# Patient Record
Sex: Female | Born: 1970 | Race: Black or African American | Hispanic: No | Marital: Married | Smoking: Never smoker
Health system: Southern US, Community
[De-identification: ages and names within clinical notes are randomized; demographics above are authoritative.]

## PROBLEM LIST (undated history)

## (undated) DIAGNOSIS — J189 Pneumonia, unspecified organism: Secondary | ICD-10-CM

## (undated) DIAGNOSIS — K220 Achalasia of cardia: Secondary | ICD-10-CM

## (undated) DIAGNOSIS — F329 Major depressive disorder, single episode, unspecified: Secondary | ICD-10-CM

## (undated) DIAGNOSIS — F419 Anxiety disorder, unspecified: Secondary | ICD-10-CM

## (undated) DIAGNOSIS — M6740 Ganglion, unspecified site: Secondary | ICD-10-CM

## (undated) DIAGNOSIS — G43909 Migraine, unspecified, not intractable, without status migrainosus: Secondary | ICD-10-CM

## (undated) DIAGNOSIS — A630 Anogenital (venereal) warts: Secondary | ICD-10-CM

## (undated) DIAGNOSIS — D259 Leiomyoma of uterus, unspecified: Secondary | ICD-10-CM

## (undated) DIAGNOSIS — F32A Depression, unspecified: Secondary | ICD-10-CM

## (undated) DIAGNOSIS — F319 Bipolar disorder, unspecified: Secondary | ICD-10-CM

## (undated) DIAGNOSIS — M51369 Other intervertebral disc degeneration, lumbar region without mention of lumbar back pain or lower extremity pain: Secondary | ICD-10-CM

## (undated) DIAGNOSIS — N939 Abnormal uterine and vaginal bleeding, unspecified: Secondary | ICD-10-CM

## (undated) DIAGNOSIS — A6000 Herpesviral infection of urogenital system, unspecified: Secondary | ICD-10-CM

## (undated) DIAGNOSIS — R7303 Prediabetes: Secondary | ICD-10-CM

## (undated) DIAGNOSIS — A599 Trichomoniasis, unspecified: Secondary | ICD-10-CM

## (undated) DIAGNOSIS — Z21 Asymptomatic human immunodeficiency virus [HIV] infection status: Secondary | ICD-10-CM

## (undated) DIAGNOSIS — G8929 Other chronic pain: Secondary | ICD-10-CM

## (undated) DIAGNOSIS — D241 Benign neoplasm of right breast: Secondary | ICD-10-CM

## (undated) DIAGNOSIS — B20 Human immunodeficiency virus [HIV] disease: Secondary | ICD-10-CM

## (undated) DIAGNOSIS — K759 Inflammatory liver disease, unspecified: Secondary | ICD-10-CM

## (undated) DIAGNOSIS — M549 Dorsalgia, unspecified: Secondary | ICD-10-CM

## (undated) DIAGNOSIS — M5136 Other intervertebral disc degeneration, lumbar region: Secondary | ICD-10-CM

## (undated) DIAGNOSIS — Z973 Presence of spectacles and contact lenses: Secondary | ICD-10-CM

## (undated) DIAGNOSIS — K219 Gastro-esophageal reflux disease without esophagitis: Secondary | ICD-10-CM

## (undated) DIAGNOSIS — R32 Unspecified urinary incontinence: Secondary | ICD-10-CM

## (undated) DIAGNOSIS — M5126 Other intervertebral disc displacement, lumbar region: Secondary | ICD-10-CM

## (undated) DIAGNOSIS — D649 Anemia, unspecified: Secondary | ICD-10-CM

## (undated) HISTORY — DX: Other intervertebral disc degeneration, lumbar region without mention of lumbar back pain or lower extremity pain: M51.369

## (undated) HISTORY — DX: Gastro-esophageal reflux disease without esophagitis: K21.9

## (undated) HISTORY — DX: Pneumonia, unspecified organism: J18.9

## (undated) HISTORY — DX: Anxiety disorder, unspecified: F41.9

## (undated) HISTORY — DX: Other intervertebral disc displacement, lumbar region: M51.26

## (undated) HISTORY — DX: Other intervertebral disc degeneration, lumbar region: M51.36

## (undated) HISTORY — DX: Migraine, unspecified, not intractable, without status migrainosus: G43.909

## (undated) HISTORY — DX: Human immunodeficiency virus (HIV) disease: B20

## (undated) HISTORY — DX: Depression, unspecified: F32.A

## (undated) HISTORY — DX: Asymptomatic human immunodeficiency virus (hiv) infection status: Z21

## (undated) HISTORY — DX: Major depressive disorder, single episode, unspecified: F32.9

## (undated) HISTORY — PX: HERNIA REPAIR: SHX51

## (undated) HISTORY — DX: Anemia, unspecified: D64.9

## (undated) HISTORY — PX: WISDOM TOOTH EXTRACTION: SHX21

## (undated) HISTORY — DX: Other chronic pain: G89.29

## (undated) HISTORY — PX: OTHER SURGICAL HISTORY: SHX169

## (undated) HISTORY — DX: Dorsalgia, unspecified: M54.9

---

## 2004-08-31 ENCOUNTER — Ambulatory Visit: Payer: Self-pay | Admitting: Family Medicine

## 2004-10-05 ENCOUNTER — Ambulatory Visit: Payer: Self-pay | Admitting: Family Medicine

## 2004-11-02 ENCOUNTER — Ambulatory Visit: Payer: Self-pay | Admitting: Family Medicine

## 2004-11-30 ENCOUNTER — Ambulatory Visit: Payer: Self-pay | Admitting: Family Medicine

## 2005-02-01 ENCOUNTER — Ambulatory Visit: Payer: Self-pay | Admitting: Family Medicine

## 2005-02-27 ENCOUNTER — Ambulatory Visit: Payer: Self-pay | Admitting: Internal Medicine

## 2005-04-09 ENCOUNTER — Ambulatory Visit: Payer: Self-pay | Admitting: Family Medicine

## 2005-05-28 ENCOUNTER — Other Ambulatory Visit: Admission: RE | Admit: 2005-05-28 | Discharge: 2005-05-28 | Payer: Self-pay | Admitting: Family Medicine

## 2005-05-28 ENCOUNTER — Ambulatory Visit: Payer: Self-pay | Admitting: Family Medicine

## 2005-06-08 ENCOUNTER — Ambulatory Visit: Payer: Self-pay | Admitting: Family Medicine

## 2005-07-05 ENCOUNTER — Ambulatory Visit: Payer: Self-pay | Admitting: Family Medicine

## 2005-08-16 ENCOUNTER — Ambulatory Visit: Payer: Self-pay | Admitting: Family Medicine

## 2005-08-28 ENCOUNTER — Ambulatory Visit: Payer: Self-pay | Admitting: Family Medicine

## 2005-09-12 ENCOUNTER — Ambulatory Visit: Payer: Self-pay | Admitting: Family Medicine

## 2005-10-02 ENCOUNTER — Ambulatory Visit: Payer: Self-pay | Admitting: Family Medicine

## 2005-10-26 ENCOUNTER — Ambulatory Visit: Payer: Self-pay | Admitting: Family Medicine

## 2005-11-01 ENCOUNTER — Ambulatory Visit: Payer: Self-pay | Admitting: Family Medicine

## 2005-11-09 ENCOUNTER — Ambulatory Visit: Payer: Self-pay | Admitting: Family Medicine

## 2006-01-04 ENCOUNTER — Other Ambulatory Visit: Admission: RE | Admit: 2006-01-04 | Discharge: 2006-01-04 | Payer: Self-pay | Admitting: Family Medicine

## 2006-01-04 ENCOUNTER — Ambulatory Visit: Payer: Self-pay | Admitting: *Deleted

## 2006-01-18 ENCOUNTER — Ambulatory Visit: Payer: Self-pay | Admitting: *Deleted

## 2006-02-01 ENCOUNTER — Ambulatory Visit: Payer: Self-pay | Admitting: *Deleted

## 2006-02-12 ENCOUNTER — Ambulatory Visit: Payer: Self-pay | Admitting: Family Medicine

## 2006-04-23 ENCOUNTER — Ambulatory Visit: Payer: Self-pay | Admitting: Family Medicine

## 2006-04-24 ENCOUNTER — Ambulatory Visit (HOSPITAL_COMMUNITY): Admission: RE | Admit: 2006-04-24 | Discharge: 2006-04-24 | Payer: Self-pay | Admitting: Internal Medicine

## 2006-05-16 ENCOUNTER — Ambulatory Visit: Payer: Self-pay | Admitting: Internal Medicine

## 2006-06-25 ENCOUNTER — Ambulatory Visit: Payer: Self-pay | Admitting: Family Medicine

## 2006-08-22 ENCOUNTER — Encounter: Payer: Self-pay | Admitting: Obstetrics and Gynecology

## 2006-08-22 ENCOUNTER — Ambulatory Visit: Payer: Self-pay | Admitting: *Deleted

## 2007-01-02 ENCOUNTER — Ambulatory Visit: Payer: Self-pay | Admitting: Internal Medicine

## 2007-01-07 ENCOUNTER — Ambulatory Visit (HOSPITAL_COMMUNITY): Admission: RE | Admit: 2007-01-07 | Discharge: 2007-01-07 | Payer: Self-pay | Admitting: Internal Medicine

## 2007-01-30 ENCOUNTER — Ambulatory Visit: Payer: Self-pay | Admitting: Internal Medicine

## 2007-02-12 ENCOUNTER — Encounter: Admission: RE | Admit: 2007-02-12 | Discharge: 2007-05-13 | Payer: Self-pay | Admitting: Internal Medicine

## 2007-03-24 DIAGNOSIS — B20 Human immunodeficiency virus [HIV] disease: Secondary | ICD-10-CM

## 2007-03-24 DIAGNOSIS — J45909 Unspecified asthma, uncomplicated: Secondary | ICD-10-CM | POA: Insufficient documentation

## 2007-03-24 DIAGNOSIS — F411 Generalized anxiety disorder: Secondary | ICD-10-CM

## 2007-03-27 ENCOUNTER — Ambulatory Visit: Payer: Self-pay | Admitting: Internal Medicine

## 2007-04-24 ENCOUNTER — Ambulatory Visit: Payer: Self-pay | Admitting: Internal Medicine

## 2007-10-09 ENCOUNTER — Ambulatory Visit: Payer: Self-pay | Admitting: Internal Medicine

## 2007-10-09 LAB — CONVERTED CEMR LAB
ALT: 14 units/L (ref 0–35)
AST: 18 units/L (ref 0–37)
Absolute CD4: 506 #/uL (ref 381–1469)
Albumin: 4.4 g/dL (ref 3.5–5.2)
Alkaline Phosphatase: 90 units/L (ref 39–117)
BUN: 11 mg/dL (ref 6–23)
Basophils Absolute: 0 10*3/uL (ref 0.0–0.1)
Basophils Relative: 0 % (ref 0–1)
CD4 T Helper %: 25 % — ABNORMAL LOW (ref 32–62)
CO2: 25 meq/L (ref 19–32)
Calcium: 9.2 mg/dL (ref 8.4–10.5)
Chlamydia, Swab/Urine, PCR: NEGATIVE
Chloride: 106 meq/L (ref 96–112)
Creatinine, Ser: 0.94 mg/dL (ref 0.40–1.20)
Eosinophils Absolute: 0.2 10*3/uL (ref 0.0–0.7)
Eosinophils Relative: 3 % (ref 0–5)
GC Probe Amp, Urine: NEGATIVE
Glucose, Bld: 100 mg/dL — ABNORMAL HIGH (ref 70–99)
HCT: 33.5 % — ABNORMAL LOW (ref 36.0–46.0)
HIV 1 RNA Quant: <50 copies/mL (ref <50)
HIV-1 RNA Quant, Log: <1.7 (ref <1.70)
Hemoglobin: 9.9 g/dL — ABNORMAL LOW (ref 12.0–15.0)
Lymphocytes Relative: 44 % (ref 12–46)
Lymphs Abs: 2 10*3/uL (ref 0.7–4.0)
MCHC: 29.6 g/dL — ABNORMAL LOW (ref 30.0–36.0)
MCV: 78.3 fL (ref 78.0–100.0)
Monocytes Absolute: 0.4 10*3/uL (ref 0.1–1.0)
Monocytes Relative: 8 % (ref 3–12)
Neutro Abs: 2 10*3/uL (ref 1.7–7.7)
Neutrophils Relative %: 44 % (ref 43–77)
Platelets: 289 10*3/uL (ref 150–400)
Potassium: 3.7 meq/L (ref 3.5–5.3)
RBC: 4.28 M/uL (ref 3.87–5.11)
RDW: 16.8 % — ABNORMAL HIGH (ref 11.5–15.5)
Sodium: 139 meq/L (ref 135–145)
Total Bilirubin: 1.1 mg/dL (ref 0.3–1.2)
Total Lymphocytes %: 44 % (ref 12–46)
Total Protein: 7.9 g/dL (ref 6.0–8.3)
Total lymphocyte count: 2024 cells/mcL (ref 700–3300)
WBC, lymph enumeration: 4.6 10*3/uL (ref 4.0–10.5)
WBC: 4.6 10*3/uL (ref 4.0–10.5)

## 2007-11-09 ENCOUNTER — Emergency Department (HOSPITAL_COMMUNITY): Admission: EM | Admit: 2007-11-09 | Discharge: 2007-11-09 | Payer: Self-pay | Admitting: Family Medicine

## 2007-11-27 ENCOUNTER — Ambulatory Visit: Payer: Self-pay | Admitting: Internal Medicine

## 2007-11-28 ENCOUNTER — Ambulatory Visit: Payer: Self-pay | Admitting: *Deleted

## 2007-11-30 ENCOUNTER — Emergency Department (HOSPITAL_COMMUNITY): Admission: EM | Admit: 2007-11-30 | Discharge: 2007-11-30 | Payer: Self-pay | Admitting: Family Medicine

## 2008-01-01 ENCOUNTER — Ambulatory Visit: Payer: Self-pay | Admitting: Internal Medicine

## 2008-01-01 ENCOUNTER — Encounter: Payer: Self-pay | Admitting: Internal Medicine

## 2008-01-01 LAB — CONVERTED CEMR LAB
ALT: 14 units/L (ref 0–35)
AST: 19 units/L (ref 0–37)
Absolute CD4: 414 #/uL (ref 381–1469)
Albumin: 4.5 g/dL (ref 3.5–5.2)
Alkaline Phosphatase: 101 units/L (ref 39–117)
BUN: 13 mg/dL (ref 6–23)
Basophils Absolute: 0 10*3/uL (ref 0.0–0.1)
Basophils Relative: 1 % (ref 0–1)
CD4 T Helper %: 31 % — ABNORMAL LOW (ref 32–62)
CO2: 21 meq/L (ref 19–32)
Calcium: 9.8 mg/dL (ref 8.4–10.5)
Chlamydia, Swab/Urine, PCR: NEGATIVE
Chloride: 102 meq/L (ref 96–112)
Creatinine, Ser: 1.03 mg/dL (ref 0.40–1.20)
Eosinophils Absolute: 0.1 10*3/uL (ref 0.0–0.7)
Eosinophils Relative: 1 % (ref 0–5)
GC Probe Amp, Urine: NEGATIVE
Glucose, Bld: 130 mg/dL — ABNORMAL HIGH (ref 70–99)
HCT: 31.8 % — ABNORMAL LOW (ref 36.0–46.0)
HIV 1 RNA Quant: <50 copies/mL (ref <50)
HIV-1 RNA Quant, Log: <1.7 (ref <1.70)
Hemoglobin: 9.1 g/dL — ABNORMAL LOW (ref 12.0–15.0)
Lymphocytes Relative: 29 % (ref 12–46)
Lymphs Abs: 1.3 10*3/uL (ref 0.7–4.0)
MCHC: 28.6 g/dL — ABNORMAL LOW (ref 30.0–36.0)
MCV: 75.5 fL — ABNORMAL LOW (ref 78.0–100.0)
Monocytes Absolute: 0.5 10*3/uL (ref 0.1–1.0)
Monocytes Relative: 10 % (ref 3–12)
Neutro Abs: 2.7 10*3/uL (ref 1.7–7.7)
Neutrophils Relative %: 59 % (ref 43–77)
Platelets: 291 10*3/uL (ref 150–400)
Potassium: 3.7 meq/L (ref 3.5–5.3)
RBC: 4.21 M/uL (ref 3.87–5.11)
RDW: 15.8 % — ABNORMAL HIGH (ref 11.5–15.5)
Sodium: 141 meq/L (ref 135–145)
Total Bilirubin: 2.3 mg/dL — ABNORMAL HIGH (ref 0.3–1.2)
Total Lymphocytes %: 29 % (ref 12–46)
Total Protein: 8.3 g/dL (ref 6.0–8.3)
Total lymphocyte count: 1334 cells/mcL (ref 700–3300)
WBC, lymph enumeration: 4.6 10*3/uL (ref 4.0–10.5)
WBC: 4.6 10*3/uL (ref 4.0–10.5)

## 2008-01-13 ENCOUNTER — Ambulatory Visit: Payer: Self-pay | Admitting: Internal Medicine

## 2008-01-29 ENCOUNTER — Ambulatory Visit: Payer: Self-pay | Admitting: Internal Medicine

## 2008-04-01 ENCOUNTER — Encounter: Payer: Self-pay | Admitting: Internal Medicine

## 2008-04-01 ENCOUNTER — Ambulatory Visit: Payer: Self-pay | Admitting: Internal Medicine

## 2008-04-01 LAB — CONVERTED CEMR LAB
ALT: 11 units/L (ref 0–35)
AST: 19 units/L (ref 0–37)
Absolute CD4: 719 #/uL (ref 381–1469)
Albumin: 4.5 g/dL (ref 3.5–5.2)
Alkaline Phosphatase: 76 units/L (ref 39–117)
BUN: 10 mg/dL (ref 6–23)
Basophils Absolute: 0 10*3/uL (ref 0.0–0.1)
Basophils Relative: 1 % (ref 0–1)
CD4 T Helper %: 29 % — ABNORMAL LOW (ref 32–62)
CO2: 25 meq/L (ref 19–32)
Calcium: 9.4 mg/dL (ref 8.4–10.5)
Chloride: 105 meq/L (ref 96–112)
Creatinine, Ser: 0.57 mg/dL (ref 0.40–1.20)
Eosinophils Absolute: 0.1 10*3/uL (ref 0.0–0.7)
Eosinophils Relative: 2 % (ref 0–5)
Glucose, Bld: 90 mg/dL (ref 70–99)
HCT: 29.9 % — ABNORMAL LOW (ref 36.0–46.0)
HIV 1 RNA Quant: <50 copies/mL (ref <50)
HIV-1 RNA Quant, Log: <1.7 (ref <1.70)
Hemoglobin: 8.5 g/dL — ABNORMAL LOW (ref 12.0–15.0)
Lymphocytes Relative: 42 % (ref 12–46)
Lymphs Abs: 2.5 10*3/uL (ref 0.7–4.0)
MCHC: 28.4 g/dL — ABNORMAL LOW (ref 30.0–36.0)
MCV: 71.9 fL — ABNORMAL LOW (ref 78.0–100.0)
Monocytes Absolute: 0.3 10*3/uL (ref 0.1–1.0)
Monocytes Relative: 5 % (ref 3–12)
Neutro Abs: 3 10*3/uL (ref 1.7–7.7)
Neutrophils Relative %: 50 % (ref 43–77)
Platelets: 328 10*3/uL (ref 150–400)
Potassium: 4.2 meq/L (ref 3.5–5.3)
RBC: 4.16 M/uL (ref 3.87–5.11)
RDW: 18.4 % — ABNORMAL HIGH (ref 11.5–15.5)
Sodium: 142 meq/L (ref 135–145)
Total Bilirubin: 0.6 mg/dL (ref 0.3–1.2)
Total Lymphocytes %: 42 % (ref 12–46)
Total Protein: 7.4 g/dL (ref 6.0–8.3)
Total lymphocyte count: 2478 cells/mcL (ref 700–3300)
WBC, lymph enumeration: 5.9 10*3/uL (ref 4.0–10.5)
WBC: 5.9 10*3/uL (ref 4.0–10.5)

## 2008-06-15 ENCOUNTER — Encounter: Admission: RE | Admit: 2008-06-15 | Discharge: 2008-09-13 | Payer: Self-pay | Admitting: Internal Medicine

## 2008-06-17 ENCOUNTER — Ambulatory Visit: Payer: Self-pay | Admitting: Internal Medicine

## 2008-06-17 ENCOUNTER — Encounter: Payer: Self-pay | Admitting: Internal Medicine

## 2008-06-17 LAB — CONVERTED CEMR LAB
ALT: 13 units/L (ref 0–35)
AST: 16 units/L (ref 0–37)
Absolute CD4: 596 #/uL (ref 381–1469)
Albumin: 4.1 g/dL (ref 3.5–5.2)
Alkaline Phosphatase: 110 units/L (ref 39–117)
BUN: 14 mg/dL (ref 6–23)
Basophils Absolute: 0.1 10*3/uL (ref 0.0–0.1)
Basophils Relative: 1 % (ref 0–1)
CD4 T Helper %: 27 % — ABNORMAL LOW (ref 32–62)
CO2: 22 meq/L (ref 19–32)
Calcium: 8.9 mg/dL (ref 8.4–10.5)
Chloride: 105 meq/L (ref 96–112)
Creatinine, Ser: 1 mg/dL (ref 0.40–1.20)
Eosinophils Absolute: 0.1 10*3/uL (ref 0.0–0.7)
Eosinophils Relative: 2 % (ref 0–5)
Glucose, Bld: 80 mg/dL (ref 70–99)
HCT: 32.9 % — ABNORMAL LOW (ref 36.0–46.0)
HIV 1 RNA Quant: <50 copies/mL (ref <50)
HIV-1 RNA Quant, Log: <1.7 (ref <1.70)
Hemoglobin: 9.5 g/dL — ABNORMAL LOW (ref 12.0–15.0)
Lymphocytes Relative: 48 % — ABNORMAL HIGH (ref 12–46)
Lymphs Abs: 2.2 10*3/uL (ref 0.7–4.0)
MCHC: 28.9 g/dL — ABNORMAL LOW (ref 30.0–36.0)
MCV: 77.2 fL — ABNORMAL LOW (ref 78.0–100.0)
Monocytes Absolute: 0.4 10*3/uL (ref 0.1–1.0)
Monocytes Relative: 8 % (ref 3–12)
Neutro Abs: 1.9 10*3/uL (ref 1.7–7.7)
Neutrophils Relative %: 41 % — ABNORMAL LOW (ref 43–77)
Platelets: 259 10*3/uL (ref 150–400)
Potassium: 4 meq/L (ref 3.5–5.3)
RBC: 4.26 M/uL (ref 3.87–5.11)
RDW: 22.4 % — ABNORMAL HIGH (ref 11.5–15.5)
Sodium: 138 meq/L (ref 135–145)
Total Bilirubin: 0.9 mg/dL (ref 0.3–1.2)
Total Lymphocytes %: 48 % — ABNORMAL HIGH (ref 12–46)
Total Protein: 7.3 g/dL (ref 6.0–8.3)
Total lymphocyte count: 2208 cells/mcL (ref 700–3300)
WBC, lymph enumeration: 4.6 10*3/uL (ref 4.0–10.5)
WBC: 4.6 10*3/uL (ref 4.0–10.5)

## 2008-06-22 ENCOUNTER — Emergency Department (HOSPITAL_COMMUNITY): Admission: EM | Admit: 2008-06-22 | Discharge: 2008-06-22 | Payer: Self-pay | Admitting: Emergency Medicine

## 2008-07-01 ENCOUNTER — Ambulatory Visit: Payer: Self-pay | Admitting: Internal Medicine

## 2008-07-03 ENCOUNTER — Ambulatory Visit (HOSPITAL_COMMUNITY): Admission: RE | Admit: 2008-07-03 | Discharge: 2008-07-03 | Payer: Self-pay | Admitting: Internal Medicine

## 2008-09-20 ENCOUNTER — Ambulatory Visit: Payer: Self-pay | Admitting: Internal Medicine

## 2008-09-21 ENCOUNTER — Encounter: Payer: Self-pay | Admitting: Internal Medicine

## 2008-09-21 LAB — CONVERTED CEMR LAB
ALT: 15 units/L (ref 0–35)
AST: 15 units/L (ref 0–37)
Absolute CD4: 552 #/uL (ref 381–1469)
Albumin: 4.2 g/dL (ref 3.5–5.2)
Alkaline Phosphatase: 117 units/L (ref 39–117)
BUN: 12 mg/dL (ref 6–23)
Basophils Absolute: 0.1 10*3/uL (ref 0.0–0.1)
Basophils Relative: 1 % (ref 0–1)
CD4 T Helper %: 23 % — ABNORMAL LOW (ref 32–62)
CO2: 24 meq/L (ref 19–32)
Calcium: 9.1 mg/dL (ref 8.4–10.5)
Chloride: 105 meq/L (ref 96–112)
Creatinine, Ser: 0.9 mg/dL (ref 0.40–1.20)
Eosinophils Absolute: 0.1 10*3/uL (ref 0.0–0.7)
Eosinophils Relative: 3 % (ref 0–5)
Glucose, Bld: 74 mg/dL (ref 70–99)
HCT: 37.8 % (ref 36.0–46.0)
HIV 1 RNA Quant: <48 copies/mL (ref <48)
HIV-1 RNA Quant, Log: <1.68 (ref <1.68)
Hemoglobin: 11.9 g/dL — ABNORMAL LOW (ref 12.0–15.0)
Lymphocytes Relative: 48 % — ABNORMAL HIGH (ref 12–46)
Lymphs Abs: 2.4 10*3/uL (ref 0.7–4.0)
MCHC: 31.5 g/dL (ref 30.0–36.0)
MCV: 85.9 fL (ref 78.0–100.0)
Monocytes Absolute: 0.4 10*3/uL (ref 0.1–1.0)
Monocytes Relative: 9 % (ref 3–12)
Neutro Abs: 2 10*3/uL (ref 1.7–7.7)
Neutrophils Relative %: 40 % — ABNORMAL LOW (ref 43–77)
Platelets: 278 10*3/uL (ref 150–400)
Potassium: 4.4 meq/L (ref 3.5–5.3)
RBC: 4.4 M/uL (ref 3.87–5.11)
RDW: 19 % — ABNORMAL HIGH (ref 11.5–15.5)
Sodium: 141 meq/L (ref 135–145)
Total Bilirubin: 1.1 mg/dL (ref 0.3–1.2)
Total Lymphocytes %: 48 % — ABNORMAL HIGH (ref 12–46)
Total Protein: 7.6 g/dL (ref 6.0–8.3)
Total lymphocyte count: 2400 cells/mcL (ref 700–3300)
WBC, lymph enumeration: 5 10*3/uL (ref 4.0–10.5)
WBC: 5 10*3/uL (ref 4.0–10.5)

## 2008-10-04 ENCOUNTER — Ambulatory Visit: Payer: Self-pay | Admitting: Internal Medicine

## 2008-10-12 ENCOUNTER — Ambulatory Visit: Payer: Self-pay | Admitting: Internal Medicine

## 2008-10-18 ENCOUNTER — Telehealth: Payer: Self-pay | Admitting: Internal Medicine

## 2008-10-27 ENCOUNTER — Encounter: Payer: Self-pay | Admitting: Internal Medicine

## 2008-10-28 ENCOUNTER — Encounter: Payer: Self-pay | Admitting: Internal Medicine

## 2008-10-28 ENCOUNTER — Ambulatory Visit: Payer: Self-pay | Admitting: Internal Medicine

## 2008-12-09 ENCOUNTER — Ambulatory Visit: Payer: Self-pay | Admitting: Internal Medicine

## 2008-12-09 LAB — CONVERTED CEMR LAB
ALT: 11 units/L (ref 0–35)
AST: 14 units/L (ref 0–37)
Absolute CD4: 644 #/uL (ref 381–1469)
Albumin: 4.4 g/dL (ref 3.5–5.2)
Alkaline Phosphatase: 99 units/L (ref 39–117)
BUN: 19 mg/dL (ref 6–23)
Basophils Absolute: 0 10*3/uL (ref 0.0–0.1)
Basophils Relative: 1 % (ref 0–1)
CD4 T Helper %: 26 % — ABNORMAL LOW (ref 32–62)
CO2: 22 meq/L (ref 19–32)
Calcium: 9.5 mg/dL (ref 8.4–10.5)
Chloride: 105 meq/L (ref 96–112)
Creatinine, Ser: 1.02 mg/dL (ref 0.40–1.20)
Eosinophils Absolute: 0.1 10*3/uL (ref 0.0–0.7)
Eosinophils Relative: 2 % (ref 0–5)
Glucose, Bld: 93 mg/dL (ref 70–99)
HCT: 39.6 % (ref 36.0–46.0)
HIV 1 RNA Quant: <48 copies/mL (ref <48)
HIV-1 RNA Quant, Log: <1.68 (ref <1.68)
Hemoglobin: 12.1 g/dL (ref 12.0–15.0)
Lymphocytes Relative: 42 % (ref 12–46)
Lymphs Abs: 2.5 10*3/uL (ref 0.7–4.0)
MCHC: 30.6 g/dL (ref 30.0–36.0)
MCV: 92.5 fL (ref 78.0–100.0)
Monocytes Absolute: 0.5 10*3/uL (ref 0.1–1.0)
Monocytes Relative: 8 % (ref 3–12)
Neutro Abs: 2.9 10*3/uL (ref 1.7–7.7)
Neutrophils Relative %: 49 % (ref 43–77)
Platelets: 242 10*3/uL (ref 150–400)
Potassium: 4 meq/L (ref 3.5–5.3)
RBC: 4.28 M/uL (ref 3.87–5.11)
RDW: 15.4 % (ref 11.5–15.5)
Sodium: 137 meq/L (ref 135–145)
Total Bilirubin: 1.5 mg/dL — ABNORMAL HIGH (ref 0.3–1.2)
Total Lymphocytes %: 42 % (ref 12–46)
Total Protein: 7.7 g/dL (ref 6.0–8.3)
Total lymphocyte count: 2478 cells/mcL (ref 700–3300)
WBC, lymph enumeration: 5.9 10*3/uL (ref 4.0–10.5)
WBC: 5.9 10*3/uL (ref 4.0–10.5)

## 2008-12-23 ENCOUNTER — Encounter: Payer: Self-pay | Admitting: Internal Medicine

## 2008-12-23 ENCOUNTER — Ambulatory Visit: Payer: Self-pay | Admitting: Internal Medicine

## 2008-12-25 ENCOUNTER — Encounter: Payer: Self-pay | Admitting: Internal Medicine

## 2009-01-14 ENCOUNTER — Encounter: Payer: Self-pay | Admitting: Internal Medicine

## 2009-01-28 ENCOUNTER — Telehealth: Payer: Self-pay | Admitting: Internal Medicine

## 2009-02-10 ENCOUNTER — Ambulatory Visit: Payer: Self-pay | Admitting: Internal Medicine

## 2009-02-10 ENCOUNTER — Encounter: Payer: Self-pay | Admitting: Internal Medicine

## 2009-02-10 DIAGNOSIS — F329 Major depressive disorder, single episode, unspecified: Secondary | ICD-10-CM

## 2009-03-10 ENCOUNTER — Ambulatory Visit: Payer: Self-pay | Admitting: Internal Medicine

## 2009-03-10 ENCOUNTER — Encounter: Payer: Self-pay | Admitting: Internal Medicine

## 2009-03-11 ENCOUNTER — Ambulatory Visit (HOSPITAL_COMMUNITY): Admission: RE | Admit: 2009-03-11 | Discharge: 2009-03-11 | Payer: Self-pay | Admitting: Internal Medicine

## 2009-03-16 ENCOUNTER — Encounter: Payer: Self-pay | Admitting: Internal Medicine

## 2009-03-16 ENCOUNTER — Ambulatory Visit: Payer: Self-pay | Admitting: Internal Medicine

## 2009-03-16 LAB — CONVERTED CEMR LAB
ALT: 17 units/L (ref 0–35)
AST: 19 units/L (ref 0–37)
Absolute CD4: 780 #/uL (ref 381–1469)
Albumin: 4.4 g/dL (ref 3.5–5.2)
Alkaline Phosphatase: 106 units/L (ref 39–117)
BUN: 15 mg/dL (ref 6–23)
Basophils Absolute: 0.1 10*3/uL (ref 0.0–0.1)
Basophils Relative: 1 % (ref 0–1)
CD4 T Helper %: 32 % (ref 32–62)
CO2: 21 meq/L (ref 19–32)
Calcium: 9.4 mg/dL (ref 8.4–10.5)
Chloride: 105 meq/L (ref 96–112)
Creatinine, Ser: 0.96 mg/dL (ref 0.40–1.20)
Eosinophils Absolute: 0.1 10*3/uL (ref 0.0–0.7)
Eosinophils Relative: 2 % (ref 0–5)
Glucose, Bld: 80 mg/dL (ref 70–99)
HCT: 36.3 % (ref 36.0–46.0)
HIV 1 RNA Quant: <48 copies/mL (ref <48)
HIV-1 RNA Quant, Log: <1.68 (ref <1.68)
Hemoglobin: 11.4 g/dL — ABNORMAL LOW (ref 12.0–15.0)
Lymphocytes Relative: 46 % (ref 12–46)
Lymphs Abs: 2.4 10*3/uL (ref 0.7–4.0)
MCHC: 31.4 g/dL (ref 30.0–36.0)
MCV: 92.6 fL (ref 78.0–100.0)
Monocytes Absolute: 0.4 10*3/uL (ref 0.1–1.0)
Monocytes Relative: 7 % (ref 3–12)
Neutro Abs: 2.4 10*3/uL (ref 1.7–7.7)
Neutrophils Relative %: 45 % (ref 43–77)
Platelets: 277 10*3/uL (ref 150–400)
Potassium: 4 meq/L (ref 3.5–5.3)
RBC: 3.92 M/uL (ref 3.87–5.11)
RDW: 14.7 % (ref 11.5–15.5)
Sodium: 141 meq/L (ref 135–145)
Total Bilirubin: 0.9 mg/dL (ref 0.3–1.2)
Total Protein: 7.9 g/dL (ref 6.0–8.3)
Total lymphocyte count: 2438 cells/mcL (ref 700–3300)
WBC: 5.3 10*3/uL (ref 4.0–10.5)

## 2009-03-18 ENCOUNTER — Ambulatory Visit (HOSPITAL_COMMUNITY): Admission: RE | Admit: 2009-03-18 | Discharge: 2009-03-18 | Payer: Self-pay | Admitting: Internal Medicine

## 2009-03-21 ENCOUNTER — Ambulatory Visit: Payer: Self-pay | Admitting: Internal Medicine

## 2009-03-21 ENCOUNTER — Encounter: Payer: Self-pay | Admitting: Internal Medicine

## 2009-03-23 ENCOUNTER — Encounter: Payer: Self-pay | Admitting: Internal Medicine

## 2009-03-31 ENCOUNTER — Ambulatory Visit: Payer: Self-pay | Admitting: Internal Medicine

## 2009-03-31 ENCOUNTER — Encounter: Payer: Self-pay | Admitting: Internal Medicine

## 2009-04-07 ENCOUNTER — Encounter: Payer: Self-pay | Admitting: Internal Medicine

## 2009-04-07 ENCOUNTER — Telehealth: Payer: Self-pay | Admitting: Internal Medicine

## 2009-05-10 ENCOUNTER — Ambulatory Visit: Payer: Self-pay | Admitting: Internal Medicine

## 2009-05-10 ENCOUNTER — Encounter: Payer: Self-pay | Admitting: Internal Medicine

## 2009-05-31 ENCOUNTER — Telehealth: Payer: Self-pay | Admitting: Internal Medicine

## 2009-06-16 ENCOUNTER — Ambulatory Visit: Payer: Self-pay | Admitting: Internal Medicine

## 2009-06-16 LAB — CONVERTED CEMR LAB
ALT: 14 units/L (ref 0–35)
AST: 15 units/L (ref 0–37)
Absolute CD4: 721 #/uL (ref 381–1469)
Albumin: 4 g/dL (ref 3.5–5.2)
Alkaline Phosphatase: 106 units/L (ref 39–117)
BUN: 13 mg/dL (ref 6–23)
Basophils Absolute: 0 10*3/uL (ref 0.0–0.1)
Basophils Relative: 1 % (ref 0–1)
CD4 T Helper %: 28 % — ABNORMAL LOW (ref 32–62)
CO2: 25 meq/L (ref 19–32)
Calcium: 9 mg/dL (ref 8.4–10.5)
Chloride: 105 meq/L (ref 96–112)
Cholesterol: 178 mg/dL (ref 0–200)
Creatinine, Ser: 1.09 mg/dL (ref 0.40–1.20)
Eosinophils Absolute: 0.1 10*3/uL (ref 0.0–0.7)
Eosinophils Relative: 1 % (ref 0–5)
Glucose, Bld: 102 mg/dL — ABNORMAL HIGH (ref 70–99)
HCT: 37 % (ref 36.0–46.0)
HDL: 45 mg/dL (ref >39)
HIV 1 RNA Quant: <48 copies/mL (ref <48)
HIV-1 RNA Quant, Log: <1.68 (ref <1.68)
Hemoglobin: 11.4 g/dL — ABNORMAL LOW (ref 12.0–15.0)
LDL Cholesterol: 104 mg/dL — ABNORMAL HIGH (ref 0–99)
Lymphocytes Relative: 46 % (ref 12–46)
Lymphs Abs: 2.6 10*3/uL (ref 0.7–4.0)
MCHC: 30.8 g/dL (ref 30.0–36.0)
MCV: 93.4 fL (ref 78.0–100.0)
Monocytes Absolute: 0.4 10*3/uL (ref 0.1–1.0)
Monocytes Relative: 7 % (ref 3–12)
Neutro Abs: 2.5 10*3/uL (ref 1.7–7.7)
Neutrophils Relative %: 45 % (ref 43–77)
Platelets: 276 10*3/uL (ref 150–400)
Potassium: 3.7 meq/L (ref 3.5–5.3)
RBC: 3.96 M/uL (ref 3.87–5.11)
RDW: 13.8 % (ref 11.5–15.5)
Sodium: 141 meq/L (ref 135–145)
Total Bilirubin: 0.3 mg/dL (ref 0.3–1.2)
Total CHOL/HDL Ratio: 4
Total Protein: 7.2 g/dL (ref 6.0–8.3)
Total lymphocyte count: 2576 cells/mcL (ref 700–3300)
Triglycerides: 143 mg/dL (ref <150)
VLDL: 29 mg/dL (ref 0–40)
WBC: 5.6 10*3/uL (ref 4.0–10.5)

## 2009-06-30 ENCOUNTER — Encounter: Payer: Self-pay | Admitting: Internal Medicine

## 2009-06-30 ENCOUNTER — Ambulatory Visit: Payer: Self-pay | Admitting: Internal Medicine

## 2009-08-15 ENCOUNTER — Telehealth: Payer: Self-pay | Admitting: Internal Medicine

## 2009-09-13 ENCOUNTER — Encounter: Payer: Self-pay | Admitting: Internal Medicine

## 2009-09-13 ENCOUNTER — Ambulatory Visit: Payer: Self-pay | Admitting: Internal Medicine

## 2009-09-13 LAB — CONVERTED CEMR LAB
ALT: 11 units/L (ref 0–35)
AST: 14 units/L (ref 0–37)
Absolute CD4: 695 #/uL (ref 381–1469)
Albumin: 4 g/dL (ref 3.5–5.2)
Alkaline Phosphatase: 105 units/L (ref 39–117)
BUN: 10 mg/dL (ref 6–23)
Basophils Absolute: 0 10*3/uL (ref 0.0–0.1)
Basophils Relative: 1 % (ref 0–1)
CD4 T Helper %: 29 % — ABNORMAL LOW (ref 32–62)
CO2: 23 meq/L (ref 19–32)
Calcium: 9.2 mg/dL (ref 8.4–10.5)
Chloride: 104 meq/L (ref 96–112)
Creatinine, Ser: 1 mg/dL (ref 0.40–1.20)
Eosinophils Absolute: 0.1 10*3/uL (ref 0.0–0.7)
Eosinophils Relative: 2 % (ref 0–5)
Glucose, Bld: 92 mg/dL (ref 70–99)
HCT: 37.1 % (ref 36.0–46.0)
HIV 1 RNA Quant: 67 copies/mL — ABNORMAL HIGH (ref <48)
HIV-1 RNA Quant, Log: 1.83 — ABNORMAL HIGH (ref <1.68)
Hemoglobin: 11.4 g/dL — ABNORMAL LOW (ref 12.0–15.0)
Lymphocytes Relative: 47 % — ABNORMAL HIGH (ref 12–46)
Lymphs Abs: 2.4 10*3/uL (ref 0.7–4.0)
MCHC: 30.7 g/dL (ref 30.0–36.0)
MCV: 89.2 fL (ref 78.0–100.0)
Monocytes Absolute: 0.3 10*3/uL (ref 0.1–1.0)
Monocytes Relative: 6 % (ref 3–12)
Neutro Abs: 2.3 10*3/uL (ref 1.7–7.7)
Neutrophils Relative %: 45 % (ref 43–77)
Platelets: 279 10*3/uL (ref 150–400)
Potassium: 4 meq/L (ref 3.5–5.3)
RBC: 4.16 M/uL (ref 3.87–5.11)
RDW: 15 % (ref 11.5–15.5)
Sodium: 138 meq/L (ref 135–145)
Total Bilirubin: 1.3 mg/dL — ABNORMAL HIGH (ref 0.3–1.2)
Total Protein: 7.2 g/dL (ref 6.0–8.3)
Total lymphocyte count: 2397 cells/mcL (ref 700–3300)
WBC: 5.1 10*3/uL (ref 4.0–10.5)

## 2009-09-19 ENCOUNTER — Telehealth: Payer: Self-pay | Admitting: Internal Medicine

## 2009-09-26 ENCOUNTER — Encounter: Payer: Self-pay | Admitting: Internal Medicine

## 2009-09-26 ENCOUNTER — Ambulatory Visit: Payer: Self-pay | Admitting: Internal Medicine

## 2009-09-26 LAB — CONVERTED CEMR LAB
Bilirubin Urine: NEGATIVE
Blood in Urine, dipstick: NEGATIVE
Glucose, Urine, Semiquant: NEGATIVE
Ketones, urine, test strip: NEGATIVE
Nitrite: NEGATIVE
Protein, U semiquant: 30
Specific Gravity, Urine: >=1.03
Urobilinogen, UA: 0.2
pH: 6

## 2010-09-18 ENCOUNTER — Ambulatory Visit: Admission: RE | Admit: 2010-09-18 | Payer: Self-pay | Source: Home / Self Care | Admitting: Internal Medicine

## 2010-09-18 ENCOUNTER — Encounter: Payer: Self-pay | Admitting: Internal Medicine

## 2010-09-18 LAB — CONVERTED CEMR LAB
ALT: 14 units/L (ref 0–35)
AST: 15 units/L (ref 0–37)
Albumin: 4.7 g/dL (ref 3.5–5.2)
Alkaline Phosphatase: 122 units/L — ABNORMAL HIGH (ref 39–117)
BUN: 10 mg/dL (ref 6–23)
Basophils Absolute: 0 10*3/uL (ref 0.0–0.1)
Basophils Relative: 0 % (ref 0–1)
Bilirubin Urine: NEGATIVE
Blood, UA: NEGATIVE
CO2: 24 meq/L (ref 19–32)
Calcium: 9.9 mg/dL (ref 8.4–10.5)
Casts: NONE SEEN /lpf
Chlamydia, Swab/Urine, PCR: NEGATIVE
Chloride: 104 meq/L (ref 96–112)
Cholesterol: 197 mg/dL (ref 0–200)
Creatinine, Ser: 1 mg/dL (ref 0.40–1.20)
Crystals: NONE SEEN
Eosinophils Absolute: 0.1 10*3/uL (ref 0.0–0.7)
Eosinophils Relative: 1 % (ref 0–5)
GC Probe Amp, Urine: NEGATIVE
Glucose, Bld: 90 mg/dL (ref 70–99)
HCT: 29.6 % — ABNORMAL LOW (ref 36.0–46.0)
HDL: 51 mg/dL (ref >39)
HIV 1 RNA Quant: <20 copies/mL (ref <20)
HIV-1 RNA Quant, Log: <1.3 (ref <1.30)
Hemoglobin: 8.4 g/dL — ABNORMAL LOW (ref 12.0–15.0)
Ketones, ur: NEGATIVE mg/dL
LDL Cholesterol: 121 mg/dL — ABNORMAL HIGH (ref 0–99)
Leukocytes, UA: NEGATIVE
Lymphocytes Relative: 41 % (ref 12–46)
Lymphs Abs: 2.8 10*3/uL (ref 0.7–4.0)
MCHC: 28.4 g/dL — ABNORMAL LOW (ref 30.0–36.0)
MCV: 71.5 fL — ABNORMAL LOW (ref 78.0–100.0)
Monocytes Absolute: 0.5 10*3/uL (ref 0.1–1.0)
Monocytes Relative: 7 % (ref 3–12)
Neutro Abs: 3.6 10*3/uL (ref 1.7–7.7)
Neutrophils Relative %: 51 % (ref 43–77)
Nitrite: NEGATIVE
Platelets: 381 10*3/uL (ref 150–400)
Potassium: 3.9 meq/L (ref 3.5–5.3)
Protein, ur: NEGATIVE mg/dL
RBC: 4.14 M/uL (ref 3.87–5.11)
RDW: 16.3 % — ABNORMAL HIGH (ref 11.5–15.5)
Sodium: 137 meq/L (ref 135–145)
Specific Gravity, Urine: 1.025 (ref 1.005–1.030)
Total Bilirubin: 1.3 mg/dL — ABNORMAL HIGH (ref 0.3–1.2)
Total CHOL/HDL Ratio: 3.9
Total Protein: 8 g/dL (ref 6.0–8.3)
Triglycerides: 126 mg/dL (ref <150)
Urine Glucose: NEGATIVE mg/dL
Urobilinogen, UA: 0.2 (ref 0.0–1.0)
VLDL: 25 mg/dL (ref 0–40)
WBC: 7 10*3/uL (ref 4.0–10.5)
pH: 5.5 (ref 5.0–8.0)

## 2010-09-25 LAB — T-HELPER CELL (CD4) - (RCID CLINIC ONLY)
CD4 % Helper T Cell: 27 % — ABNORMAL LOW (ref 33–55)
CD4 T Cell Abs: 790 uL (ref 400–2700)

## 2010-10-02 ENCOUNTER — Ambulatory Visit
Admission: RE | Admit: 2010-10-02 | Discharge: 2010-10-02 | Payer: Self-pay | Source: Home / Self Care | Attending: Internal Medicine | Admitting: Internal Medicine

## 2010-10-10 NOTE — Miscellaneous (Signed)
Summary: Office Visit ([facility])    Vital Signs:  Patient profile:   40 year old female Menstrual status:  regular Height:      62 inches (157.48 cm) Weight:      183.01 pounds (83.19 kg) BMI:     33.59 Temp:     98.1 degrees F (36.72 degrees C) oral Pulse rate:   89 / minute Resp:     12 per minute BP sitting:   116 / 72  (left arm) Cuff size:   large  Vitals Entered By: Hoy Morn CMA (September 26, 2009 9:06 AM) CC: pt. presents 6 fu//ckf Is Patient Diabetic? No Pain Assessment Patient in pain? no      Nutritional Status BMI of > 30 = obese  Does patient need assistance? Functional Status Self care Ambulation Normal   CC:  pt. presents 05 fu//ckf.  History of Present Illness: Pt c/o urinary urgency. When she feels like she has to go she has to go right away or she will have an accident.  No burning. She has been taking her meds every day. She is moving back to [place] next month.  Habits & Providers  Alcohol-Tobacco-Diet     Alcohol drinks/day: 0     Tobacco Status: 0never     Passive Smoke Exposure: yes  Current Problems (verified): 1)  Insomnia  (ICD-780.52) 2)  Abdominal Pain Right Upper Quadrant  (ICD-789.01) 3)  Depression  (ICD-311) 4)  Preventive Health Care  (ICD-V70.0) 5)  Low Back Pain, Chronic  (ICD-724.2) 6)  Tonsillitis, Acute  (ICD-463) 7)  Wrist Pain, Right  (ICD-719.43) 8)  HIV Disease  (ICD-042) 9)  Asthma  (ICD-493.90) 10)  Anxiety  (ICD-300.00)  Current Medications (verified): 1)  Truvada 200-300 Mg Tabs (Emtricitabine-Tenofovir) .... Take 1 Tablet By Mouth Once A Day 2)  Reyataz 300 Mg  Caps (Atazanavir Sulfate) .... Take 1 Tablet By Mouth Once A Day 3)  Norvir 100 Mg Tablets .... Take 1 Tablet By Mouth Once A Day 4)  Albuterol 90 Mcg/act  Aers (Albuterol) .... One Puff Q6 As Needed 5)  Flexeril 10 Mg Tabs (Cyclobenzaprine Hcl) .... One Tab Q 8 Hrs As Needed 6)  Ferro-Bob 325 (65 Fe) Mg Tabs (Ferrous Sulfate) .... One  Tab Three Times A Day 7)  Tramadol Hcl 50 Mg Tabs (Tramadol Hcl) .... One Tab Q 8 Hrs As Needed 8)  Lyrica 50 Mg Caps (Pregabalin) .... One Tab Three Times A Day 9)  Valtrex 1 Gm Tabs (Valacyclovir Hcl) .... One Tab By Mouth Two Times A Day For 7 Dyas 10)  Diclofenac Sodium Ec 75 Mg Tbec .... Take One Tablet By Mouth Twice Daily With Meals 11)  Vicodin 5-500 Mg Tabs (Hydrocodone-Acetaminophen) .... Take 1 Tablet By Mouth Every 6 Hours As Needed 12)  Zoloft 50 Mg Tabs (Sertraline Hcl) .... Take 1 Tablet By Mouth Once A Day 13)  Pepcid 20 Mg Tabs (Famotidine) .... Take 1 Tablet By Mouth Once A Day 14)  Trazodone Hcl 50 Mg Tabs (Trazodone Hcl) .... Take 1 Tablet By Mouth At Bedtime As Needed  Allergies: No Known Drug Allergies   Social History: Tobacco Use:  no Residence:  rarely  Review of Systems  The patient denies anorexia, fever, and weight loss.     Physical Exam  General:  alert, well-developed, well-nourished, and well-hydrated.   Head:  normocephalic and atraumatic.   Mouth:  pharynx pink and moist.   Lungs:  normal breath sounds.  Impression & Recommendations:  Problem # 1:  HIV DISEASE (ICD-042) Pt to continue current meds.  She should meet with her [facility] case manager to get a list of HIV care providers available in [place]. She is encouraged to make sure she has at least a month supple of her medications prior to her move. Diagnostics Reviewed:  HIV: HIV positive - not AIDS (10/28/2008)   WBC: 5.1 (09/13/2009)   Hgb: 11.4 (09/13/2009)   HCT: 37.1 (09/13/2009)   Platelets: 279 (09/13/2009) HIV-1 RNA: 67 (09/13/2009)     Orders: Est. Patient Level III (16109)  Problem # 2:  URINARY URGENCY (UEA-540.98) will check a UA. UA negative. Most likely overactive bladder patient does not wish to have treatment at this time Orders: T-Urinalysis (11914-78295)  Complete Medication List: 1)  Truvada 200-300 Mg Tabs (Emtricitabine-tenofovir) .... Take 1 tablet by mouth once a  day 2)  Reyataz 300 Mg Caps (Atazanavir sulfate) .... Take 1 tablet by mouth once a day 3)  Norvir 100 Mg Tablets  .... Take 1 tablet by mouth once a day 4)  Albuterol 90 Mcg/act Aers (Albuterol) .... One puff q6 as needed 5)  Flexeril 10 Mg Tabs (Cyclobenzaprine hcl) .... One tab q 8 hrs as needed 6)  Ferro-bob 325 (65 Fe) Mg Tabs (Ferrous sulfate) .... One tab three times a day 7)  Tramadol Hcl 50 Mg Tabs (Tramadol hcl) .... One tab q 8 hrs as needed 8)  Lyrica 50 Mg Caps (Pregabalin) .... One tab three times a day 9)  Valtrex 1 Gm Tabs (Valacyclovir hcl) .... One tab by mouth two times a day for 7 dyas 10)  Diclofenac Sodium Ec 75 Mg Tbec  .... Take one tablet by mouth twice daily with meals 11)  Vicodin 5-500 Mg Tabs (Hydrocodone-acetaminophen) .... Take 1 tablet by mouth every 6 hours as needed 12)  Zoloft 50 Mg Tabs (Sertraline hcl) .... Take 1 tablet by mouth once a day 13)  Pepcid 20 Mg Tabs (Famotidine) .... Take 1 tablet by mouth once a day 14)  Trazodone Hcl 50 Mg Tabs (Trazodone hcl) .... Take 1 tablet by mouth at bedtime as needed    Laboratory Results   Urine Tests  Date/Time Received: 09/26/2009/0915 Date/Time Reported: 09/26/2009/0915  Routine Urinalysis   Color: yellow Appearance: Clear Glucose: negative   (Normal Range: Negative) Bilirubin: negative   (Normal Range: Negative) Ketone: negative   (Normal Range: Negative) Spec. Gravity: >=1.030   (Normal Range: 1.003-1.035) Blood: negative   (Normal Range: Negative) pH: 6.0   (Normal Range: 5.0-8.0) Protein: 30   (Normal Range: Negative) Urobilinogen: 0.2   (Normal Range: 0-1) Nitrite: negative   (Normal Range: Negative) Leukocyte Esterace: trace   (Normal Range: Negative)

## 2010-10-10 NOTE — Progress Notes (Signed)
Summary: phone call  Phone Note Call from Patient   Caller: Patient Call For: nurse/ MD Summary of Call: Patient called on 09/16/09 reporting abd pain and diarrhea after drinking milk. Pt. also reports occasional migraines that travel from her head and down her back. Patient wants to know if needs a referral to a back MD. Patient advised to make an appt. with Dr. Philipp Deputy to discuss her symptoms.  Initial call taken by: Sharen Heck RN,  September 19, 2009 4:57 PM

## 2010-10-12 ENCOUNTER — Encounter (INDEPENDENT_AMBULATORY_CARE_PROVIDER_SITE_OTHER): Payer: Self-pay | Admitting: *Deleted

## 2010-10-12 NOTE — Assessment & Plan Note (Addendum)
Summary: CHECKUP LAST SEEN 09/26/09 [MKJ]   CC:  pt. to reestablish, lab results, pt. has moved back from [place], and was seeing ID Dr. there.  History of Present Illness: 40 yo F with HIV+ since Dx 1999. Has been living in [place] for last yr. Has been taking meds well. Has been feeling well. CD4 790 Vl <20 ( 09-18-10). Needs meds refilled, was on ADAP here previously.   Preventive Screening-Counseling & Management  Alcohol-Tobacco     Alcohol drinks/day: 0     Smoking Status: never     Passive Smoke Exposure: yes  Caffeine-Diet-Exercise     Caffeine use/day: occasional soda     Does Patient Exercise: no  Hep-HIV-STD-Contraception     HIV Risk: no     Sun Exposure-Excessive: rarely  Safety-Violence-Falls     Seat Belt Use: 100      Sexual History:  currently monogamous.        Drug Use:  no.    Comments: pt. given condoms   Updated Prior Medication List: TRUVADA 200-300 MG TABS (EMTRICITABINE-TENOFOVIR) Take 1 tablet by mouth once a day REYATAZ 300 MG  CAPS (ATAZANAVIR SULFATE) Take 1 tablet by mouth once a day * NORVIR 100 MG TABLETS Take 1 tablet by mouth once a day ALBUTEROL 90 MCG/ACT  AERS (ALBUTEROL) one puff q6 as needed FERRO-BOB 325 (65 FE) MG TABS (FERROUS SULFATE) one tab three times a day TRAMADOL HCL 50 MG TABS (TRAMADOL HCL) one tab q 8 hrs as needed VALTREX 1 GM TABS (VALACYCLOVIR HCL) one tab by mouth two times a day for 7 dyas * DICLOFENAC SODIUM EC 75 MG TBEC Take one tablet by mouth twice daily with meals VICODIN 5-500 MG TABS (HYDROCODONE-ACETAMINOPHEN) Take 1 tablet by mouth every 6 hours as needed PEPCID 20 MG TABS (FAMOTIDINE) Take 1 tablet by mouth once a day TRAZODONE HCL 50 MG TABS (TRAZODONE HCL) Take 1 tablet by mouth at bedtime as needed  Current Allergies (reviewed today): No known allergies  Family History: Family History of Colon CA 1st degree relative <60 (father , age ?).  Family History Diabetes 1st degree relative Family History High  cholesterol  Social History: Married, husband negative.  Never Smoked Alcohol use-no Sexual History:  currently monogamous  Review of Systems       eating well, moving bowels well, passes urine well, wt is down 4#. c/o fevers in her back.   Vital Signs:  Patient profile:   40 year old female Menstrual status:  regular Height:      62 inches (157.48 cm) Weight:      178.8 pounds (81.27 kg) BMI:     32.82 Temp:     98.6 degrees F (37.00 degrees C) oral Pulse rate:   106 / minute BP sitting:   139 / 73  (right arm)  Vitals Entered By: Wendall Mola CMA Duncan Dull) (October 02, 2010 9:52 AM) CC: pt. to reestablish, lab results, pt. has moved back from [place], was seeing ID Dr. there Is Patient Diabetic? No Pain Assessment Patient in pain? yes     Location: back Intensity: 5 Type: aching Onset of pain  Constant Nutritional Status BMI of > 30 = obese Nutritional Status Detail appetite "good"  Have you ever been in a relationship where you felt threatened, hurt or afraid?No   Does patient need assistance? Functional Status Self care Ambulation Normal Comments no missed doses of HAART meds in last 3 months   Physical Exam  General:  well-developed, well-nourished, and well-hydrated.   Eyes:  pupils equal, pupils round, and pupils reactive to light.  pupils equal, pupils round, and pupils reactive to light.   Mouth:  pharynx pink and moist and no exudates.  pharynx pink and moist and no exudates.   Neck:  no masses.  no masses.   Lungs:  normal respiratory effort and normal breath sounds.  normal respiratory effort and normal breath sounds.   Heart:  normal rate, regular rhythm, and no murmur.   Abdomen:  soft, non-tender, and normal bowel sounds.  soft, non-tender, and normal bowel sounds.     Impression & Recommendations:  Problem # 1:  HIV DISEASE (ICD-042)  she is doing well. will cont her current meds. given condoms. instructed to space zantac and ATV by 12  hours. return to clinic 4 months Her updated medication list for this problem includes:    Valtrex 1 Gm Tabs (Valacyclovir hcl) ..... One tab by mouth two times a day for 7 dyas  Her updated medication list for this problem includes:    Valtrex 1 Gm Tabs (Valacyclovir hcl) ..... One tab by mouth two times a day for 7 dyas  Future Orders: T-Hepatitis B Surface Antigen ([id number]) ... 12/31/2010 T-Hepatitis B Surface Antibody [phone]) ... 12/31/2010 T-Hepatitis B Core Antibody ([id number]) ... 12/31/2010 T-Hepatitis C Antibody ([id number]) ... 12/31/2010  Problem # 2:  PREVENTIVE HEALTH CARE (ICD-V70.0) will shcedule for Colonoscopy at 40 yo.   Medications Added to Medication List This Visit: 1)  Tramadol Hcl 50 Mg Tabs (Tramadol hcl) .... One tab q 8 hrs as needed 2)  Zantac 300 Mg Tabs (Ranitidine hcl) .... Take 1 tablet by mouth once a day  Other Orders: TB Skin Test (44010) Admin 1st Vaccine (27253) Est. Patient Level IV (66440) Future Orders: T-CD4SP ([facility]) (CD4SP) ... 12/31/2010 T-HIV Viral Load ([phone]) ... 12/31/2010 T-Comprehensive Metabolic Panel [phone]) ... 12/31/2010 T-CBC w/Diff ([id number]) ... 12/31/2010  Prescriptions: ZANTAC 300 MG TABS (RANITIDINE HCL) Take 1 tablet by mouth once a day  #90 x 3   Entered and Authorized by:   Johny Sax MD   Signed by:   Johny Sax MD on 10/02/2010   Method used:   Print then Give to Patient   RxID:   [id number] TRAMADOL HCL 50 MG TABS (TRAMADOL HCL) one tab q 8 hrs as needed  #50 x 0   Entered and Authorized by:   Johny Sax MD   Signed by:   Johny Sax MD on 10/02/2010   Method used:   Print then Give to Patient   RxID:   [id number] FERRO-BOB 325 (65 FE) MG TABS (FERROUS SULFATE) one tab three times a day  #90 x 12   Entered and Authorized by:   Johny Sax MD   Signed by:   Johny Sax MD on 10/02/2010   Method used:   Print then Give to Patient   RxID:    [id number] NORVIR 100 MG TABLETS Take 1 tablet by mouth once a day  #90 x 3   Entered and Authorized by:   Johny Sax MD   Signed by:   Johny Sax MD on 10/02/2010   Method used:   Print then Give to Patient   RxID:   [id number] REYATAZ 300 MG  CAPS (ATAZANAVIR SULFATE) Take 1 tablet by mouth once a day  #90 x 3   Entered and Authorized by:   Johny Sax MD   Signed by:   Johny Sax  MD on 10/02/2010   Method used:   Print then Give to Patient   RxID:   [id number] TRUVADA 200-300 MG TABS (EMTRICITABINE-TENOFOVIR) Take 1 tablet by mouth once a day  #90 x 3   Entered and Authorized by:   Johny Sax MD   Signed by:   Johny Sax MD on 10/02/2010   Method used:   Print then Give to Patient   RxID:   [id number]         Medication Adherence: 10/02/2010   Adherence to medications reviewed with patient. Counseling to provide adequate adherence provided   Prevention For Positives: 10/02/2010   Safe sex practices discussed with patient. Condoms offered.                              Immunization History:  Influenza Immunization History:    Influenza:  historical (07/11/2010)  Immunizations Administered:  PPD Skin Test:    Vaccine Type: PPD    Site: right forearm    Mfr: Sanofi Pasteur    Dose: 0.1 ml    Route: ID    Given by: Wendall Mola CMA ( AAMA)    Exp. Date: 03/16/2012    Lot #: [id number]   Appended Document: CHECKUP LAST SEEN 09/26/09 + PPD negative   PPD Results    Date of reading: 10/04/2010    Results: < 5mm    Interpretation: negative

## 2010-10-12 NOTE — Miscellaneous (Signed)
Summary: Lab orders  Clinical Lists Changes  Orders: Added new Test order of T-Chlamydia  Probe, urine ([id number]) - Signed Added new Test order of T-CBC w/Diff (85025-10010) - Signed Added new Test order of T-CD4SP ([facility]) (CD4SP) - Signed Added new Test order of T-GC Probe, urine ([id number]) - Signed Added new Test order of T-Comprehensive Metabolic Panel (80053-22900) - Signed Added new Test order of T-HIV Viral Load (87536-83319) - Signed Added new Test order of T-Lipid Profile (80061-22930) - Signed Added new Test order of T-RPR (Syphilis) (86592-23940) - Signed Added new Test order of T-Urinalysis (81003-65000) - Signed 

## 2010-10-12 NOTE — Miscellaneous (Signed)
Summary: [facility]  [facility]   Imported By: Florinda Marker 09/26/2010 09:57:16  _____________________________________________________________________  External Attachment:    Type:   Image     Comment:   External Document

## 2010-10-18 NOTE — Miscellaneous (Signed)
Summary: Problem list update  Clinical Lists Changes  Problems: Added new problem of LONG-TERM (CURRENT) USE OF OTHER MEDICATIONS (ICD-V58.69)

## 2010-10-23 ENCOUNTER — Encounter: Payer: Self-pay | Admitting: Infectious Diseases

## 2010-11-01 NOTE — Consult Note (Signed)
[facility]   Imported By: Florinda Marker 10/23/2010 18:39:04  _____________________________________________________________________  External Attachment:    Type:   Image     Comment:   External Document

## 2011-01-26 NOTE — Group Therapy Note (Signed)
NAME:  Danielle [name], Danielle [name] NO.:  [id number]   MEDICAL RECORD NO.:  [id number]          PATIENT TYPE:  WOC   LOCATION:  [facility]                   FACILITY:  [facility]   PHYSICIAN:  Tinnie Gens, MD        DATE OF BIRTH:  1971/01/18   DATE OF SERVICE:  11/09/2005                                    CLINIC NOTE   CHIEF COMPLAINT:  Followup condyloma.   HISTORY OF PRESENT ILLNESS:  Patient is a 40 year old HIV-positive patient,  who was treated for a condyloma.  The patient was previously treated on  February 16 and returns today for another round of treatment, and she thinks  they might be somewhat improved.   The patient also complains of heavy urinary frequency and a low back pain.  She denies fevers, chills, nausea or vomiting.  She denies significant  dysuria.  She does complain of nocturia.   EXAMINATION:  The patient has a condyloma associated with an external  hemorrhoid on the left side.  There is also a small kissing condyloma  adjacent to this perianally.  There are two larger condyloma on the right  side and 1 kissing lesion there as well.  There is 1 small condyloma in the  posterior fourchette.  The patient had no CVA tenderness.   PROCEDURE:  Condyloma were treated with PCA 80% after the adjacent skin was  covered with KY jelly.  Urinalysis was pending.   IMPRESSION:  1.  Multiple condyloma.  2.  Human immunodeficiency virus.  3.  Urinary frequency.   PLAN:  1.  We will send a UA today and call in any prescription as needed for that.  2.  The patient will return in 2 weeks for a reassessment of her condyloma.      She is already scheduled for a colposcopy at that visit, so these things      can be done at the same time.           ______________________________  Tinnie Gens, MD     TP/MEDQ  D:  11/09/2005  T:  11/10/2005  Job:  [id number]

## 2011-01-26 NOTE — Group Therapy Note (Signed)
NAME:  Danielle [name], NORDHOFF NO.:  [id number]   MEDICAL RECORD NO.:  [id number]          PATIENT TYPE:  WOC   LOCATION:  [facility]                   FACILITY:  [facility]   PHYSICIAN:  Tinnie Gens, MD        DATE OF BIRTH:  [birth year]   DATE OF SERVICE:                                    CLINIC NOTE   CHIEF COMPLAINT:  Referral for genital warts.   HISTORY OF PRESENT ILLNESS:  Patient is a 40 year old gravida 3, para 3-0-0-  3, who is referred from [facility] on [address].  She is  referred for 2 problems.  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  The patient is here today for treatment of  condyloma.  She is without significant symptoms.  Condyloma have been  present for a while.   PAST SURGICAL HISTORY:  Significant for asthma, a history of pneumonia and  HIV disease.   PAST SURGICAL HISTORY:  BTL.   ALLERGIES:  NONE KNOWN.   MEDICATIONS:  Paxil, Singulair, Advair, Reyataz and Truvada.   OBSTETRIC HISTORY:  She is a G3, P3 with 2 C-sections.   GYNECOLOGIC HISTORY:  She has cycles once a month and they last for 7 days,  they are painful and heavy.  She is status post tubal ligation.  She does  have a history of abnormal Pap.   FAMILY HISTORY:  Significant for diabetes, hypertension, colon cancer and  asthma.   SOCIAL HISTORY:  No tobacco, alcohol or drug use.   REVIEW OF SYSTEMS:  A 14-point review of systems is reviewed.  Please see  GYN history in the chart.  Significant for numbness and weakness in her  fingers, weight gain, headaches and vaginal odor.  All of the problems  except the vaginal stuff is dealt with by [facility].   PHYSICAL EXAMINATION:  VITAL SIGNS:  On exam today, her weight is 151.2.  GENERAL: She is a well-developed, well-nourished black female in no acute  distress.  ABDOMEN:  Soft, nontender and  nondistended.  GENITOURINARY:  Externally, the patient has multiple condyloma.  She has 1  at the posterior fourchette and 1 on the labia menorah on the left side, and  then she has multiple perianal condyloma.  BUS is normal.  The vagina is  pink and rugated.   PROCEDURE:  The area surrounding condyloma:  KY jelly was liberally applied  to these areas for protection and 80% PCA was applied to the condyloma until  a white appearance of the condyloma was noted.  The patient tolerated the  procedure well.   IMPRESSION:  1.  Multiple condyloma.  2.  History of abnormal Pap smear.  3.  Human immunodeficiency virus.  4.  Significant asthma.   PLAN:  Status post treatment condyloma today.  We will repeat treatment in 2  weeks.  We will schedule the patient back for a colposcopy.           ______________________________  Tinnie Gens,  MD     TP/MEDQ  D:  10/26/2005  T:  10/26/2005  Job:  [id number]   cc:   [facility]  [address]  [place], [place] [address]

## 2011-01-26 NOTE — Group Therapy Note (Signed)
NAME:  Danielle [name], Danielle [name] NO.:  [id number]   MEDICAL RECORD NO.:  [id number]          PATIENT TYPE:  WOC   LOCATION:  [facility]                   FACILITY:  [facility]   PHYSICIAN:  Carolanne Grumbling, M.D.   DATE OF BIRTH:  12/20/1970   DATE OF SERVICE:                                  CLINIC NOTE   A 40 year old G 3, P 3-0-0-3 here for repeat Pap smear.  The patient has  a history of vein pathology, biopsy in April of 2007.  Repeat exam in  May of 2007 was __________ and no lesions were seen.  Biopsy site was  well-healed.  The patient denies any problems.  Does have HIV and is on  medications for that.   PHYSICAL EXAM:  VITAL SIGNS:  98.9, pulse 79, blood pressure 98/53,  weight 71 kg, height 5 feet 1 inches.  This is a well-developed, well-nourished female in no acute distress.  GU:  __________ condyloma are present.  Vagina pink, rugae.  Cervix is  multiparas.  Pap smear done.  Acetic acid in place.  No lesions were  done and noted.  Bimanual exam revealed a small anteverted uterus.  Adnexa free of masses.   ASSESSMENT AND PLAN:  58. A 40 year old G 3, P 3-0-0-3 with human immunodeficiency virus and      history of VAIN syndrome.  Plan:  Pap smear done today.  If normal,      will repeat Pap smear in 6 months.  She is discussed with Dr. Okey Dupre,      and because the patient does have human immunodeficiency virus she      will need Pap smears every 6 months.           ______________________________  Carolanne Grumbling, M.D.     TW/MEDQ  D:  08/22/2006  T:  08/22/2006  Job:  [id number]

## 2011-01-30 ENCOUNTER — Other Ambulatory Visit (INDEPENDENT_AMBULATORY_CARE_PROVIDER_SITE_OTHER): Payer: Medicare Other

## 2011-01-30 DIAGNOSIS — Z113 Encounter for screening for infections with a predominantly sexual mode of transmission: Secondary | ICD-10-CM

## 2011-01-30 DIAGNOSIS — R5383 Other fatigue: Secondary | ICD-10-CM

## 2011-01-30 DIAGNOSIS — Z114 Encounter for screening for human immunodeficiency virus [HIV]: Secondary | ICD-10-CM

## 2011-01-30 DIAGNOSIS — B20 Human immunodeficiency virus [HIV] disease: Secondary | ICD-10-CM

## 2011-01-30 DIAGNOSIS — R945 Abnormal results of liver function studies: Secondary | ICD-10-CM

## 2011-01-31 LAB — CBC WITH DIFFERENTIAL/PLATELET
Basophils Absolute: 0.1 10*3/uL (ref 0.0–0.1)
Basophils Relative: 1 % (ref 0–1)
Eosinophils Absolute: 0.1 10*3/uL (ref 0.0–0.7)
Eosinophils Relative: 2 % (ref 0–5)
HCT: 28 % — ABNORMAL LOW (ref 36.0–46.0)
Hemoglobin: 7.9 g/dL — ABNORMAL LOW (ref 12.0–15.0)
Lymphocytes Relative: 40 % (ref 12–46)
Lymphs Abs: 2.2 10*3/uL (ref 0.7–4.0)
MCH: 19.1 pg — ABNORMAL LOW (ref 26.0–34.0)
MCHC: 28.2 g/dL — ABNORMAL LOW (ref 30.0–36.0)
MCV: 67.8 fL — ABNORMAL LOW (ref 78.0–100.0)
Monocytes Absolute: 0.4 10*3/uL (ref 0.1–1.0)
Monocytes Relative: 8 % (ref 3–12)
Neutro Abs: 2.7 10*3/uL (ref 1.7–7.7)
Neutrophils Relative %: 49 % (ref 43–77)
Platelets: 339 10*3/uL (ref 150–400)
RBC: 4.13 MIL/uL (ref 3.87–5.11)
RDW: 18.5 % — ABNORMAL HIGH (ref 11.5–15.5)
WBC: 5.5 10*3/uL (ref 4.0–10.5)

## 2011-01-31 LAB — COMPREHENSIVE METABOLIC PANEL
ALT: 9 U/L (ref 0–35)
AST: 14 U/L (ref 0–37)
Albumin: 4.1 g/dL (ref 3.5–5.2)
Alkaline Phosphatase: 113 U/L (ref 39–117)
BUN: 10 mg/dL (ref 6–23)
CO2: 24 mEq/L (ref 19–32)
Calcium: 9.2 mg/dL (ref 8.4–10.5)
Chloride: 105 mEq/L (ref 96–112)
Creat: 1.05 mg/dL (ref 0.40–1.20)
Glucose, Bld: 95 mg/dL (ref 70–99)
Potassium: 4.1 mEq/L (ref 3.5–5.3)
Sodium: 139 mEq/L (ref 135–145)
Total Bilirubin: 1.4 mg/dL — ABNORMAL HIGH (ref 0.3–1.2)
Total Protein: 6.9 g/dL (ref 6.0–8.3)

## 2011-01-31 LAB — HEPATITIS B SURFACE ANTIBODY,QUALITATIVE: Hep B S Ab: POSITIVE — AB

## 2011-01-31 LAB — T-HELPER CELL (CD4) - (RCID CLINIC ONLY)
CD4 % Helper T Cell: 27 % — ABNORMAL LOW (ref 33–55)
CD4 T Cell Abs: 620 uL (ref 400–2700)

## 2011-01-31 LAB — HEPATITIS B SURFACE ANTIGEN: Hepatitis B Surface Ag: NEGATIVE

## 2011-01-31 LAB — HEPATITIS C ANTIBODY: HCV Ab: NEGATIVE

## 2011-01-31 LAB — HIV-1 RNA QUANT-NO REFLEX-BLD
HIV 1 RNA Quant: <20 copies/mL (ref <20)
HIV-1 RNA Quant, Log: <1.3 {Log} (ref <1.30)

## 2011-02-01 LAB — HEPATITIS B CORE ANTIBODY, IGM: Hep B C IgM: NEGATIVE

## 2011-02-13 ENCOUNTER — Ambulatory Visit: Payer: Self-pay | Admitting: Infectious Diseases

## 2011-02-21 ENCOUNTER — Encounter: Payer: Self-pay | Admitting: Infectious Diseases

## 2011-02-21 ENCOUNTER — Ambulatory Visit (INDEPENDENT_AMBULATORY_CARE_PROVIDER_SITE_OTHER): Payer: Medicare Other | Admitting: Infectious Diseases

## 2011-02-21 VITALS — BP 124/67 | HR 93 | Temp 99.1°F | Ht 62.0 in | Wt 172.0 lb

## 2011-02-21 DIAGNOSIS — Z79899 Other long term (current) drug therapy: Secondary | ICD-10-CM

## 2011-02-21 DIAGNOSIS — Z113 Encounter for screening for infections with a predominantly sexual mode of transmission: Secondary | ICD-10-CM

## 2011-02-21 DIAGNOSIS — Z23 Encounter for immunization: Secondary | ICD-10-CM

## 2011-02-21 DIAGNOSIS — R3915 Urgency of urination: Secondary | ICD-10-CM

## 2011-02-21 DIAGNOSIS — B20 Human immunodeficiency virus [HIV] disease: Secondary | ICD-10-CM

## 2011-02-21 DIAGNOSIS — M545 Low back pain: Secondary | ICD-10-CM

## 2011-02-21 NOTE — Assessment & Plan Note (Signed)
Will set her up to see GYN for eval as well as for PAP.

## 2011-02-21 NOTE — Progress Notes (Signed)
  Subjective:    Patient ID: Danielle [name], female    DOB: 1970-12-04, 40 y.o.   MRN: [id number]  HPI 40 yo F with HIV+ since Dx 1999. Has been living in [place] for last yr. Has been taking meds well. Has been feeling well. CD4 620 Vl <20 (01-30-11). Taking ATVr/TRV. Thinks she has a weak bladder- has to go to the bathroom every 5 minutes. Has had incontanence. No dysuria, no urinary d/c. Last PAP was ?    Review of Systems  Constitutional: Negative for appetite change and unexpected weight change.  Gastrointestinal: Negative for diarrhea and constipation.  Genitourinary: Positive for frequency and difficulty urinating. Negative for dysuria.  Neurological: Positive for headaches.       Objective:   Physical Exam  Constitutional: She appears well-developed and well-nourished.  Eyes: EOM are normal. Pupils are equal, round, and reactive to light.  Neck: Neck supple.  Cardiovascular: Normal rate, regular rhythm and normal heart sounds.   Pulmonary/Chest: Effort normal and breath sounds normal.  Abdominal: Soft. Bowel sounds are normal. She exhibits no distension. There is no tenderness.  Musculoskeletal:       Right shoulder: She exhibits normal range of motion, no tenderness and no bony tenderness.       Right elbow: She exhibits normal range of motion and no swelling. no tenderness found.       Right wrist: She exhibits normal range of motion, no tenderness and no swelling.          Assessment & Plan:

## 2011-02-21 NOTE — Assessment & Plan Note (Signed)
She appears to be doing well. Offered condoms. Will continue her current meds, some concern about concurrent use of H2. See her back in 4 months with labs prior.

## 2011-02-21 NOTE — Assessment & Plan Note (Signed)
May need to consider repeating her MRI of her C and T/L spine. She will continue her current rx. She has previously gotten cortisone injection in her spine without result.

## 2011-03-08 ENCOUNTER — Telehealth: Payer: Self-pay | Admitting: *Deleted

## 2011-03-08 NOTE — Telephone Encounter (Signed)
Called patient and notified her of appointment at [facility] for 05/09/11 at 2:15.  Patient given address for clinic. Wendall Mola CMA

## 2011-03-16 ENCOUNTER — Other Ambulatory Visit: Payer: Medicare Other | Admitting: Infectious Diseases

## 2011-03-16 ENCOUNTER — Other Ambulatory Visit (HOSPITAL_COMMUNITY)
Admission: RE | Admit: 2011-03-16 | Discharge: 2011-03-16 | Disposition: A | Discharge status: Home / Self Care | Payer: Medicare Other | Source: Ambulatory Visit | Attending: Infectious Diseases | Admitting: Infectious Diseases

## 2011-03-16 DIAGNOSIS — Z01419 Encounter for gynecological examination (general) (routine) without abnormal findings: Secondary | ICD-10-CM | POA: Insufficient documentation

## 2011-03-16 DIAGNOSIS — Z124 Encounter for screening for malignant neoplasm of cervix: Secondary | ICD-10-CM

## 2011-03-16 NOTE — Patient Instructions (Signed)
  Your results will be ready in about a week.  Thank you for coming to the Center for your care.  Have a great summer.

## 2011-03-16 NOTE — Progress Notes (Signed)
  Subjective:     KYRAN WHITTIER is a 40 y.o. woman who comes in today for a  pap smear only.  Previous abnormal Pap smear:  No. Contraception:  BTL    Review of Systems   Objective:    LMP 02/06/2011 Pelvic Exam: . Pap smear obtained.   Assessment:    Screening pap smear.   Plan:    Follow up in one year, or as indicated by Pap results.  Subjective:     MICHAIAH MAIDEN is a 40 y.o. woman who comes in today for a  pap smear only. Her most recent annual exam was on ***. Her most recent Pap smear was on *** and showed . Previous abnormal Pap smears: . Contraception:     Review of Systems    Objective:    LMP 02/06/2011 Pelvic Exam: . Pap smear obtained.   Assessment:    Screening pap smear.   Plan:    Follow up in  , or as indicated by Pap results.

## 2011-03-19 ENCOUNTER — Telehealth: Payer: Self-pay | Admitting: *Deleted

## 2011-03-19 NOTE — Telephone Encounter (Signed)
Patient lost her Medicaid temporarily and is out of meds.  She said it should be reinstated tomorrow and was asking about samples.  Told her as long as she can get her med tomorrow we could not give samples.  If she finds out it is going to be longer she will call back and let us know. Wendall Mola CMA

## 2011-03-19 NOTE — Telephone Encounter (Signed)
Pt left message stating that her "Medicaid was terminated."  She also requested "samples" of her medications.  RN returned call and left a message that the Center did not have her insurance as Medicaid but Medicare.  RN requested that pt call the Center to leave specific information about her insurance and her plan to have it reinstated.  Pt was informed that "samples" would be available if there is a lengthy delay in having her insurance reinstated.  Jennet Maduro, RN

## 2011-03-22 ENCOUNTER — Encounter: Payer: Self-pay | Admitting: *Deleted

## 2011-05-09 ENCOUNTER — Encounter: Payer: Medicare Other | Admitting: Advanced Practice Midwife

## 2011-05-23 ENCOUNTER — Other Ambulatory Visit: Payer: Self-pay | Admitting: *Deleted

## 2011-05-23 DIAGNOSIS — B20 Human immunodeficiency virus [HIV] disease: Secondary | ICD-10-CM

## 2011-05-23 MED ORDER — ATAZANAVIR SULFATE 300 MG PO CAPS: 300.0000 mg | ORAL_CAPSULE | Freq: Every day | ORAL | Status: DC

## 2011-05-23 MED ORDER — EMTRICITABINE-TENOFOVIR DF 200-300 MG PO TABS: 1.0000 | ORAL_TABLET | Freq: Every day | ORAL | Status: DC

## 2011-06-12 LAB — URINALYSIS, ROUTINE W REFLEX MICROSCOPIC
Bilirubin Urine: NEGATIVE
Ketones, ur: NEGATIVE
Nitrite: NEGATIVE
Protein, ur: NEGATIVE
Urobilinogen, UA: 1

## 2011-06-12 LAB — RAPID STREP SCREEN (MED CTR MEBANE ONLY): Streptococcus, Group A Screen (Direct): NEGATIVE

## 2011-06-22 ENCOUNTER — Ambulatory Visit (INDEPENDENT_AMBULATORY_CARE_PROVIDER_SITE_OTHER): Payer: Medicare Other | Admitting: Obstetrics & Gynecology

## 2011-06-22 ENCOUNTER — Other Ambulatory Visit (HOSPITAL_COMMUNITY)
Admission: RE | Admit: 2011-06-22 | Discharge: 2011-06-22 | Disposition: A | Discharge status: Home / Self Care | Payer: Medicare Other | Source: Ambulatory Visit | Attending: Obstetrics & Gynecology | Admitting: Obstetrics & Gynecology

## 2011-06-22 ENCOUNTER — Encounter: Payer: Self-pay | Admitting: Obstetrics & Gynecology

## 2011-06-22 DIAGNOSIS — R3915 Urgency of urination: Secondary | ICD-10-CM

## 2011-06-22 DIAGNOSIS — Z124 Encounter for screening for malignant neoplasm of cervix: Secondary | ICD-10-CM | POA: Insufficient documentation

## 2011-06-22 DIAGNOSIS — E669 Obesity, unspecified: Secondary | ICD-10-CM

## 2011-06-22 DIAGNOSIS — Z21 Asymptomatic human immunodeficiency virus [HIV] infection status: Secondary | ICD-10-CM

## 2011-06-22 DIAGNOSIS — Z0000 Encounter for general adult medical examination without abnormal findings: Secondary | ICD-10-CM

## 2011-06-22 DIAGNOSIS — B20 Human immunodeficiency virus [HIV] disease: Secondary | ICD-10-CM

## 2011-06-22 LAB — POCT URINALYSIS DIP (DEVICE)
Bilirubin Urine: NEGATIVE
Glucose, UA: NEGATIVE mg/dL
Ketones, ur: NEGATIVE mg/dL
Specific Gravity, Urine: 1.025 (ref 1.005–1.030)
Urobilinogen, UA: 0.2 mg/dL (ref 0.0–1.0)

## 2011-06-22 NOTE — Progress Notes (Signed)
  Subjective:    Patient ID: Danielle [name], female    DOB: 14-Nov-1970, 40 y.o.   MRN: [id number]  HPI  Danielle [name] is a HIV positive M AA G5P3 A2 who is sent here for a pap smear.  She thinks she already had one, but I cannot find it on EPIC.  Her biggest complaint for me today is that of polyuria and even nocturnal enuresis.  Review of Systems     Objective:   Physical Exam  Normal cervical exam, pap obtained Bimanual reveals a 14 week size uterus Breast exam normal       Assessment & Plan:  HIV -check pap smear Polyuria and enuresis-refer to urology after UC&S, UA, RBS Schedule mammogram

## 2011-06-22 NOTE — Progress Notes (Signed)
Pt. Reports still on period at present

## 2011-06-23 LAB — URINALYSIS
Bilirubin Urine: NEGATIVE
Glucose, UA: NEGATIVE mg/dL
Ketones, ur: NEGATIVE mg/dL
Leukocytes, UA: NEGATIVE
Protein, ur: NEGATIVE mg/dL

## 2011-07-02 ENCOUNTER — Other Ambulatory Visit: Payer: Self-pay | Admitting: *Deleted

## 2011-07-02 DIAGNOSIS — B20 Human immunodeficiency virus [HIV] disease: Secondary | ICD-10-CM

## 2011-07-02 MED ORDER — RITONAVIR 100 MG PO TABS: 100.0000 mg | ORAL_TABLET | Freq: Every day | ORAL | Status: DC

## 2011-07-12 ENCOUNTER — Ambulatory Visit (HOSPITAL_COMMUNITY)
Admission: RE | Admit: 2011-07-12 | Discharge: 2011-07-12 | Disposition: A | Discharge status: Home / Self Care | Payer: Medicare Other | Source: Ambulatory Visit | Attending: Obstetrics & Gynecology | Admitting: Obstetrics & Gynecology

## 2011-07-12 DIAGNOSIS — D241 Benign neoplasm of right breast: Secondary | ICD-10-CM

## 2011-07-12 DIAGNOSIS — Z1231 Encounter for screening mammogram for malignant neoplasm of breast: Secondary | ICD-10-CM | POA: Insufficient documentation

## 2011-07-12 DIAGNOSIS — Z0000 Encounter for general adult medical examination without abnormal findings: Secondary | ICD-10-CM

## 2011-07-12 HISTORY — DX: Benign neoplasm of right breast: D24.1

## 2011-07-13 ENCOUNTER — Other Ambulatory Visit: Payer: Self-pay | Admitting: Infectious Diseases

## 2011-07-13 ENCOUNTER — Other Ambulatory Visit (INDEPENDENT_AMBULATORY_CARE_PROVIDER_SITE_OTHER): Payer: Medicare Other

## 2011-07-13 DIAGNOSIS — Z79899 Other long term (current) drug therapy: Secondary | ICD-10-CM

## 2011-07-13 DIAGNOSIS — Z113 Encounter for screening for infections with a predominantly sexual mode of transmission: Secondary | ICD-10-CM

## 2011-07-13 DIAGNOSIS — B20 Human immunodeficiency virus [HIV] disease: Secondary | ICD-10-CM

## 2011-07-13 LAB — COMPREHENSIVE METABOLIC PANEL
Albumin: 4.4 g/dL (ref 3.5–5.2)
CO2: 23 mEq/L (ref 19–32)
Calcium: 9.3 mg/dL (ref 8.4–10.5)
Chloride: 107 mEq/L (ref 96–112)
Glucose, Bld: 94 mg/dL (ref 70–99)
Potassium: 4 mEq/L (ref 3.5–5.3)
Sodium: 141 mEq/L (ref 135–145)
Total Protein: 7.4 g/dL (ref 6.0–8.3)

## 2011-07-13 LAB — LIPID PANEL
Cholesterol: 195 mg/dL (ref 0–200)
Triglycerides: 142 mg/dL (ref <150)

## 2011-07-13 LAB — CBC
HCT: 31.2 % — ABNORMAL LOW (ref 36.0–46.0)
Hemoglobin: 9.2 g/dL — ABNORMAL LOW (ref 12.0–15.0)
MCH: 21.2 pg — ABNORMAL LOW (ref 26.0–34.0)
MCHC: 29.5 g/dL — ABNORMAL LOW (ref 30.0–36.0)
RBC: 4.34 MIL/uL (ref 3.87–5.11)

## 2011-07-13 LAB — T-HELPER CELL (CD4) - (RCID CLINIC ONLY): CD4 T Cell Abs: 710 uL (ref 400–2700)

## 2011-07-17 LAB — HIV-1 RNA QUANT-NO REFLEX-BLD

## 2011-07-18 ENCOUNTER — Other Ambulatory Visit: Payer: Self-pay | Admitting: Obstetrics & Gynecology

## 2011-07-18 DIAGNOSIS — R928 Other abnormal and inconclusive findings on diagnostic imaging of breast: Secondary | ICD-10-CM

## 2011-07-27 ENCOUNTER — Encounter: Payer: Self-pay | Admitting: Infectious Diseases

## 2011-07-27 ENCOUNTER — Ambulatory Visit (INDEPENDENT_AMBULATORY_CARE_PROVIDER_SITE_OTHER): Payer: Medicare Other | Admitting: Infectious Diseases

## 2011-07-27 VITALS — BP 121/78 | HR 96 | Temp 98.6°F | Ht 61.0 in | Wt 178.1 lb

## 2011-07-27 DIAGNOSIS — Z23 Encounter for immunization: Secondary | ICD-10-CM

## 2011-07-27 DIAGNOSIS — Z113 Encounter for screening for infections with a predominantly sexual mode of transmission: Secondary | ICD-10-CM

## 2011-07-27 DIAGNOSIS — R51 Headache: Secondary | ICD-10-CM

## 2011-07-27 DIAGNOSIS — B20 Human immunodeficiency virus [HIV] disease: Secondary | ICD-10-CM

## 2011-07-27 DIAGNOSIS — Z0000 Encounter for general adult medical examination without abnormal findings: Secondary | ICD-10-CM

## 2011-07-27 DIAGNOSIS — R3915 Urgency of urination: Secondary | ICD-10-CM

## 2011-07-27 DIAGNOSIS — Z79899 Other long term (current) drug therapy: Secondary | ICD-10-CM

## 2011-07-27 MED ORDER — BUTALBITAL-APAP-CAFFEINE 50-325-40 MG PO TABS: 1.0000 | ORAL_TABLET | Freq: Three times a day (TID) | ORAL | Status: AC | PRN

## 2011-07-27 NOTE — Progress Notes (Signed)
Addended by: Golden Circle A on: 07/27/2011 03:38 PM   Modules accepted: Orders

## 2011-07-27 NOTE — Assessment & Plan Note (Signed)
Not clear that this is migraine. Could be cluster? Will give her rx for fioricet.

## 2011-07-27 NOTE — Progress Notes (Signed)
Addended by: Mariea Clonts D on: 07/27/2011 03:43 PM   Modules accepted: Orders

## 2011-07-27 NOTE — Assessment & Plan Note (Signed)
Will recheck her UA today. If (-) will have her seen by Urology.

## 2011-07-27 NOTE — Progress Notes (Signed)
  Subjective:    Patient ID: Danielle [name], female    DOB: 04/17/1971, 40 y.o.   MRN: [id number]  HPI 40 yo F with HIV+ since Dx 1999. Has been living in [place] for last yr. Has been taking meds well. Has been feeling well. CD4 710 Vl <20? (07-13-11). Taking ATVr/TRV. Had NL PAP this year. Has been having migraine headache. Lasts for > 24 hours, lays down and then returns in the AM. No vision change, no change in sense of smell. Has been having urinary frequency- when she had GYN eval she was told "it's not cancer". No dysuria, no urinary cloudiness. Had normal UA 10-12, had UCx E coli (pan-sens). Has been wearing adult undergarments due to her issues, also had one episode of nocturia.  Has been having back pain, states she had bulging disc dx in [place].     Review of Systems     Objective:   Physical Exam  Constitutional: She appears well-developed and well-nourished.  Eyes: EOM are normal. Pupils are equal, round, and reactive to light.  Neck: Neck supple.  Cardiovascular: Normal rate, regular rhythm and normal heart sounds.   Pulmonary/Chest: Effort normal and breath sounds normal.  Abdominal: Soft. Bowel sounds are normal. She exhibits no distension.  Lymphadenopathy:    She has no cervical adenopathy.          Assessment & Plan:

## 2011-07-27 NOTE — Assessment & Plan Note (Signed)
She gets flu today. She is doing very well on her current ART. She is given condoms. Will see her back in 4-5 months with labs prior.

## 2011-07-28 LAB — URINALYSIS, ROUTINE W REFLEX MICROSCOPIC
Glucose, UA: NEGATIVE mg/dL
Hgb urine dipstick: NEGATIVE
Ketones, ur: NEGATIVE mg/dL
Protein, ur: NEGATIVE mg/dL
pH: 7.5 (ref 5.0–8.0)

## 2011-08-01 ENCOUNTER — Ambulatory Visit
Admission: RE | Admit: 2011-08-01 | Discharge: 2011-08-01 | Disposition: A | Discharge status: Home / Self Care | Payer: Medicare Other | Source: Ambulatory Visit | Attending: Obstetrics & Gynecology | Admitting: Obstetrics & Gynecology

## 2011-08-01 DIAGNOSIS — R928 Other abnormal and inconclusive findings on diagnostic imaging of breast: Secondary | ICD-10-CM

## 2011-12-12 ENCOUNTER — Other Ambulatory Visit: Payer: Self-pay | Admitting: Infectious Diseases

## 2011-12-12 DIAGNOSIS — B20 Human immunodeficiency virus [HIV] disease: Secondary | ICD-10-CM

## 2011-12-12 MED ORDER — ATAZANAVIR SULFATE 300 MG PO CAPS: 300.0000 mg | ORAL_CAPSULE | Freq: Every day | ORAL | Status: DC

## 2011-12-14 ENCOUNTER — Other Ambulatory Visit: Payer: Self-pay | Admitting: *Deleted

## 2011-12-14 DIAGNOSIS — Z113 Encounter for screening for infections with a predominantly sexual mode of transmission: Secondary | ICD-10-CM

## 2012-01-01 ENCOUNTER — Ambulatory Visit (INDEPENDENT_AMBULATORY_CARE_PROVIDER_SITE_OTHER): Payer: Medicare Other | Admitting: Internal Medicine

## 2012-01-01 ENCOUNTER — Encounter: Payer: Self-pay | Admitting: Internal Medicine

## 2012-01-01 VITALS — BP 116/70 | HR 102 | Temp 98.7°F | Ht 61.0 in | Wt 182.0 lb

## 2012-01-01 DIAGNOSIS — L259 Unspecified contact dermatitis, unspecified cause: Secondary | ICD-10-CM

## 2012-01-01 DIAGNOSIS — L309 Dermatitis, unspecified: Secondary | ICD-10-CM

## 2012-01-01 MED ORDER — HYDROCORTISONE 2.5 % EX OINT: TOPICAL_OINTMENT | Freq: Two times a day (BID) | CUTANEOUS | Status: DC

## 2012-01-01 NOTE — Assessment & Plan Note (Signed)
Discussed skin care, not to bathe frequently, not too hot of water, use detergent without dye.  Topical steroid for exacerbations.  Eucerin lotion.

## 2012-01-01 NOTE — Progress Notes (Signed)
  Subjective:    Patient ID: Danielle [name], female    DOB: 02/19/1971, 41 y.o.   MRN: [id number]  HPI Here to discuss issues with eczema.  Has been particularly itchy on back.  Has been using Dove soap.  Is unsure of what kind of detergent to use.  Otherwise taking ARV and has appt with Dr. Ninetta Lights in June.    Review of Systems  Skin: Positive for color change and rash. Negative for wound.       Objective:   Physical Exam  Skin:       Back dry, 2 patches of hyperpigmented areas          Assessment & Plan:

## 2012-01-28 ENCOUNTER — Other Ambulatory Visit: Payer: Medicare Other

## 2012-01-28 ENCOUNTER — Other Ambulatory Visit (HOSPITAL_COMMUNITY)
Admission: RE | Admit: 2012-01-28 | Discharge: 2012-01-28 | Disposition: A | Discharge status: Home / Self Care | Payer: Medicare Other | Source: Ambulatory Visit | Attending: Infectious Diseases | Admitting: Infectious Diseases

## 2012-01-28 DIAGNOSIS — Z113 Encounter for screening for infections with a predominantly sexual mode of transmission: Secondary | ICD-10-CM

## 2012-01-28 DIAGNOSIS — Z79899 Other long term (current) drug therapy: Secondary | ICD-10-CM | POA: Diagnosis not present

## 2012-01-28 DIAGNOSIS — B20 Human immunodeficiency virus [HIV] disease: Secondary | ICD-10-CM

## 2012-01-28 LAB — CBC
Hemoglobin: 8.1 g/dL — ABNORMAL LOW (ref 12.0–15.0)
MCHC: 28.2 g/dL — ABNORMAL LOW (ref 30.0–36.0)
RBC: 4.17 MIL/uL (ref 3.87–5.11)

## 2012-01-28 LAB — COMPLETE METABOLIC PANEL WITH GFR
ALT: 20 U/L (ref 0–35)
Albumin: 4.2 g/dL (ref 3.5–5.2)
Alkaline Phosphatase: 107 U/L (ref 39–117)
GFR, Est Non African American: >89 mL/min
Glucose, Bld: 89 mg/dL (ref 70–99)
Potassium: 3.7 mEq/L (ref 3.5–5.3)
Sodium: 141 mEq/L (ref 135–145)
Total Protein: 6.9 g/dL (ref 6.0–8.3)

## 2012-01-28 LAB — LIPID PANEL
LDL Cholesterol: 98 mg/dL (ref 0–99)
Total CHOL/HDL Ratio: 3.8 Ratio
VLDL: 24 mg/dL (ref 0–40)

## 2012-01-28 LAB — RPR

## 2012-01-29 LAB — HIV-1 RNA QUANT-NO REFLEX-BLD
HIV 1 RNA Quant: 21 copies/mL (ref <20)
HIV-1 RNA Quant, Log: 1.32 {Log} — ABNORMAL HIGH (ref <1.30)

## 2012-01-30 LAB — T-HELPER CELL (CD4) - (RCID CLINIC ONLY): CD4 T Cell Abs: 720 uL (ref 400–2700)

## 2012-02-18 ENCOUNTER — Encounter: Payer: Self-pay | Admitting: Infectious Diseases

## 2012-02-18 ENCOUNTER — Ambulatory Visit (INDEPENDENT_AMBULATORY_CARE_PROVIDER_SITE_OTHER): Payer: Medicare Other | Admitting: Infectious Diseases

## 2012-02-18 VITALS — BP 113/69 | HR 94 | Temp 99.0°F | Ht 61.0 in | Wt 189.0 lb

## 2012-02-18 DIAGNOSIS — L259 Unspecified contact dermatitis, unspecified cause: Secondary | ICD-10-CM

## 2012-02-18 DIAGNOSIS — B20 Human immunodeficiency virus [HIV] disease: Secondary | ICD-10-CM | POA: Diagnosis not present

## 2012-02-18 DIAGNOSIS — M545 Low back pain, unspecified: Secondary | ICD-10-CM | POA: Diagnosis not present

## 2012-02-18 DIAGNOSIS — L309 Dermatitis, unspecified: Secondary | ICD-10-CM

## 2012-02-18 DIAGNOSIS — R3915 Urgency of urination: Secondary | ICD-10-CM | POA: Diagnosis not present

## 2012-02-18 NOTE — Assessment & Plan Note (Signed)
Cont to use topical prn, same soaps, shampoos, eucerin.

## 2012-02-18 NOTE — Progress Notes (Signed)
  Subjective:    Patient ID: Danielle [name], female    DOB: 31-May-1971, 41 y.o.   MRN: [id number]  HPI 41 yo F with HIV+ since Dx 1999. Has been taking ATVr/TRV. Prev had difficulty with bulging disc, headaches, urinary frequency. UA (-) 11-12.  Prev had mammogram and breast u/s showing R fibroadenoma.  Was seen since last regualr visit for eczema. Given topical and had improvement, but still has some pruritis. No new soaps or shampoos. Using laundry detergent without dyes, dove soap. Also using eurcerin.  Cont to have back pain, attributes to change in weather. Hurts to point she has difficulty getting up to walk. Occas headaches. No f/c. Continues to have difficulty with polyuria, frequency. No dysuria or hematuria.  No probs with ART.  HIV 1 RNA Quant (copies/mL)  Date Value  01/28/2012 21   07/13/2011 Comment: HIV 1 RNA not detected.   01/30/2011 <20      CD4 T Cell Abs (cmm)  Date Value  01/28/2012 720   07/13/2011 710   01/30/2011 620        Review of Systems     Objective:   Physical Exam  Constitutional: She appears well-developed and well-nourished.  Eyes: EOM are normal. Pupils are equal, round, and reactive to light.  Neck: Neck supple.  Cardiovascular: Normal rate, regular rhythm and normal heart sounds.   Pulmonary/Chest: Effort normal and breath sounds normal.  Abdominal: Soft. Bowel sounds are normal. She exhibits no distension. There is no tenderness.  Lymphadenopathy:    She has no cervical adenopathy.  Skin:             Assessment & Plan:

## 2012-02-18 NOTE — Assessment & Plan Note (Signed)
Pt doing very well. Encouraged pt to cont the good work. Given condoms. vax up to date. rtc 6 months with labs prior.

## 2012-02-18 NOTE — Assessment & Plan Note (Signed)
Can make it to bathroom as long as she is close by and not obstructed. Does not have accidents when she is out of the of the house. Wears pads at night. Will send her to uro.

## 2012-02-18 NOTE — Assessment & Plan Note (Signed)
Cont prn otc.

## 2012-02-22 ENCOUNTER — Encounter: Payer: Self-pay | Admitting: *Deleted

## 2012-02-22 NOTE — Progress Notes (Unsigned)
Patient ID: MIGUEL MEDAL, female   DOB: October 22, 1970, 41 y.o.   MRN: [id number]  Pt has referral for Urologist. She has an appointment with [facility] [phone]) on April 04, 2012 @ 2pm with Dr. McDermitt. Tried to call patient but phone was disconnected. Sent letter to patient and [facility] is sending out a packet to patient to fill out. Tacey Heap RN

## 2012-04-04 DIAGNOSIS — N3944 Nocturnal enuresis: Secondary | ICD-10-CM | POA: Diagnosis not present

## 2012-04-04 DIAGNOSIS — R350 Frequency of micturition: Secondary | ICD-10-CM | POA: Diagnosis not present

## 2012-04-04 DIAGNOSIS — N3941 Urge incontinence: Secondary | ICD-10-CM | POA: Diagnosis not present

## 2012-05-09 DIAGNOSIS — R350 Frequency of micturition: Secondary | ICD-10-CM | POA: Diagnosis not present

## 2012-05-09 DIAGNOSIS — N3944 Nocturnal enuresis: Secondary | ICD-10-CM | POA: Diagnosis not present

## 2012-05-09 DIAGNOSIS — N3941 Urge incontinence: Secondary | ICD-10-CM | POA: Diagnosis not present

## 2012-05-19 DIAGNOSIS — N3944 Nocturnal enuresis: Secondary | ICD-10-CM | POA: Diagnosis not present

## 2012-05-19 DIAGNOSIS — N3941 Urge incontinence: Secondary | ICD-10-CM | POA: Diagnosis not present

## 2012-06-26 DIAGNOSIS — R351 Nocturia: Secondary | ICD-10-CM | POA: Diagnosis not present

## 2012-06-26 DIAGNOSIS — R350 Frequency of micturition: Secondary | ICD-10-CM | POA: Diagnosis not present

## 2012-07-21 ENCOUNTER — Other Ambulatory Visit: Payer: Self-pay | Admitting: Infectious Diseases

## 2012-07-31 ENCOUNTER — Emergency Department (HOSPITAL_COMMUNITY)
Admission: EM | Admit: 2012-07-31 | Discharge: 2012-08-01 | Discharge status: Against Medical Advice | Payer: Medicare Other | Attending: Emergency Medicine | Admitting: Emergency Medicine

## 2012-07-31 ENCOUNTER — Encounter (HOSPITAL_COMMUNITY): Payer: Self-pay | Admitting: *Deleted

## 2012-07-31 DIAGNOSIS — Z8659 Personal history of other mental and behavioral disorders: Secondary | ICD-10-CM | POA: Diagnosis not present

## 2012-07-31 DIAGNOSIS — F411 Generalized anxiety disorder: Secondary | ICD-10-CM | POA: Diagnosis not present

## 2012-07-31 DIAGNOSIS — Z21 Asymptomatic human immunodeficiency virus [HIV] infection status: Secondary | ICD-10-CM | POA: Diagnosis not present

## 2012-07-31 DIAGNOSIS — Z862 Personal history of diseases of the blood and blood-forming organs and certain disorders involving the immune mechanism: Secondary | ICD-10-CM | POA: Insufficient documentation

## 2012-07-31 DIAGNOSIS — Z8719 Personal history of other diseases of the digestive system: Secondary | ICD-10-CM | POA: Insufficient documentation

## 2012-07-31 DIAGNOSIS — G8929 Other chronic pain: Secondary | ICD-10-CM | POA: Diagnosis not present

## 2012-07-31 DIAGNOSIS — Z79899 Other long term (current) drug therapy: Secondary | ICD-10-CM | POA: Insufficient documentation

## 2012-07-31 DIAGNOSIS — R55 Syncope and collapse: Secondary | ICD-10-CM | POA: Diagnosis not present

## 2012-07-31 DIAGNOSIS — R0989 Other specified symptoms and signs involving the circulatory and respiratory systems: Secondary | ICD-10-CM | POA: Insufficient documentation

## 2012-07-31 DIAGNOSIS — R0609 Other forms of dyspnea: Secondary | ICD-10-CM | POA: Diagnosis not present

## 2012-07-31 DIAGNOSIS — J45909 Unspecified asthma, uncomplicated: Secondary | ICD-10-CM | POA: Insufficient documentation

## 2012-07-31 DIAGNOSIS — M549 Dorsalgia, unspecified: Secondary | ICD-10-CM | POA: Diagnosis not present

## 2012-07-31 NOTE — ED Notes (Signed)
Pt c/o pain in right hand and right foot probably from fall.  No obvious injury

## 2012-07-31 NOTE — ED Notes (Signed)
Patient states today she was having a lot of stress today due to the fact her daughter was missing for a while, patient states after daughter came home she started breathing really fast and passed out in yard, patient now with c/o right arm pain due to fall, no obvious deformity, pain mostly located in right hand

## 2012-08-01 ENCOUNTER — Other Ambulatory Visit: Payer: Self-pay

## 2012-08-01 NOTE — ED Notes (Signed)
Pt leaving AMA. 

## 2012-08-01 NOTE — ED Provider Notes (Signed)
History     CSN: [id number]  Arrival date & time 07/31/12  2147   First MD Initiated Contact with Patient 07/31/12 2332      Chief Complaint  Patient presents with  . Near Syncope  . Anxiety    (Consider location/radiation/quality/duration/timing/severity/associated sxs/prior treatment) HPI History provided by pt and her neighbor.  Patient was arguing with her daughter when she developed dyspnea.  Thought she might be having an asthma attack, stepped outside to get fresh air, asked her neighbor to get her albuterol inhaler and then passed out.  She does not recall feeling lightheaded or dizzy nor having CP or palpitations prior to passing out.  Her neighbor witnessed and says that she fell to ground and was unresponsive with her eyes closed, no seizure-like activity, for approx 30 minutes.  Patient says that she was alert during this time but unable to move or speak.  Per EMS report, when they arrived, she was laying in the middle of the street with several family members around her, but she stood up and walked to ambulance and was able to speak.  Pt developed mid-line chest pain en route to hospital but currently w/out CP and SOB.  Has h/o panic attack but has never had a syncopal episode.  No recent head trauma not illness including fever, cough, N/V/D or urinary sx.  No recent medication changes.  C/o pain in right wrist and ankle since fall.  No associated paresthesias.  Able to bear weight on RLE.    Past Medical History  Diagnosis Date  . HIV infection   . Anemia   . Asthma   . GERD (gastroesophageal reflux disease)   . Chronic back pain   . Depression     Past Surgical History  Procedure Date  . Hernia repair   . Other surgical history     biopsy axillary   . Cesarean section     twice    Family History  Problem Relation Age of Onset  . Hyperlipidemia Mother   . Diabetes Mother   . Cancer Father   . Diabetes Brother     History  Substance Use Topics  . Smoking  status: Never Smoker   . Smokeless tobacco: Never Used  . Alcohol Use: No    OB History    Grav Para Term Preterm Abortions TAB SAB Ect Mult Living   5 3 3  2  2   3       Review of Systems  All other systems reviewed and are negative.    Allergies  Review of patient's allergies indicates no known allergies.  Home Medications   Current Outpatient Rx  Name  Route  Sig  Dispense  Refill  . ALBUTEROL 90 MCG/ACT IN AERS   Inhalation   Inhale 1 puff into the lungs every 6 (six) hours as needed. For shortness of breath         . ATAZANAVIR SULFATE 300 MG PO CAPS   Oral   Take 1 capsule (300 mg total) by mouth daily.   30 capsule   5   . EMTRICITABINE-TENOFOVIR 200-300 MG PO TABS   Oral   Take 1 tablet by mouth daily.         Marland Kitchen RITONAVIR 100 MG PO CAPS   Oral   Take 100 mg by mouth 2 (two) times daily.           BP 116/69  Temp 98.7 F (37.1 C) (Oral)  Resp 20  SpO2 100%  LMP 07/30/2012  Physical Exam  Nursing note and vitals reviewed. Constitutional: She is oriented to person, place, and time. She appears well-developed and well-nourished. No distress.  HENT:  Head: Normocephalic and atraumatic.  Eyes:       Normal appearance  Neck: Normal range of motion.  Cardiovascular: Normal rate and regular rhythm.   Pulmonary/Chest: Effort normal and breath sounds normal. No respiratory distress.  Musculoskeletal: Normal range of motion.       Right wrist and ankle w/out deformity.  Edema at lateral malleolus.  Tenderness right distal radius and anatomical snuffbox.  Ankle non-tender.  Pain w/ minimal passive ROM of ankle.  Mild pain w/ ROM wrist and thumb.  NV RUE and RLE intact.   Neurological: She is alert and oriented to person, place, and time.  Skin: Skin is warm and dry. No rash noted.  Psychiatric: She has a normal mood and affect. Her behavior is normal.    ED Course  Procedures (including critical care time)  Date: 08/01/2012  Rate: 92  Rhythm:  normal sinus rhythm  QRS Axis: right  Intervals: normal  ST/T Wave abnormalities: nonspecific T wave changes  Conduction Disutrbances:none  Narrative Interpretation:   Old EKG Reviewed: none available   Labs Reviewed - No data to display No results found.   1. Syncope       MDM  41yo F presents w/ transient episode of ALOC (possibly syncope), likely secondary to panic attack and/or hyperventilation, as well as R wrist and ankle pain sustained in her fall.  EKG unremarkable.  Pt refused all labs and xrays because her children are at home.  Ortho tech provided her w/ ASO and thumb spica splint and pt referred to Hand d/t clinical concern for scaphoid fx.  She signed out AMA.  Risks of leaving, including death, discussed.  Advised her to f/u with her PCP asap and return if she develops CP, SOB, palpitations or syncope.         Otilio Miu, [place] 08/01/12 [phone]

## 2012-08-13 ENCOUNTER — Other Ambulatory Visit: Payer: Medicare Other

## 2012-08-22 ENCOUNTER — Other Ambulatory Visit: Payer: Self-pay | Admitting: Infectious Diseases

## 2012-08-25 ENCOUNTER — Other Ambulatory Visit: Payer: Self-pay | Admitting: *Deleted

## 2012-08-25 DIAGNOSIS — B20 Human immunodeficiency virus [HIV] disease: Secondary | ICD-10-CM

## 2012-08-25 MED ORDER — EMTRICITABINE-TENOFOVIR DF 200-300 MG PO TABS: 1.0000 | ORAL_TABLET | Freq: Every day | ORAL | Status: DC

## 2012-08-25 MED ORDER — ATAZANAVIR SULFATE 300 MG PO CAPS: 300.0000 mg | ORAL_CAPSULE | Freq: Every day | ORAL | Status: DC

## 2012-08-25 MED ORDER — RITONAVIR 100 MG PO CAPS: 100.0000 mg | ORAL_CAPSULE | Freq: Every day | ORAL | Status: DC

## 2012-08-27 ENCOUNTER — Encounter: Payer: Self-pay | Admitting: Infectious Diseases

## 2012-08-27 ENCOUNTER — Other Ambulatory Visit: Payer: Self-pay

## 2012-08-27 ENCOUNTER — Ambulatory Visit (INDEPENDENT_AMBULATORY_CARE_PROVIDER_SITE_OTHER): Payer: Medicare Other | Admitting: Infectious Diseases

## 2012-08-27 ENCOUNTER — Ambulatory Visit: Payer: Medicare Other | Admitting: Infectious Diseases

## 2012-08-27 ENCOUNTER — Ambulatory Visit
Admission: RE | Admit: 2012-08-27 | Discharge: 2012-08-27 | Disposition: A | Discharge status: Home / Self Care | Payer: Medicare Other | Source: Ambulatory Visit | Attending: Infectious Diseases | Admitting: Infectious Diseases

## 2012-08-27 ENCOUNTER — Ambulatory Visit (HOSPITAL_COMMUNITY)
Admission: RE | Admit: 2012-08-27 | Discharge: 2012-08-27 | Disposition: A | Discharge status: Home / Self Care | Payer: Medicare Other | Source: Ambulatory Visit | Attending: Infectious Diseases | Admitting: Infectious Diseases

## 2012-08-27 ENCOUNTER — Other Ambulatory Visit: Payer: Self-pay | Admitting: Infectious Diseases

## 2012-08-27 VITALS — BP 114/70 | HR 81 | Temp 98.6°F | Ht 61.0 in | Wt 173.5 lb

## 2012-08-27 DIAGNOSIS — Z79899 Other long term (current) drug therapy: Secondary | ICD-10-CM | POA: Insufficient documentation

## 2012-08-27 DIAGNOSIS — Z113 Encounter for screening for infections with a predominantly sexual mode of transmission: Secondary | ICD-10-CM | POA: Diagnosis not present

## 2012-08-27 DIAGNOSIS — R079 Chest pain, unspecified: Secondary | ICD-10-CM

## 2012-08-27 DIAGNOSIS — M25579 Pain in unspecified ankle and joints of unspecified foot: Secondary | ICD-10-CM | POA: Diagnosis not present

## 2012-08-27 DIAGNOSIS — M25539 Pain in unspecified wrist: Secondary | ICD-10-CM | POA: Diagnosis not present

## 2012-08-27 DIAGNOSIS — M79609 Pain in unspecified limb: Secondary | ICD-10-CM | POA: Diagnosis not present

## 2012-08-27 DIAGNOSIS — N921 Excessive and frequent menstruation with irregular cycle: Secondary | ICD-10-CM | POA: Diagnosis not present

## 2012-08-27 DIAGNOSIS — B20 Human immunodeficiency virus [HIV] disease: Secondary | ICD-10-CM | POA: Diagnosis not present

## 2012-08-27 DIAGNOSIS — N923 Ovulation bleeding: Secondary | ICD-10-CM

## 2012-08-27 DIAGNOSIS — M545 Low back pain, unspecified: Secondary | ICD-10-CM | POA: Diagnosis not present

## 2012-08-27 DIAGNOSIS — L309 Dermatitis, unspecified: Secondary | ICD-10-CM

## 2012-08-27 DIAGNOSIS — Z21 Asymptomatic human immunodeficiency virus [HIV] infection status: Secondary | ICD-10-CM | POA: Insufficient documentation

## 2012-08-27 DIAGNOSIS — J45909 Unspecified asthma, uncomplicated: Secondary | ICD-10-CM | POA: Insufficient documentation

## 2012-08-27 DIAGNOSIS — Z23 Encounter for immunization: Secondary | ICD-10-CM

## 2012-08-27 DIAGNOSIS — R55 Syncope and collapse: Secondary | ICD-10-CM | POA: Diagnosis not present

## 2012-08-27 DIAGNOSIS — L259 Unspecified contact dermatitis, unspecified cause: Secondary | ICD-10-CM | POA: Diagnosis not present

## 2012-08-27 LAB — COMPREHENSIVE METABOLIC PANEL
ALT: 14 U/L (ref 0–35)
AST: 19 U/L (ref 0–37)
Albumin: 4.1 g/dL (ref 3.5–5.2)
Alkaline Phosphatase: 120 U/L — ABNORMAL HIGH (ref 39–117)
BUN: 9 mg/dL (ref 6–23)
CO2: 27 mEq/L (ref 19–32)
Calcium: 9.6 mg/dL (ref 8.4–10.5)
Chloride: 104 mEq/L (ref 96–112)
Creat: 0.91 mg/dL (ref 0.50–1.10)
Glucose, Bld: 77 mg/dL (ref 70–99)
Potassium: 3.9 mEq/L (ref 3.5–5.3)
Sodium: 138 mEq/L (ref 135–145)
Total Bilirubin: 0.3 mg/dL (ref 0.3–1.2)
Total Protein: 7.4 g/dL (ref 6.0–8.3)

## 2012-08-27 LAB — CBC
Platelets: 386 10*3/uL (ref 150–400)
RDW: 18.2 % — ABNORMAL HIGH (ref 11.5–15.5)
WBC: 5.4 10*3/uL (ref 4.0–10.5)

## 2012-08-27 MED ORDER — RANITIDINE HCL 150 MG PO TABS: 150.0000 mg | ORAL_TABLET | Freq: Every day | ORAL | Status: DC

## 2012-08-27 NOTE — Progress Notes (Signed)
  Subjective:    Patient ID: Danielle [name], female    DOB: 01-27-1971, 41 y.o.   MRN: [id number]  HPI 41 yo F with hx of HIV+, maintained on ATVr/TRV. No HIV related visits since 2012.  Was in ED 10-01-11 after a fall, syncope related to asthma attack/panic attack. Also pain in her R leg and wrist (friend fell on her twice trying to help her get into ambulance). This was related to argument with her daughter. Has knot on R palm since, pain in her wrist. Was given splint and rec to use NSAID in ED. Has also developed R foot swelling. No radiographs done in ED.  Has been taking ART without difficulty. Missed last 4 days of ART since ran out of ATVr.  Had CP last PM for __ hours, Was initially improved with burping then returned. Still has now, decreased vs previous. Sharp. Unchanged with deep breathing or eating. No radiation. No nausea. No diaphoresis.  Also has been having episodes of back pain.     HIV 1 RNA Quant (copies/mL)  Date Value  01/28/2012 21   07/13/2011 Comment: HIV 1 RNA not detected.   01/30/2011 <20      CD4 T Cell Abs (cmm)  Date Value  01/28/2012 720   07/13/2011 710   01/30/2011 620       Review of Systems  Constitutional: Negative for fever, chills, appetite change and unexpected weight change.  Gastrointestinal: Negative for nausea and diarrhea.       Having some GI upset with dairy  Genitourinary: Positive for menstrual problem. Negative for difficulty urinating.  Musculoskeletal: Positive for back pain and arthralgias.       Objective:   Physical Exam  Constitutional: She appears well-developed and well-nourished.  HENT:  Mouth/Throat: No oropharyngeal exudate.  Eyes: EOM are normal. Pupils are equal, round, and reactive to light.  Neck: Neck supple.  Cardiovascular: Normal rate, regular rhythm and normal heart sounds.   Pulmonary/Chest: Effort normal and breath sounds normal.  Abdominal: Soft. Bowel sounds are normal. There is no tenderness.    Musculoskeletal:       Arms:      Feet:  Lymphadenopathy:    She has no cervical adenopathy.          Assessment & Plan:

## 2012-08-27 NOTE — Assessment & Plan Note (Addendum)
She is off meds for last 4 days. Her meds were refilled yesterday. She gets flu shot and condoms today. Her husbands status is inclear, i encouraged her to get him tested. Will see her back in 3-4 months with labs. Check her labs today.

## 2012-08-27 NOTE — Assessment & Plan Note (Signed)
Her ECG shows NSR with sinus arrythmia, will give her trial of H2 blocker, space 12 hours from her ART.

## 2012-08-27 NOTE — Assessment & Plan Note (Signed)
Will send her for plain films or her wrist as well as her ankle. Suspect ganglion cyst in her R hand.

## 2012-08-27 NOTE — Addendum Note (Signed)
Addended by: Mariea Clonts D on: 08/27/2012 12:16 PM   Modules accepted: Orders

## 2012-08-27 NOTE — Assessment & Plan Note (Signed)
Advised her to use OTC skin care creams. Will refill her previous rx if needed.

## 2012-08-27 NOTE — Assessment & Plan Note (Signed)
Having spotting between periods, needs pap as well. Will refer to GYN.

## 2012-08-27 NOTE — Assessment & Plan Note (Signed)
Trial of tylenol, NSAID with food. If she has worsening pain, radiation down her leg (R) will send her for MRI.

## 2012-08-28 LAB — T-HELPER CELL (CD4) - (RCID CLINIC ONLY): CD4 % Helper T Cell: 29 % — ABNORMAL LOW (ref 33–55)

## 2012-08-29 ENCOUNTER — Encounter: Payer: Self-pay | Admitting: *Deleted

## 2012-08-29 ENCOUNTER — Telehealth: Payer: Self-pay | Admitting: *Deleted

## 2012-08-29 NOTE — Telephone Encounter (Signed)
Patient called and notified of appointment made for her GYN referral. Appointment is 09/10/12 at 3:45 at [facility].  Information will be mailed to patient so that she can reschedule if she chooses.  Patient verbalized understanding. Andree Coss, RN

## 2012-09-01 NOTE — ED Provider Notes (Signed)
Medical screening examination/treatment/procedure(s) were performed by non-physician practitioner and as supervising physician I was immediately available for consultation/collaboration.  Rosanne Ashing, MD 09/01/12 2322

## 2012-09-18 ENCOUNTER — Encounter: Payer: Medicare Other | Admitting: Obstetrics & Gynecology

## 2012-10-06 ENCOUNTER — Other Ambulatory Visit: Payer: Medicare Other

## 2012-10-20 ENCOUNTER — Ambulatory Visit (INDEPENDENT_AMBULATORY_CARE_PROVIDER_SITE_OTHER): Payer: Medicare Other | Admitting: Infectious Diseases

## 2012-10-20 ENCOUNTER — Encounter: Payer: Self-pay | Admitting: Infectious Diseases

## 2012-10-20 VITALS — BP 123/71 | HR 91 | Temp 98.6°F | Ht 62.0 in | Wt 178.0 lb

## 2012-10-20 DIAGNOSIS — Z113 Encounter for screening for infections with a predominantly sexual mode of transmission: Secondary | ICD-10-CM | POA: Diagnosis not present

## 2012-10-20 DIAGNOSIS — B20 Human immunodeficiency virus [HIV] disease: Secondary | ICD-10-CM | POA: Diagnosis not present

## 2012-10-20 DIAGNOSIS — M25539 Pain in unspecified wrist: Secondary | ICD-10-CM | POA: Diagnosis not present

## 2012-10-20 DIAGNOSIS — M545 Low back pain, unspecified: Secondary | ICD-10-CM | POA: Diagnosis not present

## 2012-10-20 DIAGNOSIS — Z79899 Other long term (current) drug therapy: Secondary | ICD-10-CM | POA: Diagnosis not present

## 2012-10-20 LAB — COMPREHENSIVE METABOLIC PANEL
ALT: 12 U/L (ref 0–35)
AST: 17 U/L (ref 0–37)
CO2: 23 mEq/L (ref 19–32)
Creat: 0.93 mg/dL (ref 0.50–1.10)
Sodium: 138 mEq/L (ref 135–145)
Total Bilirubin: 1.5 mg/dL — ABNORMAL HIGH (ref 0.3–1.2)
Total Protein: 7.3 g/dL (ref 6.0–8.3)

## 2012-10-20 LAB — RPR

## 2012-10-20 LAB — CBC WITH DIFFERENTIAL/PLATELET
Basophils Relative: 0 % (ref 0–1)
HCT: 26.4 % — ABNORMAL LOW (ref 36.0–46.0)
Hemoglobin: 7.8 g/dL — ABNORMAL LOW (ref 12.0–15.0)
Lymphs Abs: 2.3 10*3/uL (ref 0.7–4.0)
MCH: 19 pg — ABNORMAL LOW (ref 26.0–34.0)
MCHC: 29.5 g/dL — ABNORMAL LOW (ref 30.0–36.0)
Monocytes Absolute: 0.3 10*3/uL (ref 0.1–1.0)
Monocytes Relative: 6 % (ref 3–12)
Neutro Abs: 2.8 10*3/uL (ref 1.7–7.7)

## 2012-10-20 LAB — LIPID PANEL
LDL Cholesterol: 117 mg/dL — ABNORMAL HIGH (ref 0–99)
Total CHOL/HDL Ratio: 4.3 Ratio
VLDL: 21 mg/dL (ref 0–40)

## 2012-10-20 MED ORDER — RITONAVIR 100 MG PO TABS: 100.0000 mg | ORAL_TABLET | Freq: Every day | ORAL | Status: DC

## 2012-10-20 MED ORDER — IBUPROFEN 800 MG PO TABS: 800.0000 mg | ORAL_TABLET | Freq: Three times a day (TID) | ORAL | Status: DC | PRN

## 2012-10-20 NOTE — Assessment & Plan Note (Addendum)
Will send her to lab. Take H2 in AM, ART in the evening. encouraged her to bring her husband in for testing. Will set her up for PAP clinic. rtc in 6 months.

## 2012-10-20 NOTE — Assessment & Plan Note (Signed)
Will have her eval by hand surgery.

## 2012-10-20 NOTE — Addendum Note (Signed)
Addended by: Mariea Clonts D on: 10/20/2012 11:04 AM   Modules accepted: Orders

## 2012-10-20 NOTE — Progress Notes (Signed)
  Subjective:    Patient ID: Danielle [name], female    DOB: [birth year], 42 y.o.   MRN: [id number]  HPI 42 yo F with hx of HIV+, maintained on ATVr/TRV. Has been taking ART "ok". Doesn't like taking RTV that she has to stay in fridge.  Denies missed meds. Wants to loose wt, does not like her "[place] belly"  HIV 1 RNA Quant (copies/mL)  Date Value  08/27/2012 <20   01/28/2012 21   07/13/2011 Comment: HIV 1 RNA not detected.      CD4 T Cell Abs (cmm)  Date Value  08/27/2012 780   01/28/2012 720   07/13/2011 710      Review of Systems  Constitutional: Negative for appetite change.  Gastrointestinal: Negative for diarrhea and constipation.  Genitourinary: Negative for difficulty urinating and menstrual problem.       Objective:   Physical Exam  Constitutional: She appears well-developed and well-nourished.  HENT:  Mouth/Throat: No oropharyngeal exudate.  Eyes: EOM are normal. Pupils are equal, round, and reactive to light.  Neck: Neck supple.  Cardiovascular: Normal rate, regular rhythm and normal heart sounds.   Pulmonary/Chest: Effort normal and breath sounds normal.  Abdominal: Soft. Bowel sounds are normal. There is no tenderness. There is no rebound.  Musculoskeletal: She exhibits no edema.  Lymphadenopathy:    She has no cervical adenopathy.          Assessment & Plan:

## 2012-10-20 NOTE — Assessment & Plan Note (Signed)
Refill her NSAID. Worse with weather change.

## 2012-10-21 ENCOUNTER — Telehealth: Payer: Self-pay | Admitting: *Deleted

## 2012-10-21 LAB — T-HELPER CELL (CD4) - (RCID CLINIC ONLY)
CD4 % Helper T Cell: 29 % — ABNORMAL LOW (ref 33–55)
CD4 T Cell Abs: 680 uL (ref 400–2700)

## 2012-10-21 LAB — HIV-1 RNA QUANT-NO REFLEX-BLD
HIV 1 RNA Quant: <20 copies/mL (ref <20)
HIV-1 RNA Quant, Log: <1.3 {Log} (ref <1.30)

## 2012-10-21 NOTE — Telephone Encounter (Signed)
Attempted to make referral to [facility] for evaluation of patient's wrist pain.  PCP incorrect on patient's [place] Access Medicaid card.  Patient called and notified that she will have to have her medicaid case worker change her PCP to [facility] for [place] to be able to make this referral.  Patient verbalized understanding.  Patient asked that I email her this information, but am unable to other than through MyChart.  Patient given new mychart access code so that she can set it up.   Andree Coss, RN

## 2012-11-03 ENCOUNTER — Telehealth: Payer: Self-pay | Admitting: *Deleted

## 2012-11-03 ENCOUNTER — Ambulatory Visit: Payer: Medicare Other

## 2012-11-03 NOTE — Progress Notes (Signed)
  Medical Nutrition Therapy:  Appt start time: 0900   End time: 1000.  ASSESSMENT:  Obesity/Weight Management.  Danielle [name] comes today for help with weight loss. Has unintentionally gained 70 lbs (UBW = 100 lbs) and believes it is d/t trying multiple anti-depressants.  Reports eating cornstarch straight from the box and labs show iron low, but possible rebounding? Is not supplementing iron at this time. States she does not know how much to take and when. Highly excessive CHO intake and meal skipping noted. No exercise noted d/t lumbar back pain.   MEDICATIONS:  Reviewed   DIETARY INTAKE:  Usual eating pattern includes 1 meal and 1-2 snacks per day.  24-hr recall:  B (AM): Fruity Pebbles or Cheerios w/ milk OR grits OR large apple Snk (AM): NONE  L (PM): NONE or apple, or bowl of cereal or drinks water/tea Snk (PM): NONE D (PM):  [place] steak, sweet peas, mashed sweet potatoes Snk ( PM): Unknown (ran out of time) Beverages: Water, homemade sweet tea, koolaid  Usual physical activity:  None d/t back pain  Estimated energy needs: 1100-1200 calories 135-150 g carbohydrates 65-75 g protein 30-35 g fat  Progress Towards Goal(s):  In progress.   Nutritional Diagnosis:  [place]-3.3 Overweight/obesity related to poor food choices and meal skipping as evidenced by patient reported food recall and BMI of 32.5 kg/m^2.    Intervention:  Nutrition education regarding better food choices, meal planning, and identifying high fat/high carb foods.  Handouts given during visit include:  Meal Planning Card  30g CHO Meal Plan  Low CHO Snack List  Monitoring/Evaluation:  Dietary intake, exercise, and body weight in 4-6 week(s).

## 2012-11-03 NOTE — Patient Instructions (Addendum)
Goals:  Eat 3 meals/day, Avoid meal skipping   Increase protein rich foods  Limit carbohydrate to 30 grams per meal and 15 grams per snack   Choose more whole grains, lean protein, low-fat dairy, and fruits/non-starchy vegetables (see back of yellow card).   Aim for >30 min of physical activity daily  Limit sugar-sweetened beverages and concentrated sweets

## 2012-11-03 NOTE — Telephone Encounter (Signed)
Patient advised per Dr. Ninetta Lights to start taking a multivitamin with iron. Danielle [name]

## 2012-11-05 ENCOUNTER — Other Ambulatory Visit: Payer: Self-pay | Admitting: *Deleted

## 2012-11-05 MED ORDER — ATAZANAVIR SULFATE 300 MG PO CAPS: 300.0000 mg | ORAL_CAPSULE | Freq: Every day | ORAL | Status: DC

## 2012-11-05 MED ORDER — EMTRICITABINE-TENOFOVIR DF 200-300 MG PO TABS: 1.0000 | ORAL_TABLET | Freq: Every day | ORAL | Status: DC

## 2012-11-13 DIAGNOSIS — N3941 Urge incontinence: Secondary | ICD-10-CM | POA: Diagnosis not present

## 2012-11-13 DIAGNOSIS — N3944 Nocturnal enuresis: Secondary | ICD-10-CM | POA: Diagnosis not present

## 2012-11-19 ENCOUNTER — Encounter: Payer: Self-pay | Admitting: Infectious Diseases

## 2013-02-13 ENCOUNTER — Emergency Department (INDEPENDENT_AMBULATORY_CARE_PROVIDER_SITE_OTHER)
Admission: EM | Admit: 2013-02-13 | Discharge: 2013-02-13 | Disposition: A | Discharge status: Home / Self Care | Payer: Medicare Other | Source: Home / Self Care

## 2013-02-13 ENCOUNTER — Encounter (HOSPITAL_COMMUNITY): Payer: Self-pay | Admitting: *Deleted

## 2013-02-13 DIAGNOSIS — R071 Chest pain on breathing: Secondary | ICD-10-CM

## 2013-02-13 DIAGNOSIS — K219 Gastro-esophageal reflux disease without esophagitis: Secondary | ICD-10-CM | POA: Diagnosis not present

## 2013-02-13 DIAGNOSIS — R0789 Other chest pain: Secondary | ICD-10-CM

## 2013-02-13 MED ORDER — SUCRALFATE 1 GM/10ML PO SUSP: 1.0000 g | Freq: Four times a day (QID) | ORAL | Status: DC

## 2013-02-13 NOTE — ED Provider Notes (Signed)
History     CSN: [id number]  Arrival date & time 02/13/13  1122   First MD Initiated Contact with Patient 02/13/13 1221      Chief Complaint  Patient presents with  . Chest Pain    (Consider location/radiation/quality/duration/timing/severity/associated sxs/prior treatment) HPI Comments: 42 year old female presents with a complaint of chest pain that was exacerbated last p.m. She is describing reflux symptoms with burning and sharp pain along the parasternal margins and retrosternal area. She has experienced this several months ago and was seen by her PCP who diagnosed GERD. She was given H2 blockers. She took them last night and they did not help her symptoms. Increased her symptoms and sitting upright significantly helped her symptoms. She denies nausea but feels a sensation of regurgitation into the throat. Denies shortness of breath, sweating or weakness. Denies history of cardiopulmonary disease or hypertension. She does have a history of HIV and is currently taking retrovirals.   Past Medical History  Diagnosis Date  . HIV infection   . Anemia   . Asthma   . GERD (gastroesophageal reflux disease)   . Chronic back pain   . Depression   . Bulging lumbar disc     Past Surgical History  Procedure Laterality Date  . Hernia repair    . Other surgical history      biopsy axillary   . Cesarean section      twice    Family History  Problem Relation Age of Onset  . Hyperlipidemia Mother   . Diabetes Mother   . Stroke Mother   . Cancer Father   . Diabetes Brother     History  Substance Use Topics  . Smoking status: Never Smoker   . Smokeless tobacco: Never Used  . Alcohol Use: No    OB History   Grav Para Term Preterm Abortions TAB SAB Ect Mult Living   5 3 3  2  2   3       Review of Systems  Constitutional: Positive for activity change. Negative for fever, diaphoresis and fatigue.  HENT: Negative.   Respiratory: Negative for cough, choking, shortness of  breath and wheezing.   Cardiovascular: Positive for chest pain. Negative for palpitations and leg swelling.  Gastrointestinal: Negative for nausea, vomiting, abdominal pain, diarrhea, constipation and blood in stool.  Genitourinary: Negative.   Musculoskeletal: Negative.   Skin: Negative.   Neurological: Negative.     Allergies  Review of patient's allergies indicates no known allergies.  Home Medications   Current Outpatient Rx  Name  Route  Sig  Dispense  Refill  . atazanavir (REYATAZ) 300 MG capsule   Oral   Take 1 capsule (300 mg total) by mouth daily with breakfast.   30 capsule   6   . emtricitabine-tenofovir (TRUVADA) 200-300 MG per tablet   Oral   Take 1 tablet by mouth daily.   30 tablet   6   . oxybutynin (DITROPAN-XL) 10 MG 24 hr tablet   Oral   Take 10 mg by mouth daily.          . ranitidine (ZANTAC) 150 MG tablet   Oral   Take 1 tablet (150 mg total) by mouth at bedtime. Take 12 hours apart from her reyetaz   30 tablet   3   . ritonavir (NORVIR) 100 MG TABS   Oral   Take 1 tablet (100 mg total) by mouth daily.   30 tablet   11   . albuterol (  PROVENTIL,VENTOLIN) 90 MCG/ACT inhaler   Inhalation   Inhale 1 puff into the lungs every 6 (six) hours as needed. For shortness of breath         . ibuprofen (ADVIL,MOTRIN) 800 MG tablet   Oral   Take 1 tablet (800 mg total) by mouth every 8 (eight) hours as needed for pain.   30 tablet   0   . sucralfate (CARAFATE) 1 GM/10ML suspension   Oral   Take 10 mLs (1 g total) by mouth 4 (four) times daily. 10 mL before meals and before bedtime   240 mL   0     BP 117/63  Pulse 75  Temp(Src) 98.5 F (36.9 C) (Oral)  Resp 21  SpO2 100%  LMP 02/02/2013  Physical Exam  Nursing note and vitals reviewed. Constitutional: She appears well-developed and well-nourished. No distress.  HENT:  Mouth/Throat: Oropharynx is clear and moist. No oropharyngeal exudate.  Eyes: Conjunctivae are normal.  Neck:  Normal range of motion. Neck supple.  Cardiovascular: Normal rate, regular rhythm, normal heart sounds and intact distal pulses.   No murmur heard. Pulmonary/Chest: Effort normal and breath sounds normal. No respiratory distress. She has no wheezes. She has no rales. She exhibits tenderness.   Reproducible chest wall tenderness across the upper anterior chest and parasternal areas. This produces withdrawal due to pain and states that his reproduces part of the pain for which she presents.  Musculoskeletal: She exhibits no edema.  Lymphadenopathy:    She has no cervical adenopathy.  Neurological: She is alert. She exhibits normal muscle tone.  Skin: Skin is warm. No rash noted. No erythema. No pallor.  Psychiatric: She has a normal mood and affect.    ED Course  Procedures (including critical care time)  Labs Reviewed - No data to display No results found.   1. GERD (gastroesophageal reflux disease)   2. Chest wall pain       MDM  Continue taking the Zantac twice a day. Am unable to add a proton pump inhibitor due to the interactions between her retroviral syndrome. One of the retroviral he is poorly absorbed decreasing efficacy and another one produces increased serum levels. Have given her some Carafate to take to assist with symptoms however she will have to follow up with her PCP. She probably need H. pylori as well as a referral to GI. If necessary infectious disease and Dr. pharmacy can tweak the dosages of her medications to account for changes due to the PPI. This can be coordinated with her PCP or ID. She also has chest wall pain and tenderness. Did not recommend NSAIDs at this time. Tylenol as needed for pain may also apply ice packs. Did not suspect cardiac or pulmonary etiology. EKG: Normal sinus rhythm without ischemic changes.  Hayden Rasmussen, NP 02/13/13 1318

## 2013-02-13 NOTE — ED Notes (Signed)
Pt reports she has had burning mid chest pain that is worse when she lies down, better while sitting since last night.  She saw her PCP for similar pain and was told it is reflux.  She denies SOB, nausea or diaphoresis

## 2013-02-13 NOTE — ED Provider Notes (Signed)
Medical screening examination/treatment/procedure(s) were performed by non-physician practitioner and as supervising physician I was immediately available for consultation/collaboration.  Raynald Blend, MD 02/13/13 1339

## 2013-02-23 ENCOUNTER — Ambulatory Visit: Payer: Medicare Other | Admitting: Infectious Diseases

## 2013-03-16 ENCOUNTER — Encounter: Payer: Self-pay | Admitting: Infectious Diseases

## 2013-03-16 ENCOUNTER — Ambulatory Visit (INDEPENDENT_AMBULATORY_CARE_PROVIDER_SITE_OTHER): Payer: Medicare Other | Admitting: Infectious Diseases

## 2013-03-16 ENCOUNTER — Telehealth: Payer: Self-pay | Admitting: *Deleted

## 2013-03-16 VITALS — BP 127/73 | HR 88 | Temp 98.5°F | Ht 61.0 in | Wt 176.0 lb

## 2013-03-16 DIAGNOSIS — N921 Excessive and frequent menstruation with irregular cycle: Secondary | ICD-10-CM

## 2013-03-16 DIAGNOSIS — K219 Gastro-esophageal reflux disease without esophagitis: Secondary | ICD-10-CM

## 2013-03-16 DIAGNOSIS — N923 Ovulation bleeding: Secondary | ICD-10-CM

## 2013-03-16 DIAGNOSIS — B20 Human immunodeficiency virus [HIV] disease: Secondary | ICD-10-CM

## 2013-03-16 MED ORDER — DOLUTEGRAVIR SODIUM 50 MG PO TABS: 50.0000 mg | ORAL_TABLET | Freq: Every day | ORAL | Status: DC

## 2013-03-16 MED ORDER — PANTOPRAZOLE SODIUM 40 MG PO TBEC: 40.0000 mg | DELAYED_RELEASE_TABLET | Freq: Every day | ORAL | Status: DC

## 2013-03-16 NOTE — Telephone Encounter (Signed)
RN reviewed pt record.  Current upper GI problem.  MD please advise about refilling ibuprofen.

## 2013-03-16 NOTE — Progress Notes (Signed)
  Subjective:    Patient ID: Danielle [name], female    DOB: 04/27/1971, 42 y.o.   MRN: [id number]  HPI 42 yo F with hx of HIV+ and GERD. Was seen in ED for GERD 02-13-13.  Was told she needed to get H pylori tested.  Had previous upper GI endo while living in [place] (prior to 2005). "I think I was bleeding or something".  Taking ATVr/TRV, carafate (ordered by ED but medicaid wouldn't pay for), zantac.   HIV 1 RNA Quant (copies/mL)  Date Value  10/20/2012 <20   08/27/2012 <20   01/28/2012 21      CD4 T Cell Abs (cmm)  Date Value  10/20/2012 680   08/27/2012 780   01/28/2012 720      Review of Systems  Constitutional: Negative for appetite change and unexpected weight change.  Respiratory: Negative for cough and shortness of breath.   Gastrointestinal: Positive for abdominal pain. Negative for nausea, diarrhea and constipation.  Genitourinary: Negative for difficulty urinating and menstrual problem.       Objective:   Physical Exam  Constitutional: She appears well-developed and well-nourished.  HENT:  Mouth/Throat: No oropharyngeal exudate.  Eyes: EOM are normal. Pupils are equal, round, and reactive to light.  Neck: Neck supple.  Cardiovascular: Normal rate, regular rhythm and normal heart sounds.   Pulmonary/Chest: Effort normal and breath sounds normal.  Abdominal: Soft. Bowel sounds are normal. There is no tenderness.  Lymphadenopathy:    She has no cervical adenopathy.          Assessment & Plan:

## 2013-03-16 NOTE — Assessment & Plan Note (Addendum)
Will change her ART to TRV/DTV to try and decrease her interactions with GERD rx (will not interact with PPI, H2). Given condoms. She needs to fix her [place] medicaid card to complete her eligibility. Will see her back in 3 months.

## 2013-03-16 NOTE — Assessment & Plan Note (Signed)
Will change her to PPI, off H2. Stop carafate, have her seen by GI. Will not send serologies for H pylori as they are not overly sensitive, and she may get endo and bx/better test.

## 2013-03-16 NOTE — Assessment & Plan Note (Signed)
Will schedule her for PAP.  

## 2013-03-23 ENCOUNTER — Telehealth: Payer: Self-pay | Admitting: *Deleted

## 2013-03-23 NOTE — Telephone Encounter (Signed)
Called patient to find out if she changed her Medicaid card from [place] to [facility] so that a referral can be made to GI. She was never seen at [facility] and when I called them they said they did not take Medicaid. Wendall Mola

## 2013-03-24 ENCOUNTER — Encounter: Payer: Self-pay | Admitting: Internal Medicine

## 2013-04-06 ENCOUNTER — Other Ambulatory Visit: Payer: Medicare Other

## 2013-04-07 ENCOUNTER — Ambulatory Visit: Payer: Medicare Other

## 2013-04-07 DIAGNOSIS — F4323 Adjustment disorder with mixed anxiety and depressed mood: Secondary | ICD-10-CM
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
## 2013-04-09 ENCOUNTER — Encounter: Payer: Self-pay | Admitting: Internal Medicine

## 2013-04-13 ENCOUNTER — Ambulatory Visit: Payer: Medicare Other

## 2013-04-14 ENCOUNTER — Encounter: Payer: Self-pay | Admitting: Internal Medicine

## 2013-04-14 ENCOUNTER — Ambulatory Visit (INDEPENDENT_AMBULATORY_CARE_PROVIDER_SITE_OTHER): Payer: Medicare Other | Admitting: Internal Medicine

## 2013-04-14 VITALS — BP 100/60 | HR 66 | Ht 61.0 in | Wt 176.2 lb

## 2013-04-14 DIAGNOSIS — R0789 Other chest pain: Secondary | ICD-10-CM | POA: Diagnosis not present

## 2013-04-14 DIAGNOSIS — K219 Gastro-esophageal reflux disease without esophagitis: Secondary | ICD-10-CM

## 2013-04-14 NOTE — Progress Notes (Signed)
Patient ID: Danielle [name], female   DOB: [birth year], 42 y.o.   MRN: [id number] HPI: Danielle [name] is a 42 yo female with PMH of HIV on antiretrovirals therapy, anemia, asthma, GERD, chronic back pain, anxiety he was seen in consultation at the request of Dr. Ninetta Lights to evaluate atypical chest pain. The patient reports a somewhat long-standing, though worse recently of substernal chest pain. She describes this as a sharp pain often after she has eaten. This pain can last 2-3 days. Zantac seems to help this pain. She was switched to pantoprazole 40 mg daily by Dr. Ninetta Lights. She feels that this works better but she is still continuing to have intermittent issues with chest pain. No dysphagia or odynophagia. Occasionally her chest pain is associated with dyspnea, but she denies diaphoresis. She denies an exertional component. She reports rare nausea but no vomiting. No early satiety. Her chest discomfort seems to happen most with lying down. She denies a change in bowel habit. No melena. No bright red blood per rectum. No diarrhea or constipation. No new medicines. No fevers or chills. No mouth ulcers.  Past Medical History  Diagnosis Date  . HIV infection   . Anemia   . Asthma   . GERD (gastroesophageal reflux disease)   . Chronic back pain   . Depression   . Bulging lumbar disc   . Anxiety   . Migraines   . Pneumonia     Past Surgical History  Procedure Laterality Date  . Hernia repair    . Other surgical history      biopsy axillary ,right arm  . Cesarean section      twice    Current Outpatient Prescriptions  Medication Sig Dispense Refill  . albuterol (PROVENTIL,VENTOLIN) 90 MCG/ACT inhaler Inhale 1 puff into the lungs every 6 (six) hours as needed. For shortness of breath      . dolutegravir (TIVICAY) 50 MG tablet Take 1 tablet (50 mg total) by mouth daily.  90 tablet  3  . emtricitabine-tenofovir (TRUVADA) 200-300 MG per tablet Take 1 tablet by mouth daily.      Marland Kitchen oxybutynin  (DITROPAN-XL) 10 MG 24 hr tablet Take 10 mg by mouth daily.       . pantoprazole (PROTONIX) 40 MG tablet Take 1 tablet (40 mg total) by mouth daily.  90 tablet  3   No current facility-administered medications for this visit.    No Known Allergies  Family History  Problem Relation Age of Onset  . Hyperlipidemia Mother   . Diabetes Mother   . Stroke Mother   . Diabetes Brother   . Colon cancer Father     History  Substance Use Topics  . Smoking status: Never Smoker   . Smokeless tobacco: Never Used  . Alcohol Use: No    ROS: As per history of present illness, otherwise negative  BP 100/60  Pulse 66  Ht 5\' 1"  (1.549 m)  Wt 176 lb 3.2 oz (79.924 kg)  BMI 33.31 kg/m2 Constitutional: Well-developed and well-nourished. No distress. HEENT: Normocephalic and atraumatic. Oropharynx is clear and moist. No oropharyngeal exudate. Conjunctivae are normal.  No scleral icterus. Neck: Neck supple. Trachea midline. Cardiovascular: Normal rate, regular rhythm and intact distal pulses.  Pulmonary/chest: Effort normal and breath sounds normal. No wheezing, rales or rhonchi. Abdominal: Soft, nontender, nondistended. Bowel sounds active throughout.  Extremities: no clubbing, cyanosis, or edema Lymphadenopathy: No cervical adenopathy noted. Neurological: Alert and oriented to person place and time. Skin: Skin is  warm and dry. No rashes noted. Psychiatric: Normal mood and affect. Behavior is normal.  RELEVANT LABS AND IMAGING: CBC    Component Value Date/Time   WBC 5.6 10/20/2012 1101   RBC 4.10 10/20/2012 1101   HGB 7.8* 10/20/2012 1101   HCT 26.4* 10/20/2012 1101   PLT 414* 10/20/2012 1101   MCV 64.4* 10/20/2012 1101   MCH 19.0* 10/20/2012 1101   MCHC 29.5* 10/20/2012 1101   RDW 18.1* 10/20/2012 1101   LYMPHSABS 2.3 10/20/2012 1101   MONOABS 0.3 10/20/2012 1101   EOSABS 0.1 10/20/2012 1101   BASOSABS 0.0 10/20/2012 1101    CMP     Component Value Date/Time   NA 138 10/20/2012 1101   K  3.6 10/20/2012 1101   CL 104 10/20/2012 1101   CO2 23 10/20/2012 1101   GLUCOSE 86 10/20/2012 1101   BUN 12 10/20/2012 1101   CREATININE 0.93 10/20/2012 1101   CREATININE 1.00 09/18/2010 2014   CALCIUM 9.2 10/20/2012 1101   PROT 7.3 10/20/2012 1101   ALBUMIN 4.3 10/20/2012 1101   AST 17 10/20/2012 1101   ALT 12 10/20/2012 1101   ALKPHOS 118* 10/20/2012 1101   BILITOT 1.5* 10/20/2012 1101    ASSESSMENT/PLAN: 42 yo female with PMH of HIV on antiretrovirals therapy, anemia, asthma, GERD, chronic back pain, anxiety he was seen in consultation at the request of Dr. Ninetta Lights to evaluate atypical chest pain  1.  Atypical chest pain -- her chest pain could be due to to uncontrolled GERD or even esophageal spasm. I have a low suspicion for mass lesion or infectious process in the esophagus due to to her lack of dysphagia and odynophagia. Also her CD4 count has been normal. She has not had complete response PPI therapy, and for this reason I have recommended upper endoscopy for direct visualization. We discussed the test today including the risks and benefits and she is agreeable to proceed. She has not been taking her pantoprazole before meals, and I recommended that she take it 30 minutes to one hour before her first meal today. She voices understanding. She can use Zantac for breakthrough symptoms, particularly at night. If upper endoscopy is unrevealing she may need manometry.  2.  HIV -- on antiretrovirals the with normal CD4 count and undetectable viral load at last check per Dr. Ninetta Lights

## 2013-04-14 NOTE — Patient Instructions (Addendum)
You have been scheduled for an endoscopy with propofol. Please follow written instructions given to you at your visit today. If you use inhalers (even only as needed), please bring them with you on the day of your procedure. Your physician has requested that you go to www.startemmi.com and enter the access code given to you at your visit today. This web site gives a general overview about your procedure. However, you should still follow specific instructions given to you by our office regarding your preparation for the procedure.   Continue taking Pantoprazole                                               We are excited to introduce MyChart, a new best-in-class service that provides you online access to important information in your electronic medical record. We want to make it easier for you to view your health information - all in one secure location - when and where you need it. We expect MyChart will enhance the quality of care and service we provide.  When you register for MyChart, you can:    View your test results.    Request appointments and receive appointment reminders via email.    Request medication renewals.    View your medical history, allergies, medications and immunizations.    Communicate with your physician's office through a password-protected site.    Conveniently print information such as your medication lists.  To find out if MyChart is right for you, please talk to a member of our clinical staff today. We will gladly answer your questions about this free health and wellness tool.  If you are age 40 or older and want a member of your family to have access to your record, you must provide written consent by completing a proxy form available at our office. Please speak to our clinical staff about guidelines regarding accounts for patients younger than age 12.  As you activate your MyChart account and need any technical assistance, please call the MyChart technical  support line at [phone]) or email your question to [email or web address]. If you email your question(s), please include your name, a return phone number and the best time to reach you.  If you have non-urgent health-related questions, you can send a message to our office through MyChart at [email or web address]. If you have a medical emergency, call 911.  Thank you for using MyChart as your new health and wellness resource!   MyChart licensed from [employer],  4540-9811. Patents Pending.

## 2013-04-28 ENCOUNTER — Ambulatory Visit: Payer: Medicare Other

## 2013-04-28 DIAGNOSIS — F411 Generalized anxiety disorder: Secondary | ICD-10-CM

## 2013-04-28 DIAGNOSIS — F330 Major depressive disorder, recurrent, mild: Secondary | ICD-10-CM

## 2013-04-28 NOTE — Progress Notes (Signed)
I met with Danielle [name] again today and she talked about her problems with parenting and how she and her husband are at odds with how to do it.  She said he has been on the road lately and that she is glad of it.  Also, she is looking forward to school starting back up so that she can have some time alone at her home.  We developed a treatment plan with 2 goals - to improve her mood and reduce her anxiety.  She was hyperverbal again today, with circumstantiality - giving minute and somewhat unnecessary details to her stories about her children and husband.  Plan to meet in 3 weeks.

## 2013-05-13 ENCOUNTER — Encounter: Payer: Self-pay | Admitting: Internal Medicine

## 2013-05-13 ENCOUNTER — Ambulatory Visit (AMBULATORY_SURGERY_CENTER): Payer: Medicare Other | Admitting: Internal Medicine

## 2013-05-13 VITALS — BP 109/72 | HR 67 | Temp 98.1°F | Resp 15 | Ht 61.0 in | Wt 176.0 lb

## 2013-05-13 DIAGNOSIS — K219 Gastro-esophageal reflux disease without esophagitis: Secondary | ICD-10-CM

## 2013-05-13 DIAGNOSIS — D126 Benign neoplasm of colon, unspecified: Secondary | ICD-10-CM

## 2013-05-13 DIAGNOSIS — R0789 Other chest pain: Secondary | ICD-10-CM | POA: Diagnosis not present

## 2013-05-13 DIAGNOSIS — F341 Dysthymic disorder: Secondary | ICD-10-CM | POA: Diagnosis not present

## 2013-05-13 MED ORDER — SODIUM CHLORIDE 0.9 % IV SOLN: 500.0000 mL | INTRAVENOUS | Status: DC

## 2013-05-13 NOTE — Progress Notes (Signed)
Report to pacu rn, vss, bbs=clear 

## 2013-05-13 NOTE — Patient Instructions (Addendum)
YOU HAD AN ENDOSCOPIC PROCEDURE TODAY AT THE [facility]: Refer to the procedure report that was given to you for any specific questions about what was found during the examination.  If the procedure report does not answer your questions, please call your gastroenterologist to clarify.  If you requested that your care partner not be given the details of your procedure findings, then the procedure report has been included in a sealed envelope for you to review at your convenience later.  YOU SHOULD EXPECT: Some feelings of bloating in the abdomen. Passage of more gas than usual.  Walking can help get rid of the air that was put into your GI tract during the procedure and reduce the bloating.  DIET: Your first meal following the procedure should be a light meal and then it is ok to progress to your normal diet.  A half-sandwich or bowl of soup is an example of a good first meal.  Heavy or fried foods are harder to digest and may make you feel nauseous or bloated.  Likewise meals heavy in dairy and vegetables can cause extra gas to form and this can also increase the bloating.  Drink plenty of fluids but you should avoid alcoholic beverages for 24 hours.  ACTIVITY: Your care partner should take you home directly after the procedure.  You should plan to take it easy, moving slowly for the rest of the day.  You can resume normal activity the day after the procedure however you should NOT DRIVE or use heavy machinery for 24 hours (because of the sedation medicines used during the test).    SYMPTOMS TO REPORT IMMEDIATELY: A gastroenterologist can be reached at any hour.  During normal business hours, 8:30 AM to 5:00 PM Monday through Friday, call [phone].  After hours and on weekends, please call the GI answering service at [phone] who will take a message and have the physician on call contact you.   Following lower endoscopy (colonoscopy or flexible sigmoidoscopy):  Excessive  amounts of blood in the stool  Significant tenderness or worsening of abdominal pains  Swelling of the abdomen that is new, acute  Fever of 100F or higher  Following upper endoscopy (EGD)  Vomiting of blood or coffee ground material  New chest pain or pain under the shoulder blades  Painful or persistently difficult swallowing  New shortness of breath  Fever of 100F or higher  Black, tarry-looking stools  FOLLOW UP: If any biopsies were taken you will be contacted by phone or by letter within the next 1-3 weeks.  Call your gastroenterologist if you have not heard about the biopsies in 3 weeks.  Our staff will call the home number listed on your records the next business day following your procedure to check on you and address any questions or concerns that you may have at that time regarding the information given to you following your procedure. This is a courtesy call and so if there is no answer at the home number and we have not heard from you through the emergency physician on call, we will assume that you have returned to your regular daily activities without incident.  SIGNATURES/CONFIDENTIALITY: You and/or your care partner have signed paperwork which will be entered into your electronic medical record.  These signatures attest to the fact that that the information above on your After Visit Summary has been reviewed and is understood.  Full responsibility of the confidentiality of this discharge information lies with you  and/or your care-partner.  Await pathology, Continue your current medications Dr. Lauro Franklin office will call you to set up an esophageal manometry test- they will call with the date, time, and directions

## 2013-05-13 NOTE — Op Note (Signed)
[facility] [address] [place], [address]   ENDOSCOPY PROCEDURE REPORT  PATIENT: Danielle, [name]  MR#: [id number] BIRTHDATE: Jan 28, 1971 , 42  yrs. old GENDER: Female ENDOSCOPIST: Beverley Fiedler, MD REFERRED BY:  Johny Sax, M.D. PROCEDURE DATE:  05/13/2013 PROCEDURE:  EGD w/ biopsy ASA CLASS:     Class III INDICATIONS:  Atypical chest pain.   history of GERD. MEDICATIONS: MAC sedation, administered by CRNA and Propofol (Diprivan) 180 mg IV TOPICAL ANESTHETIC: Cetacaine Spray  DESCRIPTION OF PROCEDURE: After the risks benefits and alternatives of the procedure were thoroughly explained, informed consent was obtained.  The LB [id number] endoscope was introduced through the mouth and advanced to the second portion of the duodenum. Without limitations.  The instrument was slowly withdrawn as the mucosa was fully examined.  ESOPHAGUS: There was pooling of secretions in the middle and distal esophagus on initial inspection, raising the question dysmotility. This fluid was aspirated easily through the scope and the mucosa of the esophagus appeared normal.  Multiple biopsies were taken in the distal and mid esophagus to rule out eosinophilic esophagitis.  STOMACH: The mucosa of the entire stomach appeared normal.  DUODENUM: The duodenal mucosa showed no abnormalities in the bulb and second portion of the duodenum.  Retroflexed views revealed no abnormalities.     The scope was then withdrawn from the patient and the procedure completed.  COMPLICATIONS: There were no complications.  ENDOSCOPIC IMPRESSION: 1.   Fluid in the distal esophagus, query dysmotility.  The mucosa of the esophagus appeared normal; multiple biopsies were taken in the distal and mid esophagus to rule out eosinophilic esophagitis 2.   The mucosa of the stomach appeared normal 3.   The duodenal mucosa showed no abnormalities in the bulb and second portion of the  duodenum  RECOMMENDATIONS: 1.  Await pathology results 2.  Continue current medications 3.  My office will arrange for you to have an esophageal manometry test performed.  This is an test of you muscle function of your esophagus and may help explain your atypical chest pain  eSigned:  Beverley Fiedler, MD 05/13/2013 10:38 AM CC:The Patient and Johny Sax, MD

## 2013-05-13 NOTE — Progress Notes (Addendum)
1055- pt sat HOB up, c/o chest pain, rating as a "9"  She states it is the same discomfort that she has had.  Sipping water  1105- pt states chest discomfort is a "7."  No different than pain she came in with .  Dr. Terri Piedra is aware and pt ok for discharge.  Pt states "I'm ready to go home."

## 2013-05-13 NOTE — Progress Notes (Signed)
Called to room to assist during endoscopic procedure.  Patient ID and intended procedure confirmed with present staff. Received instructions for my participation in the procedure from the performing physician.  

## 2013-05-14 ENCOUNTER — Telehealth: Payer: Self-pay | Admitting: *Deleted

## 2013-05-14 NOTE — Telephone Encounter (Signed)
No answer, message left for the patient. 

## 2013-05-18 ENCOUNTER — Telehealth: Payer: Self-pay | Admitting: *Deleted

## 2013-05-18 NOTE — Telephone Encounter (Signed)
Pt's HIV medications were changed by Dr. Ninetta Lights, 03/16/13, due to pt GI symptoms and medication interaction with PPI and H2 blockers.  Pt referred to Dr. Rhea Belton.  Dr. Rhea Belton examined the pt. 04/14/13 for GI symptoms.  Ranitidine refill requested by [facility] today was under Dr. Moshe Cipro name with different directions from Dr. Lauro Franklin note of 04/14/13.  Notified Dr. Lauro Franklin office.  Dr Lauro Franklin office to consult with Dr. Ninetta Lights about medication management.

## 2013-05-18 NOTE — Telephone Encounter (Signed)
Denise with Dr Moshe Cipro ofc states she received a refill on Zantac for pt and some of pt's meds interact with her HIV meds. After we researched the pt's chart together, Angelique Blonder would like to know if Dr Rhea Belton will call Dr Ninetta Lights and discuss pt's meds and H2 Blockers. Med in question was Raytaz? That was contraindicated with Zantac; pt is no longer on the med. Thanks.

## 2013-05-18 NOTE — Telephone Encounter (Signed)
Spoke to Dr. Ninetta Lights by phone pantoprazole 40 mg daily in the morning and famotidine or ranitidine per box instructions in the evening or bedtime as needed is okay with her current HIV medication

## 2013-05-19 ENCOUNTER — Telehealth: Payer: Self-pay | Admitting: Internal Medicine

## 2013-05-19 ENCOUNTER — Telehealth: Payer: Self-pay | Admitting: *Deleted

## 2013-05-19 ENCOUNTER — Ambulatory Visit: Payer: Medicare Other

## 2013-05-19 ENCOUNTER — Encounter: Payer: Self-pay | Admitting: Internal Medicine

## 2013-05-19 ENCOUNTER — Emergency Department (HOSPITAL_COMMUNITY)
Admission: EM | Admit: 2013-05-19 | Discharge: 2013-05-19 | Disposition: A | Discharge status: Home / Self Care | Payer: Medicare Other | Attending: Emergency Medicine | Admitting: Emergency Medicine

## 2013-05-19 ENCOUNTER — Emergency Department (HOSPITAL_COMMUNITY): Payer: Medicare Other

## 2013-05-19 ENCOUNTER — Encounter (HOSPITAL_COMMUNITY): Payer: Self-pay

## 2013-05-19 DIAGNOSIS — Z91040 Latex allergy status: Secondary | ICD-10-CM | POA: Insufficient documentation

## 2013-05-19 DIAGNOSIS — Z8739 Personal history of other diseases of the musculoskeletal system and connective tissue: Secondary | ICD-10-CM | POA: Diagnosis not present

## 2013-05-19 DIAGNOSIS — Z8679 Personal history of other diseases of the circulatory system: Secondary | ICD-10-CM | POA: Insufficient documentation

## 2013-05-19 DIAGNOSIS — Z21 Asymptomatic human immunodeficiency virus [HIV] infection status: Secondary | ICD-10-CM | POA: Insufficient documentation

## 2013-05-19 DIAGNOSIS — J45901 Unspecified asthma with (acute) exacerbation: Secondary | ICD-10-CM | POA: Insufficient documentation

## 2013-05-19 DIAGNOSIS — Z79899 Other long term (current) drug therapy: Secondary | ICD-10-CM | POA: Diagnosis not present

## 2013-05-19 DIAGNOSIS — R0789 Other chest pain: Secondary | ICD-10-CM | POA: Diagnosis not present

## 2013-05-19 DIAGNOSIS — Z862 Personal history of diseases of the blood and blood-forming organs and certain disorders involving the immune mechanism: Secondary | ICD-10-CM | POA: Diagnosis not present

## 2013-05-19 DIAGNOSIS — G8929 Other chronic pain: Secondary | ICD-10-CM | POA: Diagnosis not present

## 2013-05-19 DIAGNOSIS — Z8659 Personal history of other mental and behavioral disorders: Secondary | ICD-10-CM | POA: Insufficient documentation

## 2013-05-19 DIAGNOSIS — R05 Cough: Secondary | ICD-10-CM | POA: Diagnosis not present

## 2013-05-19 DIAGNOSIS — K219 Gastro-esophageal reflux disease without esophagitis: Secondary | ICD-10-CM | POA: Insufficient documentation

## 2013-05-19 DIAGNOSIS — R079 Chest pain, unspecified: Secondary | ICD-10-CM | POA: Diagnosis not present

## 2013-05-19 DIAGNOSIS — Z8701 Personal history of pneumonia (recurrent): Secondary | ICD-10-CM | POA: Insufficient documentation

## 2013-05-19 DIAGNOSIS — F4323 Adjustment disorder with mixed anxiety and depressed mood: Secondary | ICD-10-CM

## 2013-05-19 LAB — CBC
HCT: 29.5 % — ABNORMAL LOW (ref 36.0–46.0)
Hemoglobin: 9.1 g/dL — ABNORMAL LOW (ref 12.0–15.0)
MCH: 22.2 pg — ABNORMAL LOW (ref 26.0–34.0)
MCHC: 30.8 g/dL (ref 30.0–36.0)
MCV: 72.1 fL — ABNORMAL LOW (ref 78.0–100.0)

## 2013-05-19 LAB — BASIC METABOLIC PANEL
BUN: 12 mg/dL (ref 6–23)
Calcium: 9.7 mg/dL (ref 8.4–10.5)
Creatinine, Ser: 1.16 mg/dL — ABNORMAL HIGH (ref 0.50–1.10)
GFR calc non Af Amer: 58 mL/min — ABNORMAL LOW (ref >90)
Glucose, Bld: 83 mg/dL (ref 70–99)

## 2013-05-19 MED ORDER — PANTOPRAZOLE SODIUM 40 MG PO TBEC
40.0000 mg | DELAYED_RELEASE_TABLET | Freq: Once | ORAL | Status: DC
  Filled 2013-05-19: qty 1

## 2013-05-19 MED ORDER — GI COCKTAIL ~~LOC~~
30.0000 mL | Freq: Once | ORAL | Status: AC
  Administered 2013-05-19: 30 mL via ORAL
  Filled 2013-05-19: qty 30

## 2013-05-19 NOTE — ED Notes (Signed)
EDP at bedside  

## 2013-05-19 NOTE — ED Notes (Signed)
PT comfortable with d/c and f/u instructions. No prescriptions. 

## 2013-05-19 NOTE — Telephone Encounter (Signed)
Informed Danielle [name] that Dr Rhea Belton has not reviewed her path, but there is no evidence of infection or malignancy. I will call if Dr Rhea Belton has anything to add and we will be mailing her a path letter. Danielle [name] stated understanding.

## 2013-05-19 NOTE — ED Notes (Signed)
Pt presents to ED with non radiating, intermittent mid-sternum chest pain starting this am, SOB, lightheaded, and feeling dizzy. Pt states the pain started this am after having a "disagreement" with her spouse, pt took a protonix with no relief, pt states her symptoms continue to get worse with no relief. Pt reports having a endoscopy last week

## 2013-05-19 NOTE — Telephone Encounter (Signed)
Patient called stating that she took the "chest pain" medicine prescribed by Dr. Ninetta Lights, protonix and did not have any relief so she took another one. She states that since this her chest is hurting worse and her upper back. She had an appointment with Franne Forts, counselor today and said she was not feeling any anxiety. Advised patient to go to the ED and if she has worsening chest pain she should either have someone drive her or call [id number] for an ambulance. She will take a list of her current medications. Wendall Mola CMA

## 2013-05-19 NOTE — ED Provider Notes (Signed)
.   Date: 05/19/2013 17:34  Rate: 98  Rhythm: normal sinus rhythm  QRS Axis: normal  Intervals: normal  ST/T Wave abnormalities: normal  Conduction Disutrbances: none  Narrative Interpretation: unremarkable; no ischemic changes, diffuse ST flattening      Layla Maw Roisin Mones, DO 05/19/13 1958

## 2013-05-19 NOTE — Progress Notes (Signed)
Danielle [name] was hyper-verbal again today, talking excessively about her family situation.  She is frustrated with her husband because he injured his wrist and is out on sick leave for 2 weeks as a result.  She talked about several disagreements they have had recently regarding parenting.  He is upset that her 40 year old daughter (from a previous marriage) is talking with a 32 year old at the community college, but Danielle [name] feels like this is not a problem.  They also bicker over the other 2 children - ages 35 and 55.  She spent the whole session going into detail about various problems she is having with him (her husband) and it was difficult to stop her long enough to provide any insights or suggestions.  Plan to meet in 5 weeks due to scheduling.

## 2013-05-19 NOTE — Telephone Encounter (Signed)
lmom for pt to call back

## 2013-05-19 NOTE — ED Provider Notes (Signed)
TIME SEEN: 6:38 PM  CHIEF COMPLAINT: Chest pain  HPI: Patient is a 42 year old female with a history of HIV on antiretrovirals with no prior history of opportunistic infection, asthma and esophageal dysmotility who presents the emergency department with several days of sharp chest pain without radiation in the center of her chest. Patient denies any aggravating or alleviating factors. She describes as a moderate pain. She also feels she has been wheezing intermittently. She has had very mild shortness of breath. No nausea, vomiting or diaphoresis. No prior history of cardiac disease. No prior PE or DVT. No history of prolonged immobilization, hospitalization, exogenous hormone use or tobacco use, recent fracture, surgery, trauma. Patient is unsure what her CD4 count or viral load are but states that they are "good". Patient reports that she has had chest pain all day today that was worse after having a fight with her husband and has now improved.  ROS: See HPI Constitutional: no fever  Eyes: no drainage  ENT: no runny nose   Cardiovascular:   chest pain  Resp: SOB  GI: no vomiting GU: no dysuria Integumentary: no rash  Allergy: no hives  Musculoskeletal: no leg swelling  Neurological: no slurred speech ROS otherwise negative  PAST MEDICAL HISTORY/PAST SURGICAL HISTORY:  Past Medical History  Diagnosis Date  . HIV infection   . Anemia   . Asthma   . GERD (gastroesophageal reflux disease)   . Chronic back pain   . Depression   . Bulging lumbar disc   . Anxiety   . Migraines   . Pneumonia     MEDICATIONS:  Prior to Admission medications   Medication Sig Start Date End Date Taking? Authorizing Provider  albuterol (PROVENTIL,VENTOLIN) 90 MCG/ACT inhaler Inhale 2 puffs into the lungs daily as needed for wheezing or shortness of breath.    Yes Historical Provider, MD  dolutegravir (TIVICAY) 50 MG tablet Take 50 mg by mouth at bedtime.   Yes Historical Provider, MD   emtricitabine-tenofovir (TRUVADA) 200-300 MG per tablet Take 1 tablet by mouth at bedtime.    Yes Historical Provider, MD  ibuprofen (ADVIL,MOTRIN) 200 MG tablet Take 400 mg by mouth daily as needed for pain.   Yes Historical Provider, MD  oxybutynin (DITROPAN-XL) 10 MG 24 hr tablet Take 10 mg by mouth daily.  08/08/12  Yes Historical Provider, MD  pantoprazole (PROTONIX) 40 MG tablet Take 1 tablet (40 mg total) by mouth daily. 03/16/13  Yes Ginnie Smart, MD    ALLERGIES:  Allergies  Allergen Reactions  . Latex Rash    SOCIAL HISTORY:  History  Substance Use Topics  . Smoking status: Never Smoker   . Smokeless tobacco: Never Used  . Alcohol Use: No    FAMILY HISTORY: Family History  Problem Relation Age of Onset  . Hyperlipidemia Mother   . Diabetes Mother   . Stroke Mother   . Diabetes Brother   . Colon cancer Father   . Esophageal cancer Neg Hx   . Rectal cancer Neg Hx   . Stomach cancer Neg Hx     EXAM: BP 116/76  Pulse 100  Temp(Src) 99.1 F (37.3 C) (Oral)  Resp 16  Ht 5\' 1"  (1.549 m)  Wt 142 lb (64.411 kg)  BMI 26.84 kg/m2  SpO2 98%  LMP 05/09/2013 CONSTITUTIONAL: Alert and oriented and responds appropriately to questions. Well-appearing; well-nourished HEAD: Normocephalic EYES: Conjunctivae clear, PERRL ENT: normal nose; no rhinorrhea; moist mucous membranes; pharynx without lesions noted NECK: Supple, no  meningismus, no LAD  CARD: RRR; S1 and S2 appreciated; no murmurs, no clicks, no rubs, no gallops;  Patient is very tender to palpation over her central chest wall RESP: Normal chest excursion without splinting or tachypnea; breath sounds clear and equal bilaterally; no wheezes, no rhonchi, no rales,  ABD/GI: Normal bowel sounds; non-distended; soft, non-tender, no rebound, no guarding BACK:  The back appears normal and is non-tender to palpation, there is no CVA tenderness EXT: Normal ROM in all joints; non-tender to palpation; no edema; normal  capillary refill; no cyanosis    SKIN: Normal color for age and race; warm NEURO: Moves all extremities equally PSYCH: The patient's mood and manner are appropriate. Grooming and personal hygiene are appropriate.  MEDICAL DECISION MAKING: Patient with atypical chest pain with no risk factors for ACS or pulmonary embolus. She is hemodynamically stable. We'll obtain cardiac labs, chest x-ray. We'll give GI cocktail and proton X. given patient does have a history of esophageal dysmotility seen on endoscopy last week. Given patient's atypical chest pain with low risk factor, anticipate discharge home with outpatient followup if workup is unremarkable.  ED PROGRESS: Cardiac labs unremarkable. Given patient has minimal risk factors for ACS and no risk factors for pulmonary embolus and has a very atypical story for chest pain, I do not feel she needs any other workup at this time. Given return precautions, PCP followup. Patient verbalizes understanding is comfortable with this plan.    Layla Maw Chavy Avera, DO 05/19/13 1956

## 2013-05-19 NOTE — Telephone Encounter (Signed)
Thanks, will forward to [facility], Dr Moshe Cipro nurse.

## 2013-05-20 NOTE — Telephone Encounter (Signed)
Called pt.  Reviewed instructions for taking Protonix and ranitidine.  Pt verbalized back the appropriate instructions.

## 2013-05-21 ENCOUNTER — Telehealth: Payer: Self-pay | Admitting: *Deleted

## 2013-05-21 NOTE — Telephone Encounter (Signed)
Informed pt that Dr Rhea Belton requested she have an Esophageal Manometry exam and we have her scheduled for 06/01/13 8am. I am mailing her instructions and she may call for questions or to reschedule the appt. Pt stated understanding.

## 2013-05-25 ENCOUNTER — Ambulatory Visit (INDEPENDENT_AMBULATORY_CARE_PROVIDER_SITE_OTHER): Payer: Medicare Other | Admitting: *Deleted

## 2013-05-25 ENCOUNTER — Other Ambulatory Visit (HOSPITAL_COMMUNITY)
Admission: RE | Admit: 2013-05-25 | Discharge: 2013-05-25 | Disposition: A | Discharge status: Home / Self Care | Payer: Medicare Other | Source: Ambulatory Visit | Attending: Infectious Diseases | Admitting: Infectious Diseases

## 2013-05-25 ENCOUNTER — Other Ambulatory Visit: Payer: Medicare Other

## 2013-05-25 DIAGNOSIS — Z124 Encounter for screening for malignant neoplasm of cervix: Secondary | ICD-10-CM

## 2013-05-25 DIAGNOSIS — Z1231 Encounter for screening mammogram for malignant neoplasm of breast: Secondary | ICD-10-CM

## 2013-05-25 DIAGNOSIS — Z113 Encounter for screening for infections with a predominantly sexual mode of transmission: Secondary | ICD-10-CM

## 2013-05-25 DIAGNOSIS — B20 Human immunodeficiency virus [HIV] disease: Secondary | ICD-10-CM | POA: Diagnosis not present

## 2013-05-25 LAB — COMPLETE METABOLIC PANEL WITH GFR
ALT: 20 U/L (ref 0–35)
Albumin: 4.1 g/dL (ref 3.5–5.2)
BUN: 13 mg/dL (ref 6–23)
CO2: 26 mEq/L (ref 19–32)
Calcium: 9.4 mg/dL (ref 8.4–10.5)
Chloride: 108 mEq/L (ref 96–112)
Creat: 1.26 mg/dL — ABNORMAL HIGH (ref 0.50–1.10)
GFR, Est African American: 62 mL/min
Potassium: 3.9 mEq/L (ref 3.5–5.3)

## 2013-05-25 LAB — CBC
HCT: 28.2 % — ABNORMAL LOW (ref 36.0–46.0)
MCV: 70.3 fL — ABNORMAL LOW (ref 78.0–100.0)
Platelets: 282 10*3/uL (ref 150–400)
RBC: 4.01 MIL/uL (ref 3.87–5.11)
WBC: 5.1 10*3/uL (ref 4.0–10.5)

## 2013-05-25 NOTE — Progress Notes (Signed)
  Subjective:     Danielle [name] is a 42 y.o. woman who comes in today for a  pap smear only. Previous abnormal Pap smears: no. Contraception: condoms    Objective:    LMP 05/09/2013 Pelvic Exam:  Pap smear obtained.   Assessment:    Screening pap smear.   Plan:    Follow up in one year, or as indicated by Pap results.  Pt given educational materials re: HIV and women, self-esteem, nutrition and diet management, PAP smears and partner safety. Pt given condoms.

## 2013-05-25 NOTE — Patient Instructions (Addendum)
  Your results will be ready in about a week.  You may look them up on MyChart.  Thank you for coming to the Center for your care.  Sahib Pella, RN 

## 2013-05-26 ENCOUNTER — Telehealth: Payer: Self-pay | Admitting: Licensed Clinical Social Worker

## 2013-05-26 LAB — T-HELPER CELL (CD4) - (RCID CLINIC ONLY): CD4 T Cell Abs: 690 /uL (ref 400–2700)

## 2013-05-26 NOTE — Telephone Encounter (Signed)
Patient had a mammogram 3 years ago and was supposed to follow up with a bilateral  diagnostic mammogram but never did. Breast imaging would like those orders updated so the patient can have it done, she had a rt breast mass. Breast imaging faxed records from the last screening and would like Dr. Ninetta Lights to put the orders in.

## 2013-05-27 ENCOUNTER — Encounter: Payer: Self-pay | Admitting: *Deleted

## 2013-05-27 ENCOUNTER — Telehealth: Payer: Self-pay | Admitting: Internal Medicine

## 2013-05-27 ENCOUNTER — Other Ambulatory Visit: Payer: Self-pay | Admitting: Infectious Diseases

## 2013-05-27 DIAGNOSIS — D241 Benign neoplasm of right breast: Secondary | ICD-10-CM

## 2013-05-27 NOTE — Telephone Encounter (Signed)
R/S EM for June 15, 2013 at 09:30am. Pt mailed new instructions and stated understanding.

## 2013-05-28 ENCOUNTER — Telehealth: Payer: Self-pay | Admitting: *Deleted

## 2013-05-28 NOTE — Telephone Encounter (Signed)
Pt strained with bowel movement this AM.  Found small amount of blood on tissue.  RN previously had observed external hemorrhoid during pap smear earlier this week.  RN advised pt to try sitting in warm tub of water each evening and trying OTC hemorrhoid ointment per the package instructions.  Pt verbalized understanding.

## 2013-06-03 ENCOUNTER — Other Ambulatory Visit: Payer: Self-pay | Admitting: Infectious Diseases

## 2013-06-03 DIAGNOSIS — A5901 Trichomonal vulvovaginitis: Secondary | ICD-10-CM

## 2013-06-03 MED ORDER — METRONIDAZOLE 500 MG PO TABS: 2000.0000 mg | ORAL_TABLET | Freq: Once | ORAL | Status: DC

## 2013-06-08 ENCOUNTER — Other Ambulatory Visit: Payer: Medicare Other

## 2013-06-09 ENCOUNTER — Telehealth: Payer: Self-pay | Admitting: *Deleted

## 2013-06-09 NOTE — Telephone Encounter (Signed)
Pt called to let us know that she cannot use [facility] for PCP care.  She has this practice listed on her new [place] access medicaid card - she had contacted them a few months ago to see if she could have them be her PCP on her card.  When she called today, she was told that [facility] is not accepting new medicaid/medicare patients at this time.  She is trying to get a referral to a hand surgeon for evaluation.   RN suggested that she contact the [facility] for [facility] and [employer] [phone]) and ask if they will accept her as a patient, change her card again to reflect this, ask if they would make a referral to the [facility].  Pt in agreement.  She will contact us if they are unable to accept her at this time. Andree Coss, RN

## 2013-06-14 ENCOUNTER — Other Ambulatory Visit: Payer: Self-pay | Admitting: Infectious Diseases

## 2013-06-14 DIAGNOSIS — B20 Human immunodeficiency virus [HIV] disease: Secondary | ICD-10-CM

## 2013-06-15 ENCOUNTER — Ambulatory Visit (HOSPITAL_COMMUNITY)
Admission: RE | Admit: 2013-06-15 | Discharge: 2013-06-15 | Disposition: A | Discharge status: Home / Self Care | Payer: Medicare Other | Source: Ambulatory Visit | Attending: Internal Medicine | Admitting: Internal Medicine

## 2013-06-22 ENCOUNTER — Encounter: Payer: Self-pay | Admitting: Infectious Diseases

## 2013-06-22 ENCOUNTER — Ambulatory Visit (INDEPENDENT_AMBULATORY_CARE_PROVIDER_SITE_OTHER): Payer: Medicare Other | Admitting: Infectious Diseases

## 2013-06-22 VITALS — BP 106/67 | HR 82 | Temp 99.1°F | Ht 61.0 in | Wt 173.0 lb

## 2013-06-22 DIAGNOSIS — B20 Human immunodeficiency virus [HIV] disease: Secondary | ICD-10-CM

## 2013-06-22 DIAGNOSIS — Z113 Encounter for screening for infections with a predominantly sexual mode of transmission: Secondary | ICD-10-CM | POA: Diagnosis not present

## 2013-06-22 DIAGNOSIS — Z23 Encounter for immunization: Secondary | ICD-10-CM

## 2013-06-22 DIAGNOSIS — Z79899 Other long term (current) drug therapy: Secondary | ICD-10-CM

## 2013-06-22 DIAGNOSIS — M545 Low back pain, unspecified: Secondary | ICD-10-CM | POA: Diagnosis not present

## 2013-06-22 NOTE — Assessment & Plan Note (Signed)
She is doing well. Given condoms, flu shot. Taking meds well. See her back in 6 months.

## 2013-06-22 NOTE — Assessment & Plan Note (Signed)
Offered to have her seen by neurosurgery. She is not interested.

## 2013-06-22 NOTE — Progress Notes (Signed)
  Subjective:    Patient ID: Danielle [name], female    DOB: [birth year], 43 y.o.   MRN: [id number]  HPI 42 yo F with hx of HIV+ and GERD. Taking ATVr/TRV. CR has increased slightly.  No problems with meds. Gets flu shot today. Had PAP 05-2013, Trich+.  Hx of back pain- MRI 2009- Minimal disc bulge at L4-5 and L5, S1.   HIV 1 RNA Quant (copies/mL)  Date Value  05/25/2013 <20   10/20/2012 <20   08/27/2012 <20      CD4 T Cell Abs (/uL)  Date Value  05/25/2013 690   10/20/2012 680   08/27/2012 780     Review of Systems  Constitutional: Negative for appetite change and unexpected weight change.  Gastrointestinal: Negative for diarrhea and constipation.  Genitourinary: Negative for difficulty urinating and menstrual problem.   Has back pain. Also has noted numbness, tingling in her L foot. No pain relief for her back from oxycodone, hydrocodone, neurontin.     Objective:   Physical Exam  Constitutional: She appears well-developed and well-nourished.  HENT:  Mouth/Throat: No oropharyngeal exudate.  Eyes: EOM are normal. Pupils are equal, round, and reactive to light.  Neck: Neck supple.  Cardiovascular: Normal rate, regular rhythm and normal heart sounds.   Pulmonary/Chest: Effort normal and breath sounds normal.  Abdominal: Soft. Bowel sounds are normal. There is no tenderness. There is no rebound.  Lymphadenopathy:    She has no cervical adenopathy.           Assessment & Plan:

## 2013-06-23 ENCOUNTER — Ambulatory Visit: Payer: Medicare Other

## 2013-06-23 DIAGNOSIS — F4320 Adjustment disorder, unspecified: Secondary | ICD-10-CM | POA: Diagnosis not present

## 2013-06-23 NOTE — Progress Notes (Signed)
Danielle [name] was very talkative again today.  She said [place] go.  She said she is tired of living here and went into a long discussion about how her neighbors park their car in front of their driveway.  She also continues to be frustrated with her husband, who is now at home all the time after injuring his wrist on the job.  He recently had surgery and may have to have more done, she said.  Plan to meet in 4 weeks.

## 2013-06-29 ENCOUNTER — Encounter (HOSPITAL_COMMUNITY)
Admission: RE | Disposition: A | Discharge status: Home / Self Care | Payer: Self-pay | Source: Ambulatory Visit | Attending: Internal Medicine

## 2013-06-29 ENCOUNTER — Ambulatory Visit (HOSPITAL_COMMUNITY)
Admission: RE | Admit: 2013-06-29 | Discharge: 2013-06-29 | Disposition: A | Discharge status: Home / Self Care | Payer: Medicare Other | Source: Ambulatory Visit | Attending: Internal Medicine | Admitting: Internal Medicine

## 2013-06-29 DIAGNOSIS — K219 Gastro-esophageal reflux disease without esophagitis: Secondary | ICD-10-CM | POA: Insufficient documentation

## 2013-06-29 DIAGNOSIS — R0789 Other chest pain: Secondary | ICD-10-CM | POA: Diagnosis not present

## 2013-06-29 DIAGNOSIS — K224 Dyskinesia of esophagus: Secondary | ICD-10-CM | POA: Insufficient documentation

## 2013-06-29 HISTORY — PX: ESOPHAGEAL MANOMETRY: SHX5429

## 2013-06-29 SURGERY — MANOMETRY, ESOPHAGUS
Anesthesia: Topical

## 2013-06-29 MED ORDER — LIDOCAINE VISCOUS 2 % MT SOLN
OROMUCOSAL | Status: AC
  Filled 2013-06-29: qty 15

## 2013-06-29 SURGICAL SUPPLY — 4 items
DRAPE UTILITY 15X26 W/TAPE STR (DRAPE) ×2 IMPLANT
GLOVE BIOGEL PI IND STRL 8 (GLOVE) ×1 IMPLANT
GLOVE BIOGEL PI INDICATOR 8 (GLOVE) ×1
GOWN PREVENTION PLUS LG XLONG (DISPOSABLE) ×2 IMPLANT

## 2013-06-30 ENCOUNTER — Encounter (HOSPITAL_COMMUNITY): Payer: Self-pay | Admitting: Internal Medicine

## 2013-07-03 ENCOUNTER — Telehealth: Payer: Self-pay

## 2013-07-03 NOTE — Telephone Encounter (Signed)
Patient calling regarding migraine headache for 3 days. Requesting medication. Pt has a primary care physician and advised to call his office for assistance.  Laurell Josephs, RN

## 2013-07-04 DIAGNOSIS — M549 Dorsalgia, unspecified: Secondary | ICD-10-CM | POA: Diagnosis not present

## 2013-07-04 DIAGNOSIS — Z79899 Other long term (current) drug therapy: Secondary | ICD-10-CM | POA: Diagnosis not present

## 2013-07-04 DIAGNOSIS — Z5181 Encounter for therapeutic drug level monitoring: Secondary | ICD-10-CM | POA: Diagnosis not present

## 2013-07-04 DIAGNOSIS — IMO0002 Reserved for concepts with insufficient information to code with codable children: Secondary | ICD-10-CM | POA: Diagnosis not present

## 2013-07-06 ENCOUNTER — Ambulatory Visit: Payer: Medicare Other | Attending: Internal Medicine | Admitting: Internal Medicine

## 2013-07-06 ENCOUNTER — Encounter: Payer: Self-pay | Admitting: Internal Medicine

## 2013-07-06 VITALS — BP 116/73 | HR 77 | Temp 99.0°F | Resp 16 | Ht 61.61 in | Wt 176.0 lb

## 2013-07-06 DIAGNOSIS — F329 Major depressive disorder, single episode, unspecified: Secondary | ICD-10-CM | POA: Diagnosis not present

## 2013-07-06 DIAGNOSIS — L723 Sebaceous cyst: Secondary | ICD-10-CM | POA: Insufficient documentation

## 2013-07-06 DIAGNOSIS — F411 Generalized anxiety disorder: Secondary | ICD-10-CM

## 2013-07-06 DIAGNOSIS — IMO0002 Reserved for concepts with insufficient information to code with codable children: Secondary | ICD-10-CM

## 2013-07-06 DIAGNOSIS — J45909 Unspecified asthma, uncomplicated: Secondary | ICD-10-CM

## 2013-07-06 DIAGNOSIS — B20 Human immunodeficiency virus [HIV] disease: Secondary | ICD-10-CM

## 2013-07-06 DIAGNOSIS — M67441 Ganglion, right hand: Secondary | ICD-10-CM | POA: Insufficient documentation

## 2013-07-06 DIAGNOSIS — M6740 Ganglion, unspecified site: Secondary | ICD-10-CM

## 2013-07-06 NOTE — Progress Notes (Signed)
Patient ID: Danielle [name], female   DOB: November 06, 1970, 42 y.o.   MRN: [id number]  CC: "knot on right palm"  HPI: 42 year old female with PMHx of HIV on HAART and asthma who presented to the clinic for evaluation on "knot on right palm" for past few days. She reports some pain occasionally, no open wounds, no drainage. She still has good strength in this hand. No insect bites or trauma to the hand.  Allergies  Allergen Reactions  . Latex Rash   Past Medical History  Diagnosis Date  . HIV infection   . Anemia   . Asthma   . GERD (gastroesophageal reflux disease)   . Chronic back pain   . Depression   . Bulging lumbar disc   . Anxiety   . Migraines   . Pneumonia    Current Outpatient Prescriptions on File Prior to Visit  Medication Sig Dispense Refill  . albuterol (PROVENTIL,VENTOLIN) 90 MCG/ACT inhaler Inhale 2 puffs into the lungs daily as needed for wheezing or shortness of breath.       . dolutegravir (TIVICAY) 50 MG tablet Take 50 mg by mouth at bedtime.      Marland Kitchen oxybutynin (DITROPAN-XL) 10 MG 24 hr tablet Take 10 mg by mouth daily.       . pantoprazole (PROTONIX) 40 MG tablet Take 1 tablet (40 mg total) by mouth daily.  90 tablet  3  . ranitidine (ZANTAC) 150 MG tablet       . TRUVADA 200-300 MG per tablet TAKE ONE TABLET BY MOUTH ONE TIME DAILY  30 tablet  5  . ibuprofen (ADVIL,MOTRIN) 200 MG tablet Take 400 mg by mouth daily as needed for pain.       No current facility-administered medications on file prior to visit.   Family History  Problem Relation Age of Onset  . Hyperlipidemia Mother   . Diabetes Mother   . Stroke Mother   . Diabetes Brother   . Colon cancer Father   . Esophageal cancer Neg Hx   . Rectal cancer Neg Hx   . Stomach cancer Neg Hx    History   Social History  . Marital Status: Married    Spouse Name: N/A    Number of Children: 3  . Years of Education: N/A   Occupational History  . House wife    Social History Main Topics  . Smoking  status: Never Smoker   . Smokeless tobacco: Never Used  . Alcohol Use: No  . Drug Use: No  . Sexual Activity: Yes    Partners: Male    Birth Control/ Protection: Surgical     Comment: pt. given condoms   Other Topics Concern  . Not on file   Social History Narrative  . No narrative on file    Review of Systems  Constitutional: Negative for fever, chills, diaphoresis, activity change, appetite change and fatigue.  HENT: Negative for ear pain, nosebleeds, congestion, facial swelling, rhinorrhea, neck pain, neck stiffness and ear discharge.   Eyes: Negative for pain, discharge, redness, itching and visual disturbance.  Respiratory: Negative for cough, choking, chest tightness, shortness of breath, wheezing and stridor.   Cardiovascular: Negative for chest pain, palpitations and leg swelling.  Gastrointestinal: Negative for abdominal distention.  Genitourinary: Negative for dysuria, urgency, frequency, hematuria, flank pain, decreased urine volume, difficulty urinating and dyspareunia.  Musculoskeletal: Negative for back pain, joint swelling, arthralgias and gait problem.  Neurological: Negative for dizziness, tremors, seizures, syncope, facial asymmetry,  speech difficulty, weakness, light-headedness, numbness and headaches.  Hematological: Negative for adenopathy. Does not bruise/bleed easily.  Psychiatric/Behavioral: Negative for hallucinations, behavioral problems, confusion, dysphoric mood, decreased concentration and agitation.    Objective:   Filed Vitals:   07/06/13 1029  BP: 116/73  Pulse: 77  Temp: 99 F (37.2 C)  Resp: 16    Physical Exam  Constitutional: Appears well-developed and well-nourished. No distress.  HENT: Normocephalic. External right and left ear normal. Oropharynx is clear and moist.  Eyes: Conjunctivae and EOM are normal. PERRLA, no scleral icterus.  Neck: Normal ROM. Neck supple. No JVD. No tracheal deviation. No thyromegaly.  CVS: RRR, S1/S2 +, no  murmurs, no gallops, no carotid bruit.  Pulmonary: Effort and breath sounds normal, no stridor, rhonchi, wheezes, rales.  Abdominal: Soft. BS +,  no distension, tenderness, rebound or guarding.  Musculoskeletal: Normal range of motion. No edema and no tenderness. small cyst on palmar aspect of right hand Lymphadenopathy: No lymphadenopathy noted, cervical, inguinal. Neuro: Alert. Normal reflexes, muscle tone coordination. No cranial nerve deficit. Skin: Skin is warm and dry. No rash noted. Not diaphoretic. No erythema. No pallor.  Psychiatric: Normal mood and affect. Behavior, judgment, thought content normal.   Lab Results  Component Value Date   WBC 5.1 05/25/2013   HGB 8.7* 05/25/2013   HCT 28.2* 05/25/2013   MCV 70.3* 05/25/2013   PLT 282 05/25/2013   Lab Results  Component Value Date   CREATININE 1.26* 05/25/2013   BUN 13 05/25/2013   NA 139 05/25/2013   K 3.9 05/25/2013   CL 108 05/25/2013   CO2 26 05/25/2013    No results found for this basename: HGBA1C   Lipid Panel     Component Value Date/Time   CHOL 180 10/20/2012 1101   TRIG 103 10/20/2012 1101   HDL 42 10/20/2012 1101   CHOLHDL 4.3 10/20/2012 1101   VLDL 21 10/20/2012 1101   LDLCALC 117* 10/20/2012 1101       Assessment and plan:   Patient Active Problem List   Diagnosis Date Noted  . Cyst in hand 07/06/2013    Priority: High - referral to hand surgery provided  . GERD (gastroesophageal reflux disease) 03/16/2013    Priority: Medium - continue protonix  . DEPRESSION 02/10/2009    Priority: Medium - stable, not on edications  . HIV DISEASE 03/24/2007    Priority: Medium - on HAART

## 2013-07-06 NOTE — Progress Notes (Signed)
Pt is here to est care C/o a knot on right palm/hand onset June  Alert w/no signs of acute distress.

## 2013-07-09 ENCOUNTER — Telehealth: Payer: Self-pay | Admitting: *Deleted

## 2013-07-09 NOTE — Telephone Encounter (Signed)
Informed pt she does have some motility problems on the EM and Dr Rhea Belton would like to discuss her options. Pt stated understanding and will come 07/29/13.

## 2013-07-13 DIAGNOSIS — E7800 Pure hypercholesterolemia, unspecified: Secondary | ICD-10-CM | POA: Diagnosis not present

## 2013-07-13 DIAGNOSIS — R51 Headache: Secondary | ICD-10-CM | POA: Diagnosis not present

## 2013-07-13 DIAGNOSIS — M549 Dorsalgia, unspecified: Secondary | ICD-10-CM | POA: Diagnosis not present

## 2013-07-13 DIAGNOSIS — G8929 Other chronic pain: Secondary | ICD-10-CM | POA: Diagnosis not present

## 2013-07-13 DIAGNOSIS — IMO0002 Reserved for concepts with insufficient information to code with codable children: Secondary | ICD-10-CM | POA: Diagnosis not present

## 2013-07-27 ENCOUNTER — Encounter: Payer: Self-pay | Admitting: Internal Medicine

## 2013-07-29 ENCOUNTER — Encounter: Payer: Self-pay | Admitting: Internal Medicine

## 2013-07-29 ENCOUNTER — Ambulatory Visit (INDEPENDENT_AMBULATORY_CARE_PROVIDER_SITE_OTHER): Payer: Medicare Other | Admitting: Internal Medicine

## 2013-07-29 VITALS — BP 132/68 | HR 88 | Ht 61.0 in | Wt 176.0 lb

## 2013-07-29 DIAGNOSIS — K220 Achalasia of cardia: Secondary | ICD-10-CM

## 2013-07-29 NOTE — Patient Instructions (Signed)
You have beed referred to [facility] Dr. Larey Brick. They will contact you to set up your appointment.   Follow up with Dr. Rhea Belton in office in 4 months                                              We are excited to introduce MyChart, a new best-in-class service that provides you online access to important information in your electronic medical record. We want to make it easier for you to view your health information - all in one secure location - when and where you need it. We expect MyChart will enhance the quality of care and service we provide.  When you register for MyChart, you can:    View your test results.    Request appointments and receive appointment reminders via email.    Request medication renewals.    View your medical history, allergies, medications and immunizations.    Communicate with your physician's office through a password-protected site.    Conveniently print information such as your medication lists.  To find out if MyChart is right for you, please talk to a member of our clinical staff today. We will gladly answer your questions about this free health and wellness tool.  If you are age 40 or older and want a member of your family to have access to your record, you must provide written consent by completing a proxy form available at our office. Please speak to our clinical staff about guidelines regarding accounts for patients younger than age 64.  As you activate your MyChart account and need any technical assistance, please call the MyChart technical support line at [phone]) or email your question to [email or web address]. If you email your question(s), please include your name, a return phone number and the best time to reach you.  If you have non-urgent health-related questions, you can send a message to our office through MyChart at [email or web address]. If you have a medical emergency, call 911.  Thank you for using MyChart as  your new health and wellness resource!   MyChart licensed from [employer],  1191-4782. Patents Pending.

## 2013-07-29 NOTE — Progress Notes (Signed)
Subjective:    Patient ID: Danielle [name], female    DOB: August 14, 1971, 42 y.o.   MRN: [id number]  HPI Danielle [name] is a 42 yo female with PMH of HIV on antiretrovirals therapy, anemia, asthma, GERD, chronic back pain, anxiety, and swallowing difficulty with chest pain who is seen in followup.  She was initially seen for atypical chest pain in August 2014 and came for an upper endoscopy which was performed on 05/13/2013.  This revealed fluid in the distal esophagus suspicious for dysmotility. Multiple esophageal biopsies were performed on otherwise normal-appearing esophageal mucosa. The stomach and duodenum were unremarkable. Biopsies were benign with no evidence of increased inflammation, infection or dysplasia. Due to her ongoing issues with swallowing and chest pain, esophageal manometry was performed on 06/29/2013. This was consistent with type II achalasia. She returns today to discuss this test result.  She continues to have chest pain which is worse with eating.  She reports difficulty swallowing both liquids and solids, though solids or harder for her. She reports it feels like food sticks in her mid chest which is both painful and anxiety provoking.  She tries to eat small meals which helps a little. She does continue with daily pantoprazole. She denies heartburn, but is having chest discomfort with eating and swallowing. She denies rectal bleeding or melena. No fevers or chills.   Review of Systems As per history of present illness, otherwise negative  Current Medications, Allergies, Past Medical History, Past Surgical History, Family History and Social History were reviewed in [employer] record.     Objective:   Physical Exam BP 132/68  Pulse 88  Ht 5\' 1"  (1.549 m)  Wt 176 lb (79.833 kg)  BMI 33.27 kg/m2  LMP 07/06/2013 Constitutional: Well-developed and well-nourished. No distress. HEENT: Normocephalic and atraumatic. Oropharynx is clear and moist. No  oropharyngeal exudate. Conjunctivae are normal.  No scleral icterus. Cardiovascular: Normal rate, regular rhythm and intact distal pulses.  Pulmonary/chest: Effort normal and breath sounds normal. No wheezing, rales or rhonchi. Abdominal: Soft, nontender, nondistended. Bowel sounds active throughout. Extremities: no clubbing, cyanosis, or edema Neurological: Alert and oriented to person place and time. Psychiatric: Normal mood and affect. Behavior is normal.  Esophageal Manometry -- 06/29/2013 Interpretation / Findings  High-resolution esophageal manometry performed on 10 consecutive swallows All swallows demonstrate abnormal esophageal peristalsis; with 9 of 10 swallows failing completely There is panesophageal pressurization in 70% of swallows The lower esophageal sphincter demonstrates high residual pressure with lack of normal relaxation ; LES mean pressure 18.1  Impressions  1. Abnormal esophageal motility with elevated LES pressure, most consistent with Achalasia, Type II      Assessment & Plan:  42 yo female with PMH of HIV on antiretrovirals therapy, anemia, asthma, GERD, chronic back pain, anxiety, and swallowing difficulty with chest pain who is seen in followup  1.  Achalasia/chest pain -- after manometry testing an upper endoscopy her symptoms are felt secondary to achalasia, subtype II.  Her residual LES pressure is elevated and there was no meaningful peristalsis in the esophageal body. We discussed options for therapy including Botox, pneumatic dilatation and Heller myotomy.  Heller myotomy is felt to give her the best success rate long-term. After thorough discussion she would like to be referred to Gen. surgery to discuss the surgery and learn more about.  I will refer her to Dr. Abbey Chatters for evaluation.  For now she will continue with pantoprazole 40 mg daily. I've advised that she eat soft  foods, take very small bites and chew her food extremely well. I also would like to  remain upright for at least 90 minutes after eating or drinking. I'll see her back in several months after she's had time to consider and pursue surgery.

## 2013-08-03 ENCOUNTER — Telehealth: Payer: Self-pay | Admitting: Gastroenterology

## 2013-08-03 NOTE — Telephone Encounter (Signed)
Per [facility] we cannot refer pt it will have to come from her PCP for insurance purposes.  I spoke to pt. Told her, asked her for her PCP info and I will call his office to see if by talking to Dr. Rhea Belton pt will not have to be seen by PCP he may concur will Dr. Rhea Belton and refer her. Told her I will call her later this afternoon.  Pt verbalized understanding

## 2013-08-04 ENCOUNTER — Ambulatory Visit: Payer: Medicare Other

## 2013-08-04 DIAGNOSIS — F4320 Adjustment disorder, unspecified: Secondary | ICD-10-CM

## 2013-08-04 NOTE — Progress Notes (Signed)
Danielle [name] was in a pleasant mood today, but did report that she has been having some medical issues.  She has a cyst on the palm of her right hand.  She also has GERD and has had a procedure to correct it, which did not seem to help, so she says she is having surgery in December.  She said her husband is back on the road driving a truck, which is a relief to her, since it gives her more time to herself.  Overall, she seems to be doing well.  Plan to see her again in December.

## 2013-08-13 DIAGNOSIS — D492 Neoplasm of unspecified behavior of bone, soft tissue, and skin: Secondary | ICD-10-CM | POA: Diagnosis not present

## 2013-08-17 ENCOUNTER — Other Ambulatory Visit: Payer: Self-pay | Admitting: Infectious Diseases

## 2013-08-18 ENCOUNTER — Ambulatory Visit (INDEPENDENT_AMBULATORY_CARE_PROVIDER_SITE_OTHER): Payer: Medicare Other | Admitting: General Surgery

## 2013-08-18 ENCOUNTER — Ambulatory Visit: Payer: Medicare Other

## 2013-08-18 ENCOUNTER — Encounter (INDEPENDENT_AMBULATORY_CARE_PROVIDER_SITE_OTHER): Payer: Self-pay | Admitting: General Surgery

## 2013-08-18 VITALS — BP 110/60 | HR 80 | Temp 98.7°F | Resp 16 | Ht 61.0 in | Wt 174.6 lb

## 2013-08-18 DIAGNOSIS — F331 Major depressive disorder, recurrent, moderate: Secondary | ICD-10-CM

## 2013-08-18 DIAGNOSIS — K220 Achalasia of cardia: Secondary | ICD-10-CM

## 2013-08-18 DIAGNOSIS — F4320 Adjustment disorder, unspecified: Secondary | ICD-10-CM | POA: Diagnosis not present

## 2013-08-18 NOTE — Progress Notes (Addendum)
Patient ID: Danielle [name], female   DOB: [birth year], 42 y.o.   MRN: [id number]  No chief complaint on file.   HPI Danielle [name] is a 42 y.o. female.   HPI  She is referred by Dr. Rhea Belton further evaluation and treatment of achalasia.  She has progressive dysphagia to solids and liquids. She underwent an upper endoscopy which demonstrated dilated esophagus as well as liquid in the esophagus. This was suspicious for achalasia. Manometry was performed which is consistent with achalasia. She has not had significant weight loss. She does have significant chest discomfort during eating which has been found to be due to her achalasia. The options of dilatation, Botox injection, and Heller myotomy were discussed with her by Dr. Rhea Belton. She is interested in the [facility] myotomy and thus presents here today.  She has HIV. Her CD4 count was last measured at 690 back in September of this year.  She has a nondetectable RNA load.  Past Medical History  Diagnosis Date  . HIV infection   . Anemia   . Asthma   . GERD (gastroesophageal reflux disease)   . Chronic back pain   . Depression   . Bulging lumbar disc   . Anxiety   . Migraines   . Pneumonia     Past Surgical History  Procedure Laterality Date  . Hernia repair    . Other surgical history      biopsy axillary ,right arm  . Cesarean section      twice  . Right arm biopsy    . Esophageal manometry N/A 06/29/2013    Procedure: ESOPHAGEAL MANOMETRY (EM);  Surgeon: Beverley Fiedler, MD;  Location: [facility];  Service: Gastroenterology;  Laterality: N/A;    Family History  Problem Relation Age of Onset  . Hyperlipidemia Mother   . Diabetes Mother   . Stroke Mother   . Diabetes Brother   . Colon cancer Father   . Esophageal cancer Neg Hx   . Rectal cancer Neg Hx   . Stomach cancer Neg Hx     Social History History  Substance Use Topics  . Smoking status: Never Smoker   . Smokeless tobacco: Never Used  . Alcohol Use: No     Allergies  Allergen Reactions  . Latex Rash    Current Outpatient Prescriptions  Medication Sig Dispense Refill  . DULoxetine HCl (CYMBALTA PO) Take 10 mg by mouth daily.      . MELOXICAM PO Take by mouth daily.      Marland Kitchen albuterol (PROVENTIL,VENTOLIN) 90 MCG/ACT inhaler Inhale 2 puffs into the lungs daily as needed for wheezing or shortness of breath.       . cyclobenzaprine (FLEXERIL) 10 MG tablet As directed      . dolutegravir (TIVICAY) 50 MG tablet Take 50 mg by mouth at bedtime.      Marland Kitchen HYDROcodone-acetaminophen (NORCO/VICODIN) 5-325 MG per tablet Take 1 tablet by mouth every 6 (six) hours as needed for pain.      Marland Kitchen oxybutynin (DITROPAN-XL) 10 MG 24 hr tablet Take 10 mg by mouth daily.       . pantoprazole (PROTONIX) 40 MG tablet Take 1 tablet (40 mg total) by mouth daily.  90 tablet  3  . ranitidine (ZANTAC) 150 MG tablet TAKE 1 TABLET BY MOUTH QHS--TAKE 12 HOURS APART FROM REYETAZ  30 tablet  0  . traZODone (DESYREL) 50 MG tablet As directed      . TRUVADA 200-300 MG per tablet  TAKE ONE TABLET BY MOUTH ONE TIME DAILY  30 tablet  5   No current facility-administered medications for this visit.    Review of Systems Review of Systems  Constitutional: Negative.   Respiratory: Negative.   Cardiovascular: Positive for chest pain.  Gastrointestinal:       She does regurgitate food at times.  Genitourinary: Negative.   Musculoskeletal: Positive for back pain (chronic).  Neurological: Positive for headaches.    Blood pressure 110/60, pulse 80, temperature 98.7 F (37.1 C), temperature source Temporal, resp. rate 16, height 5\' 1"  (1.549 m), weight 174 lb 9.6 oz (79.198 kg).  Physical Exam Physical Exam  Constitutional: No distress.  Overweight female.  HENT:  Head: Normocephalic and atraumatic.  Eyes: EOM are normal. No scleral icterus.  Neck: Neck supple.  Cardiovascular: Normal rate and regular rhythm.   Pulmonary/Chest: Effort normal and breath sounds normal.   Abdominal: Soft. She exhibits no distension and no mass. There is no tenderness.  Vertical midline scar beginning just above the umbilicus and extending down to the pubis.  Lymphadenopathy:    She has no cervical adenopathy.    Data Reviewed Dr. Rhea Belton note.  Dr. Moshe Cipro note.  Assessment    #1. Achalasia-she been given the options and would like to proceed with Heller myotomy.  2. HIV positive-well controlled on medical therapy.     Plan    Barium swallow for a baseline study.  Laparoscopic Heller myotomy with upper endoscopy and Dor fundoplication.   The procedure and risks of the operation were explained. Risks include but are not limited to bleeding, infection, wound healing problems, anesthesia, esophageal perforation, injury to the stomach/colon/spleen/liver, failure of operation to improve the symptoms, reflux disease, dysphagia to some foods, recurrence. We talked about importance of eating certain types of foods and certain behavioral changes with respect to diet. She seemed to understand all of this and wants to proceed.       Zully Frane J 08/18/2013, 2:13 PM

## 2013-08-18 NOTE — Patient Instructions (Signed)
Achalasia Achalasia is a condition in which a person cannot get food through the lower esophagus. This is the tube that carries food from your mouth to your stomach. This happens because there are no nerves to the lower esophagus and the esophageal sphincter. This is the circular muscle between the stomach and esophagus that relaxes to allow food into the stomach. It then contracts to keep food in the stomach. This absence of nerves may be present since birth. This condition causes difficulty swallowing, chest pain, and food coming back up in the mouth after being swallowed. DIAGNOSIS  This condition is diagnosed by x-ray, pressure studies in the esophagus, and endoscopy. This is when your caregiver looks into your esophagus with a small flexible telescope. The portion of the esophagus above the narrowing is usually enlarged when the condition is present. TREATMENT   Soft diets are helpful.  Medications will help the food pass more easily into the stomach.  If conservative treatment (as above) does not work, stretching the end of the esophagus with a balloon may help.  Sometimes surgical treatment is used and a segment of esophagus is removed. SEEK IMMEDIATE MEDICAL CARE IF:  You are unable to keep fluids down or it feels as though food sticks in your chest area.  Vomiting becomes persistent.  Chest or belly pain develops, increases, or localizes.  You have a fever.    [facility]   EATING AFTER YOUR ESOPHAGEAL SURGERY  After your esophageal surgery, you can expect some difficulty swallowing.  If food sticks when you eat, it is called "dysphagia".  This is due to swelling around your surgery site and will most likely resolve within a few weeks.  To help you through this temporary phase, we start you out on a pureed diet.  Your first meal in the hospital was clear liquids.  You should have been given a pureed diet by the time you left the hospital.  We ask patients  to stay on a pureed diet for the first two weeks to avoid anything getting "stuck" near your recent surgery.  Don't be alarmed if your ability to swallow doesn't progress according to this plan.  Everyone is different and some take longer or shorter.  Use common sense.  If you are having trouble swallowing a particular food, then avoid it.  If food is sticking when you advance your diet, go back to the previous day or two.  In general some simple rules to follow are:  Maintain an upright position (as near 90 degrees as possible) whenever eating or drinking.  Take small bites - only 1/2 to 1 teaspoon at a time.  Eat slowly.  It may also help to eat only one food at a time.  Avoid talking while eating.  Do not mix solid foods and liquids in the same mouthful and do not "wash foods down" with liquids, unless you have been instructed to do so by your surgeon.  Eat in a relaxed atmosphere, with no distractions.  Following each meal, sit in an upright position (90 degree angle) for 60-90 minutes.  Avoid carbonated (bubbly) drinks. Do not use straws.  If food does stick, don't panic.  Try to relax and let the food pass on its own.  Sipping strong hot black tea can also help.  If you have any questions please call our office at [phone].   LEVEL 1 PUREED FOODS:  1ST 2 WEEKS AFTER SURGERY Foods in this group are pureed or  blenderized to a smooth, mashed potato-like consistency.  If necessary, the pureed foods can keep their shape with the addition of a thickening agent.  Meat should be pureed to a smooth pasty consistency.  Hot broth or gravy may be added to the pureed meat, approximately 1 oz. of liquid per 3 oz. serving of meat. CAUTION:  If any foods do not puree into a smooth consistency, it may make eating for swallowing more difficult.  For example, zucchini seeds sometimes do not blend well. Hot Foods Cold Foods  Pureed scrambled eggs and cheese Pureed cottage cheese  Baby cereals  Thickened juices and nectars  Thinned cooked cereals (no lumps) Thickened milk or eggnog  Pureed [place] toast or pancakes Ensure  Mashed potatoes Ice cream  Pureed parsley, au gratin, scalloped potatoes, candied sweet potatoes Fruit or [place] ice, sherbet  Pureed buttered or alfredo noodles Plain yogurt  Pureed vegetables (no corn or peas) Instant breakfast  Pureed soups and creamed soups Smooth pudding, mousse, custard  Pureed scalloped apples Whipped gelatin  Gravies Sugar, syrup, honey, jelly  Sauces, cheese, tomato, barbecue, white, creamed Cream  Any baby food Creamer  Alcohol in moderation (not beer or champagne) Margarine  Coffee or tea Mayonnaise   Ketchup, mustard   Apple sauce   SAMPLE MENU:  PUREED DIET Breakfast Lunch Dinner   Orange juice, 1/2 cup  Cream of wheat, 1/2 cup  Pineapple juice, 1/2 cup  Pureed [place], barley soup, 3/4 cup  Pureed Hawaiian chicken, 3 oz   Scrambled eggs, mashed or blended with cheese, 1/2 cup  Tea or coffee, 1 cup   Whole milk, 1 cup   Non-dairy creamer, 2 Tbsp.  Mashed potatoes, 1/2 cup  Pureed cooled broccoli, 1/2 cup  Apple sauce, 1/2 cup  Coffee or tea  Mashed potatoes, 1/2 cup  Pureed spinach, 1/2 cup  Frozen yogurt, 1/2 cup  Tea or coffee    LEVEL 2 After your first 2 weeks, you can advance to a soft diet.  Keep on this diet until everything goes down easily. Hot Foods Cold Foods  White fish Cottage cheese  Stuffed fish Junior baby fruit  Baby food meals Semi thickened juices  Minced soft cooked, scrambled, poached eggs nectars  Souffle & omelets Ripe mashed bananas  Cooked cereals Canned fruit, pineapple sauce, milk  potatoes Milkshake  Buttered or Alfredo noodles Custard  Cooked cooled vegetable Puddings, including tapioca  Sherbet Yogurt  Vegetable soup or alphabet soup Fruit ice, [place] ice  Gravies Whipped gelatin  Sugar, syrup, honey, jelly Junior baby desserts  Sauces:  Cheese, creamed,  barbecue, tomato, white Cream  Coffee or tea Margarine   SAMPLE MENU:  LEVEL 2 Breakfast Lunch Dinner   Orange juice, 1/2 cup  Oatmeal, 1/2 cup  Scrambled eggs with cheese, 1/2 cup  Decaffeinated tea, 1 cup  Whole milk, 1 cup  Non-dairy creamer, 2 Tbsp  Pineapple juice, 1/2 cup  Minced beef, 3 oz  Gravy, 2 Tbsp  Mashed potatoes, 1/2 cup  Minced fresh broccoli, 1/2 cup  Applesauce, 1/2 cup  Coffee, 1 cup  [place], barley soup, 3/4 cup  Minced Hawaiian chicken, 3 oz  Mashed potatoes, 1/2 cup  Cooked spinach, 1/2 cup  Frozen yogurt, 1/2 cup  Non-dairy creamer, 2 Tbsp    LEVEL 3 After all the foods in level 2 (soft diet) are passing through well you should advance up to the next level.  It is still important to cut these foods into small pieces  and eat slowly. Hot Foods Cold Foods  Poultry Cottage cheese  Chopped Swedish meatballs Yogurt  Meat salads (ground or flaked meat) Milk  Flaked fish (tuna) Milkshakes  Poached or scrambled eggs Soft, cold, dry cereal  Souffles and omelets Fruit juices or nectars  Cooked cereals Chopped canned fruit  Chopped [place] toast or pancakes Canned fruit cocktail  Noodles or pasta (no rice) Pudding, mousse, custard  Cooked vegetables (no frozen peas, corn, or mixed vegetables) Green salad  Canned small sweet peas Ice cream  Creamed soup or vegetable soup Fruit ice, [place] ice  Pureed vegetable soup or alphabet soup Non-dairy creamer  Ground scalloped apples Margarine  Gravies Mayonnaise  Sauces:  Cheese, creamed, barbecue, tomato, white Ketchup  Coffee or tea Mustard   SAMPLE MENU:  LEVEL 3 Breakfast Lunch Dinner   Orange juice, 1/2 cup  Oatmeal, 1/2 cup  Scrambled eggs with cheese, 1/2 cup  Decaffeinated tea, 1 cup  Whole milk, 1 cup  Non-dairy creamer, 2 Tbsp  Ketchup, 1 Tbsp  Margarine, 1 tsp  Salt, 1/4 tsp  Sugar, 2 tsp  Pineapple juice, 1/2 cup  Ground beef, 3 oz  Gravy, 2 Tbsp  Mashed  potatoes, 1/2 cup  Cooked spinach, 1/2 cup  Applesauce, 1/2 cup  Decaffeinated coffee  Whole milk  Non-dairy creamer, 2 Tbsp  Margarine, 1 tsp  Salt, 1/4 tsp  Pureed [place], barley soup, 3/4 cup  Barbecue chicken, 3 oz  Mashed potatoes, 1/2 cup  Ground fresh broccoli, 1/2 cup  Frozen yogurt, 1/2 cup  Decaffeinated tea, 1 cup  Non-dairy creamer, 2 Tbsp  Margarine, 1 tsp  Salt, 1/4 tsp  Sugar, 1 tsp    LEVEL 4:  REGULAR FOODS Foods in this group are soft, moist, regularly textured foods.  This level includes red meat and breads, which tend to be the hardest things to swallow.  Eat very slow, chew well and continue to avoid carbonated drinks. Hot Foods Cold Foods  Baked fish or skinned Soft cheeses - cottage cheese  Souffles and omelets Cream cheese  Eggs Yogurt  Stuffed shells Milk  Spaghetti with meat sauce Milkshakes  Cooked cereal Cold dry cereals (no nuts, dried fruit, coconut)  [place] toast or pancakes Crackers  Buttered toast Fruit juices or nectars  Noodles or pasta (no rice) Canned fruit  Potatoes (all types) Ripe bananas  Soft, cooked vegetables (no corn, lima, or baked beans) Peeled, ripe, fresh fruit  Creamed soups or vegetable soup Cakes (no nuts, dried fruit, coconut)  Canned chicken noodle soup Plain doughnuts  Gravies Ice cream  Bacon dressing Pudding, mousse, custard  Sauces:  Cheese, creamed, barbecue, tomato, white Fruit ice, [place] ice, sherbet  Decaffeinated tea or coffee Whipped gelatin  Pork chops Regular gelatin   Canned fruited gelatin molds   Sugar, syrup, honey, jam, jelly   Cream   Non-dairy   Margarine   Oil   Mayonnaise   Ketchup   Mustard

## 2013-08-18 NOTE — Progress Notes (Signed)
Danielle [name] introduced me to her husband, though he did not join [place] in the session.  She talked at length about Thanksgiving and the disagreement they had over his mother coming to their house and saying inappropriate things to their 40 year old daughter ("with a figure like that you're gonna have grown men after you").  Her husband did not say anything to his mother, she said, and that upset her.  So he left and they had an argument via texting.  She reported that her PCP has prescribed Cymbalta, which she has been taking for one week.  She asked about a psychiatrist and I gave her the number for [facility] and told her to ask for an outpatient psychiatrist.  Plan to meet in January, but she was told that if something comes up she can call [facility] and be worked in to the schedule.

## 2013-08-20 ENCOUNTER — Other Ambulatory Visit (INDEPENDENT_AMBULATORY_CARE_PROVIDER_SITE_OTHER): Payer: Self-pay | Admitting: General Surgery

## 2013-08-20 ENCOUNTER — Telehealth (INDEPENDENT_AMBULATORY_CARE_PROVIDER_SITE_OTHER): Payer: Self-pay | Admitting: *Deleted

## 2013-08-20 DIAGNOSIS — K220 Achalasia of cardia: Secondary | ICD-10-CM

## 2013-08-20 NOTE — Telephone Encounter (Signed)
Pt returned my call, appt information below was given.  Pt agreeable with this plan.

## 2013-08-20 NOTE — Telephone Encounter (Signed)
LMOM for pt to return my call.  I was calling to notify her of the appt for her Esophagram on 12/15 with an arrival time of 10:45am at [phone].

## 2013-08-24 ENCOUNTER — Ambulatory Visit
Admission: RE | Admit: 2013-08-24 | Discharge: 2013-08-24 | Disposition: A | Discharge status: Home / Self Care | Payer: Medicare Other | Source: Ambulatory Visit | Attending: General Surgery | Admitting: General Surgery

## 2013-08-24 DIAGNOSIS — K220 Achalasia of cardia: Secondary | ICD-10-CM

## 2013-09-17 ENCOUNTER — Encounter (HOSPITAL_COMMUNITY): Payer: Self-pay | Admitting: Pharmacy Technician

## 2013-09-18 ENCOUNTER — Other Ambulatory Visit (INDEPENDENT_AMBULATORY_CARE_PROVIDER_SITE_OTHER): Payer: Self-pay | Admitting: General Surgery

## 2013-09-18 ENCOUNTER — Telehealth (INDEPENDENT_AMBULATORY_CARE_PROVIDER_SITE_OTHER): Payer: Self-pay

## 2013-09-18 NOTE — Telephone Encounter (Signed)
[facility] [phone]  is asking for orders to be put in [place] on patient.

## 2013-09-19 ENCOUNTER — Other Ambulatory Visit: Payer: Self-pay | Admitting: Infectious Diseases

## 2013-09-21 ENCOUNTER — Inpatient Hospital Stay (HOSPITAL_COMMUNITY): Admission: RE | Admit: 2013-09-21 | Payer: Medicare Other | Source: Ambulatory Visit

## 2013-09-29 ENCOUNTER — Encounter (HOSPITAL_COMMUNITY): Admission: RE | Payer: Self-pay | Source: Ambulatory Visit

## 2013-09-29 ENCOUNTER — Ambulatory Visit: Payer: Medicare Other

## 2013-09-29 ENCOUNTER — Ambulatory Visit (HOSPITAL_COMMUNITY): Admission: RE | Admit: 2013-09-29 | Payer: Medicare Other | Source: Ambulatory Visit | Admitting: General Surgery

## 2013-09-29 DIAGNOSIS — F331 Major depressive disorder, recurrent, moderate: Secondary | ICD-10-CM | POA: Diagnosis not present

## 2013-09-29 DIAGNOSIS — F330 Major depressive disorder, recurrent, mild: Secondary | ICD-10-CM

## 2013-09-29 SURGERY — ESOPHAGOMYOTOMY, LAPAROSCOPIC, HELLER
Anesthesia: General

## 2013-09-29 NOTE — Progress Notes (Signed)
Danielle [name] reported today that she is having surgery on her esophagus later this month and went into detail of what will be done.  She expressed some fear and concern about this, but is going through with it nonetheless.  Then she shifted and talked about some of her frustrations with her mother in law.  She said her husband's grandmother died just before Christmas and there were some negative things said to [place] by his mother, possibly because she did not attend the funeral.  She explained that she doesn't go to funerals at all, no matter who's it is.  Plan to meet in one week.

## 2013-09-30 ENCOUNTER — Encounter (HOSPITAL_COMMUNITY): Payer: Self-pay | Admitting: Pharmacy Technician

## 2013-10-01 ENCOUNTER — Other Ambulatory Visit (HOSPITAL_COMMUNITY): Payer: Medicare Other

## 2013-10-02 ENCOUNTER — Encounter (HOSPITAL_COMMUNITY)
Admission: RE | Admit: 2013-10-02 | Discharge: 2013-10-02 | Disposition: A | Discharge status: Home / Self Care | Payer: Medicare Other | Source: Ambulatory Visit | Attending: General Surgery | Admitting: General Surgery

## 2013-10-02 ENCOUNTER — Encounter (HOSPITAL_COMMUNITY): Payer: Self-pay

## 2013-10-02 DIAGNOSIS — K220 Achalasia of cardia: Secondary | ICD-10-CM | POA: Insufficient documentation

## 2013-10-02 DIAGNOSIS — Z0183 Encounter for blood typing: Secondary | ICD-10-CM | POA: Diagnosis not present

## 2013-10-02 DIAGNOSIS — Z01812 Encounter for preprocedural laboratory examination: Secondary | ICD-10-CM | POA: Diagnosis not present

## 2013-10-02 LAB — CBC WITH DIFFERENTIAL/PLATELET
Basophils Absolute: 0 10*3/uL (ref 0.0–0.1)
Basophils Relative: 1 % (ref 0–1)
EOS PCT: 1 % (ref 0–5)
Eosinophils Absolute: 0.1 10*3/uL (ref 0.0–0.7)
HCT: 29.1 % — ABNORMAL LOW (ref 36.0–46.0)
Hemoglobin: 8.7 g/dL — ABNORMAL LOW (ref 12.0–15.0)
LYMPHS ABS: 2.4 10*3/uL (ref 0.7–4.0)
Lymphocytes Relative: 42 % (ref 12–46)
MCH: 20.9 pg — AB (ref 26.0–34.0)
MCHC: 29.9 g/dL — ABNORMAL LOW (ref 30.0–36.0)
MCV: 70 fL — AB (ref 78.0–100.0)
MONO ABS: 0.4 10*3/uL (ref 0.1–1.0)
Monocytes Relative: 7 % (ref 3–12)
Neutro Abs: 2.8 10*3/uL (ref 1.7–7.7)
Neutrophils Relative %: 50 % (ref 43–77)
PLATELETS: 321 10*3/uL (ref 150–400)
RBC: 4.16 MIL/uL (ref 3.87–5.11)
RDW: 17.4 % — AB (ref 11.5–15.5)
WBC: 5.7 10*3/uL (ref 4.0–10.5)

## 2013-10-02 LAB — COMPREHENSIVE METABOLIC PANEL
ALT: 21 U/L (ref 0–35)
AST: 20 U/L (ref 0–37)
Albumin: 3.8 g/dL (ref 3.5–5.2)
Alkaline Phosphatase: 88 U/L (ref 39–117)
BUN: 17 mg/dL (ref 6–23)
CO2: 24 mEq/L (ref 19–32)
Calcium: 9 mg/dL (ref 8.4–10.5)
Chloride: 105 mEq/L (ref 96–112)
Creatinine, Ser: 1.11 mg/dL — ABNORMAL HIGH (ref 0.50–1.10)
GFR calc Af Amer: 71 mL/min — ABNORMAL LOW (ref >90)
GFR calc non Af Amer: 61 mL/min — ABNORMAL LOW (ref >90)
Glucose, Bld: 97 mg/dL (ref 70–99)
Potassium: 3.9 mEq/L (ref 3.7–5.3)
SODIUM: 141 meq/L (ref 137–147)
TOTAL PROTEIN: 7.9 g/dL (ref 6.0–8.3)
Total Bilirubin: 0.2 mg/dL — ABNORMAL LOW (ref 0.3–1.2)

## 2013-10-02 LAB — ABO/RH: ABO/RH(D): O POS

## 2013-10-02 LAB — PROTIME-INR
INR: 0.96 (ref 0.00–1.49)
PROTHROMBIN TIME: 12.6 s (ref 11.6–15.2)

## 2013-10-02 LAB — HCG, SERUM, QUALITATIVE: Preg, Serum: NEGATIVE

## 2013-10-02 NOTE — Progress Notes (Signed)
Faxed  CBC to Dr Zella Richer via [facility]

## 2013-10-02 NOTE — Progress Notes (Signed)
Chest x ray, EKG 9/14 [facility] 

## 2013-10-02 NOTE — Patient Instructions (Signed)
[place]  10/02/2013   Your procedure is scheduled on:  10/08/13  THURSDAY  Report to [facility] at 0700      AM.  Call this number if you have problems the morning of surgery: [phone]       Remember:   Do not eat food  Or drink :After Midnight. Wednesday NIGHT   Take these medicines the morning of surgery with A SIP OF WATER: Duloxetine, Pantoprazole                                     May take [facility] if needed   .  Contacts, dentures or partial plates can not be worn to surgery  Leave suitcase in the car. After surgery it may be brought to your room.  For patients admitted to the hospital, checkout time is 11:00 AM day of  discharge.             SPECIAL INSTRUCTIONS- SEE [facility] PREPARING FOR SURGERY INSTRUCTION SHEET-     DO NOT WEAR JEWELRY, LOTIONS, POWDERS, OR PERFUMES.  WOMEN-- DO NOT SHAVE LEGS OR UNDERARMS FOR 12 HOURS BEFORE SHOWERS. MEN MAY SHAVE FACE.  Patients discharged the day of surgery will not be allowed to drive home. IF going home the day of surgery, you must have a driver and someone to stay with you for the first 24 hours  Name and phone number of your driver:    husband at discharge                                                                       Please read over the following fact sheets that you were given:  [facility]  PST [phone]  [phone]                 [place]                                                  Patient Signature _____________________________

## 2013-10-06 ENCOUNTER — Ambulatory Visit: Payer: Medicare Other

## 2013-10-06 DIAGNOSIS — F331 Major depressive disorder, recurrent, moderate: Secondary | ICD-10-CM

## 2013-10-06 NOTE — Progress Notes (Signed)
Jilliane was hyper-verbal again today, reporting on an argument recently between her and her husband that started when he was dropping her off for her pre-op appointment (she is scheduled for surgery this week on her esophagus).  She went into long detail about how angry he got and how she responded by ignoring him for 2 days.  She said he tried to apologize, and explained that he was in a bad mood due to his injured hand, but then asked for her to apologize but she refused, citing that she didn't think she had done anything wrong.  I asked her if she would be willing to have him join her in her next session and if he would agree.  She said she didn't think he would do it.  I then suggested he go to [facility], since it sounds like he is struggling with being on short term disability (due to an injury to his wrist/hand), but she said she doubted he would do this either.  Plan to meet in 4 weeks or sooner if the schedule permits.

## 2013-10-08 ENCOUNTER — Inpatient Hospital Stay (HOSPITAL_COMMUNITY)
Admission: RE | Admit: 2013-10-08 | Discharge: 2013-10-13 | DRG: 326 | Disposition: A | Discharge status: Home / Self Care | Payer: Medicare Other | Source: Ambulatory Visit | Attending: General Surgery | Admitting: General Surgery

## 2013-10-08 ENCOUNTER — Encounter (HOSPITAL_COMMUNITY): Payer: Self-pay | Admitting: *Deleted

## 2013-10-08 ENCOUNTER — Encounter (HOSPITAL_COMMUNITY): Payer: Medicare Other | Admitting: Anesthesiology

## 2013-10-08 ENCOUNTER — Ambulatory Visit (HOSPITAL_COMMUNITY): Payer: Medicare Other | Admitting: Anesthesiology

## 2013-10-08 ENCOUNTER — Encounter (HOSPITAL_COMMUNITY)
Admission: RE | Disposition: A | Discharge status: Home / Self Care | Payer: Self-pay | Source: Ambulatory Visit | Attending: General Surgery

## 2013-10-08 DIAGNOSIS — K7689 Other specified diseases of liver: Secondary | ICD-10-CM | POA: Diagnosis present

## 2013-10-08 DIAGNOSIS — K219 Gastro-esophageal reflux disease without esophagitis: Secondary | ICD-10-CM | POA: Diagnosis not present

## 2013-10-08 DIAGNOSIS — Z800 Family history of malignant neoplasm of digestive organs: Secondary | ICD-10-CM

## 2013-10-08 DIAGNOSIS — D509 Iron deficiency anemia, unspecified: Secondary | ICD-10-CM | POA: Diagnosis present

## 2013-10-08 DIAGNOSIS — D259 Leiomyoma of uterus, unspecified: Secondary | ICD-10-CM | POA: Diagnosis not present

## 2013-10-08 DIAGNOSIS — R933 Abnormal findings on diagnostic imaging of other parts of digestive tract: Secondary | ICD-10-CM

## 2013-10-08 DIAGNOSIS — K220 Achalasia of cardia: Principal | ICD-10-CM | POA: Diagnosis present

## 2013-10-08 DIAGNOSIS — R52 Pain, unspecified: Secondary | ICD-10-CM | POA: Diagnosis not present

## 2013-10-08 DIAGNOSIS — Z91040 Latex allergy status: Secondary | ICD-10-CM | POA: Diagnosis not present

## 2013-10-08 DIAGNOSIS — F329 Major depressive disorder, single episode, unspecified: Secondary | ICD-10-CM | POA: Diagnosis present

## 2013-10-08 DIAGNOSIS — Z01812 Encounter for preprocedural laboratory examination: Secondary | ICD-10-CM

## 2013-10-08 DIAGNOSIS — R109 Unspecified abdominal pain: Secondary | ICD-10-CM | POA: Diagnosis not present

## 2013-10-08 DIAGNOSIS — Z79899 Other long term (current) drug therapy: Secondary | ICD-10-CM | POA: Diagnosis not present

## 2013-10-08 DIAGNOSIS — D649 Anemia, unspecified: Secondary | ICD-10-CM | POA: Diagnosis not present

## 2013-10-08 DIAGNOSIS — L909 Atrophic disorder of skin, unspecified: Secondary | ICD-10-CM | POA: Diagnosis present

## 2013-10-08 DIAGNOSIS — Z791 Long term (current) use of non-steroidal anti-inflammatories (NSAID): Secondary | ICD-10-CM | POA: Diagnosis not present

## 2013-10-08 DIAGNOSIS — J45909 Unspecified asthma, uncomplicated: Secondary | ICD-10-CM | POA: Diagnosis present

## 2013-10-08 DIAGNOSIS — K567 Ileus, unspecified: Secondary | ICD-10-CM

## 2013-10-08 DIAGNOSIS — Z21 Asymptomatic human immunodeficiency virus [HIV] infection status: Secondary | ICD-10-CM | POA: Diagnosis not present

## 2013-10-08 DIAGNOSIS — K560 Paralytic ileus: Secondary | ICD-10-CM | POA: Diagnosis not present

## 2013-10-08 DIAGNOSIS — G8929 Other chronic pain: Secondary | ICD-10-CM | POA: Diagnosis present

## 2013-10-08 DIAGNOSIS — B20 Human immunodeficiency virus [HIV] disease: Secondary | ICD-10-CM | POA: Diagnosis not present

## 2013-10-08 DIAGNOSIS — L919 Hypertrophic disorder of the skin, unspecified: Secondary | ICD-10-CM

## 2013-10-08 DIAGNOSIS — K929 Disease of digestive system, unspecified: Secondary | ICD-10-CM | POA: Diagnosis not present

## 2013-10-08 DIAGNOSIS — K2289 Other specified disease of esophagus: Secondary | ICD-10-CM | POA: Diagnosis not present

## 2013-10-08 DIAGNOSIS — K56609 Unspecified intestinal obstruction, unspecified as to partial versus complete obstruction: Secondary | ICD-10-CM | POA: Diagnosis not present

## 2013-10-08 DIAGNOSIS — J9819 Other pulmonary collapse: Secondary | ICD-10-CM | POA: Diagnosis not present

## 2013-10-08 DIAGNOSIS — R141 Gas pain: Secondary | ICD-10-CM | POA: Diagnosis not present

## 2013-10-08 DIAGNOSIS — J90 Pleural effusion, not elsewhere classified: Secondary | ICD-10-CM | POA: Diagnosis not present

## 2013-10-08 DIAGNOSIS — F3289 Other specified depressive episodes: Secondary | ICD-10-CM | POA: Diagnosis present

## 2013-10-08 DIAGNOSIS — R1310 Dysphagia, unspecified: Secondary | ICD-10-CM | POA: Diagnosis present

## 2013-10-08 DIAGNOSIS — F411 Generalized anxiety disorder: Secondary | ICD-10-CM | POA: Diagnosis present

## 2013-10-08 DIAGNOSIS — M549 Dorsalgia, unspecified: Secondary | ICD-10-CM | POA: Diagnosis not present

## 2013-10-08 DIAGNOSIS — K9189 Other postprocedural complications and disorders of digestive system: Secondary | ICD-10-CM

## 2013-10-08 HISTORY — PX: HELLER MYOTOMY: SHX5259

## 2013-10-08 HISTORY — PX: UPPER GI ENDOSCOPY: SHX6162

## 2013-10-08 LAB — TYPE AND SCREEN
ABO/RH(D): O POS
ANTIBODY SCREEN: NEGATIVE

## 2013-10-08 SURGERY — ESOPHAGOMYOTOMY, LAPAROSCOPIC, HELLER
Anesthesia: General

## 2013-10-08 MED ORDER — PROPOFOL 10 MG/ML IV BOLUS
INTRAVENOUS | Status: DC | PRN
  Administered 2013-10-08: 200 mg via INTRAVENOUS

## 2013-10-08 MED ORDER — FENTANYL CITRATE 0.05 MG/ML IJ SOLN
INTRAMUSCULAR | Status: AC
  Filled 2013-10-08: qty 5

## 2013-10-08 MED ORDER — BUPIVACAINE-EPINEPHRINE 0.5% -1:200000 IJ SOLN
INTRAMUSCULAR | Status: DC | PRN
  Administered 2013-10-08: 15 mL

## 2013-10-08 MED ORDER — HYDROMORPHONE HCL PF 2 MG/ML IJ SOLN
INTRAMUSCULAR | Status: AC
  Filled 2013-10-08: qty 1

## 2013-10-08 MED ORDER — LIDOCAINE HCL (CARDIAC) 20 MG/ML IV SOLN
INTRAVENOUS | Status: DC | PRN
  Administered 2013-10-08: 100 mg via INTRAVENOUS

## 2013-10-08 MED ORDER — ROCURONIUM BROMIDE 100 MG/10ML IV SOLN
INTRAVENOUS | Status: DC | PRN
  Administered 2013-10-08: 40 mg via INTRAVENOUS
  Administered 2013-10-08 (×2): 10 mg via INTRAVENOUS

## 2013-10-08 MED ORDER — HYDROMORPHONE HCL PF 1 MG/ML IJ SOLN: 0.2500 mg | INTRAMUSCULAR | Status: DC | PRN

## 2013-10-08 MED ORDER — LIDOCAINE HCL (CARDIAC) 20 MG/ML IV SOLN
INTRAVENOUS | Status: AC
  Filled 2013-10-08: qty 5

## 2013-10-08 MED ORDER — GLYCOPYRROLATE 0.2 MG/ML IJ SOLN
INTRAMUSCULAR | Status: AC
  Filled 2013-10-08: qty 3

## 2013-10-08 MED ORDER — DEXAMETHASONE SODIUM PHOSPHATE 10 MG/ML IJ SOLN
INTRAMUSCULAR | Status: AC
  Filled 2013-10-08: qty 1

## 2013-10-08 MED ORDER — MORPHINE SULFATE 2 MG/ML IJ SOLN
2.0000 mg | INTRAMUSCULAR | Status: DC | PRN
  Administered 2013-10-08 (×2): 2 mg via INTRAVENOUS
  Administered 2013-10-08 – 2013-10-10 (×8): 4 mg via INTRAVENOUS
  Administered 2013-10-11 – 2013-10-12 (×3): 2 mg via INTRAVENOUS
  Filled 2013-10-08 (×3): qty 2
  Filled 2013-10-08 (×2): qty 1
  Filled 2013-10-08 (×4): qty 2
  Filled 2013-10-08: qty 1
  Filled 2013-10-08: qty 2
  Filled 2013-10-08 (×2): qty 1

## 2013-10-08 MED ORDER — HEPARIN SODIUM (PORCINE) 5000 UNIT/ML IJ SOLN
5000.0000 [IU] | Freq: Three times a day (TID) | INTRAMUSCULAR | Status: DC
  Administered 2013-10-09 – 2013-10-13 (×13): 5000 [IU] via SUBCUTANEOUS
  Filled 2013-10-08 (×16): qty 1

## 2013-10-08 MED ORDER — PANTOPRAZOLE SODIUM 40 MG IV SOLR
40.0000 mg | Freq: Two times a day (BID) | INTRAVENOUS | Status: DC
  Administered 2013-10-08 – 2013-10-13 (×10): 40 mg via INTRAVENOUS
  Filled 2013-10-08 (×11): qty 40

## 2013-10-08 MED ORDER — ONDANSETRON HCL 4 MG/2ML IJ SOLN
INTRAMUSCULAR | Status: AC
  Filled 2013-10-08: qty 2

## 2013-10-08 MED ORDER — NEOSTIGMINE METHYLSULFATE 1 MG/ML IJ SOLN
INTRAMUSCULAR | Status: DC | PRN
  Administered 2013-10-08: 4 mg via INTRAVENOUS

## 2013-10-08 MED ORDER — HYDROMORPHONE HCL PF 1 MG/ML IJ SOLN
INTRAMUSCULAR | Status: DC | PRN
  Administered 2013-10-08 (×2): 1 mg via INTRAVENOUS

## 2013-10-08 MED ORDER — LACTATED RINGERS IV SOLN
INTRAVENOUS | Status: DC
  Administered 2013-10-08 (×2): via INTRAVENOUS
  Administered 2013-10-08: 1000 mL via INTRAVENOUS

## 2013-10-08 MED ORDER — FENTANYL CITRATE 0.05 MG/ML IJ SOLN
INTRAMUSCULAR | Status: AC
  Filled 2013-10-08: qty 2

## 2013-10-08 MED ORDER — CEFAZOLIN SODIUM-DEXTROSE 2-3 GM-% IV SOLR
2.0000 g | INTRAVENOUS | Status: AC
  Administered 2013-10-08: 2 g via INTRAVENOUS

## 2013-10-08 MED ORDER — KCL-LACTATED RINGERS-D5W 20 MEQ/L IV SOLN
INTRAVENOUS | Status: DC
  Administered 2013-10-08 – 2013-10-09 (×3): via INTRAVENOUS
  Administered 2013-10-10: 100 mL/h via INTRAVENOUS
  Administered 2013-10-10 – 2013-10-11 (×3): via INTRAVENOUS
  Administered 2013-10-11: 100 mL/h via INTRAVENOUS
  Administered 2013-10-12 – 2013-10-13 (×3): via INTRAVENOUS
  Filled 2013-10-08 (×17): qty 1000

## 2013-10-08 MED ORDER — BIOTENE DRY MOUTH MT LIQD
15.0000 mL | Freq: Two times a day (BID) | OROMUCOSAL | Status: DC
  Administered 2013-10-09 – 2013-10-12 (×7): 15 mL via OROMUCOSAL

## 2013-10-08 MED ORDER — CEFAZOLIN SODIUM 1-5 GM-% IV SOLN
1.0000 g | Freq: Four times a day (QID) | INTRAVENOUS | Status: AC
  Administered 2013-10-08 – 2013-10-09 (×3): 1 g via INTRAVENOUS
  Filled 2013-10-08 (×5): qty 50

## 2013-10-08 MED ORDER — GLYCOPYRROLATE 0.2 MG/ML IJ SOLN
INTRAMUSCULAR | Status: DC | PRN
  Administered 2013-10-08: 0.6 mg via INTRAVENOUS

## 2013-10-08 MED ORDER — LACTATED RINGERS IR SOLN
Status: DC | PRN
  Administered 2013-10-08: 1000 mL

## 2013-10-08 MED ORDER — ONDANSETRON HCL 4 MG PO TABS: 4.0000 mg | ORAL_TABLET | Freq: Four times a day (QID) | ORAL | Status: DC | PRN

## 2013-10-08 MED ORDER — PROMETHAZINE HCL 25 MG/ML IJ SOLN
6.2500 mg | INTRAMUSCULAR | Status: DC | PRN
Start: 2013-10-08 — End: 2013-10-08

## 2013-10-08 MED ORDER — KETOROLAC TROMETHAMINE 30 MG/ML IJ SOLN: 15.0000 mg | Freq: Once | INTRAMUSCULAR | Status: DC | PRN

## 2013-10-08 MED ORDER — 0.9 % SODIUM CHLORIDE (POUR BTL) OPTIME
TOPICAL | Status: DC | PRN
  Administered 2013-10-08: 1000 mL

## 2013-10-08 MED ORDER — DEXAMETHASONE SODIUM PHOSPHATE 10 MG/ML IJ SOLN
INTRAMUSCULAR | Status: DC | PRN
  Administered 2013-10-08: 10 mg via INTRAVENOUS

## 2013-10-08 MED ORDER — CEFAZOLIN SODIUM-DEXTROSE 2-3 GM-% IV SOLR
INTRAVENOUS | Status: AC
  Filled 2013-10-08: qty 50

## 2013-10-08 MED ORDER — SUCCINYLCHOLINE CHLORIDE 20 MG/ML IJ SOLN
INTRAMUSCULAR | Status: DC | PRN
  Administered 2013-10-08: 100 mg via INTRAVENOUS

## 2013-10-08 MED ORDER — BUPIVACAINE HCL (PF) 0.5 % IJ SOLN
INTRAMUSCULAR | Status: AC
  Filled 2013-10-08: qty 30

## 2013-10-08 MED ORDER — PHENYLEPHRINE HCL 10 MG/ML IJ SOLN
INTRAMUSCULAR | Status: DC | PRN
  Administered 2013-10-08: 80 ug via INTRAVENOUS

## 2013-10-08 MED ORDER — ROCURONIUM BROMIDE 100 MG/10ML IV SOLN
INTRAVENOUS | Status: AC
  Filled 2013-10-08: qty 1

## 2013-10-08 MED ORDER — FENTANYL CITRATE 0.05 MG/ML IJ SOLN
INTRAMUSCULAR | Status: DC | PRN
  Administered 2013-10-08: 100 ug via INTRAVENOUS
  Administered 2013-10-08: 50 ug via INTRAVENOUS
  Administered 2013-10-08: 100 ug via INTRAVENOUS

## 2013-10-08 MED ORDER — ONDANSETRON HCL 4 MG/2ML IJ SOLN
INTRAMUSCULAR | Status: DC | PRN
  Administered 2013-10-08: 4 mg via INTRAVENOUS

## 2013-10-08 MED ORDER — MIDAZOLAM HCL 5 MG/5ML IJ SOLN
INTRAMUSCULAR | Status: DC | PRN
  Administered 2013-10-08: 2 mg via INTRAVENOUS

## 2013-10-08 MED ORDER — PROPOFOL 10 MG/ML IV BOLUS
INTRAVENOUS | Status: AC
  Filled 2013-10-08: qty 20

## 2013-10-08 MED ORDER — ONDANSETRON HCL 4 MG/2ML IJ SOLN
4.0000 mg | INTRAMUSCULAR | Status: DC | PRN
  Administered 2013-10-09 – 2013-10-11 (×3): 4 mg via INTRAVENOUS
  Filled 2013-10-08 (×4): qty 2

## 2013-10-08 MED ORDER — PHENYLEPHRINE 40 MCG/ML (10ML) SYRINGE FOR IV PUSH (FOR BLOOD PRESSURE SUPPORT)
PREFILLED_SYRINGE | INTRAVENOUS | Status: AC
  Filled 2013-10-08: qty 10

## 2013-10-08 MED ORDER — MIDAZOLAM HCL 2 MG/2ML IJ SOLN
INTRAMUSCULAR | Status: AC
  Filled 2013-10-08: qty 2

## 2013-10-08 MED ORDER — CHLORHEXIDINE GLUCONATE 0.12 % MT SOLN
15.0000 mL | Freq: Two times a day (BID) | OROMUCOSAL | Status: DC
  Administered 2013-10-09 – 2013-10-13 (×7): 15 mL via OROMUCOSAL
  Filled 2013-10-08 (×12): qty 15

## 2013-10-08 SURGICAL SUPPLY — 54 items
APL SKNCLS STERI-STRIP NONHPOA (GAUZE/BANDAGES/DRESSINGS) ×1
APPLIER CLIP 5 13 M/L LIGAMAX5 (MISCELLANEOUS)
APR CLP MED LRG 5 ANG JAW (MISCELLANEOUS)
BENZOIN TINCTURE PRP APPL 2/3 (GAUZE/BANDAGES/DRESSINGS) ×3 IMPLANT
BLADE HEX COATED 2.75 (ELECTRODE) ×1 IMPLANT
CANISTER SUCTION 2500CC (MISCELLANEOUS) ×3 IMPLANT
CHLORAPREP W/TINT 26ML (MISCELLANEOUS) ×2 IMPLANT
CLIP APPLIE 5 13 M/L LIGAMAX5 (MISCELLANEOUS) IMPLANT
DECANTER SPIKE VIAL GLASS SM (MISCELLANEOUS) ×1 IMPLANT
DEVICE SUT QUICK LOAD TK 5 (STAPLE) ×12 IMPLANT
DEVICE SUT TI-KNOT TK 5X26 (MISCELLANEOUS) ×2 IMPLANT
DEVICE SUTURE ENDOST 10MM (ENDOMECHANICALS) ×3 IMPLANT
DEVICE TI KNOT TK5 (MISCELLANEOUS) ×1
DISSECTOR BLUNT TIP ENDO 5MM (MISCELLANEOUS) ×3 IMPLANT
DRAIN PENROSE 18X1/2 LTX STRL (DRAIN) ×1 IMPLANT
DRAIN PENROSE LF 8X20.3CM SIL (WOUND CARE) ×2 IMPLANT
DRAPE LAPAROSCOPIC ABDOMINAL (DRAPES) ×3 IMPLANT
DRAPE UTILITY XL STRL (DRAPES) ×5 IMPLANT
DRSG TEGADERM 2-3/8X2-3/4 SM (GAUZE/BANDAGES/DRESSINGS) ×18 IMPLANT
ELECT REM PT RETURN 9FT ADLT (ELECTROSURGICAL) ×3
ELECTRODE REM PT RTRN 9FT ADLT (ELECTROSURGICAL) ×1 IMPLANT
ENDOSTITCH 0 SINGLE 48 (SUTURE) ×19 IMPLANT
FELT TEFLON 4 X1 (Mesh General) ×3 IMPLANT
FILTER SMOKE EVAC LAPAROSHD (FILTER) ×3 IMPLANT
GLOVE BIOGEL PI IND STRL 7.0 (GLOVE) ×1 IMPLANT
GLOVE BIOGEL PI INDICATOR 7.0 (GLOVE) ×8
GLOVE ECLIPSE 8.0 STRL XLNG CF (GLOVE) ×1 IMPLANT
GLOVE INDICATOR 8.0 STRL GRN (GLOVE) ×14 IMPLANT
GOWN STRL REUS W/TWL LRG LVL3 (GOWN DISPOSABLE) ×3 IMPLANT
GOWN STRL REUS W/TWL XL LVL3 (GOWN DISPOSABLE) ×10 IMPLANT
KIT BASIN OR (CUSTOM PROCEDURE TRAY) ×3 IMPLANT
MANIFOLD NEPTUNE II (INSTRUMENTS) ×2 IMPLANT
NS IRRIG 1000ML POUR BTL (IV SOLUTION) ×3 IMPLANT
PENCIL BUTTON HOLSTER BLD 10FT (ELECTRODE) IMPLANT
QUICK LOAD TK 5 (STAPLE) ×9
SCALPEL HARMONIC ACE (MISCELLANEOUS) ×3 IMPLANT
SCISSORS LAP 5X35 DISP (ENDOMECHANICALS) ×3 IMPLANT
SET IRRIG TUBING LAPAROSCOPIC (IRRIGATION / IRRIGATOR) ×3 IMPLANT
SLEEVE XCEL OPT CAN 5 100 (ENDOMECHANICALS) ×6 IMPLANT
SOLUTION ANTI FOG 6CC (MISCELLANEOUS) ×3 IMPLANT
SPONGE LAP 18X18 X RAY DECT (DISPOSABLE) IMPLANT
SUT MNCRL AB 4-0 PS2 18 (SUTURE) ×3 IMPLANT
TIP INNERVISION DETACH 40FR (MISCELLANEOUS) IMPLANT
TIP INNERVISION DETACH 50FR (MISCELLANEOUS) IMPLANT
TIP INNERVISION DETACH 56FR (MISCELLANEOUS) IMPLANT
TIPS INNERVISION DETACH 40FR (MISCELLANEOUS)
TOWEL OR 17X26 10 PK STRL BLUE (TOWEL DISPOSABLE) ×3 IMPLANT
TRAY FOLEY BAG SILVER LF 16FR (CATHETERS) ×2 IMPLANT
TRAY FOLEY CATH 14FRSI W/METER (CATHETERS) ×1 IMPLANT
TRAY LAP CHOLE (CUSTOM PROCEDURE TRAY) ×3 IMPLANT
TROCAR BLADELESS OPT 5 100 (ENDOMECHANICALS) ×3 IMPLANT
TROCAR XCEL BLUNT TIP 100MML (ENDOMECHANICALS) ×3 IMPLANT
TROCAR XCEL NON-BLD 11X100MML (ENDOMECHANICALS) ×5 IMPLANT
TUBING FILTER THERMOFLATOR (ELECTROSURGICAL) ×3 IMPLANT

## 2013-10-08 NOTE — Anesthesia Postprocedure Evaluation (Signed)
  Anesthesia Post-op Note  Patient: Danielle [name]  Procedure(s) Performed: Procedure(s) (LRB): LAPAROSCOPIC HELLER MYOTOMY, DOR FUNDIPLICATION, UPPER ENDOSCOPY (N/A) UPPER GI ENDOSCOPY (N/A)  Patient Location: PACU  Anesthesia Type: General  Level of Consciousness: awake and alert   Airway and Oxygen Therapy: Patient Spontanous Breathing  Post-op Pain: mild  Post-op Assessment: Post-op Vital signs reviewed, Patient's Cardiovascular Status Stable, Respiratory Function Stable, Patent Airway and No signs of Nausea or vomiting  Last Vitals:  Filed Vitals:   10/08/13 1300  BP: 128/68  Pulse: 101  Temp:   Resp: 14    Post-op Vital Signs: stable   Complications: No apparent anesthesia complications

## 2013-10-08 NOTE — Interval H&P Note (Signed)
History and Physical Interval Note:  10/08/2013 8:52 AM  [name]  has presented today for surgery, with the diagnosis of achalasia  The various methods of treatment have been discussed with the patient and family. After consideration of risks, benefits and other options for treatment, the patient has consented to  Procedure(s): LAPAROSCOPIC HELLER MYOTOMY (N/A) UPPER GI ENDOSCOPY (N/A) as a surgical intervention .  The patient's history has been reviewed, patient examined, no change in status, stable for surgery.  I have reviewed the patient's chart and labs.  Questions were answered to the patient's satisfaction.     Rayen Dafoe Lenna Sciara

## 2013-10-08 NOTE — Op Note (Signed)
Operative Note  Danielle [name] female 43 y.o. 10/08/2013  PREOPERATIVE DX:  Achalasia  POSTOPERATIVE DX:  Same  PROCEDURE:  Laparoscopic Heller myotomy and Dor fundoplication         Surgeon: Odis Hollingshead   Assistants: Alphonsa Overall M.D.  Anesthesia: General endotracheal anesthesia  Indications: This is a 43 year old female who has early achalasia. Her symptoms are progressing. After being offered all options of treatment, she elected to proceed with Heller myotomy and presents for that.    Procedure Detail:  She was seen in the holding area. She was brought to the operating room placed supine on the operating table and a general anesthetic was given. A Foley catheter was inserted. The abdominal wall was widely sterilely prepped and draped.  She was placed in slight reverse Trendelenburg position. A 5 mm incision was made in the left upper quadrant subcostal region. Using a 5 mm Optivue trocar and laparoscope access was gained into the peritoneal cavity and a pneumoperitoneum was created.  Inspection of the area underneath the trocar demonstrated no evidence of organ injury or bleeding. Some adhesions were noted between the omentum and lower midline where she had had previous surgery. An 11 mm trocar was placed through an incision just to the left of the umbilicus. A 5 mm trocar was placed in the right upper quadrant. An 11 mm trocar was placed in the epigastric area. A 5 mm incision was made in the subxiphoid area. A self-retaining liver retractor was then placed into the abdominal cavity. The left lobe of the liver was retracted anteriorly. A 5 mm trocar was inserted in the left upper quadrant laterally.  Using the harmonic scalpel, the thin gastrohepatic ligament was divided to the level of the right crus. I then divided the phrenoesophageal ligament exposing the anterior surface of the esophagus. Using careful blunt dissection I dissected the esophagus partly away from the right  and left crura. Following this, a fat pad at the gastroesophageal junction was grasped and retracted inferiorly. Using selective electrocautery and blunt dissection I began dividing the longitudinal fibers beginning at the gastroesophageal junction area and extending 6 cm superiorly. I then identified the circular fibers bluntly divided these as well 6 cm superiorly from the gastroesophageal junction. The underlying mucosa was intact. I distracted the muscle fibers split so that a 180 myotomy was appreciated. I continued the myotomy at the level of the gastroesophageal junction and approximately 2-3 cm onto the stomach. The muscle fibers were divided with blunt dissection and electrocautery. Bleeding from some small vessels was controlled with the harmonic scalpel. There was a mucosal hematoma that formed but no evidence of perforation.  Following the myotomy, Dr. Lucia Gaskins performed upper endoscopy.  This did not demonstrate any evidence of mucosal injury or leak. The gastroesophageal junction was wide open and the endoscope passed easily into the stomach.The endoscope was removed.  Following this, a Dor fundoplication was performed.  The fundus was mobilized by dividing the short gastric vessels. The anterior fundoplication was then performed using interrupted nonabsorbable size 0 sutures incorporating the stomach, left crus, and also muscle of the myotomy on the left and right sides. I then placed a suture between the apex of the hiatus and the stomach. This provided for an adequate anterior fundoplication with no tension and no compromise of the myotomy.  Four quadrant inspection was performed and there is no evidence of organ injury or bleeding. The liver retractor was removed. The trocars were removed and the CO2 was  released.  The skin incisions were closed with 4-0 Monocryl subcuticular stitches. Steri-Strips and sterile dressings were applied.  She tolerated the procedure room without any apparent  complications and was taken to the recovery room in satisfactory condition.  Estimated Blood Loss:  < 200 ml         Drains: none  Blood Given: none          Specimens: none        Complications:  * No complications entered in OR log *         Disposition: PACU - hemodynamically stable.         Condition: stable

## 2013-10-08 NOTE — Anesthesia Preprocedure Evaluation (Addendum)
Anesthesia Evaluation  Patient identified by MRN, date of birth, ID band Patient awake  General Assessment Comment:HIV  Reviewed: Allergy & Precautions, H&P , NPO status , Patient's Chart, lab work & pertinent test results  Airway Mallampati: II TM Distance: >3 FB Neck ROM: Full    Dental no notable dental hx.    Pulmonary neg pulmonary ROS,  breath sounds clear to auscultation  Pulmonary exam normal       Cardiovascular negative cardio ROS  Rhythm:Regular Rate:Normal     Neuro/Psych negative neurological ROS  negative psych ROS   GI/Hepatic negative GI ROS, Neg liver ROS, GERD-  Medicated,  Endo/Other  negative endocrine ROS  Renal/GU negative Renal ROS  negative genitourinary   Musculoskeletal negative musculoskeletal ROS (+)   Abdominal   Peds negative pediatric ROS (+)  Hematology  (+) anemia , HIV,   Anesthesia Other Findings   Reproductive/Obstetrics negative OB ROS                          Anesthesia Physical Anesthesia Plan  ASA: III  Anesthesia Plan: General   Post-op Pain Management:    Induction: Intravenous, Rapid sequence and Cricoid pressure planned  Airway Management Planned: Oral ETT  Additional Equipment:   Intra-op Plan:   Post-operative Plan: Extubation in OR  Informed Consent: I have reviewed the patients History and Physical, chart, labs and discussed the procedure including the risks, benefits and alternatives for the proposed anesthesia with the patient or authorized representative who has indicated his/her understanding and acceptance.   Dental advisory given  Plan Discussed with: CRNA and Surgeon  Anesthesia Plan Comments:         Anesthesia Quick Evaluation

## 2013-10-08 NOTE — Transfer of Care (Signed)
Immediate Anesthesia Transfer of Care Note  Patient: Danielle [name]  Procedure(s) Performed: Procedure(s): LAPAROSCOPIC HELLER MYOTOMY, DOR FUNDIPLICATION, UPPER ENDOSCOPY (N/A) UPPER GI ENDOSCOPY (N/A)  Patient Location: PACU  Anesthesia Type:General  Level of Consciousness: sedated  Airway & Oxygen Therapy: Patient Spontanous Breathing and Patient connected to face mask oxygen  Post-op Assessment: Report given to PACU RN and Post -op Vital signs reviewed and stable  Post vital signs: Reviewed and stable  Complications: No apparent anesthesia complications

## 2013-10-08 NOTE — H&P (Signed)
Danielle [name] is an 43 y.o. female.   Chief Complaint:  Here for elective surgery for achalasia. HPI: She developed dysphagia to solids and liquids. She was evaluated and find a half endoscopic as well as manometry findings consistent with early achalasia. After being given treatment options, she elected to proceed with laparoscopic Heller myotomy.  Past Medical History  Diagnosis Date  . HIV infection   . Anemia   . Asthma   . GERD (gastroesophageal reflux disease)   . Chronic back pain   . Depression   . Bulging lumbar disc   . Anxiety   . Migraines   . Pneumonia     Past Surgical History  Procedure Laterality Date  . Hernia repair    . Other surgical history      biopsy axillary ,right arm  . Cesarean section      twice  . Right arm biopsy    . Esophageal manometry N/A 06/29/2013    Procedure: ESOPHAGEAL MANOMETRY (EM);  Surgeon: Jerene Bears, MD;  Location: [facility];  Service: Gastroenterology;  Laterality: N/A;    Family History  Problem Relation Age of Onset  . Hyperlipidemia Mother   . Diabetes Mother   . Stroke Mother   . Diabetes Brother   . Colon cancer Father   . Esophageal cancer Neg Hx   . Rectal cancer Neg Hx   . Stomach cancer Neg Hx    Social History:  reports that she has never smoked. She has never used smokeless tobacco. She reports that she does not drink alcohol or use illicit drugs.  Allergies:  Allergies  Allergen Reactions  . Latex Rash    Medications Prior to Admission  Medication Sig Dispense Refill  . dolutegravir (TIVICAY) 50 MG tablet Take 50 mg by mouth every evening.       . DULoxetine (CYMBALTA) 30 MG capsule Take 30 mg by mouth every morning.       Marland Kitchen emtricitabine-tenofovir (TRUVADA) 200-300 MG per tablet Take 1 tablet by mouth every evening.       Marland Kitchen HYDROcodone-acetaminophen (NORCO) 10-325 MG per tablet Take 1 tablet by mouth every 6 (six) hours as needed for moderate pain.      Marland Kitchen oxybutynin (DITROPAN-XL) 10 MG 24 hr  tablet Take 10 mg by mouth daily.       . pantoprazole (PROTONIX) 20 MG tablet Take 20 mg by mouth daily.      . ranitidine (ZANTAC) 150 MG tablet Take 150 mg by mouth every evening.       Marland Kitchen ibuprofen (ADVIL,MOTRIN) 200 MG tablet Take 200 mg by mouth every 6 (six) hours as needed for fever, headache or mild pain.      . meloxicam (MOBIC) 15 MG tablet Take 15 mg by mouth daily.      . traZODone (DESYREL) 50 MG tablet Take 50 mg by mouth at bedtime as needed for sleep. As directed        No results found for this or any previous visit (from the past 48 hour(s)). No results found.  Review of Systems  Constitutional: Negative.   Respiratory: Negative.   Cardiovascular: Positive for chest pain.  Gastrointestinal: Positive for abdominal pain.    Blood pressure 126/80, pulse 103, temperature 98.2 F (36.8 C), temperature source Oral, resp. rate 20, last menstrual period 10/08/2013, SpO2 100.00%. Physical Exam  Constitutional: No distress.  Overweight female.  HENT:  Head: Normocephalic and atraumatic.  Cardiovascular: Normal rate and regular rhythm.  Respiratory: Effort normal and breath sounds normal.  GI:  Midline scar from supraumbilical area to pubis.  Musculoskeletal: She exhibits no tenderness.  Lymphadenopathy:    She has no cervical adenopathy.  Neurological: She is alert.  Skin: Skin is warm and dry.     Assessment/Plan Achalasia  Plan:  Laparoscopic Heller myotomy and Dor fundoplication with intraop endoscopy.  Jamey Demchak J 10/08/2013, 8:40 AM

## 2013-10-08 NOTE — Discharge Instructions (Addendum)
[facility]   EATING AFTER YOUR ESOPHAGEAL SURGERY  After your esophageal surgery, you can expect some difficulty swallowing.  If food sticks when you eat, it is called "dysphagia".  This is due to swelling around your surgery site and will most likely resolve within a few weeks.  To help you through this temporary phase, we start you out on a Level 1 diet.  Your first meal in the hospital was clear liquids.    We ask patients to stay on a Level 1 diet until you see your doctor in the office to avoid anything getting "stuck" near your recent surgery.  Don't be alarmed if your ability to swallow doesn't progress according to this plan.  Everyone is different and some take longer or shorter.  Use common sense.  If you are having trouble swallowing a particular food, then avoid it.  If food is sticking when you advance your diet, go back to the previous day or two.  In general some simple rules to follow are:  Maintain an upright position (as near 90 degrees as possible) whenever eating or drinking.  Take small bites - only 1/2 to 1 teaspoon at a time.  Eat slowly.  It may also help to eat only one food at a time.  Avoid talking while eating.  Do not mix solid foods and liquids in the same mouthful and do not "wash foods down" with liquids, unless you have been instructed to do so by your surgeon.  Eat in a relaxed atmosphere, with no distractions.  Following each meal, sit in an upright position (90 degree angle) for 60 to 90 minutes.  Avoid carbonated (bubbly) drinks.  Do not use straws.  If food does stick, don't panic.  Try to relax and let the food pass on its own.  Sipping strong hot black tea can also help.  If you have any questions please call our office at [phone].   LEVEL 1 PUREED FOODS:  1ST 2 WEEKS AFTER SURGERY Foods in this group are pureed or blenderized to a smooth, mashed potato-like consistency.  If necessary, the pureed foods can keep their  shape with the addition of a thickening agent.  Meat should be pureed to a smooth pasty consistency.  Hot broth or gravy may be added to the pureed meat, approximately 1 oz. of liquid per 3 oz. serving of meat. CAUTION:  If any foods do not puree into a smooth consistency, it may make eating for swallowing more difficult.  For example, zucchini seeds sometimes do not blend well. Hot Foods Cold Foods  Pureed scrambled eggs and cheese Pureed cottage cheese  Baby cereals Thickened juices and nectars  Thinned cooked cereals (no lumps) Thickened milk or eggnog  Pureed [place] toast or pancakes Ensure  Mashed potatoes Ice cream  Pureed parsley, au gratin, scalloped potatoes, candied sweet potatoes Fruit or [place] ice, sherbet  Pureed buttered or alfredo noodles Plain yogurt  Pureed vegetables (no corn or peas) Instant breakfast  Pureed soups and creamed soups Smooth pudding, mousse, custard  Pureed scalloped apples Whipped gelatin  Gravies Sugar, syrup, honey, jelly  Sauces, cheese, tomato, barbecue, white, creamed Cream  Any baby food Creamer  Alcohol in moderation (not beer or champagne) Margarine  Coffee or tea Mayonnaise   Ketchup, mustard   Apple sauce   SAMPLE MENU:  PUREED DIET Breakfast Lunch Dinner   Orange juice, 1/2 cup  Cream of wheat, 1/2 cup  Pineapple juice, 1/2 cup  Pureed [place], barley soup, 3/4 cup  Pureed Hawaiian chicken, 3 oz   Scrambled eggs, mashed or blended with cheese, 1/2 cup  Tea or coffee, 1 cup   Whole milk, 1 cup   Non-dairy creamer, 2 Tbsp.  Mashed potatoes, 1/2 cup  Pureed cooled broccoli, 1/2 cup  Apple sauce, 1/2 cup  Coffee or tea  Mashed potatoes, 1/2 cup  Pureed spinach, 1/2 cup  Frozen yogurt, 1/2 cup  Tea or coffee    LEVEL 2 After your first 2 weeks, you can advance to a soft diet.  Keep on this diet until everything goes down easily. Hot Foods Cold Foods  White fish Cottage cheese  Stuffed fish Junior baby fruit  Baby  food meals Semi thickened juices  Minced soft cooked, scrambled, poached eggs nectars  Souffle & omelets Ripe mashed bananas  Cooked cereals Canned fruit, pineapple sauce, milk  potatoes Milkshake  Buttered or Alfredo noodles Custard  Cooked cooled vegetable Puddings, including tapioca  Sherbet Yogurt  Vegetable soup or alphabet soup Fruit ice, [place] ice  Gravies Whipped gelatin  Sugar, syrup, honey, jelly Junior baby desserts  Sauces:  Cheese, creamed, barbecue, tomato, white Cream  Coffee or tea Margarine   SAMPLE MENU:  LEVEL 2 Breakfast Lunch Dinner   Orange juice, 1/2 cup  Oatmeal, 1/2 cup  Scrambled eggs with cheese, 1/2 cup  Decaffeinated tea, 1 cup  Whole milk, 1 cup  Non-dairy creamer, 2 Tbsp  Pineapple juice, 1/2 cup  Minced beef, 3 oz  Gravy, 2 Tbsp  Mashed potatoes, 1/2 cup  Minced fresh broccoli, 1/2 cup  Applesauce, 1/2 cup  Coffee, 1 cup  [place], barley soup, 3/4 cup  Minced Hawaiian chicken, 3 oz  Mashed potatoes, 1/2 cup  Cooked spinach, 1/2 cup  Frozen yogurt, 1/2 cup  Non-dairy creamer, 2 Tbsp    LEVEL 3 After all the foods in level 2 (soft diet) are passing through well you should advance up to the next level.  It is still important to cut these foods into small pieces and eat slowly. Hot Foods Cold Foods  Poultry Cottage cheese  Chopped Swedish meatballs Yogurt  Meat salads (ground or flaked meat) Milk  Flaked fish (tuna) Milkshakes  Poached or scrambled eggs Soft, cold, dry cereal  Souffles and omelets Fruit juices or nectars  Cooked cereals Chopped canned fruit  Chopped [place] toast or pancakes Canned fruit cocktail  Noodles or pasta (no rice) Pudding, mousse, custard  Cooked vegetables (no frozen peas, corn, or mixed vegetables) Green salad  Canned small sweet peas Ice cream  Creamed soup or vegetable soup Fruit ice, [place] ice  Pureed vegetable soup or alphabet soup Non-dairy creamer  Ground scalloped apples Margarine   Gravies Mayonnaise  Sauces:  Cheese, creamed, barbecue, tomato, white Ketchup  Coffee or tea Mustard   SAMPLE MENU:  LEVEL 3 Breakfast Lunch Dinner   Orange juice, 1/2 cup  Oatmeal, 1/2 cup  Scrambled eggs with cheese, 1/2 cup  Decaffeinated tea, 1 cup  Whole milk, 1 cup  Non-dairy creamer, 2 Tbsp  Ketchup, 1 Tbsp  Margarine, 1 tsp  Salt, 1/4 tsp  Sugar, 2 tsp  Pineapple juice, 1/2 cup  Ground beef, 3 oz  Gravy, 2 Tbsp  Mashed potatoes, 1/2 cup  Cooked spinach, 1/2 cup  Applesauce, 1/2 cup  Decaffeinated coffee  Whole milk  Non-dairy creamer, 2 Tbsp  Margarine, 1 tsp  Salt, 1/4 tsp  Pureed [place], barley soup, 3/4 cup  Barbecue chicken, 3  oz  Mashed potatoes, 1/2 cup  Ground fresh broccoli, 1/2 cup  Frozen yogurt, 1/2 cup  Decaffeinated tea, 1 cup  Non-dairy creamer, 2 Tbsp  Margarine, 1 tsp  Salt, 1/4 tsp  Sugar, 1 tsp    LEVEL 4:  REGULAR FOODS Foods in this group are soft, moist, regularly textured foods.  This level includes red meat and breads, which tend to be the hardest things to swallow.  Eat very slow, chew well and continue to avoid carbonated drinks. Hot Foods Cold Foods  Baked fish or skinned Soft cheeses - cottage cheese  Souffles and omelets Cream cheese  Eggs Yogurt  Stuffed shells Milk  Spaghetti with meat sauce Milkshakes  Cooked cereal Cold dry cereals (no nuts, dried fruit, coconut)  [place] toast or pancakes Crackers  Buttered toast Fruit juices or nectars  Noodles or pasta (no rice) Canned fruit  Potatoes (all types) Ripe bananas  Soft, cooked vegetables (no corn, lima, or baked beans) Peeled, ripe, fresh fruit  Creamed soups or vegetable soup Cakes (no nuts, dried fruit, coconut)  Canned chicken noodle soup Plain doughnuts  Gravies Ice cream  Bacon dressing Pudding, mousse, custard  Sauces:  Cheese, creamed, barbecue, tomato, white Fruit ice, [place] ice, sherbet  Decaffeinated tea or coffee Whipped gelatin   Pork chops Regular gelatin   Canned fruited gelatin molds   Sugar, syrup, honey, jam, jelly   Cream   Non-dairy   Margarine   Oil   Mayonnaise   Ketchup   Mustard    Activity after esophageal surgery:  No lifting over 10 pounds until released to do so by MD.  May shower.  Remove bandages on 10/11/13.  Leave steri strips on.  May drive when you are pain-free.  Call for high fever (>101.5), inability to swallow, wound problems, persistent vomiting.

## 2013-10-09 ENCOUNTER — Encounter (HOSPITAL_COMMUNITY): Payer: Self-pay | Admitting: General Surgery

## 2013-10-09 ENCOUNTER — Inpatient Hospital Stay (HOSPITAL_COMMUNITY): Payer: Medicare Other

## 2013-10-09 DIAGNOSIS — D649 Anemia, unspecified: Secondary | ICD-10-CM | POA: Diagnosis present

## 2013-10-09 DIAGNOSIS — R141 Gas pain: Secondary | ICD-10-CM | POA: Diagnosis not present

## 2013-10-09 MED ORDER — IOHEXOL 300 MG/ML  SOLN
50.0000 mL | Freq: Once | INTRAMUSCULAR | Status: AC | PRN
  Administered 2013-10-09: 30 mL via ORAL

## 2013-10-09 MED ORDER — SIMETHICONE 80 MG PO CHEW
80.0000 mg | CHEWABLE_TABLET | Freq: Four times a day (QID) | ORAL | Status: DC
  Administered 2013-10-09 – 2013-10-13 (×17): 80 mg via ORAL
  Filled 2013-10-09 (×20): qty 1

## 2013-10-09 MED ORDER — ACETAMINOPHEN 10 MG/ML IV SOLN
1000.0000 mg | Freq: Four times a day (QID) | INTRAVENOUS | Status: AC
  Administered 2013-10-09 – 2013-10-10 (×4): 1000 mg via INTRAVENOUS
  Filled 2013-10-09 (×5): qty 100

## 2013-10-09 MED ORDER — BISACODYL 10 MG RE SUPP
10.0000 mg | RECTAL | Status: AC
  Administered 2013-10-09 (×2): 10 mg via RECTAL
  Filled 2013-10-09 (×2): qty 1

## 2013-10-09 NOTE — Progress Notes (Signed)
1 Day Post-Op  Subjective: Her abdomen is tight.  She has pain at the incision sites.  Belching some.  Feels like she needs to pass gas but nothing comes out.  Objective: Vital signs in last 24 hours: Temp:  [98.2 F (36.8 C)-99.3 F (37.4 C)] 99 F (37.2 C) (01/30 0603) Pulse Rate:  [86-101] 98 (01/30 0603) Resp:  [11-17] 16 (01/30 0603) BP: (113-134)/(55-82) 129/77 mmHg (01/30 0603) SpO2:  [93 %-100 %] 96 % (01/30 0603) Weight:  [15 lb 14 oz (7.2 kg)] 15 lb 14 oz (7.2 kg) (01/29 1602) Last BM Date: 10/07/13  Intake/Output from previous day: 01/29 0701 - 01/30 0700 In: 3133.3 [I.V.:3133.3] Out: 1610 [Urine:1410; Blood:200] Intake/Output this shift:    PE: General- In NAD Abdomen-firm and distended, no bowel sounds heard, tender at incisions sites but not elsewhere  Lab Results:  No results found for this basename: WBC, HGB, HCT, PLT,  in the last 72 hours BMET No results found for this basename: NA, K, CL, CO2, GLUCOSE, BUN, CREATININE, CALCIUM,  in the last 72 hours PT/INR No results found for this basename: LABPROT, INR,  in the last 72 hours Comprehensive Metabolic Panel:    Component Value Date/Time   NA 141 10/02/2013 1010   NA 139 05/25/2013 1232   K 3.9 10/02/2013 1010   K 3.9 05/25/2013 1232   CL 105 10/02/2013 1010   CL 108 05/25/2013 1232   CO2 24 10/02/2013 1010   CO2 26 05/25/2013 1232   BUN 17 10/02/2013 1010   BUN 13 05/25/2013 1232   CREATININE 1.11* 10/02/2013 1010   CREATININE 1.26* 05/25/2013 1232   CREATININE 1.16* 05/19/2013 1728   CREATININE 0.93 10/20/2012 1101   GLUCOSE 97 10/02/2013 1010   GLUCOSE 99 05/25/2013 1232   CALCIUM 9.0 10/02/2013 1010   CALCIUM 9.4 05/25/2013 1232   AST 20 10/02/2013 1010   AST 21 05/25/2013 1232   ALT 21 10/02/2013 1010   ALT 20 05/25/2013 1232   ALKPHOS 88 10/02/2013 1010   ALKPHOS 73 05/25/2013 1232   BILITOT 0.2* 10/02/2013 1010   BILITOT 0.3 05/25/2013 1232   PROT 7.9 10/02/2013 1010   PROT 7.2 05/25/2013 1232   ALBUMIN 3.8  10/02/2013 1010   ALBUMIN 4.1 05/25/2013 1232     Studies/Results: UGI 10/09/13 demonstrates passage of contrast into the stomach with no leak; the stomach, small bowel and colon are dilated with gas; contrast drains into the small bowel  Anti-infectives: Anti-infectives   Start     Dose/Rate Route Frequency Ordered Stop   10/08/13 1600  ceFAZolin (ANCEF) IVPB 1 g/50 mL premix     1 g 100 mL/hr over 30 Minutes Intravenous Every 6 hours 10/08/13 1536 10/09/13 0719   10/08/13 0715  ceFAZolin (ANCEF) IVPB 2 g/50 mL premix     2 g 100 mL/hr over 30 Minutes Intravenous On call to O.R. 10/08/13 0650 10/08/13 0920      Assessment Principal Problem:   Achalasia s/p laparoscopic Heller myotomy and Dor fundoplication-no leak on UGI; gas filled GI tract-likely partly from upper endoscopy and a fair amount of air insufflation during that part of the procedure and well as some ileus. Active Problems:   HIV DISEASE   ASTHMA   Anemia-chronic    LOS: 1 day   Plan: Remove foley.  Get OOB in chair and ambulate.  Simethecone tabs.  Dulcolax suppositories.  Hold on diet for now.  We did discuss specific discharge instructions.   Meziah Blasingame  J 10/09/2013

## 2013-10-09 NOTE — Progress Notes (Signed)
Paged MD Rosenbower regarding patient with nausea, zofran given earlier at 1330 and it's every 6 hours as needed, awaiting callback Neta Mends RN 10-09-2012 17:53pm

## 2013-10-09 NOTE — Care Management Note (Signed)
    Page 1 of 1   10/09/2013     11:03:35 AM   CARE MANAGEMENT NOTE 10/09/2013  Patient:  Danielle [name], Danielle [name]   Account Number:  [id number]  Date Initiated:  10/09/2013  Documentation initiated by:  Sunday Spillers  Subjective/Objective Assessment:   43 yo female admitted s/p Heller Myotomy. PTA lived at home with spouse.     Action/Plan:   Home when stable   Anticipated DC Date:  10/12/2013   Anticipated DC Plan:  [place]  CM consult      Choice offered to / List presented to:             Status of service:  Completed, signed off Medicare Important Message given?   (If response is "NO", the following Medicare IM given date fields will be blank) Date Medicare IM given:   Date Additional Medicare IM given:    Discharge Disposition:  HOME/SELF CARE  Per UR Regulation:  Reviewed for med. necessity/level of care/duration of stay  If discussed at [place] of Stay Meetings, dates discussed:    Comments:

## 2013-10-09 NOTE — Op Note (Signed)
NAMEMarland Kitchen  Danielle [name], Danielle [name]              ACCOUNT NO.:  [id number]  MEDICAL RECORD NO.:  [id number]  LOCATION:  5                         FACILITY:  [facility]  PHYSICIAN:  Fenton Malling. Lucia Gaskins, M.D.  DATE OF BIRTH:  [birth year]  DATE OF PROCEDURE:  10/08/2013                              OPERATIVE REPORT   PREOPERATIVE DIAGNOSIS:  Achalasia.  POSTOPERATIVE DIAGNOSES:  Achalasia and status post laparoscopic Heller myotomy.  PROCEDURE:  Upper endoscopy.  SURGEON:  Alphonsa Overall, M.D.  FIRST ASSISTANT:  None.  ANESTHESIA:  General endotracheal.  INDICATION FOR PROCEDURE:  [name] is a 43 year old African American female who has achalasia.  She is undergoing a Heller myotomy by Dr. Jackolyn Confer for treatment of this achalasia.  I am doing upper endoscopy to evaluate the adequacy of the myotomy and to make sure there is no transmural injury of the esophagus or stomach.  OPERATIVE NOTE:  We are in room #1, the patient is under general anesthesia.  Dr. Zella Richer is manning the laparoscopic and evaluating the esophagus and proximal stomach directly where I do the endoscopy.  The Pentax endoscope was passed without difficulty down to the gastroesophageal junction.  The esophagogastric junction was noted to be at 38 cm.  Dr. Zella Richer was able to note a myotomy extending 5-6 cm above the esophagogastric junction and about 2-3 cm down on the gastric side of the esophagogastric junction.  There was no evidence of air leak or perforation of the mucosa.  The esophagogastric junction was widely patent and the scope was then advanced to the halfway into the stomach. The scope was then withdrawn and the esophagus otherwise was unremarkable.    The patient tolerated my portion of the procedure well. Dr. Zella Richer dictate the [facility] myotomy.   Fenton Malling. Lucia Gaskins, M.D., FACS   DHN/MEDQ  D:  10/08/2013  T:  10/09/2013  Job:  [id number]  cc:   Odis Hollingshead, M.D. [address], [place] [address]

## 2013-10-10 ENCOUNTER — Inpatient Hospital Stay (HOSPITAL_COMMUNITY): Payer: Medicare Other

## 2013-10-10 ENCOUNTER — Encounter (HOSPITAL_COMMUNITY): Payer: Self-pay | Admitting: Radiology

## 2013-10-10 DIAGNOSIS — J9819 Other pulmonary collapse: Secondary | ICD-10-CM | POA: Diagnosis not present

## 2013-10-10 DIAGNOSIS — K228 Other specified diseases of esophagus: Secondary | ICD-10-CM | POA: Diagnosis not present

## 2013-10-10 DIAGNOSIS — D259 Leiomyoma of uterus, unspecified: Secondary | ICD-10-CM | POA: Diagnosis not present

## 2013-10-10 DIAGNOSIS — Z21 Asymptomatic human immunodeficiency virus [HIV] infection status: Secondary | ICD-10-CM | POA: Diagnosis not present

## 2013-10-10 LAB — CBC
HCT: 30.6 % — ABNORMAL LOW (ref 36.0–46.0)
HEMOGLOBIN: 8.7 g/dL — AB (ref 12.0–15.0)
MCH: 20.3 pg — ABNORMAL LOW (ref 26.0–34.0)
MCHC: 28.4 g/dL — ABNORMAL LOW (ref 30.0–36.0)
MCV: 71.5 fL — ABNORMAL LOW (ref 78.0–100.0)
Platelets: 255 10*3/uL (ref 150–400)
RBC: 4.28 MIL/uL (ref 3.87–5.11)
RDW: 17.7 % — ABNORMAL HIGH (ref 11.5–15.5)
WBC: 9.8 10*3/uL (ref 4.0–10.5)

## 2013-10-10 LAB — BASIC METABOLIC PANEL
BUN: 11 mg/dL (ref 6–23)
CHLORIDE: 99 meq/L (ref 96–112)
CO2: 28 meq/L (ref 19–32)
Calcium: 8.5 mg/dL (ref 8.4–10.5)
Creatinine, Ser: 1 mg/dL (ref 0.50–1.10)
GFR calc Af Amer: 81 mL/min — ABNORMAL LOW (ref >90)
GFR calc non Af Amer: 69 mL/min — ABNORMAL LOW (ref >90)
GLUCOSE: 143 mg/dL — AB (ref 70–99)
POTASSIUM: 3.8 meq/L (ref 3.7–5.3)
SODIUM: 136 meq/L — AB (ref 137–147)

## 2013-10-10 MED ORDER — SODIUM CHLORIDE 0.9 % IV BOLUS (SEPSIS)
1000.0000 mL | Freq: Once | INTRAVENOUS | Status: AC
  Administered 2013-10-10: 1000 mL via INTRAVENOUS

## 2013-10-10 MED ORDER — IOHEXOL 300 MG/ML  SOLN
50.0000 mL | Freq: Once | INTRAMUSCULAR | Status: AC | PRN
  Administered 2013-10-10: 50 mL via ORAL

## 2013-10-10 MED ORDER — IOHEXOL 300 MG/ML  SOLN
100.0000 mL | Freq: Once | INTRAMUSCULAR | Status: AC | PRN
  Administered 2013-10-10: 100 mL via INTRAVENOUS

## 2013-10-10 MED ORDER — METOPROLOL TARTRATE 1 MG/ML IV SOLN
5.0000 mg | Freq: Four times a day (QID) | INTRAVENOUS | Status: DC | PRN
  Administered 2013-10-10: 5 mg via INTRAVENOUS
  Filled 2013-10-10: qty 5

## 2013-10-10 MED ORDER — IOHEXOL 300 MG/ML  SOLN
150.0000 mL | Freq: Once | INTRAMUSCULAR | Status: AC | PRN
  Administered 2013-10-10: 50 mL via ORAL

## 2013-10-10 NOTE — Progress Notes (Signed)
Her CT scan shows a narrowing in the mid sigmoid colon region which appears to be causing a partial obstruction.  The colon and small bowel proximal to this are dilated.  The stomach is dilated as well. There is no leak of contrast or evidence of extraluminal contrast in the lower mediastinum, pleural spaces, or abdominal cavity. She does have some atelectasis.    She is feeling better, passing gas, belching, and reports she has a BM.  Her temperature is 99.7, BP 135/85, P 128, RR 18, SaO2 100% on room air.  Her abdomen is softer, a little less distended and not significantly tender at this time.  Given the persistent tachycardia and low grade fever, I feel we need to definitely rule out a delayed leak from the myotomy site despite the UGI from yesterday being negative.  Have discussed this with the radiologist and will get this done stat. If the second study is negative with ask GI to see her regarding the lesion on CT scan.

## 2013-10-10 NOTE — Progress Notes (Signed)
Pt's abd distended w/ tympanic bowel sounds auscultated.  Pt c/o nausea and abd pain.  Zofran iv given. Assisted pt up in hall. Pt walked approx 180 feet.  Pt assisted up in hall at 11pm and walked approx 360 feet.  Pt tolerated fair.  Pt stated that she's not passing gas as yet.  Pt up to rocking chair x 45 minutes.  abd remains firm and distended.

## 2013-10-10 NOTE — Progress Notes (Signed)
Dr. Hassell Done notified of pt's decreased UOP.  No new orders received.

## 2013-10-10 NOTE — Progress Notes (Signed)
Repeat swallow study is negative for a leak.  GI consult called- to see tomorrow for partial colon obstruction.  Will repeat lab and x-rays in AM.

## 2013-10-10 NOTE — Progress Notes (Signed)
Patient ID: Danielle [name], female   DOB: [birth year], 43 y.o.   MRN: [id number] [facility] Surgery Progress Note:   2 Days Post-Op  Subjective: Mental status is clear.  Sitting up in a chair with acute abdominal pain.   Objective: Vital signs in last 24 hours: Temp:  [98.1 F (36.7 C)-98.3 F (36.8 C)] 98.1 F (36.7 C) (01/31 0550) Pulse Rate:  [95-125] 125 (01/31 0550) Resp:  [16-18] 16 (01/31 0550) BP: (115-141)/(79-88) 115/88 mmHg (01/31 0550) SpO2:  [90 %-96 %] 95 % (01/31 0550)  Intake/Output from previous day: 01/30 0701 - 01/31 0700 In: 2765 [I.V.:2365; IV Piggyback:400] Out: 375 [Urine:375] Intake/Output this shift:    Physical Exam: Work of breathing is slightly increased.  Abdomen is tense and tender.  Had flatus this morning after returning from xray.  Pain came on rather suddenly when turning onto her right side.  Pulse rate 118.  Lab Results:  Results for orders placed during the hospital encounter of 10/08/13 (from the past 48 hour(s))  CBC     Status: Abnormal   Collection Time    10/10/13  8:18 AM      Result Value Range   WBC 9.8  4.0 - 10.5 K/uL   RBC 4.28  3.87 - 5.11 MIL/uL   Hemoglobin 8.7 (*) 12.0 - 15.0 g/dL   HCT 30.6 (*) 36.0 - 46.0 %   MCV 71.5 (*) 78.0 - 100.0 fL   MCH 20.3 (*) 26.0 - 34.0 pg   MCHC 28.4 (*) 30.0 - 36.0 g/dL   RDW 17.7 (*) 11.5 - 15.5 %   Platelets 255  150 - 400 K/uL  BASIC METABOLIC PANEL     Status: Abnormal   Collection Time    10/10/13  8:18 AM      Result Value Range   Sodium 136 (*) 137 - 147 mEq/L   Potassium 3.8  3.7 - 5.3 mEq/L   Chloride 99  96 - 112 mEq/L   CO2 28  19 - 32 mEq/L   Glucose, Bld 143 (*) 70 - 99 mg/dL   BUN 11  6 - 23 mg/dL   Creatinine, Ser 1.00  0.50 - 1.10 mg/dL   Calcium 8.5  8.4 - 10.5 mg/dL   GFR calc non Af Amer 69 (*) >90 mL/min   GFR calc Af Amer 81 (*) >90 mL/min   Comment: (NOTE)     The eGFR has been calculated using the CKD EPI equation.     This calculation has not been  validated in all clinical situations.     eGFR's persistently <90 mL/min signify possible Chronic Kidney     Disease.    Radiology/Results: Dg Abd Acute W/chest  10/10/2013   CLINICAL DATA:  Abdominal distention and pain. Postop from gastric sleeve.  EXAM: ACUTE ABDOMEN SERIES (ABDOMEN 2 VIEW & CHEST 1 VIEW)  COMPARISON:  10/09/2013  FINDINGS: Diffuse dilatation of small bowel and colon as well as gastric distention shows no significant change, and multiple air-fluid levels are again seen. This is most consistent with postop ileus. Low lung volumes and bibasilar atelectasis noted. No evidence of pneumothorax or pleural effusion.  IMPRESSION: No significant change in postop ileus pattern.  Bibasilar atelectasis.   Electronically Signed   By: Earle Gell M.D.   On: 10/10/2013 07:51   Dg Duanne Limerick W/water Sol Cm  10/09/2013   CLINICAL DATA:  Status post myotomy and fundoplication wrap  EXAM: WATER SOLUBLE UPPER GI SERIES  TECHNIQUE:  Single-column upper GI series was performed using water soluble contrast.  CONTRAST:  35m OMNIPAQUE IOHEXOL 300 MG/ML  SOLN  COMPARISON:  08/24/2013  FLUOROSCOPY TIME:  1 min 12 seconds  FINDINGS: On the scout radiograph there is moderate-to-marked distension of the stomach and small bowel loops. There are postoperative changes compatible with a fundoplication wrap.  Water-soluble contrast material passes through the fundoplication wrap into the stomach. The stomach is moderately distended with gas and the contrast pools within the proximal stomach. No extravasation of contrast material identified.  IMPRESSION: 1. Patent fundoplication wrap. No evidence for extravasation of oral contrast material. 2. Gaseous distension of the stomach and small bowel loops which may reflect postoperative ileus.   Electronically Signed   By: TKerby MoorsM.D.   On: 10/09/2013 15:52    Anti-infectives: Anti-infectives   Start     Dose/Rate Route Frequency Ordered Stop   10/08/13 1600  ceFAZolin  (ANCEF) IVPB 1 g/50 mL premix     1 g 100 mL/hr over 30 Minutes Intravenous Every 6 hours 10/08/13 1536 10/09/13 0719   10/08/13 0715  ceFAZolin (ANCEF) IVPB 2 g/50 mL premix     2 g 100 mL/hr over 30 Minutes Intravenous On call to O.R. 10/08/13 0650 10/08/13 0920      Assessment/Plan: Problem List: Patient Active Problem List   Diagnosis Date Noted  . Anemia-chronic 10/09/2013  . Achalasia s/p laparoscopic Heller myotomy and Dor fundoplication 121/79/8102 . Cyst in hand 07/06/2013  . Trichomoniasis of vagina 06/03/2013  . GERD (gastroesophageal reflux disease) 03/16/2013  . DEPRESSION 02/10/2009  . HIV DISEASE 03/24/2007  . ANXIETY 03/24/2007  . ASTHMA 03/24/2007    Very distended.  Will get stat CT scan to look for evidence of a leak.  WBC not elevated but may be effected by HIV.   2 Days Post-Op    LOS: 2 days   Matt B. MHassell Done MD, [facility] [phone] 10/10/2013 9:41 AM

## 2013-10-11 ENCOUNTER — Inpatient Hospital Stay (HOSPITAL_COMMUNITY): Payer: Medicare Other

## 2013-10-11 DIAGNOSIS — K220 Achalasia of cardia: Secondary | ICD-10-CM | POA: Diagnosis not present

## 2013-10-11 DIAGNOSIS — J90 Pleural effusion, not elsewhere classified: Secondary | ICD-10-CM | POA: Diagnosis not present

## 2013-10-11 DIAGNOSIS — J9819 Other pulmonary collapse: Secondary | ICD-10-CM | POA: Diagnosis not present

## 2013-10-11 DIAGNOSIS — B20 Human immunodeficiency virus [HIV] disease: Secondary | ICD-10-CM | POA: Diagnosis not present

## 2013-10-11 DIAGNOSIS — K560 Paralytic ileus: Secondary | ICD-10-CM | POA: Diagnosis not present

## 2013-10-11 DIAGNOSIS — R933 Abnormal findings on diagnostic imaging of other parts of digestive tract: Secondary | ICD-10-CM | POA: Diagnosis not present

## 2013-10-11 DIAGNOSIS — K56609 Unspecified intestinal obstruction, unspecified as to partial versus complete obstruction: Secondary | ICD-10-CM | POA: Diagnosis not present

## 2013-10-11 LAB — BASIC METABOLIC PANEL
BUN: 7 mg/dL (ref 6–23)
CALCIUM: 8.5 mg/dL (ref 8.4–10.5)
CO2: 25 meq/L (ref 19–32)
Chloride: 104 mEq/L (ref 96–112)
Creatinine, Ser: 0.92 mg/dL (ref 0.50–1.10)
GFR calc Af Amer: 89 mL/min — ABNORMAL LOW (ref >90)
GFR, EST NON AFRICAN AMERICAN: 77 mL/min — AB (ref >90)
GLUCOSE: 130 mg/dL — AB (ref 70–99)
Potassium: 3.7 mEq/L (ref 3.7–5.3)
Sodium: 138 mEq/L (ref 137–147)

## 2013-10-11 LAB — CBC
HEMATOCRIT: 28.1 % — AB (ref 36.0–46.0)
HEMOGLOBIN: 8.4 g/dL — AB (ref 12.0–15.0)
MCH: 20.8 pg — AB (ref 26.0–34.0)
MCHC: 29.9 g/dL — AB (ref 30.0–36.0)
MCV: 69.7 fL — AB (ref 78.0–100.0)
Platelets: 287 10*3/uL (ref 150–400)
RBC: 4.03 MIL/uL (ref 3.87–5.11)
RDW: 17.7 % — ABNORMAL HIGH (ref 11.5–15.5)
WBC: 10.2 10*3/uL (ref 4.0–10.5)

## 2013-10-11 MED ORDER — FLEET ENEMA 7-19 GM/118ML RE ENEM
2.0000 | ENEMA | Freq: Once | RECTAL | Status: AC
  Administered 2013-10-12: 2 via RECTAL
  Filled 2013-10-11: qty 2

## 2013-10-11 NOTE — Progress Notes (Signed)
Patient ID: Danielle [name], female   DOB: March 15, 1971, 43 y.o.   MRN: [id number] [facility] Surgery Progress Note:   3 Days Post-Op  Subjective: Mental status is clear.  Had 2 liquid stools and is passing flatus Objective: Vital signs in last 24 hours: Temp:  [98 F (36.7 C)-99.7 F (37.6 C)] 98.6 F (37 C) (02/01 0540) Pulse Rate:  [109-128] 114 (02/01 0540) Resp:  [18] 18 (02/01 0540) BP: (105-151)/(73-85) 105/73 mmHg (02/01 0540) SpO2:  [91 %-100 %] 95 % (02/01 0540) Weight:  [181 lb 4.8 oz (82.237 kg)] 181 lb 4.8 oz (82.237 kg) (02/01 0540)  Intake/Output from previous day: 01/31 0701 - 02/01 0700 In: 2435 [I.V.:2435] Out: 1950 [Urine:1950] Intake/Output this shift:    Physical Exam: Work of breathing is not labored.  Feeling better.    Lab Results:  Results for orders placed during the hospital encounter of 10/08/13 (from the past 48 hour(s))  CBC     Status: Abnormal   Collection Time    10/10/13  8:18 AM      Result Value Range   WBC 9.8  4.0 - 10.5 K/uL   RBC 4.28  3.87 - 5.11 MIL/uL   Hemoglobin 8.7 (*) 12.0 - 15.0 g/dL   HCT 30.6 (*) 36.0 - 46.0 %   MCV 71.5 (*) 78.0 - 100.0 fL   MCH 20.3 (*) 26.0 - 34.0 pg   MCHC 28.4 (*) 30.0 - 36.0 g/dL   RDW 17.7 (*) 11.5 - 15.5 %   Platelets 255  150 - 400 K/uL  BASIC METABOLIC PANEL     Status: Abnormal   Collection Time    10/10/13  8:18 AM      Result Value Range   Sodium 136 (*) 137 - 147 mEq/L   Potassium 3.8  3.7 - 5.3 mEq/L   Chloride 99  96 - 112 mEq/L   CO2 28  19 - 32 mEq/L   Glucose, Bld 143 (*) 70 - 99 mg/dL   BUN 11  6 - 23 mg/dL   Creatinine, Ser 1.00  0.50 - 1.10 mg/dL   Calcium 8.5  8.4 - 10.5 mg/dL   GFR calc non Af Amer 69 (*) >90 mL/min   GFR calc Af Amer 81 (*) >90 mL/min   Comment: (NOTE)     The eGFR has been calculated using the CKD EPI equation.     This calculation has not been validated in all clinical situations.     eGFR's persistently <90 mL/min signify possible Chronic Kidney      Disease.  CBC     Status: Abnormal   Collection Time    10/11/13  5:29 AM      Result Value Range   WBC 10.2  4.0 - 10.5 K/uL   RBC 4.03  3.87 - 5.11 MIL/uL   Hemoglobin 8.4 (*) 12.0 - 15.0 g/dL   HCT 28.1 (*) 36.0 - 46.0 %   MCV 69.7 (*) 78.0 - 100.0 fL   MCH 20.8 (*) 26.0 - 34.0 pg   MCHC 29.9 (*) 30.0 - 36.0 g/dL   RDW 17.7 (*) 11.5 - 15.5 %   Platelets 287  150 - 400 K/uL  BASIC METABOLIC PANEL     Status: Abnormal   Collection Time    10/11/13  5:29 AM      Result Value Range   Sodium 138  137 - 147 mEq/L   Potassium 3.7  3.7 - 5.3 mEq/L  Chloride 104  96 - 112 mEq/L   CO2 25  19 - 32 mEq/L   Glucose, Bld 130 (*) 70 - 99 mg/dL   BUN 7  6 - 23 mg/dL   Creatinine, Ser 5.09  0.50 - 1.10 mg/dL   Calcium 8.5  8.4 - 28.8 mg/dL   GFR calc non Af Amer 77 (*) >90 mL/min   GFR calc Af Amer 89 (*) >90 mL/min   Comment: (NOTE)     The eGFR has been calculated using the CKD EPI equation.     This calculation has not been validated in all clinical situations.     eGFR's persistently <90 mL/min signify possible Chronic Kidney     Disease.    Radiology/Results: Ct Abdomen Pelvis W Contrast  10/10/2013   ADDENDUM REPORT: 10/10/2013 16:54  ADDENDUM: As I discussed with Dr. Pollyann Kennedy by our, the small volume of gas in the posterior mediastinum in the vicinity of the esophagus would be anticipated in a recently postoperative patient. The patient underwent negative Gastrografin swallow yesterday. No contrast is seen in the mediastinum or in the pleural effusions. As we discussed, if there is concern for development of esophageal leak since the prior examination, fluoroscopy would be the examination of choice.   Electronically Signed   By: Esperanza Heir M.D.   On: 10/10/2013 16:54   10/10/2013   CLINICAL DATA:  Underwent Heller myotomy and fundoplication 2 days ago with new onset abdominal distension, HIV positive  EXAM: CT ABDOMEN AND PELVIS WITH CONTRAST  TECHNIQUE: Multidetector CT  imaging of the abdomen and pelvis was performed using the standard protocol following bolus administration of intravenous contrast.  CONTRAST:  39mL OMNIPAQUE IOHEXOL 300 MG/ML SOLN, OMNIPAQUE IOHEXOL 300 MG/ML SOLN  COMPARISON:  03/18/2009  FINDINGS: Small bilateral pleural effusions with underlying compressive atelectasis.  In the left lobe of the liver there is a 22 x 41 mm low-attenuation oval lesion. This is not seen on the prior study. Gallbladder, spleen, and pancreas are normal. Adrenal glands and kidneys are normal. Aorta is normal. The uterus is markedly enlarged and heterogeneous.  Bladder is normal except for a small amount of gas within it likely due to recent instrumentation. There is a small to moderate volume of ascites throughout the abdomen and pelvis.  Stomach is distended. Small bowel is diffusely distended with air-fluid levels. Small bowel is distended to about 4 cm. In the right lower quadrant near the terminal ileum, there is a loop of more proximal ileum that is decompressed relative to more proximal bowel. For reasons outlined below, this is felt likely to be due to peristalsis rather than stricture. There is some twisting of the mesentery as seen by a whorling of the mesenteric vessels in the midline on image number 37 through 40. Gas and oral contrast is seen into the cecum, ascending colon, and transverse colon. They are distended as well to a diameter of about 5 cm. Descending colon is decompressed but shows oral contrast within it. At the junction of the proximal and middle thirds of the sigmoid colon there is a 2.5 cm long apple core type lesion causing irregularity and narrowing. There is gas beyond this for several cm, but the distal sigmoid colon and the rectum are decompressed.  There is no significant adenopathy in the abdomen or pelvis. There are no musculoskeletal acute findings.The esophagus is mildly tortuous with surgical clips at the gastroesophageal junction  consistent with recent surgical history.  IMPRESSION: 1. The  findings likely represent a combination of postoperative ileus and incomplete obstruction. Although there are a few loops of small bowel in the right lower quadrant near the ileocecal valve that are narrow, the point of obstruction given the pattern described above appears most likely to be the 2.5 cm a long infiltrating lesion in the sigmoid colon. This is suspicious for carcinoma. 2. Liver lesion, new from 2010. Its appearance is indeterminate but given the findings described above the possibility of hepatic metastasis is considered. MRI is suggested to further characterize. 3. Small to moderate volume of ascites 4. Enlarged fibroid uterus I discussed this case by phone with Dr. Zella Richer at 15:00 on 10/10/13.  Electronically Signed: By: Skipper Cliche M.D. On: 10/10/2013 15:10   Dg Abd Acute W/chest  10/11/2013   CLINICAL DATA:  Follow-up ileus versus partial small bowel obstruction.  EXAM: ACUTE ABDOMEN SERIES (ABDOMEN 2 VIEW & CHEST 1 VIEW)  COMPARISON:  CT and radiographs, 10/10/2013  FINDINGS: Small bowel dilation has decreased. There is contrast that is noted in the right and left colon. The colon is normally distended.  There is mild to moderate distention of the stomach, less than seen on the prior studies.  No free air.  Bilateral lung base opacity is noted consistent with a combination of atelectasis and small effusions. This is stable.  IMPRESSION: 1. Improved partial small bowel obstruction. 2. No free air. 3. Persistent small bilateral effusions with atelectasis.   Electronically Signed   By: Lajean Manes M.D.   On: 10/11/2013 08:27   Dg Abd Acute W/chest  10/10/2013   CLINICAL DATA:  Abdominal distention and pain. Postop from gastric sleeve.  EXAM: ACUTE ABDOMEN SERIES (ABDOMEN 2 VIEW & CHEST 1 VIEW)  COMPARISON:  10/09/2013  FINDINGS: Diffuse dilatation of small bowel and colon as well as gastric distention shows no significant  change, and multiple air-fluid levels are again seen. This is most consistent with postop ileus. Low lung volumes and bibasilar atelectasis noted. No evidence of pneumothorax or pleural effusion.  IMPRESSION: No significant change in postop ileus pattern.  Bibasilar atelectasis.   Electronically Signed   By: Earle Gell M.D.   On: 10/10/2013 07:51   Dg Duanne Limerick W/water Sol Cm  10/09/2013   CLINICAL DATA:  Status post myotomy and fundoplication wrap  EXAM: WATER SOLUBLE UPPER GI SERIES  TECHNIQUE: Single-column upper GI series was performed using water soluble contrast.  CONTRAST:  70m OMNIPAQUE IOHEXOL 300 MG/ML  SOLN  COMPARISON:  08/24/2013  FLUOROSCOPY TIME:  1 min 12 seconds  FINDINGS: On the scout radiograph there is moderate-to-marked distension of the stomach and small bowel loops. There are postoperative changes compatible with a fundoplication wrap.  Water-soluble contrast material passes through the fundoplication wrap into the stomach. The stomach is moderately distended with gas and the contrast pools within the proximal stomach. No extravasation of contrast material identified.  IMPRESSION: 1. Patent fundoplication wrap. No evidence for extravasation of oral contrast material. 2. Gaseous distension of the stomach and small bowel loops which may reflect postoperative ileus.   Electronically Signed   By: TKerby MoorsM.D.   On: 10/09/2013 15:52   Dg Esophagus W/water Sol Cm  10/10/2013   CLINICAL DATA:  Tachycardia, post distal esophageal myotomy for achalasia, question delayed leak  EXAM: ESOPHOGRAM/BARIUM SWALLOW  TECHNIQUE: Single contrast examination was performed utilizing Omnipaque 300 followed by thin barium.  COMPARISON:  CT abdomen and pelvis 10/10/2013  FLUOROSCOPY TIME:  2 min 6 seconds  FINDINGS: Distal esophagus and gastroesophageal junction evaluated at the site of surgery.  Scout image demonstrates gaseous distention of the stomach and a of proximal small bowel loops suggesting ileus.   Gas and contrast present in the colon.  Excreted contrast in the bladder from preceding CT.  Diffuse impairment of esophageal motility with very poor and infrequent peristaltic waves consistent with history of achalasia.  Drainage of contrast from the esophagus is primarily gravity dependent.  Distal esophagus shows normal distention with relative narrowing at the gastroesophageal junction, which may be related to postoperative edema.  Contrast freely passes into the stomach without evidence of obstruction.  No persistent intraluminal filling defects.  With water-soluble contrast, no extravasation is identified to suggest leak.  Subsequent swallows utilizing thin barium also fail to demonstrate a leak at the distal esophagus or gastroesophageal junction.  IMPRESSION: No evidence of postoperative esophageal or gastroesophageal leak.  Severely impaired esophageal motility consistent with history of achalasia.  Gaseous distention of stomach.  Findings discussed with Dr. Zella Richer at completion of procedure.   Electronically Signed   By: Lavonia Dana M.D.   On: 10/10/2013 17:48    Anti-infectives: Anti-infectives   Start     Dose/Rate Route Frequency Ordered Stop   10/08/13 1600  ceFAZolin (ANCEF) IVPB 1 g/50 mL premix     1 g 100 mL/hr over 30 Minutes Intravenous Every 6 hours 10/08/13 1536 10/09/13 0719   10/08/13 0715  ceFAZolin (ANCEF) IVPB 2 g/50 mL premix     2 g 100 mL/hr over 30 Minutes Intravenous On call to O.R. 10/08/13 0650 10/08/13 0920      Assessment/Plan: Problem List: Patient Active Problem List   Diagnosis Date Noted  . Anemia-chronic 10/09/2013  . Achalasia s/p laparoscopic Heller myotomy and Dor fundoplication 82/02/155  . Cyst in hand 07/06/2013  . Trichomoniasis of vagina 06/03/2013  . GERD (gastroesophageal reflux disease) 03/16/2013  . DEPRESSION 02/10/2009  . HIV DISEASE 03/24/2007  . ANXIETY 03/24/2007  . ASTHMA 03/24/2007    Maybe liquid stools from oral  contrast.  Improved.   3 Days Post-Op    LOS: 3 days   Matt B. Hassell Done, MD, [facility] [phone] beeper [phone]  10/11/2013 8:50 AM

## 2013-10-11 NOTE — Consult Note (Signed)
Reason for Consult: Abnormal CT scan - ? Sigmoid colon lesion. Referring Physician: Jackolyn Confer, M.D.  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  The surgery was performed without incident, but pos-operatively she complained of abdominal pain.  A CT scan was negative for any leak, but she was identified to have an ileus.  Additionally, a sigmoid colon apple core lesion was also identified as well as a new lesion in the liver.  There is concern for a malignant process in this site.  Her father had colon cancer in his 72's or 51's.  No reports of personal hematochezia or melena.  On two discrete occassions she had constipation issues.  Her HIV was diagnosed in the 1990's and she always had good control.  Her CD4 averages in the 600-700's.  Past Medical History  Diagnosis Date  . HIV infection   . Anemia   . Asthma   . GERD (gastroesophageal reflux disease)   . Chronic back pain   . Depression   . Bulging lumbar disc   . Anxiety   . Migraines   . Pneumonia     Past Surgical History  Procedure Laterality Date  . Hernia repair    . Other surgical history      biopsy axillary ,right arm  . Cesarean section      twice  . Right arm biopsy    . Esophageal manometry N/A 06/29/2013    Procedure: ESOPHAGEAL MANOMETRY (EM);  Surgeon: Jerene Bears, MD;  Location: [facility];  Service: Gastroenterology;  Laterality: N/A;  . Heller myotomy N/A 10/08/2013    Procedure: LAPAROSCOPIC HELLER MYOTOMY, DOR FUNDIPLICATION, UPPER ENDOSCOPY;  Surgeon: Odis Hollingshead, MD;  Location: [facility];  Service: General;  Laterality: N/A;  . Upper gi endoscopy N/A 10/08/2013    Procedure: UPPER GI ENDOSCOPY;  Surgeon: Odis Hollingshead, MD;  Location: [facility];  Service: General;  Laterality: N/A;    Family History  Problem Relation Age of Onset  . Hyperlipidemia Mother   . Diabetes Mother    . Stroke Mother   . Diabetes Brother   . Colon cancer Father   . Esophageal cancer Neg Hx   . Rectal cancer Neg Hx   . Stomach cancer Neg Hx     Social History:  reports that she has never smoked. She has never used smokeless tobacco. She reports that she does not drink alcohol or use illicit drugs.  Allergies:  Allergies  Allergen Reactions  . Latex Rash    Medications:  Scheduled: . antiseptic oral rinse  15 mL Mouth Rinse q12n4p  . chlorhexidine  15 mL Mouth Rinse BID  . heparin  5,000 Units Subcutaneous Q8H  . pantoprazole (PROTONIX) IV  40 mg Intravenous Q12H  . simethicone  80 mg Oral QID   Continuous: . dextrose 5% lactated ringers with KCl 20 mEq/L 100 mL/hr (10/11/13 0933)    Results for orders placed during the hospital encounter of 10/08/13 (from the past 24 hour(s))  CBC     Status: Abnormal   Collection Time    10/11/13  5:29 AM      Result Value Range   WBC 10.2  4.0 - 10.5 K/uL   RBC 4.03  3.87 - 5.11 MIL/uL   Hemoglobin 8.4 (*) 12.0 - 15.0 g/dL   HCT 28.1 (*) 36.0 - 46.0 %  MCV 69.7 (*) 78.0 - 100.0 fL   MCH 20.8 (*) 26.0 - 34.0 pg   MCHC 29.9 (*) 30.0 - 36.0 g/dL   RDW 17.7 (*) 11.5 - 15.5 %   Platelets 287  150 - 400 K/uL  BASIC METABOLIC PANEL     Status: Abnormal   Collection Time    10/11/13  5:29 AM      Result Value Range   Sodium 138  137 - 147 mEq/L   Potassium 3.7  3.7 - 5.3 mEq/L   Chloride 104  96 - 112 mEq/L   CO2 25  19 - 32 mEq/L   Glucose, Bld 130 (*) 70 - 99 mg/dL   BUN 7  6 - 23 mg/dL   Creatinine, Ser 0.92  0.50 - 1.10 mg/dL   Calcium 8.5  8.4 - 10.5 mg/dL   GFR calc non Af Amer 77 (*) >90 mL/min   GFR calc Af Amer 89 (*) >90 mL/min     Ct Abdomen Pelvis W Contrast  10/10/2013   ADDENDUM REPORT: 10/10/2013 16:54  ADDENDUM: As I discussed with Dr. Constance Holster by our, the small volume of gas in the posterior mediastinum in the vicinity of the esophagus would be anticipated in a recently postoperative patient. The patient  underwent negative Gastrografin swallow yesterday. No contrast is seen in the mediastinum or in the pleural effusions. As we discussed, if there is concern for development of esophageal leak since the prior examination, fluoroscopy would be the examination of choice.   Electronically Signed   By: Skipper Cliche M.D.   On: 10/10/2013 16:54   10/10/2013   CLINICAL DATA:  Underwent Heller myotomy and fundoplication 2 days ago with new onset abdominal distension, HIV positive  EXAM: CT ABDOMEN AND PELVIS WITH CONTRAST  TECHNIQUE: Multidetector CT imaging of the abdomen and pelvis was performed using the standard protocol following bolus administration of intravenous contrast.  CONTRAST:  99mL OMNIPAQUE IOHEXOL 300 MG/ML SOLN, 1107mL OMNIPAQUE IOHEXOL 300 MG/ML SOLN  COMPARISON:  03/18/2009  FINDINGS: Small bilateral pleural effusions with underlying compressive atelectasis.  In the left lobe of the liver there is a 22 x 41 mm low-attenuation oval lesion. This is not seen on the prior study. Gallbladder, spleen, and pancreas are normal. Adrenal glands and kidneys are normal. Aorta is normal. The uterus is markedly enlarged and heterogeneous.  Bladder is normal except for a small amount of gas within it likely due to recent instrumentation. There is a small to moderate volume of ascites throughout the abdomen and pelvis.  Stomach is distended. Small bowel is diffusely distended with air-fluid levels. Small bowel is distended to about 4 cm. In the right lower quadrant near the terminal ileum, there is a loop of more proximal ileum that is decompressed relative to more proximal bowel. For reasons outlined below, this is felt likely to be due to peristalsis rather than stricture. There is some twisting of the mesentery as seen by a whorling of the mesenteric vessels in the midline on image number 37 through 40. Gas and oral contrast is seen into the cecum, ascending colon, and transverse colon. They are distended as well to  a diameter of about 5 cm. Descending colon is decompressed but shows oral contrast within it. At the junction of the proximal and middle thirds of the sigmoid colon there is a 2.5 cm long apple core type lesion causing irregularity and narrowing. There is gas beyond this for several cm, but the distal sigmoid  colon and the rectum are decompressed.  There is no significant adenopathy in the abdomen or pelvis. There are no musculoskeletal acute findings.The esophagus is mildly tortuous with surgical clips at the gastroesophageal junction consistent with recent surgical history.  IMPRESSION: 1. The findings likely represent a combination of postoperative ileus and incomplete obstruction. Although there are a few loops of small bowel in the right lower quadrant near the ileocecal valve that are narrow, the point of obstruction given the pattern described above appears most likely to be the 2.5 cm a long infiltrating lesion in the sigmoid colon. This is suspicious for carcinoma. 2. Liver lesion, new from 2010. Its appearance is indeterminate but given the findings described above the possibility of hepatic metastasis is considered. MRI is suggested to further characterize. 3. Small to moderate volume of ascites 4. Enlarged fibroid uterus I discussed this case by phone with Dr. Zella Richer at 15:00 on 10/10/13.  Electronically Signed: By: Skipper Cliche M.D. On: 10/10/2013 15:10   Dg Abd Acute W/chest  10/11/2013   CLINICAL DATA:  Follow-up ileus versus partial small bowel obstruction.  EXAM: ACUTE ABDOMEN SERIES (ABDOMEN 2 VIEW & CHEST 1 VIEW)  COMPARISON:  CT and radiographs, 10/10/2013  FINDINGS: Small bowel dilation has decreased. There is contrast that is noted in the right and left colon. The colon is normally distended.  There is mild to moderate distention of the stomach, less than seen on the prior studies.  No free air.  Bilateral lung base opacity is noted consistent with a combination of atelectasis and small  effusions. This is stable.  IMPRESSION: 1. Improved partial small bowel obstruction. 2. No free air. 3. Persistent small bilateral effusions with atelectasis.   Electronically Signed   By: Lajean Manes M.D.   On: 10/11/2013 08:27   Dg Abd Acute W/chest  10/10/2013   CLINICAL DATA:  Abdominal distention and pain. Postop from gastric sleeve.  EXAM: ACUTE ABDOMEN SERIES (ABDOMEN 2 VIEW & CHEST 1 VIEW)  COMPARISON:  10/09/2013  FINDINGS: Diffuse dilatation of small bowel and colon as well as gastric distention shows no significant change, and multiple air-fluid levels are again seen. This is most consistent with postop ileus. Low lung volumes and bibasilar atelectasis noted. No evidence of pneumothorax or pleural effusion.  IMPRESSION: No significant change in postop ileus pattern.  Bibasilar atelectasis.   Electronically Signed   By: Earle Gell M.D.   On: 10/10/2013 07:51   Dg Duanne Limerick W/water Sol Cm  10/09/2013   CLINICAL DATA:  Status post myotomy and fundoplication wrap  EXAM: WATER SOLUBLE UPPER GI SERIES  TECHNIQUE: Single-column upper GI series was performed using water soluble contrast.  CONTRAST:  81mL OMNIPAQUE IOHEXOL 300 MG/ML  SOLN  COMPARISON:  08/24/2013  FLUOROSCOPY TIME:  1 min 12 seconds  FINDINGS: On the scout radiograph there is moderate-to-marked distension of the stomach and small bowel loops. There are postoperative changes compatible with a fundoplication wrap.  Water-soluble contrast material passes through the fundoplication wrap into the stomach. The stomach is moderately distended with gas and the contrast pools within the proximal stomach. No extravasation of contrast material identified.  IMPRESSION: 1. Patent fundoplication wrap. No evidence for extravasation of oral contrast material. 2. Gaseous distension of the stomach and small bowel loops which may reflect postoperative ileus.   Electronically Signed   By: Kerby Moors M.D.   On: 10/09/2013 15:52   Dg Esophagus W/water Sol  Cm  10/10/2013   CLINICAL DATA:  Tachycardia,  post distal esophageal myotomy for achalasia, question delayed leak  EXAM: ESOPHOGRAM/BARIUM SWALLOW  TECHNIQUE: Single contrast examination was performed utilizing Omnipaque 300 followed by thin barium.  COMPARISON:  CT abdomen and pelvis 10/10/2013  FLUOROSCOPY TIME:  2 min 6 seconds  FINDINGS: Distal esophagus and gastroesophageal junction evaluated at the site of surgery.  Scout image demonstrates gaseous distention of the stomach and a of proximal small bowel loops suggesting ileus.  Gas and contrast present in the colon.  Excreted contrast in the bladder from preceding CT.  Diffuse impairment of esophageal motility with very poor and infrequent peristaltic waves consistent with history of achalasia.  Drainage of contrast from the esophagus is primarily gravity dependent.  Distal esophagus shows normal distention with relative narrowing at the gastroesophageal junction, which may be related to postoperative edema.  Contrast freely passes into the stomach without evidence of obstruction.  No persistent intraluminal filling defects.  With water-soluble contrast, no extravasation is identified to suggest leak.  Subsequent swallows utilizing thin barium also fail to demonstrate a leak at the distal esophagus or gastroesophageal junction.  IMPRESSION: No evidence of postoperative esophageal or gastroesophageal leak.  Severely impaired esophageal motility consistent with history of achalasia.  Gaseous distention of stomach.  Findings discussed with Dr. Zella Richer at completion of procedure.   Electronically Signed   By: Lavonia Dana M.D.   On: 10/10/2013 17:48    ROS:  As stated above in the HPI otherwise negative.  Blood pressure 105/73, pulse 114, temperature 98.6 F (37 C), temperature source Oral, resp. rate 18, height 5\' 1"  (1.549 m), weight 181 lb 4.8 oz (82.237 kg), last menstrual period 10/08/2013, SpO2 95.00%.    PE: Gen: NAD, Alert and Oriented HEENT:   [place]/AT, EOMI Neck: Supple, no LAD Lungs: CTA Bilaterally CV: RRR without M/G/R ABM: Soft, NTND, +BS Ext: No C/C/E  Assessment/Plan: 1) ? Sigmoid colon lesion. 2) Achalasia s/p myotomy. 3) HIV - Well-controlled.  No prior history of an AIDS defining illness. 4) FH of colon cancer.   Further evaluation with a FFS is required.  She is improved today and passing some stool, but the CT scan findings need evaluation.  She has a family history of colon cancer in her father.  Plan: 1) FFS tomorrow with [facility].  Kennon Encinas D 10/11/2013, 11:38 AM

## 2013-10-12 ENCOUNTER — Encounter (HOSPITAL_COMMUNITY)
Admission: RE | Disposition: A | Discharge status: Home / Self Care | Payer: Medicare Other | Source: Ambulatory Visit | Attending: General Surgery

## 2013-10-12 ENCOUNTER — Encounter (HOSPITAL_COMMUNITY): Payer: Self-pay | Admitting: Internal Medicine

## 2013-10-12 DIAGNOSIS — D649 Anemia, unspecified: Secondary | ICD-10-CM

## 2013-10-12 DIAGNOSIS — R933 Abnormal findings on diagnostic imaging of other parts of digestive tract: Secondary | ICD-10-CM | POA: Diagnosis not present

## 2013-10-12 HISTORY — PX: COLONOSCOPY: SHX5424

## 2013-10-12 SURGERY — COLONOSCOPY
Anesthesia: Moderate Sedation

## 2013-10-12 MED ORDER — MIDAZOLAM HCL 10 MG/2ML IJ SOLN
INTRAMUSCULAR | Status: AC
  Filled 2013-10-12: qty 2

## 2013-10-12 MED ORDER — MIDAZOLAM HCL 10 MG/2ML IJ SOLN
INTRAMUSCULAR | Status: DC | PRN
Start: 2013-10-12 — End: 2013-10-12
  Administered 2013-10-12: 1 mg via INTRAVENOUS
  Administered 2013-10-12 (×2): 2 mg via INTRAVENOUS
  Administered 2013-10-12: 1 mg via INTRAVENOUS

## 2013-10-12 MED ORDER — FENTANYL CITRATE 0.05 MG/ML IJ SOLN
INTRAMUSCULAR | Status: DC | PRN
  Administered 2013-10-12 (×3): 25 ug via INTRAVENOUS

## 2013-10-12 MED ORDER — FENTANYL CITRATE 0.05 MG/ML IJ SOLN
INTRAMUSCULAR | Status: AC
  Filled 2013-10-12: qty 2

## 2013-10-12 NOTE — Progress Notes (Signed)
4 Days Post-Op  Subjective: Feeling better than she did yesterday.  Passing some gas and having liquid BMs.  Occasional nausea.  Hungry.   Objective: Vital signs in last 24 hours: Temp:  [98.3 F (36.8 C)-100.4 F (38 C)] 98.6 F (37 C) (02/02 0541) Pulse Rate:  [94-116] 94 (02/02 0541) Resp:  [18] 18 (02/02 0541) BP: (110-126)/(72-85) 126/82 mmHg (02/02 0541) SpO2:  [96 %-100 %] 96 % (02/02 0541) Last BM Date: 10/11/13  Intake/Output from previous day: 02/01 0701 - 02/02 0700 In: 2400 [I.V.:2400] Out: 1100 [Urine:1100] Intake/Output this shift:    PE: General- In NAD Abdomen-softer and a little less distended, tender around LUQ incision, rare bowel sounds  Lab Results:   Recent Labs  10/10/13 0818 10/11/13 0529  WBC 9.8 10.2  HGB 8.7* 8.4*  HCT 30.6* 28.1*  PLT 255 287   BMET  Recent Labs  10/10/13 0818 10/11/13 0529  NA 136* 138  K 3.8 3.7  CL 99 104  CO2 28 25  GLUCOSE 143* 130*  BUN 11 7  CREATININE 1.00 0.92  CALCIUM 8.5 8.5   PT/INR No results found for this basename: LABPROT, INR,  in the last 72 hours Comprehensive Metabolic Panel:    Component Value Date/Time   NA 138 10/11/2013 0529   NA 136* 10/10/2013 0818   K 3.7 10/11/2013 0529   K 3.8 10/10/2013 0818   CL 104 10/11/2013 0529   CL 99 10/10/2013 0818   CO2 25 10/11/2013 0529   CO2 28 10/10/2013 0818   BUN 7 10/11/2013 0529   BUN 11 10/10/2013 0818   CREATININE 0.92 10/11/2013 0529   CREATININE 1.00 10/10/2013 0818   CREATININE 1.26* 05/25/2013 1232   CREATININE 0.93 10/20/2012 1101   GLUCOSE 130* 10/11/2013 0529   GLUCOSE 143* 10/10/2013 0818   CALCIUM 8.5 10/11/2013 0529   CALCIUM 8.5 10/10/2013 0818   AST 20 10/02/2013 1010   AST 21 05/25/2013 1232   ALT 21 10/02/2013 1010   ALT 20 05/25/2013 1232   ALKPHOS 88 10/02/2013 1010   ALKPHOS 73 05/25/2013 1232   BILITOT 0.2* 10/02/2013 1010   BILITOT 0.3 05/25/2013 1232   PROT 7.9 10/02/2013 1010   PROT 7.2 05/25/2013 1232   ALBUMIN 3.8 10/02/2013 1010    ALBUMIN 4.1 05/25/2013 1232     Studies/Results: Ct Abdomen Pelvis W Contrast  10/10/2013   ADDENDUM REPORT: 10/10/2013 16:54  ADDENDUM: As I discussed with Dr. Constance Holster by our, the small volume of gas in the posterior mediastinum in the vicinity of the esophagus would be anticipated in a recently postoperative patient. The patient underwent negative Gastrografin swallow yesterday. No contrast is seen in the mediastinum or in the pleural effusions. As we discussed, if there is concern for development of esophageal leak since the prior examination, fluoroscopy would be the examination of choice.   Electronically Signed   By: Skipper Cliche M.D.   On: 10/10/2013 16:54   10/10/2013   CLINICAL DATA:  Underwent Heller myotomy and fundoplication 2 days ago with new onset abdominal distension, HIV positive  EXAM: CT ABDOMEN AND PELVIS WITH CONTRAST  TECHNIQUE: Multidetector CT imaging of the abdomen and pelvis was performed using the standard protocol following bolus administration of intravenous contrast.  CONTRAST:  1mL OMNIPAQUE IOHEXOL 300 MG/ML SOLN, 169mL OMNIPAQUE IOHEXOL 300 MG/ML SOLN  COMPARISON:  03/18/2009  FINDINGS: Small bilateral pleural effusions with underlying compressive atelectasis.  In the left lobe of the liver there is a 22  x 41 mm low-attenuation oval lesion. This is not seen on the prior study. Gallbladder, spleen, and pancreas are normal. Adrenal glands and kidneys are normal. Aorta is normal. The uterus is markedly enlarged and heterogeneous.  Bladder is normal except for a small amount of gas within it likely due to recent instrumentation. There is a small to moderate volume of ascites throughout the abdomen and pelvis.  Stomach is distended. Small bowel is diffusely distended with air-fluid levels. Small bowel is distended to about 4 cm. In the right lower quadrant near the terminal ileum, there is a loop of more proximal ileum that is decompressed relative to more proximal bowel. For  reasons outlined below, this is felt likely to be due to peristalsis rather than stricture. There is some twisting of the mesentery as seen by a whorling of the mesenteric vessels in the midline on image number 37 through 40. Gas and oral contrast is seen into the cecum, ascending colon, and transverse colon. They are distended as well to a diameter of about 5 cm. Descending colon is decompressed but shows oral contrast within it. At the junction of the proximal and middle thirds of the sigmoid colon there is a 2.5 cm long apple core type lesion causing irregularity and narrowing. There is gas beyond this for several cm, but the distal sigmoid colon and the rectum are decompressed.  There is no significant adenopathy in the abdomen or pelvis. There are no musculoskeletal acute findings.The esophagus is mildly tortuous with surgical clips at the gastroesophageal junction consistent with recent surgical history.  IMPRESSION: 1. The findings likely represent a combination of postoperative ileus and incomplete obstruction. Although there are a few loops of small bowel in the right lower quadrant near the ileocecal valve that are narrow, the point of obstruction given the pattern described above appears most likely to be the 2.5 cm a long infiltrating lesion in the sigmoid colon. This is suspicious for carcinoma. 2. Liver lesion, new from 2010. Its appearance is indeterminate but given the findings described above the possibility of hepatic metastasis is considered. MRI is suggested to further characterize. 3. Small to moderate volume of ascites 4. Enlarged fibroid uterus I discussed this case by phone with Dr. Zella Richer at 15:00 on 10/10/13.  Electronically Signed: By: Skipper Cliche M.D. On: 10/10/2013 15:10   Dg Abd Acute W/chest  10/11/2013   CLINICAL DATA:  Follow-up ileus versus partial small bowel obstruction.  EXAM: ACUTE ABDOMEN SERIES (ABDOMEN 2 VIEW & CHEST 1 VIEW)  COMPARISON:  CT and radiographs,  10/10/2013  FINDINGS: Small bowel dilation has decreased. There is contrast that is noted in the right and left colon. The colon is normally distended.  There is mild to moderate distention of the stomach, less than seen on the prior studies.  No free air.  Bilateral lung base opacity is noted consistent with a combination of atelectasis and small effusions. This is stable.  IMPRESSION: 1. Improved partial small bowel obstruction. 2. No free air. 3. Persistent small bilateral effusions with atelectasis.   Electronically Signed   By: Lajean Manes M.D.   On: 10/11/2013 08:27   Dg Esophagus W/water Sol Cm  10/10/2013   CLINICAL DATA:  Tachycardia, post distal esophageal myotomy for achalasia, question delayed leak  EXAM: ESOPHOGRAM/BARIUM SWALLOW  TECHNIQUE: Single contrast examination was performed utilizing Omnipaque 300 followed by thin barium.  COMPARISON:  CT abdomen and pelvis 10/10/2013  FLUOROSCOPY TIME:  2 min 6 seconds  FINDINGS: Distal esophagus  and gastroesophageal junction evaluated at the site of surgery.  Scout image demonstrates gaseous distention of the stomach and a of proximal small bowel loops suggesting ileus.  Gas and contrast present in the colon.  Excreted contrast in the bladder from preceding CT.  Diffuse impairment of esophageal motility with very poor and infrequent peristaltic waves consistent with history of achalasia.  Drainage of contrast from the esophagus is primarily gravity dependent.  Distal esophagus shows normal distention with relative narrowing at the gastroesophageal junction, which may be related to postoperative edema.  Contrast freely passes into the stomach without evidence of obstruction.  No persistent intraluminal filling defects.  With water-soluble contrast, no extravasation is identified to suggest leak.  Subsequent swallows utilizing thin barium also fail to demonstrate a leak at the distal esophagus or gastroesophageal junction.  IMPRESSION: No evidence of  postoperative esophageal or gastroesophageal leak.  Severely impaired esophageal motility consistent with history of achalasia.  Gaseous distention of stomach.  Findings discussed with Dr. Zella Richer at completion of procedure.   Electronically Signed   By: Lavonia Dana M.D.   On: 10/10/2013 17:48    Anti-infectives: Anti-infectives   Start     Dose/Rate Route Frequency Ordered Stop   10/08/13 1600  ceFAZolin (ANCEF) IVPB 1 g/50 mL premix     1 g 100 mL/hr over 30 Minutes Intravenous Every 6 hours 10/08/13 1536 10/09/13 0719   10/08/13 0715  ceFAZolin (ANCEF) IVPB 2 g/50 mL premix     2 g 100 mL/hr over 30 Minutes Intravenous On call to O.R. 10/08/13 0650 10/08/13 0920      Assessment Principal Problem:   Achalasia s/p laparoscopic Heller myotomy and Dor fundoplication 01/25/99-FV evidence of leak on two UGIs and CT.   Postop ileus-very slow to improve  Sigmoid colon narrowing leading to partial obstruction on CT. Active Problems:   HIV DISEASE   ASTHMA   Anemia-chronic    LOS: 4 days   Plan: Flex Sig today.   Qadir Folks J 10/12/2013

## 2013-10-12 NOTE — Op Note (Signed)
[facility] [place], [address]   COLONOSCOPY PROCEDURE REPORT  PATIENT: Danielle [name], Danielle [name]  MR#: [id number] BIRTHDATE: 03-07-1971 , 42  yrs. old GENDER: Female ENDOSCOPIST: Jerene Bears, MD REFERRED DV:VOHY Zella Richer, M.D. PROCEDURE DATE:  10/12/2013 PROCEDURE:   Colonoscopy, diagnostic First Screening Colonoscopy - Avg.  risk and is 50 yrs.  old or older - No.  Prior Negative Screening - Now for repeat screening. N/A  History of Adenoma - Now for follow-up colonoscopy & has been > or = to 3 yrs.  N/A  Polyps Removed Today? No.  Recommend repeat exam, <10 yrs? Yes.  High risk (family or personal hx). ASA CLASS:   Class III INDICATIONS:an abnormal CT, post-operative ileus, CT suggesting sigmoid colon lesion. MEDICATIONS: These medications were titrated to patient response per physician's verbal order, Fentanyl 75 mcg IV, and Versed 6 mg IV  DESCRIPTION OF PROCEDURE:   After the risks benefits and alternatives of the procedure were thoroughly explained, informed consent was obtained.  A digital rectal exam revealed several skin tags and possible condylomata.   The Pentax Adult Colon [id number] endoscope was introduced through the anus and advanced to the cecum, which was identified by both the appendix and ileocecal valve. No adverse events experienced.   The quality of the prep was adequate, using Fleets Enemas  The instrument was then slowly withdrawn as the colon was fully examined.   COLON FINDINGS:  Prep was with enemas with planned flex sigmoidoscopy, but given ease and abnormal imaging the scope was advanced to the cecum.  Right colon prep was poor, but able to exclude large lesions.   A normal appearing cecum, ileocecal valve, and appendiceal orifice were identified.  The ascending, hepatic flexure, transverse, splenic flexure, descending, sigmoid colon and rectum appeared unremarkable.  No polyps or cancers were seen with several passes  through the left colon with copious irrigation and lavage.  Retroflexed views in the rectum revealed no abnormalities. The scope was withdrawn and the procedure completed.  COMPLICATIONS: There were no complications.  ENDOSCOPIC IMPRESSION: Normal colon; with poor right colon prep, but adequate to exclude large lesions  RECOMMENDATIONS: 1.  Continue conservative treatment of ileus 2.  Would repeat colonoscopy as an outpatient with full prep given family history of colon cancer and anemia   eSigned:  Jerene Bears, MD 10/12/2013 1:07 PM  cc: The Patient

## 2013-10-12 NOTE — Progress Notes (Signed)
Discuss the results of the colonoscopy with her. No lesions were seen. She is taking a clear liquid diet and has tolerated that well thus far.

## 2013-10-13 ENCOUNTER — Encounter (HOSPITAL_COMMUNITY): Payer: Self-pay | Admitting: Internal Medicine

## 2013-10-13 DIAGNOSIS — K929 Disease of digestive system, unspecified: Secondary | ICD-10-CM | POA: Diagnosis not present

## 2013-10-13 DIAGNOSIS — K560 Paralytic ileus: Secondary | ICD-10-CM | POA: Diagnosis not present

## 2013-10-13 DIAGNOSIS — K220 Achalasia of cardia: Principal | ICD-10-CM

## 2013-10-13 DIAGNOSIS — R933 Abnormal findings on diagnostic imaging of other parts of digestive tract: Secondary | ICD-10-CM | POA: Diagnosis not present

## 2013-10-13 MED ORDER — SIMETHICONE 80 MG PO CHEW: 80.0000 mg | CHEWABLE_TABLET | Freq: Four times a day (QID) | ORAL | Status: DC

## 2013-10-13 MED ORDER — HYDROCODONE-ACETAMINOPHEN 5-325 MG PO TABS: 1.0000 | ORAL_TABLET | ORAL | Status: DC | PRN

## 2013-10-13 NOTE — Progress Notes (Signed)
1 Day Post-Op  Subjective: Passing a lot of gas.  No abdominal pain.  Tolerating full liquid diet. Wants to go home. Objective: Vital signs in last 24 hours: Temp:  [98.6 F (37 C)-100.3 F (37.9 C)] 98.6 F (37 C) (02/03 0600) Pulse Rate:  [67-109] 97 (02/03 0600) Resp:  [8-26] 18 (02/03 0600) BP: (105-156)/(66-86) 108/66 mmHg (02/03 0600) SpO2:  [94 %-100 %] 96 % (02/03 0600) Last BM Date: 10/12/13  Intake/Output from previous day: 02/02 0701 - 02/03 0700 In: 2920 [P.O.:520; I.V.:2400] Out: -  Intake/Output this shift: Total I/O In: 361.7 [I.V.:361.7] Out: -   PE: General- In NAD Abdomen-softer and less distended, tender around LUQ incision, increased bowel sounds  Lab Results:   Recent Labs  10/11/13 0529  WBC 10.2  HGB 8.4*  HCT 28.1*  PLT 287   BMET  Recent Labs  10/11/13 0529  NA 138  K 3.7  CL 104  CO2 25  GLUCOSE 130*  BUN 7  CREATININE 0.92  CALCIUM 8.5   PT/INR No results found for this basename: LABPROT, INR,  in the last 72 hours Comprehensive Metabolic Panel:    Component Value Date/Time   NA 138 10/11/2013 0529   NA 136* 10/10/2013 0818   K 3.7 10/11/2013 0529   K 3.8 10/10/2013 0818   CL 104 10/11/2013 0529   CL 99 10/10/2013 0818   CO2 25 10/11/2013 0529   CO2 28 10/10/2013 0818   BUN 7 10/11/2013 0529   BUN 11 10/10/2013 0818   CREATININE 0.92 10/11/2013 0529   CREATININE 1.00 10/10/2013 0818   CREATININE 1.26* 05/25/2013 1232   CREATININE 0.93 10/20/2012 1101   GLUCOSE 130* 10/11/2013 0529   GLUCOSE 143* 10/10/2013 0818   CALCIUM 8.5 10/11/2013 0529   CALCIUM 8.5 10/10/2013 0818   AST 20 10/02/2013 1010   AST 21 05/25/2013 1232   ALT 21 10/02/2013 1010   ALT 20 05/25/2013 1232   ALKPHOS 88 10/02/2013 1010   ALKPHOS 73 05/25/2013 1232   BILITOT 0.2* 10/02/2013 1010   BILITOT 0.3 05/25/2013 1232   PROT 7.9 10/02/2013 1010   PROT 7.2 05/25/2013 1232   ALBUMIN 3.8 10/02/2013 1010   ALBUMIN 4.1 05/25/2013 1232     Studies/Results: No results  found.  Anti-infectives: Anti-infectives   Start     Dose/Rate Route Frequency Ordered Stop   10/08/13 1600  ceFAZolin (ANCEF) IVPB 1 g/50 mL premix     1 g 100 mL/hr over 30 Minutes Intravenous Every 6 hours 10/08/13 1536 10/09/13 0719   10/08/13 0715  ceFAZolin (ANCEF) IVPB 2 g/50 mL premix     2 g 100 mL/hr over 30 Minutes Intravenous On call to O.R. 10/08/13 0650 10/08/13 0920      Assessment Principal Problem:   Achalasia s/p laparoscopic Heller myotomy and Dor fundoplication 8/67/61-PJ evidence of leak on two UGIs and CT.   Postop ileus-improving; tolerating diet.  Sigmoid colon narrowing leading to partial obstruction on CT-no lesion seen on colonoscopy yesterday. Active Problems:   HIV DISEASE   ASTHMA   Anemia-chronic    LOS: 5 days   Plan: Discharge today.  Detailed instructions given.   Bev Drennen Lenna Sciara 10/13/2013

## 2013-10-13 NOTE — Progress Notes (Signed)
Discharge instructions reviewed with patient and spouse, diet education enforced, iv removed, questions and concerns answered Neta Mends RN 10-13-2013 12:49pm

## 2013-10-13 NOTE — Progress Notes (Signed)
I agree with the above documentation, including the assessment and plan.  

## 2013-10-13 NOTE — Progress Notes (Signed)
    Progress Note   Subjective  Feels okay except for "gas buildup in stomach", wants to go home.    Objective   Vital signs in last 24 hours: Temp:  [98.6 F (37 C)-100.3 F (37.9 C)] 98.6 F (37 C) (02/03 0600) Pulse Rate:  [67-109] 97 (02/03 0600) Resp:  [8-26] 18 (02/03 0600) BP: (105-156)/(64-86) 108/66 mmHg (02/03 0600) SpO2:  [94 %-100 %] 96 % (02/03 0600) Last BM Date: 10/12/13 General:    Pleasant black female in NAD Heart:  Regular rate and rhythm Abdomen:  Soft, mildly distended with tympany. Normal bowel sounds. Extremities:  Without edema. Neurologic:  Alert and oriented,  grossly normal neurologically. Psych:  Cooperative. Normal mood and affect.  Lab Results:  Recent Labs  10/11/13 0529  WBC 10.2  HGB 8.4*  HCT 28.1*  PLT 287   BMET  Recent Labs  10/11/13 0529  NA 138  K 3.7  CL 104  CO2 25  GLUCOSE 130*  BUN 7  CREATININE 0.92  CALCIUM 8.5      Assessment / Plan:   1. Achalasia, s/p Heller's myotomy. Tolerating liquids, for fulls today.   2. Possible sigmoid mass by CTscan but no lesion found on colonoscopy yesterday. See #3  3. Microcytic anemia, probably multifactorial but needs a complete outpatient colonoscopy.   4. Abdominal distention, likely post-op ileus. SB dilation improved on last plain film 10/11/13.  Taking Mylicon. No nausea or vomiting. Should improve with time, increased mobility. She isn't requiring pain meds so that is helpful.   4. [facility] of colon cancer in father. Right colon inadequately prepped for colonoscopy yesterday (original plan was just for flex sig). She will need full prep and complete colonoscopy as outpatient given +FMH and microcytic anemia.   5. HIV, on treatment with ID   LOS: 5 days   Tye Savoy  10/13/2013, 9:01 AM

## 2013-10-22 ENCOUNTER — Telehealth: Payer: Self-pay | Admitting: *Deleted

## 2013-10-22 NOTE — Telephone Encounter (Signed)
F/u hospital COLON in 2 months. lmom for pt to call back.

## 2013-10-22 NOTE — Telephone Encounter (Signed)
Message copied by Lance Morin on Thu Oct 22, 2013  1:47 PM ------      Message from: Jerene Bears      Created: Thu Oct 22, 2013 12:35 PM       She needs follow-up office visit in about 2 months      ----- Message -----         From: Olene Floss, RN         Sent: 10/22/2013  12:31 PM           To: Jerene Bears, MD            I don't need to do anything to this pt, do I? Thanks.       ------

## 2013-10-22 NOTE — Telephone Encounter (Signed)
Scheduled pt for 12/18/13 at 4pm. Pt stated understanding.

## 2013-10-27 NOTE — Discharge Summary (Signed)
Physician Discharge Summary  Patient ID: Danielle [name] MRN: [id number] DOB/AGE: 1971/01/13 43 y.o.  Admit date: 10/08/2013 Discharge date: 10/13/2013  Admission Diagnoses:  Achalasia  Discharge Diagnoses:    Achalasia s/p laparoscopic Heller myotomy and Dor fundoplication   Post operative ileus   HIV DISEASE   ASTHMA   Anemia-chronic   Abnormal finding on GI tract imaging with normal colonoscopy   Discharged Condition: good  Hospital Course: She was admitted to the hospital and underwent a laparoscopic Heller myotomy and Dor fundoplication. She developed an ileus on her first and second postoperative day and was very distended. On the first postoperative day an upper GI demonstrated no evidence of leak. She had persistent tachycardia so CT scan was performed which demonstrated what was felt to be a possible large intestinal obstruction. I also repeated the upper GI study for a second time which showed no leak. She started passing gas and had small bowel movements but was still distended and uncomfortable. She was afebrile. A gastroenterology consultation was obtained and because of the finding on the CT scan concerning for partial large bowel obstruction a colonoscopy was performed. No evidence of obstruction or lesion was noted. She rapidly improved after that. We started her on a full liquid diet which he tolerated well. Her bowels are moving. Her distention improved. She subsequently was able to be discharged that October 13, 2013. Specific discharge instructions were given to her.  Consults: GI-Dr. Rhea Belton.  Significant Diagnostic Studies: UGI, CT  Treatments: surgery: Laparoscopic Heller myotomy and Dor fundoplication. Colonoscopy.  Discharge Exam: Blood pressure 108/66, pulse 97, temperature 98.6 F (37 C), temperature source Oral, resp. rate 18, height 5\' 1"  (1.549 m), weight 181 lb 4.8 oz (82.237 kg), last menstrual period 10/08/2013, SpO2 96.00%.   Disposition: 01-Home or  Self Care   Future Appointments Provider Department Dept Phone   11/02/2013 9:10 AM Adolph Pollack, MD [facility], [place] [phone]   11/03/2013 2:30 PM Roderic Scarce, [facility] for Infectious Disease [phone]   11/17/2013 1:30 PM Roderic Scarce, [place] [facility] for Infectious Disease [phone]   12/01/2013 11:00 AM Roderic Scarce, [place] [facility] for Infectious Disease [phone]   12/18/2013 4:00 PM Beverley Fiedler, MD [facility] Gastroenterology [phone]   12/21/2013 10:30 AM [facility] for Infectious Disease [phone]   01/04/2014 10:30 AM Ginnie Smart, MD [facility] for Infectious Disease [phone]       Medication List         DULoxetine 30 MG capsule  Commonly known as:  CYMBALTA  Take 30 mg by mouth every morning.     emtricitabine-tenofovir 200-300 MG per tablet  Commonly known as:  TRUVADA  Take 1 tablet by mouth every evening.     HYDROcodone-acetaminophen 10-325 MG per tablet  Commonly known as:  NORCO  Take 1 tablet by mouth every 6 (six) hours as needed for moderate pain.     HYDROcodone-acetaminophen 5-325 MG per tablet  Commonly known as:  NORCO/VICODIN  Take 1-2 tablets by mouth every 4 (four) hours as needed.     ibuprofen 200 MG tablet  Commonly known as:  ADVIL,MOTRIN  Take 200 mg by mouth every 6 (six) hours as needed for fever, headache or mild pain.     meloxicam 15 MG tablet  Commonly known as:  MOBIC  Take 15 mg by mouth daily.     oxybutynin 10 MG  24 hr tablet  Commonly known as:  DITROPAN-XL  Take 10 mg by mouth daily.     pantoprazole 20 MG tablet  Commonly known as:  PROTONIX  Take 20 mg by mouth daily.     ranitidine 150 MG tablet  Commonly known as:  ZANTAC  Take 150 mg by mouth every evening.     simethicone 80 MG chewable tablet  Commonly known as:  MYLICON  Chew 1 tablet (80 mg total) by mouth  4 (four) times daily.     TIVICAY 50 MG tablet  Generic drug:  dolutegravir  Take 50 mg by mouth every evening.     traZODone 50 MG tablet  Commonly known as:  DESYREL  Take 50 mg by mouth at bedtime as needed for sleep. As directed         Signed: Chesnie Capell J 10/27/2013, 9:31 AM

## 2013-11-02 ENCOUNTER — Other Ambulatory Visit: Payer: Self-pay | Admitting: Infectious Diseases

## 2013-11-02 ENCOUNTER — Ambulatory Visit (INDEPENDENT_AMBULATORY_CARE_PROVIDER_SITE_OTHER): Payer: Medicare Other | Admitting: General Surgery

## 2013-11-02 ENCOUNTER — Encounter (INDEPENDENT_AMBULATORY_CARE_PROVIDER_SITE_OTHER): Payer: Self-pay | Admitting: General Surgery

## 2013-11-02 VITALS — BP 110/70 | HR 68 | Resp 16 | Ht 61.0 in | Wt 165.0 lb

## 2013-11-02 DIAGNOSIS — Z4889 Encounter for other specified surgical aftercare: Secondary | ICD-10-CM

## 2013-11-02 NOTE — Progress Notes (Signed)
Procedure:  Laparoscopic Heller myotomy and Dor fundoplication  Date:  84/16/6063  Pathology:  na  History:  She is here for her first postoperative visit. She is tolerating a level I and some level II diet foods. No heartburn.  Exam: General- Is in NAD. Abdomen-soft, incisions are clean and intact.  Assessment:  Progressing satisfactorily.  Plan:  Continue light activities. Advance to full level to diet and if tolerated level III diet. Return visit 3 weeks.

## 2013-11-02 NOTE — Patient Instructions (Signed)
Advanced to level II diet and if tolerated advance to level III diet. Avoid overheating. Continue light activities.

## 2013-11-03 ENCOUNTER — Ambulatory Visit: Payer: Medicare Other

## 2013-11-03 DIAGNOSIS — F330 Major depressive disorder, recurrent, mild: Secondary | ICD-10-CM
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
## 2013-11-14 DIAGNOSIS — R5383 Other fatigue: Secondary | ICD-10-CM | POA: Diagnosis not present

## 2013-11-14 DIAGNOSIS — K219 Gastro-esophageal reflux disease without esophagitis: Secondary | ICD-10-CM | POA: Diagnosis not present

## 2013-11-14 DIAGNOSIS — R5381 Other malaise: Secondary | ICD-10-CM | POA: Diagnosis not present

## 2013-11-14 DIAGNOSIS — G8929 Other chronic pain: Secondary | ICD-10-CM | POA: Diagnosis not present

## 2013-11-16 DIAGNOSIS — N3941 Urge incontinence: Secondary | ICD-10-CM | POA: Diagnosis not present

## 2013-11-16 DIAGNOSIS — R350 Frequency of micturition: Secondary | ICD-10-CM | POA: Diagnosis not present

## 2013-11-17 ENCOUNTER — Ambulatory Visit: Payer: Medicare Other | Admitting: Internal Medicine

## 2013-11-17 ENCOUNTER — Ambulatory Visit: Payer: Medicare Other

## 2013-11-17 DIAGNOSIS — F331 Major depressive disorder, recurrent, moderate: Secondary | ICD-10-CM | POA: Diagnosis not present
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
## 2013-11-19 ENCOUNTER — Ambulatory Visit (INDEPENDENT_AMBULATORY_CARE_PROVIDER_SITE_OTHER): Payer: Medicare Other | Admitting: General Surgery

## 2013-11-19 ENCOUNTER — Encounter (INDEPENDENT_AMBULATORY_CARE_PROVIDER_SITE_OTHER): Payer: Self-pay | Admitting: General Surgery

## 2013-11-19 VITALS — BP 124/80 | HR 75 | Temp 98.9°F | Resp 14 | Ht 61.0 in | Wt 163.0 lb

## 2013-11-19 DIAGNOSIS — Z4889 Encounter for other specified surgical aftercare: Secondary | ICD-10-CM

## 2013-11-19 NOTE — Patient Instructions (Signed)
Advance diet as tolerated. Do not overeat. Try simethicone tablets for the gas.  May resume normal activities.

## 2013-11-19 NOTE — Progress Notes (Signed)
Procedure:  Laparoscopic Heller myotomy and Dor fundoplication  Date:  48/88/9169  Pathology:  na  History:  She is here for her second postoperative visit. She is tolerating a level III diet well. She still has some incisional soreness at times. She is having a lot of gas. Exam: General- Is in NAD. Abdomen-soft, incisions are clean and intact.  Assessment:  Continues to progress well postoperatively.  Plan: Diet and activities as tolerated. Simethicone as needed for gas. Return visit in 6 weeks.

## 2013-12-01 ENCOUNTER — Ambulatory Visit: Payer: Medicare Other

## 2013-12-05 ENCOUNTER — Other Ambulatory Visit: Payer: Self-pay | Admitting: Infectious Diseases

## 2013-12-09 ENCOUNTER — Other Ambulatory Visit: Payer: Self-pay | Admitting: Infectious Diseases

## 2013-12-14 ENCOUNTER — Encounter: Payer: Self-pay | Admitting: Internal Medicine

## 2013-12-15 ENCOUNTER — Ambulatory Visit: Payer: Medicare Other

## 2013-12-18 ENCOUNTER — Ambulatory Visit: Payer: Medicare Other | Admitting: Internal Medicine

## 2013-12-21 ENCOUNTER — Other Ambulatory Visit (INDEPENDENT_AMBULATORY_CARE_PROVIDER_SITE_OTHER): Payer: Medicare Other

## 2013-12-21 DIAGNOSIS — Z79899 Other long term (current) drug therapy: Secondary | ICD-10-CM

## 2013-12-21 DIAGNOSIS — B20 Human immunodeficiency virus [HIV] disease: Secondary | ICD-10-CM | POA: Diagnosis not present

## 2013-12-21 DIAGNOSIS — Z113 Encounter for screening for infections with a predominantly sexual mode of transmission: Secondary | ICD-10-CM | POA: Diagnosis not present

## 2013-12-21 LAB — LIPID PANEL
Cholesterol: 146 mg/dL (ref 0–200)
HDL: 39 mg/dL — ABNORMAL LOW (ref >39)
LDL Cholesterol: 82 mg/dL (ref 0–99)
TRIGLYCERIDES: 125 mg/dL (ref <150)
Total CHOL/HDL Ratio: 3.7 Ratio
VLDL: 25 mg/dL (ref 0–40)

## 2013-12-21 LAB — CBC WITH DIFFERENTIAL/PLATELET
BASOS ABS: 0.1 10*3/uL (ref 0.0–0.1)
BASOS PCT: 1 % (ref 0–1)
EOS PCT: 2 % (ref 0–5)
Eosinophils Absolute: 0.1 10*3/uL (ref 0.0–0.7)
HCT: 28.6 % — ABNORMAL LOW (ref 36.0–46.0)
Hemoglobin: 8.2 g/dL — ABNORMAL LOW (ref 12.0–15.0)
Lymphocytes Relative: 43 % (ref 12–46)
Lymphs Abs: 2.2 10*3/uL (ref 0.7–4.0)
MCH: 19.2 pg — ABNORMAL LOW (ref 26.0–34.0)
MCHC: 28.7 g/dL — AB (ref 30.0–36.0)
MCV: 66.8 fL — ABNORMAL LOW (ref 78.0–100.0)
Monocytes Absolute: 0.4 10*3/uL (ref 0.1–1.0)
Monocytes Relative: 7 % (ref 3–12)
NEUTROS ABS: 2.4 10*3/uL (ref 1.7–7.7)
Neutrophils Relative %: 47 % (ref 43–77)
Platelets: 305 10*3/uL (ref 150–400)
RBC: 4.28 MIL/uL (ref 3.87–5.11)
RDW: 17.2 % — AB (ref 11.5–15.5)
WBC: 5.1 10*3/uL (ref 4.0–10.5)

## 2013-12-21 LAB — COMPLETE METABOLIC PANEL WITH GFR
ALBUMIN: 4.1 g/dL (ref 3.5–5.2)
ALT: 15 U/L (ref 0–35)
AST: 18 U/L (ref 0–37)
Alkaline Phosphatase: 93 U/L (ref 39–117)
BUN: 9 mg/dL (ref 6–23)
CALCIUM: 9.5 mg/dL (ref 8.4–10.5)
CHLORIDE: 106 meq/L (ref 96–112)
CO2: 24 meq/L (ref 19–32)
Creat: 1.16 mg/dL — ABNORMAL HIGH (ref 0.50–1.10)
GFR, EST NON AFRICAN AMERICAN: 59 mL/min — AB
GFR, Est African American: 68 mL/min
GLUCOSE: 93 mg/dL (ref 70–99)
POTASSIUM: 3.9 meq/L (ref 3.5–5.3)
SODIUM: 139 meq/L (ref 135–145)
TOTAL PROTEIN: 6.9 g/dL (ref 6.0–8.3)
Total Bilirubin: 0.3 mg/dL (ref 0.2–1.2)

## 2013-12-21 LAB — RPR

## 2013-12-22 LAB — T-HELPER CELL (CD4) - (RCID CLINIC ONLY)
CD4 T CELL HELPER: 30 % — AB (ref 33–55)
CD4 T Cell Abs: 640 /uL (ref 400–2700)

## 2013-12-24 LAB — HIV-1 RNA QUANT-NO REFLEX-BLD
HIV 1 RNA Quant: <20 copies/mL (ref <20)
HIV-1 RNA Quant, Log: <1.3 {Log} (ref <1.30)

## 2013-12-29 ENCOUNTER — Encounter (INDEPENDENT_AMBULATORY_CARE_PROVIDER_SITE_OTHER): Payer: Medicare Other | Admitting: General Surgery

## 2014-01-04 ENCOUNTER — Encounter: Payer: Self-pay | Admitting: Infectious Diseases

## 2014-01-04 ENCOUNTER — Telehealth: Payer: Self-pay | Admitting: *Deleted

## 2014-01-04 ENCOUNTER — Ambulatory Visit (INDEPENDENT_AMBULATORY_CARE_PROVIDER_SITE_OTHER): Payer: Medicare Other | Admitting: Infectious Diseases

## 2014-01-04 VITALS — BP 117/72 | HR 85 | Temp 98.7°F | Ht 61.0 in | Wt 160.0 lb

## 2014-01-04 DIAGNOSIS — B20 Human immunodeficiency virus [HIV] disease: Secondary | ICD-10-CM

## 2014-01-04 DIAGNOSIS — K219 Gastro-esophageal reflux disease without esophagitis: Secondary | ICD-10-CM | POA: Diagnosis not present

## 2014-01-04 DIAGNOSIS — D649 Anemia, unspecified: Secondary | ICD-10-CM

## 2014-01-04 NOTE — Assessment & Plan Note (Signed)
She is having issues with getting her GI tract re-oriented after her surgery. She needs to be compliant with diet.

## 2014-01-04 NOTE — Progress Notes (Signed)
   Subjective:    Patient ID: Danielle [name], female    DOB: 04/26/1971, 43 y.o.   MRN: [id number]  HPI 43 yo F with hx of HIV+ and GERD. Taking ATVr/TRV. Prev CR had increased slightly. Last 1.16. Had heller myotomy for achalasia on Oct 08, 2013. Has had some improvement, had some pain last week due to dietary indiscretion. Has missed meds due to GI upset.    HIV 1 RNA Quant (copies/mL)  Date Value  12/21/2013 <20   05/25/2013 <20   10/20/2012 <20      CD4 T Cell Abs (/uL)  Date Value  12/21/2013 640   05/25/2013 690   10/20/2012 680      Review of Systems  Constitutional: Negative for fever, chills, appetite change and unexpected weight change.  Gastrointestinal: Positive for nausea, abdominal pain and diarrhea.  Genitourinary: Positive for vaginal bleeding and menstrual problem. Negative for difficulty urinating.       Objective:   Physical Exam  Constitutional: She appears well-developed and well-nourished.  HENT:  Mouth/Throat: No oropharyngeal exudate.  Eyes: EOM are normal. Pupils are equal, round, and reactive to light.  Neck: Neck supple.  Cardiovascular: Normal rate, regular rhythm and normal heart sounds.   Pulmonary/Chest: Effort normal and breath sounds normal.  Abdominal: Soft. Bowel sounds are normal. There is no tenderness. There is no rebound.  Lymphadenopathy:    She has no cervical adenopathy.          Assessment & Plan:

## 2014-01-04 NOTE — Assessment & Plan Note (Signed)
She appears to be doing well. She is having with issues with taking meds due to diet. She is given condoms. vax are up to date. Will see her back in 6 months.

## 2014-01-04 NOTE — Assessment & Plan Note (Signed)
Suspect due to heavy menses, will have her seen by Gyn.

## 2014-01-04 NOTE — Telephone Encounter (Signed)
Referral faxed to [facility] for abnormal periods and anemia. [name]

## 2014-01-05 ENCOUNTER — Ambulatory Visit: Payer: Medicare Other

## 2014-01-05 DIAGNOSIS — F331 Major depressive disorder, recurrent, moderate: Secondary | ICD-10-CM | POA: Diagnosis not present

## 2014-01-05 NOTE — Progress Notes (Signed)
Danielle [name] was hyperverbal again today, talking about how her husband and she failed to report some of his income last year to [employer] and now they are saying she will lose it.  This was very upsetting to her.  She said he is currently receiving "Worker's Compensation" and he will be able to pay the full rent.  They are also working on finding a less expensive house to rent.  She said she felt like it was her husband's responsibility to report this, but she also admitted that it was in her name and not his.  She reported that it felt good to talk it out and she seemed less stressed at the end of the session.

## 2014-01-07 ENCOUNTER — Other Ambulatory Visit: Payer: Self-pay | Admitting: Infectious Diseases

## 2014-01-08 ENCOUNTER — Ambulatory Visit (INDEPENDENT_AMBULATORY_CARE_PROVIDER_SITE_OTHER): Payer: Medicare Other | Admitting: General Surgery

## 2014-01-08 ENCOUNTER — Encounter (INDEPENDENT_AMBULATORY_CARE_PROVIDER_SITE_OTHER): Payer: Self-pay | Admitting: General Surgery

## 2014-01-08 VITALS — BP 124/78 | HR 75 | Temp 97.4°F | Ht 61.0 in | Wt 163.2 lb

## 2014-01-08 DIAGNOSIS — Z4889 Encounter for other specified surgical aftercare: Secondary | ICD-10-CM

## 2014-01-08 NOTE — Progress Notes (Signed)
Procedure:  Laparoscopic Heller myotomy and Dor fundoplication  Date:  03/00/9233  Pathology:  na  History:  She is here for another postoperative visit. She has been tolerating most foods. She states beans and rice sometimes feel like they get stuck. Exam: General- Is in NAD. Abdomen-soft, incisions are clean and intact.  Assessment:  She continues to progress well. Having some problems with eating beans and rice in moderate to large quantities.  Plan:  We discussed the possibility that she may need to avoid some foods. Overall, she is significantly better. I told her she could use an over-the-counter Pepcid if she had some heartburn. Return visit as needed.

## 2014-01-08 NOTE — Patient Instructions (Signed)
Activities as tolerated. There are certain foods that you may need to avoid as we discussed.

## 2014-01-12 ENCOUNTER — Other Ambulatory Visit: Payer: Self-pay | Admitting: Infectious Diseases

## 2014-01-14 DIAGNOSIS — F319 Bipolar disorder, unspecified: Secondary | ICD-10-CM | POA: Diagnosis not present

## 2014-01-21 ENCOUNTER — Ambulatory Visit: Payer: Medicare Other

## 2014-01-21 DIAGNOSIS — F331 Major depressive disorder, recurrent, moderate: Secondary | ICD-10-CM

## 2014-01-21 NOTE — Progress Notes (Signed)
Danielle [name] was in a fairly good mood today, reporting that she and her family were almost evicted, but that a woman in the office said she would work with them and let them stay in the house.  She was walking slow when she arrived and explained that her back has been hurting after she walked with her son to his school one Saturday morning, taking him to a EOG prep class.  She also reported that she went to [facility] for medication management and they told her to restart her Cymbalta, which she had stopped taking.  Plan to meet again in 3 weeks.

## 2014-02-02 ENCOUNTER — Other Ambulatory Visit: Payer: Self-pay | Admitting: Infectious Diseases

## 2014-02-11 DIAGNOSIS — F3111 Bipolar disorder, current episode manic without psychotic features, mild: Secondary | ICD-10-CM | POA: Diagnosis not present

## 2014-02-11 DIAGNOSIS — F319 Bipolar disorder, unspecified: Secondary | ICD-10-CM | POA: Diagnosis not present

## 2014-02-16 ENCOUNTER — Ambulatory Visit: Payer: Medicare Other

## 2014-02-25 DIAGNOSIS — F3110 Bipolar disorder, current episode manic without psychotic features, unspecified: Secondary | ICD-10-CM | POA: Diagnosis not present

## 2014-02-25 DIAGNOSIS — F319 Bipolar disorder, unspecified: Secondary | ICD-10-CM | POA: Diagnosis not present

## 2014-03-02 ENCOUNTER — Ambulatory Visit: Payer: Medicare Other

## 2014-03-09 DIAGNOSIS — J209 Acute bronchitis, unspecified: Secondary | ICD-10-CM | POA: Diagnosis not present

## 2014-03-09 DIAGNOSIS — J029 Acute pharyngitis, unspecified: Secondary | ICD-10-CM | POA: Diagnosis not present

## 2014-03-15 ENCOUNTER — Other Ambulatory Visit: Payer: Self-pay | Admitting: Infectious Diseases

## 2014-03-16 ENCOUNTER — Ambulatory Visit: Payer: Medicare Other

## 2014-03-16 DIAGNOSIS — F319 Bipolar disorder, unspecified: Secondary | ICD-10-CM

## 2014-03-16 NOTE — Progress Notes (Signed)
Danielle [name] reported today that she finally went to [facility] for a psychiatric evaluation and they diagnosed her with Bipolar Disorder.  She was anxious to see my reaction to this and I told her I wasn't surprised and that I could see how they would reach that conclusion.  We discussed some of the symptoms of mania and she endorsed many of them.  She went on to talk about getting sick recently and then about her son getting in trouble on the last day of school by having a toy gun.  Overall, she seemed amused by her new diagnosis and appears to be somewhat stable at this time.

## 2014-03-17 DIAGNOSIS — F319 Bipolar disorder, unspecified: Secondary | ICD-10-CM | POA: Diagnosis not present

## 2014-03-25 ENCOUNTER — Other Ambulatory Visit: Payer: Self-pay | Admitting: Infectious Diseases

## 2014-03-26 ENCOUNTER — Telehealth: Payer: Self-pay | Admitting: *Deleted

## 2014-03-26 NOTE — Telephone Encounter (Signed)
Please advise on patient's HIV regimen.  Per notes, she is on ATZr/TRV. Current prescriptions are Truvada and Tivicay.   Landis Gandy, RN

## 2014-03-30 ENCOUNTER — Other Ambulatory Visit: Payer: Self-pay | Admitting: *Deleted

## 2014-03-30 DIAGNOSIS — B20 Human immunodeficiency virus [HIV] disease: Secondary | ICD-10-CM

## 2014-03-30 MED ORDER — EMTRICITABINE-TENOFOVIR DF 200-300 MG PO TABS
ORAL_TABLET | ORAL | Status: DC
Start: 2014-03-30 — End: 2014-11-12

## 2014-03-30 MED ORDER — DOLUTEGRAVIR SODIUM 50 MG PO TABS
50.0000 mg | ORAL_TABLET | Freq: Every evening | ORAL | Status: DC
Start: 2014-03-30 — End: 2014-10-11

## 2014-04-07 DIAGNOSIS — F319 Bipolar disorder, unspecified: Secondary | ICD-10-CM | POA: Diagnosis not present

## 2014-04-20 DIAGNOSIS — F319 Bipolar disorder, unspecified: Secondary | ICD-10-CM | POA: Diagnosis not present

## 2014-04-29 DIAGNOSIS — R51 Headache: Secondary | ICD-10-CM | POA: Diagnosis not present

## 2014-04-29 DIAGNOSIS — Z0000 Encounter for general adult medical examination without abnormal findings: Secondary | ICD-10-CM | POA: Diagnosis not present

## 2014-04-29 DIAGNOSIS — G8929 Other chronic pain: Secondary | ICD-10-CM | POA: Diagnosis not present

## 2014-04-29 DIAGNOSIS — E119 Type 2 diabetes mellitus without complications: Secondary | ICD-10-CM | POA: Diagnosis not present

## 2014-04-29 DIAGNOSIS — M549 Dorsalgia, unspecified: Secondary | ICD-10-CM | POA: Diagnosis not present

## 2014-04-29 DIAGNOSIS — IMO0002 Reserved for concepts with insufficient information to code with codable children: Secondary | ICD-10-CM | POA: Diagnosis not present

## 2014-04-29 DIAGNOSIS — E7800 Pure hypercholesterolemia, unspecified: Secondary | ICD-10-CM | POA: Diagnosis not present

## 2014-04-30 DIAGNOSIS — F319 Bipolar disorder, unspecified: Secondary | ICD-10-CM | POA: Diagnosis not present

## 2014-05-04 ENCOUNTER — Ambulatory Visit: Payer: Medicare Other

## 2014-05-04 DIAGNOSIS — F331 Major depressive disorder, recurrent, moderate: Secondary | ICD-10-CM | POA: Diagnosis not present

## 2014-05-13 ENCOUNTER — Other Ambulatory Visit: Payer: Self-pay | Admitting: Infectious Diseases

## 2014-05-14 ENCOUNTER — Ambulatory Visit: Payer: Medicare Other

## 2014-05-14 DIAGNOSIS — D649 Anemia, unspecified: Secondary | ICD-10-CM | POA: Diagnosis not present

## 2014-05-14 DIAGNOSIS — IMO0002 Reserved for concepts with insufficient information to code with codable children: Secondary | ICD-10-CM | POA: Diagnosis not present

## 2014-05-14 DIAGNOSIS — R5383 Other fatigue: Secondary | ICD-10-CM | POA: Diagnosis not present

## 2014-05-14 DIAGNOSIS — G8929 Other chronic pain: Secondary | ICD-10-CM | POA: Diagnosis not present

## 2014-05-14 DIAGNOSIS — R5381 Other malaise: Secondary | ICD-10-CM | POA: Diagnosis not present

## 2014-05-18 ENCOUNTER — Ambulatory Visit: Payer: Medicare Other

## 2014-05-21 ENCOUNTER — Ambulatory Visit: Payer: Medicare Other

## 2014-05-28 ENCOUNTER — Other Ambulatory Visit (HOSPITAL_COMMUNITY)
Admission: RE | Admit: 2014-05-28 | Discharge: 2014-05-28 | Disposition: A | Discharge status: Home / Self Care | Payer: Medicare Other | Source: Ambulatory Visit | Attending: Infectious Diseases | Admitting: Infectious Diseases

## 2014-05-28 ENCOUNTER — Ambulatory Visit (INDEPENDENT_AMBULATORY_CARE_PROVIDER_SITE_OTHER): Payer: Medicare Other | Admitting: *Deleted

## 2014-05-28 DIAGNOSIS — Z113 Encounter for screening for infections with a predominantly sexual mode of transmission: Secondary | ICD-10-CM | POA: Diagnosis not present

## 2014-05-28 DIAGNOSIS — Z23 Encounter for immunization: Secondary | ICD-10-CM

## 2014-05-28 DIAGNOSIS — N760 Acute vaginitis: Secondary | ICD-10-CM | POA: Insufficient documentation

## 2014-05-28 DIAGNOSIS — Z124 Encounter for screening for malignant neoplasm of cervix: Secondary | ICD-10-CM | POA: Diagnosis not present

## 2014-05-28 NOTE — Progress Notes (Signed)
  Subjective:     Danielle [name] is a 43 y.o. woman who comes in today for a  pap smear only.  Previous abnormal Pap smears: no. Contraception: condoms.  No family hx of cervical or uterine CA.  Objective:    There were no vitals taken for this visit.  LMP = 05/16/14 Pelvic Exam:  Pap smear obtained.   Assessment:    Screening pap smear.   Plan:    Follow up in one year, or as indicated by Pap results.  Pt given educational materials re: HIV and women, self-esteem, BSE, nutrition and diet management, PAP smears and partner safety. Pt given condoms.

## 2014-05-28 NOTE — Patient Instructions (Signed)
Your results will be ready in about a week.  I will mail them to you.  Thank you for coming to the Center for your care.  Denise,  RN 

## 2014-06-01 LAB — CYTOLOGY - PAP

## 2014-06-01 LAB — CERVICOVAGINAL ANCILLARY ONLY: Candida vaginitis: NEGATIVE

## 2014-06-02 ENCOUNTER — Telehealth: Payer: Self-pay | Admitting: *Deleted

## 2014-06-02 DIAGNOSIS — A599 Trichomoniasis, unspecified: Secondary | ICD-10-CM

## 2014-06-02 MED ORDER — METRONIDAZOLE 500 MG PO TABS: 2000.0000 mg | ORAL_TABLET | Freq: Once | ORAL | Status: DC

## 2014-06-02 NOTE — Telephone Encounter (Signed)
PAP smear resutls negative.  Pt needing treatment for trichomonas.  Advised that husband obtain treatment so that the infection is not passes back and forth.  Pt verbalized understanding and stated that she would speak with there husband.

## 2014-06-02 NOTE — Telephone Encounter (Signed)
Message copied by Ileana Roup on Wed Jun 02, 2014  3:27 PM ------      Message from: HATCHER, JEFFREY C      Created: Wed Jun 02, 2014  3:16 PM      Regarding: RE: Trichomonas infection on PAP smear       You're welcome!            tx for trich would be flagyl 2g po x1      Thanks            ----- Message -----         From: Lorne Skeens, RN         Sent: 06/02/2014   2:26 PM           To: Campbell Riches, MD      Subject: Trichomonas infection on PAP smear                       Treatment?  Who? What? Where? Why? And How?              Thanks for the ice cream!!            Thank you.            Langley Gauss       ------

## 2014-06-04 ENCOUNTER — Encounter: Payer: Self-pay | Admitting: Internal Medicine

## 2014-06-09 ENCOUNTER — Other Ambulatory Visit: Payer: Self-pay | Admitting: Infectious Diseases

## 2014-06-10 ENCOUNTER — Ambulatory Visit: Payer: Medicare Other

## 2014-06-11 NOTE — Telephone Encounter (Signed)
Oxybutynin should be given by PCP or previous rx'ing MD.  I do not think I prescribed that med for her

## 2014-06-17 DIAGNOSIS — F319 Bipolar disorder, unspecified: Secondary | ICD-10-CM | POA: Diagnosis not present

## 2014-06-22 ENCOUNTER — Ambulatory Visit: Payer: Medicare Other

## 2014-06-22 ENCOUNTER — Other Ambulatory Visit: Payer: Medicare Other

## 2014-06-22 DIAGNOSIS — F310 Bipolar disorder, current episode hypomanic: Secondary | ICD-10-CM | POA: Diagnosis not present

## 2014-06-22 DIAGNOSIS — B20 Human immunodeficiency virus [HIV] disease: Secondary | ICD-10-CM

## 2014-06-22 LAB — COMPREHENSIVE METABOLIC PANEL
ALBUMIN: 3.9 g/dL (ref 3.5–5.2)
ALT: 14 U/L (ref 0–35)
AST: 19 U/L (ref 0–37)
Alkaline Phosphatase: 80 U/L (ref 39–117)
BUN: 13 mg/dL (ref 6–23)
CALCIUM: 9.2 mg/dL (ref 8.4–10.5)
CHLORIDE: 108 meq/L (ref 96–112)
CO2: 24 meq/L (ref 19–32)
Creat: 1.2 mg/dL — ABNORMAL HIGH (ref 0.50–1.10)
GLUCOSE: 85 mg/dL (ref 70–99)
Potassium: 4.2 mEq/L (ref 3.5–5.3)
Sodium: 138 mEq/L (ref 135–145)
Total Bilirubin: 0.4 mg/dL (ref 0.2–1.2)
Total Protein: 6.8 g/dL (ref 6.0–8.3)

## 2014-06-22 LAB — CBC
HEMATOCRIT: 28.7 % — AB (ref 36.0–46.0)
HEMOGLOBIN: 8.4 g/dL — AB (ref 12.0–15.0)
MCH: 20 pg — ABNORMAL LOW (ref 26.0–34.0)
MCHC: 29.3 g/dL — ABNORMAL LOW (ref 30.0–36.0)
MCV: 68.5 fL — ABNORMAL LOW (ref 78.0–100.0)
Platelets: 323 10*3/uL (ref 150–400)
RBC: 4.19 MIL/uL (ref 3.87–5.11)
RDW: 20.3 % — ABNORMAL HIGH (ref 11.5–15.5)
WBC: 4.4 10*3/uL (ref 4.0–10.5)

## 2014-06-23 LAB — T-HELPER CELL (CD4) - (RCID CLINIC ONLY)
CD4 % Helper T Cell: 29 % — ABNORMAL LOW (ref 33–55)
CD4 T Cell Abs: 690 /uL (ref 400–2700)

## 2014-06-23 LAB — HIV-1 RNA QUANT-NO REFLEX-BLD: HIV 1 RNA Quant: <20 copies/mL (ref <20)

## 2014-07-06 ENCOUNTER — Encounter: Payer: Self-pay | Admitting: Infectious Diseases

## 2014-07-06 ENCOUNTER — Ambulatory Visit: Payer: Medicare Other

## 2014-07-06 ENCOUNTER — Ambulatory Visit (INDEPENDENT_AMBULATORY_CARE_PROVIDER_SITE_OTHER): Payer: Medicare Other | Admitting: Infectious Diseases

## 2014-07-06 VITALS — BP 134/79 | HR 72 | Temp 99.1°F | Ht 61.0 in | Wt 159.0 lb

## 2014-07-06 DIAGNOSIS — D500 Iron deficiency anemia secondary to blood loss (chronic): Secondary | ICD-10-CM | POA: Diagnosis not present

## 2014-07-06 DIAGNOSIS — Z113 Encounter for screening for infections with a predominantly sexual mode of transmission: Secondary | ICD-10-CM

## 2014-07-06 DIAGNOSIS — K219 Gastro-esophageal reflux disease without esophagitis: Secondary | ICD-10-CM | POA: Diagnosis not present

## 2014-07-06 DIAGNOSIS — B20 Human immunodeficiency virus [HIV] disease: Secondary | ICD-10-CM | POA: Diagnosis not present

## 2014-07-06 DIAGNOSIS — F310 Bipolar disorder, current episode hypomanic: Secondary | ICD-10-CM | POA: Diagnosis not present

## 2014-07-06 DIAGNOSIS — Z79899 Other long term (current) drug therapy: Secondary | ICD-10-CM

## 2014-07-06 NOTE — Assessment & Plan Note (Addendum)
Suggested she needs to see GYN, she has heavy periods and PICA. May see heme? I asked her to start taking PNV.

## 2014-07-06 NOTE — Assessment & Plan Note (Signed)
She is doing well. She is given condoms. She has had flu shot. Will continue her current meds. See her back in 6 months.

## 2014-07-06 NOTE — Progress Notes (Signed)
   Subjective:    Patient ID: Danielle [name], female    DOB: [birth year], 44 y.o.   MRN: [id number]  HPI 43 yo F with hx of HIV+ and GERD. Taking ATVr/TRV. Prev Cr had increased slightly. Last 1.20.  Had heller myotomy for achalasia on Oct 08, 2013.Has been having pain in her upper abd, worse when she lays down at night. She is going to f/u with her surgeon.  Was seen at [facility] and was told she has Bipolar. She was put on olanzapine but has quit taking (due to sfx). Did not like/connect with her f/u with NP. Was told she schizophrenia due to speaking with her deceased mother. Last PAP 2014/06/09. Would like to stop having menses.   HIV 1 RNA Quant (copies/mL)  Date Value  06/22/2014 <20   12/21/2013 <20   05/25/2013 <20      CD4 T Cell Abs (/uL)  Date Value  06/22/2014 690   12/21/2013 640   05/25/2013 690    Review of Systems  Constitutional: Negative for appetite change and unexpected weight change.  Gastrointestinal: Negative for diarrhea and constipation.  Genitourinary: Positive for menstrual problem. Negative for difficulty urinating.  Psychiatric/Behavioral: Positive for sleep disturbance. Negative for confusion and dysphoric mood.  decreased sleep.      Objective:   Physical Exam  Constitutional: She appears well-developed and well-nourished.  HENT:  Mouth/Throat: No oropharyngeal exudate.  Eyes: EOM are normal. Pupils are equal, round, and reactive to light.  Neck: Neck supple.  Cardiovascular: Normal rate, regular rhythm and normal heart sounds.   Pulmonary/Chest: Effort normal and breath sounds normal.  Abdominal: Soft. Bowel sounds are normal. She exhibits no distension. There is no tenderness.  Lymphadenopathy:    She has no cervical adenopathy.  Psychiatric: Thought content normal. Her mood appears not anxious. Her affect is not angry. Her speech is rapid and/or pressured. She does not exhibit a depressed mood.          Assessment & Plan:

## 2014-07-06 NOTE — Assessment & Plan Note (Signed)
She will f/u with her surgeon

## 2014-07-12 ENCOUNTER — Encounter: Payer: Self-pay | Admitting: Infectious Diseases

## 2014-07-20 ENCOUNTER — Ambulatory Visit: Payer: Medicare Other

## 2014-07-20 DIAGNOSIS — F310 Bipolar disorder, current episode hypomanic: Secondary | ICD-10-CM | POA: Diagnosis not present

## 2014-08-03 ENCOUNTER — Ambulatory Visit: Payer: Medicare Other

## 2014-08-03 DIAGNOSIS — F310 Bipolar disorder, current episode hypomanic: Secondary | ICD-10-CM | POA: Diagnosis not present

## 2014-08-10 ENCOUNTER — Other Ambulatory Visit: Payer: Self-pay | Admitting: Infectious Diseases

## 2014-08-17 ENCOUNTER — Ambulatory Visit: Payer: Medicare Other

## 2014-08-17 DIAGNOSIS — F310 Bipolar disorder, current episode hypomanic: Secondary | ICD-10-CM | POA: Diagnosis not present

## 2014-08-18 ENCOUNTER — Other Ambulatory Visit: Payer: Self-pay | Admitting: Infectious Diseases

## 2014-08-20 DIAGNOSIS — K219 Gastro-esophageal reflux disease without esophagitis: Secondary | ICD-10-CM | POA: Diagnosis not present

## 2014-08-20 DIAGNOSIS — N3281 Overactive bladder: Secondary | ICD-10-CM | POA: Diagnosis not present

## 2014-08-20 DIAGNOSIS — N946 Dysmenorrhea, unspecified: Secondary | ICD-10-CM | POA: Diagnosis not present

## 2014-08-20 DIAGNOSIS — D500 Iron deficiency anemia secondary to blood loss (chronic): Secondary | ICD-10-CM | POA: Diagnosis not present

## 2014-08-30 DIAGNOSIS — F319 Bipolar disorder, unspecified: Secondary | ICD-10-CM | POA: Diagnosis not present

## 2014-08-31 ENCOUNTER — Ambulatory Visit: Payer: Medicare Other

## 2014-08-31 DIAGNOSIS — F310 Bipolar disorder, current episode hypomanic: Secondary | ICD-10-CM | POA: Diagnosis not present

## 2014-09-14 ENCOUNTER — Ambulatory Visit: Payer: Medicare Other

## 2014-09-28 ENCOUNTER — Ambulatory Visit: Payer: Medicare Other

## 2014-10-11 ENCOUNTER — Other Ambulatory Visit: Payer: Self-pay | Admitting: Infectious Diseases

## 2014-10-11 ENCOUNTER — Telehealth: Payer: Self-pay | Admitting: *Deleted

## 2014-10-11 DIAGNOSIS — B20 Human immunodeficiency virus [HIV] disease: Secondary | ICD-10-CM

## 2014-10-11 MED ORDER — DOLUTEGRAVIR SODIUM 50 MG PO TABS: 50.0000 mg | ORAL_TABLET | Freq: Every evening | ORAL | Status: DC

## 2014-10-11 NOTE — Telephone Encounter (Signed)
refilled 

## 2014-10-11 NOTE — Telephone Encounter (Signed)
Patient asking for refill of Tivicay. Last office note has her regimen of ATZr/TRV. Please advise current, correct regimen. Landis Gandy, RN

## 2014-10-12 ENCOUNTER — Ambulatory Visit: Payer: Medicare Other

## 2014-10-12 DIAGNOSIS — F3160 Bipolar disorder, current episode mixed, unspecified: Secondary | ICD-10-CM
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
## 2014-10-15 ENCOUNTER — Other Ambulatory Visit: Payer: Self-pay | Admitting: Infectious Diseases

## 2014-10-19 ENCOUNTER — Ambulatory Visit: Payer: Medicare Other

## 2014-10-19 DIAGNOSIS — F3160 Bipolar disorder, current episode mixed, unspecified: Secondary | ICD-10-CM

## 2014-10-19 NOTE — BH Specialist Note (Signed)
Danielle [name] reported several financial problems she is facing for herself and her family.  She said she just found out that her food stamps are being cut from more than $500 to $175.  Also, she got a letter from Disability saying they overpaid for her daughter and that she has to pay them back $3000.  She said "I'm not paying them back because that's their mistake".  She also talked about finally being diagnosed Bipolar at [facility] and is not taking Abilify 10 mg., though she said it has made her sleepy.  She was hyper-verbal again today, but said she now understands that this is part of her Bipolar symptoms.  Plan to meet again in 2 weeks.  Danielle Spice, LCSW  [place]: [phone]

## 2014-10-26 ENCOUNTER — Ambulatory Visit: Payer: Medicare Other

## 2014-10-26 DIAGNOSIS — F3160 Bipolar disorder, current episode mixed, unspecified: Secondary | ICD-10-CM

## 2014-10-26 NOTE — BH Specialist Note (Signed)
Danielle [name] was hyper-verbal again today, talking about her financial problems due to medical bills and the fact that her husband is not working right now.  She also recently signed up for [facility], which is not paying for her medical  And behavioral health appointments. She said she called and asked to be taken off of it.  I explained that I thought that her Medicaid would pick up the payment for those, but she said it didn't.  She said she had been "down" lately, worrying about this, but today seemed to be in fairly good spirits.  She seems to benefit from coming in and using her sessions to vent about all the stressors in her life.  Plan to meet again in 2 weeks. [name], LCSW  [place]: 21

## 2014-11-05 DIAGNOSIS — M519 Unspecified thoracic, thoracolumbar and lumbosacral intervertebral disc disorder: Secondary | ICD-10-CM | POA: Diagnosis not present

## 2014-11-05 DIAGNOSIS — R229 Localized swelling, mass and lump, unspecified: Secondary | ICD-10-CM | POA: Diagnosis not present

## 2014-11-05 DIAGNOSIS — M545 Low back pain: Secondary | ICD-10-CM | POA: Diagnosis not present

## 2014-11-05 DIAGNOSIS — D649 Anemia, unspecified: Secondary | ICD-10-CM | POA: Diagnosis not present

## 2014-11-09 ENCOUNTER — Ambulatory Visit: Payer: Medicare Other

## 2014-11-09 DIAGNOSIS — F3160 Bipolar disorder, current episode mixed, unspecified: Secondary | ICD-10-CM

## 2014-11-09 NOTE — BH Specialist Note (Signed)
Danielle [name] was in a fairly good mood today, telling a friend on the phone as she was walking to the office that she was going to see her "favorite person - my therapist".  She talked about how she and her family were about to be evicted when her husband got a check and they were able to get caught up on their rent.  She also talked some about her diagnosis of Bipolar Disorder, but seems to be coming to terms with this.  Plan to meet in 2 weeks. [name], LCSW  [place]: [phone]

## 2014-11-12 ENCOUNTER — Other Ambulatory Visit: Payer: Self-pay | Admitting: Infectious Diseases

## 2014-11-12 DIAGNOSIS — B20 Human immunodeficiency virus [HIV] disease: Secondary | ICD-10-CM

## 2014-11-22 DIAGNOSIS — R350 Frequency of micturition: Secondary | ICD-10-CM | POA: Diagnosis not present

## 2014-11-22 DIAGNOSIS — N3941 Urge incontinence: Secondary | ICD-10-CM | POA: Diagnosis not present

## 2014-11-23 ENCOUNTER — Encounter: Payer: Self-pay | Admitting: Obstetrics & Gynecology

## 2014-11-23 ENCOUNTER — Ambulatory Visit: Payer: Medicare Other

## 2014-11-23 DIAGNOSIS — F3160 Bipolar disorder, current episode mixed, unspecified: Secondary | ICD-10-CM

## 2014-11-23 NOTE — BH Specialist Note (Signed)
Briele was in a fairly pleasant mood today, but was overly talkative, as she often is.  She spent much of the session talking about her sister's visit to her home last weekend, which ended up being partly for [place] to fix her hair.  I talked with her about setting limits.  She also shared that her husband hurt his back and found out he has stenosis and a bulging disk.  Plan to meet again in 2 weeks. [name], LCSW  [place]: [phone]

## 2014-12-07 ENCOUNTER — Encounter: Payer: Self-pay | Admitting: *Deleted

## 2014-12-07 ENCOUNTER — Ambulatory Visit: Payer: Medicare Other

## 2014-12-07 DIAGNOSIS — F319 Bipolar disorder, unspecified: Secondary | ICD-10-CM

## 2014-12-07 NOTE — BH Specialist Note (Signed)
Danielle [name] was in a talkative mood today, as she often is, venting about her weekend, dealing with her mother in law.  She said she gets frustrated with her because she gets in their business too much, even though she tells people she does the opposite.  Her mood was good today as she was able to laugh at times about her situation.  She also reported that her husband is about to settle his worker's comp case.  Plan to meet in 2 weeks. [name], LCSW  [place]: 15

## 2014-12-13 ENCOUNTER — Other Ambulatory Visit: Payer: Self-pay | Admitting: Infectious Diseases

## 2014-12-21 ENCOUNTER — Ambulatory Visit: Payer: Medicare Other

## 2014-12-22 ENCOUNTER — Other Ambulatory Visit (HOSPITAL_COMMUNITY)
Admission: RE | Admit: 2014-12-22 | Discharge: 2014-12-22 | Disposition: A | Discharge status: Home / Self Care | Payer: Medicare Other | Source: Ambulatory Visit | Attending: Infectious Diseases | Admitting: Infectious Diseases

## 2014-12-22 ENCOUNTER — Other Ambulatory Visit: Payer: Medicare Other

## 2014-12-22 ENCOUNTER — Ambulatory Visit: Payer: Medicare Other

## 2014-12-22 DIAGNOSIS — Z79899 Other long term (current) drug therapy: Secondary | ICD-10-CM | POA: Diagnosis not present

## 2014-12-22 DIAGNOSIS — Z113 Encounter for screening for infections with a predominantly sexual mode of transmission: Secondary | ICD-10-CM | POA: Insufficient documentation

## 2014-12-22 DIAGNOSIS — F319 Bipolar disorder, unspecified: Secondary | ICD-10-CM

## 2014-12-22 DIAGNOSIS — B20 Human immunodeficiency virus [HIV] disease: Secondary | ICD-10-CM

## 2014-12-22 LAB — COMPREHENSIVE METABOLIC PANEL
ALT: 12 U/L (ref 0–35)
AST: 19 U/L (ref 0–37)
Albumin: 4 g/dL (ref 3.5–5.2)
Alkaline Phosphatase: 84 U/L (ref 39–117)
BUN: 12 mg/dL (ref 6–23)
CALCIUM: 9 mg/dL (ref 8.4–10.5)
CHLORIDE: 108 meq/L (ref 96–112)
CO2: 21 meq/L (ref 19–32)
CREATININE: 1.14 mg/dL — AB (ref 0.50–1.10)
Glucose, Bld: 83 mg/dL (ref 70–99)
Potassium: 4 mEq/L (ref 3.5–5.3)
SODIUM: 139 meq/L (ref 135–145)
Total Bilirubin: 0.3 mg/dL (ref 0.2–1.2)
Total Protein: 7 g/dL (ref 6.0–8.3)

## 2014-12-22 LAB — CBC
HCT: 26.9 % — ABNORMAL LOW (ref 36.0–46.0)
Hemoglobin: 7.8 g/dL — ABNORMAL LOW (ref 12.0–15.0)
MCH: 19.5 pg — AB (ref 26.0–34.0)
MCHC: 29 g/dL — AB (ref 30.0–36.0)
MCV: 67.1 fL — ABNORMAL LOW (ref 78.0–100.0)
MPV: 10.1 fL (ref 8.6–12.4)
PLATELETS: 341 10*3/uL (ref 150–400)
RBC: 4.01 MIL/uL (ref 3.87–5.11)
RDW: 16.8 % — AB (ref 11.5–15.5)
WBC: 5 10*3/uL (ref 4.0–10.5)

## 2014-12-22 LAB — LIPID PANEL
CHOL/HDL RATIO: 3.5 ratio
Cholesterol: 175 mg/dL (ref 0–200)
HDL: 50 mg/dL (ref >=46)
LDL CALC: 107 mg/dL — AB (ref 0–99)
TRIGLYCERIDES: 90 mg/dL (ref <150)
VLDL: 18 mg/dL (ref 0–40)

## 2014-12-22 NOTE — BH Specialist Note (Signed)
Danielle [name] was talkative today, going into detail about how her husband is spending money on his mother's car and how frustrated she gets because of this.  She said that her mother in law doesn't seem to appreciate it, but Danielle [name] has to "bite her tongue" because she doesn't want to start a big argument.  She says she is doing well on her medication.  She continues to enjoy reading as a hobby and goes to [employer] frequently.  Plan to meet again in 2 weeks. [name], LCSW  [place]: [phone]

## 2014-12-23 LAB — URINE CYTOLOGY ANCILLARY ONLY
CHLAMYDIA, DNA PROBE: NEGATIVE
Neisseria Gonorrhea: NEGATIVE

## 2014-12-23 LAB — T-HELPER CELL (CD4) - (RCID CLINIC ONLY)
CD4 T CELL HELPER: 30 % — AB (ref 33–55)
CD4 T Cell Abs: 670 /uL (ref 400–2700)

## 2014-12-23 LAB — HIV-1 RNA QUANT-NO REFLEX-BLD
HIV 1 RNA Quant: <20 copies/mL (ref <20)
HIV-1 RNA Quant, Log: <1.3 {Log} (ref <1.30)

## 2014-12-23 LAB — RPR

## 2014-12-30 ENCOUNTER — Encounter: Payer: Self-pay | Admitting: Obstetrics & Gynecology

## 2014-12-30 ENCOUNTER — Ambulatory Visit (INDEPENDENT_AMBULATORY_CARE_PROVIDER_SITE_OTHER): Payer: Medicare Other | Admitting: Obstetrics & Gynecology

## 2014-12-30 VITALS — BP 137/67 | HR 88 | Ht 60.0 in | Wt 158.3 lb

## 2014-12-30 DIAGNOSIS — Z01812 Encounter for preprocedural laboratory examination: Secondary | ICD-10-CM

## 2014-12-30 DIAGNOSIS — N939 Abnormal uterine and vaginal bleeding, unspecified: Secondary | ICD-10-CM

## 2014-12-30 DIAGNOSIS — Z3202 Encounter for pregnancy test, result negative: Secondary | ICD-10-CM

## 2014-12-30 DIAGNOSIS — D259 Leiomyoma of uterus, unspecified: Secondary | ICD-10-CM

## 2014-12-30 LAB — POCT PREGNANCY, URINE: Preg Test, Ur: NEGATIVE

## 2014-12-30 MED ORDER — MISOPROSTOL 200 MCG PO TABS: ORAL_TABLET | ORAL | Status: DC

## 2014-12-30 MED ORDER — MEGESTROL ACETATE 20 MG PO TABS: 40.0000 mg | ORAL_TABLET | Freq: Every day | ORAL | Status: DC

## 2014-12-30 NOTE — Progress Notes (Signed)
Subjective:     Patient ID: Danielle [name], female   DOB: 01-Nov-1970, 44 y.o.   MRN: [id number]  HPI Pt is 1 44 yo who presents with c/o painful menses and heavy bleeding with passage of clots.  Pt was referred by Dr. Jimmye Norman- Int Med She reports greater than 1 weeks of heavy cycles with bleeding between cycles.  She reports wearing heavy pads that she is changing frequently.  She reports pain that lim its her daily activities.  Pain is not relieved with NSAIDS. But, pt has not been taking them regularly.  Pt reports feeling dizzy with cycles.  No SOB assoc with these sx.  No weight loss.  No fever or chills  ROS: as above   Past Medical History  Diagnosis Date  . HIV infection   . Anemia   . Asthma   . GERD (gastroesophageal reflux disease)   . Chronic back pain   . Depression   . Bulging lumbar disc   . Anxiety   . Migraines   . Pneumonia    Past Surgical History  Procedure Laterality Date  . Hernia repair    . Other surgical history      biopsy axillary ,right arm  . Cesarean section      twice  . Right arm biopsy    . Esophageal manometry N/A 06/29/2013    Procedure: ESOPHAGEAL MANOMETRY (EM);  Surgeon: Jerene Bears, MD;  Location: [facility];  Service: Gastroenterology;  Laterality: N/A;  . Heller myotomy N/A 10/08/2013    Procedure: LAPAROSCOPIC HELLER MYOTOMY, DOR FUNDIPLICATION, UPPER ENDOSCOPY;  Surgeon: Odis Hollingshead, MD;  Location: [facility] ORS;  Service: General;  Laterality: N/A;  . Upper gi endoscopy N/A 10/08/2013    Procedure: UPPER GI ENDOSCOPY;  Surgeon: Odis Hollingshead, MD;  Location: [facility] OR[facility];  Service: General;  Laterality: N/A;  . Colonoscopy N/A 10/12/2013    Procedure: COLONOSCOPY;  Surgeon: Jerene Bears, MD;  Location: [facility];  Service: Endoscopy;  Laterality: N/A;   Current Outpatient Prescriptions on File Prior to Visit  Medication Sig Dispense Refill  . dolutegravir (TIVICAY) 50 MG tablet Take 1 tablet (50 mg total) by mouth every evening. 90  tablet 3  . ranitidine (ZANTAC) 150 MG tablet TAKE 1 TABLET BY MOUTH EVERY NIGHT AT BEDTIME(TK 12 HOURS APART FROM REYETAZ) 30 tablet 0  . TRUVADA 200-300 MG per tablet TAKE 1 TABLET BY MOUTH EVERY DAY 30 tablet 5  . DULoxetine (CYMBALTA) 30 MG capsule Take 30 mg by mouth every morning.     . meloxicam (MOBIC) 15 MG tablet Take 15 mg by mouth daily.    Marland Kitchen oxybutynin (DITROPAN-XL) 10 MG 24 hr tablet Take 10 mg by mouth daily.     . pantoprazole (PROTONIX) 40 MG tablet TAKE 1 TABLET BY MOUTH EVERY DAY (Patient not taking: Reported on 12/30/2014) 90 tablet 0  . simethicone (MYLICON) 80 MG chewable tablet Chew 1 tablet (80 mg total) by mouth 4 (four) times daily. (Patient not taking: Reported on 12/30/2014) 60 tablet 2   No current facility-administered medications on file prior to visit.   Allergies  Allergen Reactions  . Latex Rash   History  Substance Use Topics  . Smoking status: Never Smoker   . Smokeless tobacco: Never Used  . Alcohol Use: No        Review of Systems     Objective:   Physical Exam BP 137/67 mmHg  Pulse 88  Ht 5' (1.524  m)  Wt 158 lb 4.8 oz (71.804 kg)  BMI 30.92 kg/m2  LMP 12/06/2014 Pt in NAD.  Appears uncomfortable prior to exam Lungs: CTA CV:RRR Abd; well healed vertical incision; soft, ND, tender to palpation GU: EGBUS: no lesions Vagina: + blood in vault Cervix: very anterior;  no lesion; no mucopurulent d/c; No CMT Uterus: enlarged, mobile ~16 weeks sized; tender on palpation  Adnexa: no masses; sl tender   The indications for endometrial biopsy were reviewed.   Risks of the biopsy including cramping, bleeding, infection, uterine perforation, inadequate specimen and need for additional procedures  were discussed. The patient states she understands and agrees to undergo procedure today. Consent was signed. Time out was performed. Urine HCG was negative. A sterile speculum was placed in the patient's vagina and the cervix was prepped with Betadine. A  single-toothed tenaculum was placed on the posterior lip of the cervix to stabilize it.  An attempt was made to introduce a 3 mm pipelle into the endometrial cavity without success.  Despite trying to use a graded uterine sound, the Pipelle would not go into the endometrial cavity.  The pt c/o significant discomfort and the procedure was aborted. The instruments were removed from the patient's vagina. Minimal bleeding from the cervix was noted.         Assessment:     Pt with pelvic pain and AUB- thought due to uterine fibroids.  Pt with bleeding between cycles and a endobx is indicated but, was not completed today because of stenosis of the os.  Will attempt again after cytotec.  It is possible that a fibroid is blocking the os so, if the cytotec does not work will need to consider procedure in OR.  Will attempt medical management prior to next visit as well.       Plan:     Routine post-procedure instructions were given to the patient.  The patient will follow up to review the results and for further management.  Pt to f/u in 4 weeks after cytotec to reattempt the procedure  Megace 40mg  po q day

## 2014-12-30 NOTE — Patient Instructions (Addendum)
Uterine Fibroid A uterine fibroid is a growth (tumor) that occurs in your uterus. This type of tumor is not cancerous and does not spread out of the uterus. You can have one or many fibroids. Fibroids can vary in size, weight, and where they grow in the uterus. Some can become quite large. Most fibroids do not require medical treatment, but some can cause pain or heavy bleeding during and between periods. CAUSES  A fibroid is the result of a single uterine cell that keeps growing (unregulated), which is different than most cells in the human body. Most cells have a control mechanism that keeps them from reproducing without control.  SIGNS AND SYMPTOMS   Bleeding.  Pelvic pain and pressure.  Bladder problems due to the size of the fibroid.  Infertility and miscarriages depending on the size and location of the fibroid. DIAGNOSIS  Uterine fibroids are diagnosed through a physical exam. Your health care provider may feel the lumpy tumors during a pelvic exam. Ultrasonography may be done to get information regarding size, location, and number of tumors.  TREATMENT   Your health care provider may recommend watchful waiting. This involves getting the fibroid checked by your health care provider to see if it grows or shrinks.   Hormone treatment or an intrauterine device (IUD) may be prescribed.   Surgery may be needed to remove the fibroids (myomectomy) or the uterus (hysterectomy). This depends on your situation. When fibroids interfere with fertility and a woman wants to become pregnant, a health care provider may recommend having the fibroids removed.  [place] care depends on how you were treated. In general:   Keep all follow-up appointments with your health care provider.   Only take over-the-counter or prescription medicines as directed by your health care provider. If you were prescribed a hormone treatment, take the hormone medicines exactly as directed. Do not  take aspirin. It can cause bleeding.   Talk to your health care provider about taking iron pills.  If your periods are troublesome but not so heavy, lie down with your feet raised slightly above your heart. Place cold packs on your lower abdomen.   If your periods are heavy, write down the number of pads or tampons you use per month. Bring this information to your health care provider.   Include green vegetables in your diet.  SEEK IMMEDIATE MEDICAL CARE IF:  You have pelvic pain or cramps not controlled with medicines.   You have a sudden increase in pelvic pain.   You have an increase in bleeding between and during periods.   You have excessive periods and soak tampons or pads in a half hour or less.  You feel lightheaded or have fainting episodes. Document Released: 08/24/2000 Document Revised: 06/17/2013 Document Reviewed: 03/26/2013 [facility] Patient Information 2015 [place], [place]. This information is not intended to replace advice given to you by your health care provider. Make sure you discuss any questions you have with your health care provider.   Need to take Cytotec 8 hours prior to next visit

## 2015-01-04 ENCOUNTER — Ambulatory Visit: Payer: Medicare Other

## 2015-01-05 ENCOUNTER — Ambulatory Visit: Payer: Medicare Other

## 2015-01-05 ENCOUNTER — Encounter: Payer: Self-pay | Admitting: Infectious Diseases

## 2015-01-05 ENCOUNTER — Ambulatory Visit (INDEPENDENT_AMBULATORY_CARE_PROVIDER_SITE_OTHER): Payer: Medicare Other | Admitting: Infectious Diseases

## 2015-01-05 ENCOUNTER — Ambulatory Visit: Payer: Medicare Other | Admitting: Infectious Diseases

## 2015-01-05 VITALS — BP 121/79 | HR 98 | Temp 99.1°F | Wt 159.0 lb

## 2015-01-05 DIAGNOSIS — R32 Unspecified urinary incontinence: Secondary | ICD-10-CM | POA: Insufficient documentation

## 2015-01-05 DIAGNOSIS — D251 Intramural leiomyoma of uterus: Secondary | ICD-10-CM | POA: Diagnosis not present

## 2015-01-05 DIAGNOSIS — D219 Benign neoplasm of connective and other soft tissue, unspecified: Secondary | ICD-10-CM | POA: Insufficient documentation

## 2015-01-05 DIAGNOSIS — B20 Human immunodeficiency virus [HIV] disease: Secondary | ICD-10-CM | POA: Diagnosis not present

## 2015-01-05 DIAGNOSIS — N939 Abnormal uterine and vaginal bleeding, unspecified: Secondary | ICD-10-CM

## 2015-01-05 DIAGNOSIS — D62 Acute posthemorrhagic anemia: Secondary | ICD-10-CM

## 2015-01-05 DIAGNOSIS — N39498 Other specified urinary incontinence: Secondary | ICD-10-CM | POA: Diagnosis not present

## 2015-01-05 NOTE — Assessment & Plan Note (Signed)
She can f/u with Dr Janice Norrie whom she has seen before.

## 2015-01-05 NOTE — Assessment & Plan Note (Signed)
Cont to f/u with GYN

## 2015-01-05 NOTE — Assessment & Plan Note (Signed)
She will f/u with gyn re: hysterectomy.  Cont megace for now.

## 2015-01-05 NOTE — Assessment & Plan Note (Signed)
Due to her uterine bleeding.

## 2015-01-05 NOTE — Progress Notes (Signed)
   Subjective:    Patient ID: Danielle [name], female    DOB: 10/16/1970, 44 y.o.   MRN: [id number]  HPI 44 yo F with hx of HIV+, abnormal uterine bleeding, and GERD. Taking ATVr/TRV. Prev Cr had increased slightly. Last 1.14.  Her ART was then changed to DTGV/TRV.  Had heller myotomy for achalasia on Oct 08, 2013  Was seen at [facility] and was told she has Bipolar. She was put on olanzapine but has quit taking (due to sfx). Later told she schizophrenia due to speaking with her deceased mother. Has been seen by GYN recently for abnormal bleeding. Prev had u/s showing fibroids (one up to 5 cm). Was started on megace. Hs return appt 01-27-15.   HIV 1 RNA QUANT (copies/mL)  Date Value  12/22/2014 <20  06/22/2014 <20  12/21/2013 <20   CD4 T CELL ABS (/uL)  Date Value  12/21/2014 670  06/22/2014 690  12/21/2013 640     Review of Systems  Constitutional: Negative for appetite change and unexpected weight change.  Gastrointestinal: Negative for diarrhea and constipation.  Genitourinary: Positive for menstrual problem. Negative for difficulty urinating.       Objective:   Physical Exam  Constitutional: She appears well-developed and well-nourished.  HENT:  Mouth/Throat: No oropharyngeal exudate.  Eyes: EOM are normal. Pupils are equal, round, and reactive to light.  Neck: Neck supple.  Cardiovascular: Normal rate, regular rhythm and normal heart sounds.   Pulmonary/Chest: Effort normal and breath sounds normal.  Abdominal: Soft. Bowel sounds are normal. There is no tenderness.  Lymphadenopathy:    She has no cervical adenopathy.      Assessment & Plan:

## 2015-01-05 NOTE — Assessment & Plan Note (Signed)
She is doing well on her ART.  No change for now, possible change to TAF/3TC/DTGV Offered refused condoms (husband won't go to Dr). I offered PReP trial.  rtrc 6 months.

## 2015-01-06 ENCOUNTER — Ambulatory Visit: Payer: Medicare Other

## 2015-01-06 DIAGNOSIS — F319 Bipolar disorder, unspecified: Secondary | ICD-10-CM

## 2015-01-06 NOTE — BH Specialist Note (Signed)
Seaira talked about the stressors of her husband's health issues and also some changes that may be taking place with his "settlement" with regard to a worker's comp case.  She then talked about some of her own medical issues, most of which have been resolved.  Her mood was pretty good today and she was able to joke about my getting to the lobby a minute late.  Plan to meet in 2 weeks. [name], LCSW  [place]: [phone]

## 2015-01-19 ENCOUNTER — Ambulatory Visit: Payer: Medicare Other

## 2015-01-19 DIAGNOSIS — F319 Bipolar disorder, unspecified: Secondary | ICD-10-CM

## 2015-01-19 NOTE — BH Specialist Note (Signed)
Kerri talked about several issues today, including how her brother tries to get her involved in his [place], but doesn't want to pay her for helping him out.  I praised her for setting limits with him re: this.  As she was talking about it, she mentioned that she has a bachelor's degree in law, which is something I did not know about her.  She said it was from an [place].  Then she told a story of how a former therapist called the police on her because of her misunderstanding what she said.  This event really upset her because they never even evaluated her at the facility they took her to.  I validated for her that it sounded like it was completely unnecessary.  Plan to meet in 2 weeks.  Overall, her mood is stable. [name], LCSW  [place]: [phone]

## 2015-01-20 ENCOUNTER — Other Ambulatory Visit: Payer: Self-pay | Admitting: Infectious Diseases

## 2015-01-27 ENCOUNTER — Ambulatory Visit: Payer: Medicare Other | Admitting: Obstetrics & Gynecology

## 2015-02-02 ENCOUNTER — Ambulatory Visit: Payer: Medicare Other

## 2015-02-02 DIAGNOSIS — F319 Bipolar disorder, unspecified: Secondary | ICD-10-CM

## 2015-02-02 NOTE — BH Specialist Note (Signed)
Tijah used her session today to talk about a young female friend who asked to have her hair braided by [place], but brought her toddler with her, knowing she would be there most of the day.  She talked about how frustrating it was to deal with the child and also with the woman, who didn't want to help her.  She said it helps her to come here and talk out her problems, because otherwise she takes it out on her family.  She said her sister told her she talks too much and it hurt her feelings.  Overall, she seems to be stable at this time. [name], LCSW   [place]: [phone]

## 2015-02-16 ENCOUNTER — Ambulatory Visit: Payer: Medicare Other

## 2015-02-16 DIAGNOSIS — F319 Bipolar disorder, unspecified: Secondary | ICD-10-CM

## 2015-02-16 NOTE — BH Specialist Note (Signed)
Danielle [name] spent most of her session today talking about her frustrations with her mother in law, who, according to [place], meddles in her and her husband's business too much.  She said she showed up this morning at 7:30 unannounced, ringing their doorbell and bringing a lawnmower, saying she was going to mow their grass.  At other times, Laury says, she tells them what to do about things and asks about her husband's check, etc.  She said her husband is starting to stand up to her and set limits with her, which Ariely is glad of.  Otherwise, she seems to be stable at this time.  Plan to meet in 2 weeks. [name], LCSW  [place]: [phone]

## 2015-02-20 ENCOUNTER — Other Ambulatory Visit: Payer: Self-pay | Admitting: Infectious Diseases

## 2015-02-23 ENCOUNTER — Encounter: Payer: Self-pay | Admitting: Obstetrics & Gynecology

## 2015-02-23 ENCOUNTER — Encounter (HOSPITAL_COMMUNITY): Payer: Self-pay | Admitting: *Deleted

## 2015-02-23 ENCOUNTER — Ambulatory Visit (INDEPENDENT_AMBULATORY_CARE_PROVIDER_SITE_OTHER): Payer: Medicare Other | Admitting: Obstetrics & Gynecology

## 2015-02-23 ENCOUNTER — Emergency Department (HOSPITAL_COMMUNITY)
Admission: EM | Admit: 2015-02-23 | Discharge: 2015-02-23 | Disposition: A | Discharge status: Home / Self Care | Payer: Medicare Other | Attending: Emergency Medicine | Admitting: Emergency Medicine

## 2015-02-23 VITALS — BP 118/66 | Temp 98.4°F | Wt 157.5 lb

## 2015-02-23 DIAGNOSIS — K219 Gastro-esophageal reflux disease without esophagitis: Secondary | ICD-10-CM | POA: Insufficient documentation

## 2015-02-23 DIAGNOSIS — Z86018 Personal history of other benign neoplasm: Secondary | ICD-10-CM | POA: Insufficient documentation

## 2015-02-23 DIAGNOSIS — B20 Human immunodeficiency virus [HIV] disease: Secondary | ICD-10-CM | POA: Insufficient documentation

## 2015-02-23 DIAGNOSIS — Z91040 Latex allergy status: Secondary | ICD-10-CM | POA: Insufficient documentation

## 2015-02-23 DIAGNOSIS — Z21 Asymptomatic human immunodeficiency virus [HIV] infection status: Secondary | ICD-10-CM

## 2015-02-23 DIAGNOSIS — Z791 Long term (current) use of non-steroidal anti-inflammatories (NSAID): Secondary | ICD-10-CM | POA: Diagnosis not present

## 2015-02-23 DIAGNOSIS — Z79899 Other long term (current) drug therapy: Secondary | ICD-10-CM | POA: Insufficient documentation

## 2015-02-23 DIAGNOSIS — D649 Anemia, unspecified: Secondary | ICD-10-CM | POA: Diagnosis not present

## 2015-02-23 DIAGNOSIS — D259 Leiomyoma of uterus, unspecified: Secondary | ICD-10-CM

## 2015-02-23 DIAGNOSIS — F329 Major depressive disorder, single episode, unspecified: Secondary | ICD-10-CM | POA: Insufficient documentation

## 2015-02-23 DIAGNOSIS — F419 Anxiety disorder, unspecified: Secondary | ICD-10-CM | POA: Insufficient documentation

## 2015-02-23 DIAGNOSIS — Z79818 Long term (current) use of other agents affecting estrogen receptors and estrogen levels: Secondary | ICD-10-CM | POA: Insufficient documentation

## 2015-02-23 DIAGNOSIS — Z3202 Encounter for pregnancy test, result negative: Secondary | ICD-10-CM | POA: Diagnosis not present

## 2015-02-23 DIAGNOSIS — J45909 Unspecified asthma, uncomplicated: Secondary | ICD-10-CM | POA: Insufficient documentation

## 2015-02-23 DIAGNOSIS — T50905A Adverse effect of unspecified drugs, medicaments and biological substances, initial encounter: Secondary | ICD-10-CM

## 2015-02-23 DIAGNOSIS — T471X5A Adverse effect of other antacids and anti-gastric-secretion drugs, initial encounter: Secondary | ICD-10-CM | POA: Insufficient documentation

## 2015-02-23 DIAGNOSIS — R350 Frequency of micturition: Secondary | ICD-10-CM | POA: Diagnosis not present

## 2015-02-23 DIAGNOSIS — Z8701 Personal history of pneumonia (recurrent): Secondary | ICD-10-CM | POA: Insufficient documentation

## 2015-02-23 DIAGNOSIS — Z8739 Personal history of other diseases of the musculoskeletal system and connective tissue: Secondary | ICD-10-CM | POA: Diagnosis not present

## 2015-02-23 DIAGNOSIS — A630 Anogenital (venereal) warts: Secondary | ICD-10-CM | POA: Diagnosis not present

## 2015-02-23 DIAGNOSIS — R1030 Lower abdominal pain, unspecified: Secondary | ICD-10-CM | POA: Insufficient documentation

## 2015-02-23 DIAGNOSIS — G8929 Other chronic pain: Secondary | ICD-10-CM | POA: Insufficient documentation

## 2015-02-23 LAB — CBC WITH DIFFERENTIAL/PLATELET
BASOS ABS: 0.1 10*3/uL (ref 0.0–0.1)
Basophils Relative: 1 % (ref 0–1)
EOS ABS: 0.1 10*3/uL (ref 0.0–0.7)
Eosinophils Relative: 1 % (ref 0–5)
HCT: 25.6 % — ABNORMAL LOW (ref 36.0–46.0)
Hemoglobin: 7.2 g/dL — ABNORMAL LOW (ref 12.0–15.0)
Lymphocytes Relative: 36 % (ref 12–46)
Lymphs Abs: 1.9 10*3/uL (ref 0.7–4.0)
MCH: 18.5 pg — ABNORMAL LOW (ref 26.0–34.0)
MCHC: 28.1 g/dL — AB (ref 30.0–36.0)
MCV: 65.8 fL — ABNORMAL LOW (ref 78.0–100.0)
Monocytes Absolute: 0.5 10*3/uL (ref 0.1–1.0)
Monocytes Relative: 9 % (ref 3–12)
NEUTROS ABS: 2.8 10*3/uL (ref 1.7–7.7)
Neutrophils Relative %: 53 % (ref 43–77)
PLATELETS: 302 10*3/uL (ref 150–400)
RBC: 3.89 MIL/uL (ref 3.87–5.11)
RDW: 17 % — AB (ref 11.5–15.5)
WBC: 5.4 10*3/uL (ref 4.0–10.5)

## 2015-02-23 LAB — URINALYSIS, ROUTINE W REFLEX MICROSCOPIC
Bilirubin Urine: NEGATIVE
Glucose, UA: NEGATIVE mg/dL
Ketones, ur: NEGATIVE mg/dL
Leukocytes, UA: NEGATIVE
Nitrite: NEGATIVE
PROTEIN: NEGATIVE mg/dL
Specific Gravity, Urine: 1.023 (ref 1.005–1.030)
Urobilinogen, UA: 1 mg/dL (ref 0.0–1.0)
pH: 6 (ref 5.0–8.0)

## 2015-02-23 LAB — COMPREHENSIVE METABOLIC PANEL
ALK PHOS: 78 U/L (ref 38–126)
ALT: 15 U/L (ref 14–54)
AST: 21 U/L (ref 15–41)
Albumin: 3.8 g/dL (ref 3.5–5.0)
Anion gap: 6 (ref 5–15)
BILIRUBIN TOTAL: 0.4 mg/dL (ref 0.3–1.2)
BUN: 11 mg/dL (ref 6–20)
CO2: 24 mmol/L (ref 22–32)
CREATININE: 1.07 mg/dL — AB (ref 0.44–1.00)
Calcium: 8.9 mg/dL (ref 8.9–10.3)
Chloride: 107 mmol/L (ref 101–111)
GFR calc Af Amer: >60 mL/min (ref >60)
GFR calc non Af Amer: >60 mL/min (ref >60)
GLUCOSE: 108 mg/dL — AB (ref 65–99)
Potassium: 3.6 mmol/L (ref 3.5–5.1)
Sodium: 137 mmol/L (ref 135–145)
TOTAL PROTEIN: 7.4 g/dL (ref 6.5–8.1)

## 2015-02-23 LAB — URINE MICROSCOPIC-ADD ON

## 2015-02-23 LAB — POCT PREGNANCY, URINE: Preg Test, Ur: NEGATIVE

## 2015-02-23 LAB — PREGNANCY, URINE: Preg Test, Ur: NEGATIVE

## 2015-02-23 MED ORDER — OXYCODONE-ACETAMINOPHEN 5-325 MG PO TABS
1.0000 | ORAL_TABLET | Freq: Once | ORAL | Status: AC
  Administered 2015-02-23: 1 via ORAL
  Filled 2015-02-23: qty 1

## 2015-02-23 MED ORDER — FERROUS SULFATE 325 (65 FE) MG PO TABS: 325.0000 mg | ORAL_TABLET | Freq: Every day | ORAL | Status: DC

## 2015-02-23 NOTE — ED Provider Notes (Signed)
CSN: [id number]     Arrival date & time 02/23/15  7517 History   First MD Initiated Contact with Patient 02/23/15 [phone]     Chief Complaint  Patient presents with  . Medication Reaction     The history is provided by the patient. No language interpreter was used.   Ms. Vanhorne presents for evaluation of lower abdominal pain.  She was prescribed misoprostol to take intravaginally prior to her OB/GYN appointment today. She took the medication last night and shortly thereafter developed lower abdominal cramping and pain. The pain radiates to her back and she has associated urinary frequency. She denies any fevers, vomiting, diarrhea. Symptoms are moderate, constant, worsening. She sees Lavonia Drafts for uterine fibroids. She denies any vaginal bleeding.  Past Medical History  Diagnosis Date  . HIV infection   . Anemia   . Asthma   . GERD (gastroesophageal reflux disease)   . Chronic back pain   . Depression   . Bulging lumbar disc   . Anxiety   . Migraines   . Pneumonia    Past Surgical History  Procedure Laterality Date  . Hernia repair    . Other surgical history      biopsy axillary ,right arm  . Cesarean section      twice  . Right arm biopsy    . Esophageal manometry N/A 06/29/2013    Procedure: ESOPHAGEAL MANOMETRY (EM);  Surgeon: Jerene Bears, MD;  Location: [facility];  Service: Gastroenterology;  Laterality: N/A;  . Heller myotomy N/A 10/08/2013    Procedure: LAPAROSCOPIC HELLER MYOTOMY, DOR FUNDIPLICATION, UPPER ENDOSCOPY;  Surgeon: Odis Hollingshead, MD;  Location: [facility];  Service: General;  Laterality: N/A;  . Upper gi endoscopy N/A 10/08/2013    Procedure: UPPER GI ENDOSCOPY;  Surgeon: Odis Hollingshead, MD;  Location: [facility];  Service: General;  Laterality: N/A;  . Colonoscopy N/A 10/12/2013    Procedure: COLONOSCOPY;  Surgeon: Jerene Bears, MD;  Location: [facility];  Service: Endoscopy;  Laterality: N/A;   Family History  Problem Relation Age of Onset   . Hyperlipidemia Mother   . Diabetes Mother   . Stroke Mother   . Diabetes Brother   . Colon cancer Father   . Esophageal cancer Neg Hx   . Rectal cancer Neg Hx   . Stomach cancer Neg Hx    History  Substance Use Topics  . Smoking status: Never Smoker   . Smokeless tobacco: Never Used  . Alcohol Use: No   OB History    Gravida Para Term Preterm AB TAB SAB Ectopic Multiple Living   5 3 3  2  2   3      Review of Systems  All other systems reviewed and are negative.     Allergies  Latex  Home Medications   Prior to Admission medications   Medication Sig Start Date End Date Taking? Authorizing Provider  dolutegravir (TIVICAY) 50 MG tablet Take 1 tablet (50 mg total) by mouth every evening. 10/11/14   Campbell Riches, MD  DULoxetine (CYMBALTA) 30 MG capsule Take 30 mg by mouth every morning.     Historical Provider, MD  megestrol (MEGACE) 20 MG tablet Take 2 tablets (40 mg total) by mouth daily. 12/30/14   Lavonia Drafts, MD  meloxicam (MOBIC) 15 MG tablet Take 15 mg by mouth daily.    Historical Provider, MD  misoprostol (CYTOTEC) 200 MCG tablet Place two tablets in the vagina the night prior to  your next clinic appointment 12/30/14   Lavonia Drafts, MD  oxybutynin (DITROPAN-XL) 10 MG 24 hr tablet Take 10 mg by mouth daily.  08/08/12   Historical Provider, MD  pantoprazole (PROTONIX) 40 MG tablet TAKE 1 TABLET BY MOUTH EVERY DAY Patient not taking: Reported on 12/30/2014 03/15/14   Campbell Riches, MD  ranitidine (ZANTAC) 150 MG tablet TAKE 1 TABLET EVERY NIGHT AT BEDTIME(12 HRS APART FROM REYETAZ) 02/21/15   Campbell Riches, MD  simethicone (MYLICON) 80 MG chewable tablet Chew 1 tablet (80 mg total) by mouth 4 (four) times daily. Patient not taking: Reported on 12/30/2014 10/13/13   Jackolyn Confer, MD  TRUVADA 200-300 MG per tablet TAKE 1 TABLET BY MOUTH EVERY DAY 11/12/14   Campbell Riches, MD   BP 114/61 mmHg  Pulse 78  Temp(Src) 99.1 F (37.3 C) (Oral)   Resp 18  Ht 5\' 1"  (1.549 m)  Wt 158 lb (71.668 kg)  BMI 29.87 kg/m2  SpO2 100% Physical Exam  Constitutional: She is oriented to person, place, and time. She appears well-developed and well-nourished.  HENT:  Head: Normocephalic and atraumatic.  Cardiovascular: Normal rate and regular rhythm.   No murmur heard. Pulmonary/Chest: Effort normal and breath sounds normal. No respiratory distress.  Abdominal: Soft. There is no rebound and no guarding.  Mild to moderate lower abdominal tenderness without guarding or rebound  Musculoskeletal: She exhibits no edema or tenderness.  Neurological: She is alert and oriented to person, place, and time.  Skin: Skin is warm and dry.  Psychiatric: She has a normal mood and affect. Her behavior is normal.  Nursing note and vitals reviewed.   ED Course  Procedures (including critical care time) Labs Review Labs Reviewed  CBC WITH DIFFERENTIAL/PLATELET - Abnormal; Notable for the following:    Hemoglobin 7.2 (*)    HCT 25.6 (*)    MCV 65.8 (*)    MCH 18.5 (*)    MCHC 28.1 (*)    RDW 17.0 (*)    All other components within normal limits  COMPREHENSIVE METABOLIC PANEL - Abnormal; Notable for the following:    Glucose, Bld 108 (*)    Creatinine, Ser 1.07 (*)    All other components within normal limits  URINALYSIS, ROUTINE W REFLEX MICROSCOPIC (NOT AT [facility]) - Abnormal; Notable for the following:    Hgb urine dipstick MODERATE (*)    All other components within normal limits  URINE MICROSCOPIC-ADD ON  PREGNANCY, URINE    Imaging Review No results found.   EKG Interpretation None      MDM   Final diagnoses:  Medication reaction, initial encounter  Lower abdominal pain  Anemia, unspecified anemia type    Patient here for lower abdominal cramping after starting the Cipro is soft. Patient has a history of anemia and hemoglobin is similar to priors, denies any active bleeding. Discussed with patient and care for abdominal cramping as  well as findings of anemia. Patient is currently not taking her iron supplements, prescription provided. Discussed outpatient follow-up, home care, return precautions.    Quintella Reichert, MD 02/23/15 [phone]

## 2015-02-23 NOTE — ED Notes (Signed)
Pt ambulated to bathroom but with stand by assist and stoppping for a break.

## 2015-02-23 NOTE — ED Notes (Signed)
Pt reports taking misotroprol at 2300 last night.  States that she has an appt at [facility] clinic d/t fibroids.  Pt reports low abd cramping after taking the meds

## 2015-02-23 NOTE — Progress Notes (Signed)
   Subjective:    Patient ID: Danielle [name], female    DOB: [birth year], 44 y.o.   MRN: [id number]  HPI  44 yo MAA HIV+ lady with anemia, fibroids, and DUB. She is here for a second attempt of a EMBX. She took cytotec last night. Her cramping was so bad that she ended up in the ED and getting percocet.  Review of Systems     Objective:   Physical Exam  I attempted a EMBX but the [place] would not pass. (perhaps was hitting a fibroid)      Assessment & Plan:  Anemia, fibroids, dub- continue megace I will email [place] to schedule her for a d&c.

## 2015-02-23 NOTE — Discharge Instructions (Signed)
The misoprostol caused some lower abdominal cramping.  You may take an additional dose of pain medication prior to your doctor's appointment today.  You are anemic today, your hemoglobin was 7.2, this needs to be followed by your doctor.     Abdominal Pain, Women Abdominal (stomach, pelvic, or belly) pain can be caused by many things. It is important to tell your doctor:  The location of the pain.  Does it come and go or is it present all the time?  Are there things that start the pain (eating certain foods, exercise)?  Are there other symptoms associated with the pain (fever, nausea, vomiting, diarrhea)? All of this is helpful to know when trying to find the cause of the pain. CAUSES   Stomach: virus or bacteria infection, or ulcer.  Intestine: appendicitis (inflamed appendix), regional ileitis (Crohn's disease), ulcerative colitis (inflamed colon), irritable bowel syndrome, diverticulitis (inflamed diverticulum of the colon), or cancer of the stomach or intestine.  Gallbladder disease or stones in the gallbladder.  Kidney disease, kidney stones, or infection.  Pancreas infection or cancer.  Fibromyalgia (pain disorder).  Diseases of the female organs:  Uterus: fibroid (non-cancerous) tumors or infection.  Fallopian tubes: infection or tubal pregnancy.  Ovary: cysts or tumors.  Pelvic adhesions (scar tissue).  Endometriosis (uterus lining tissue growing in the pelvis and on the pelvic organs).  Pelvic congestion syndrome (female organs filling up with blood just before the menstrual period).  Pain with the menstrual period.  Pain with ovulation (producing an egg).  Pain with an IUD (intrauterine device, birth control) in the uterus.  Cancer of the female organs.  Functional pain (pain not caused by a disease, may improve without treatment).  Psychological pain.  Depression. DIAGNOSIS  Your doctor will decide the seriousness of your pain by doing an  examination.  Blood tests.  X-rays.  Ultrasound.  CT scan (computed tomography, special type of X-ray).  MRI (magnetic resonance imaging).  Cultures, for infection.  Barium enema (dye inserted in the large intestine, to better view it with X-rays).  Colonoscopy (looking in intestine with a lighted tube).  Laparoscopy (minor surgery, looking in abdomen with a lighted tube).  Major abdominal exploratory surgery (looking in abdomen with a large incision). TREATMENT  The treatment will depend on the cause of the pain.   Many cases can be observed and treated at home.  Over-the-counter medicines recommended by your caregiver.  Prescription medicine.  Antibiotics, for infection.  Birth control pills, for painful periods or for ovulation pain.  Hormone treatment, for endometriosis.  Nerve blocking injections.  Physical therapy.  Antidepressants.  Counseling with a psychologist or psychiatrist.  Minor or major surgery. HOME CARE INSTRUCTIONS   Do not take laxatives, unless directed by your caregiver.  Take over-the-counter pain medicine only if ordered by your caregiver. Do not take aspirin because it can cause an upset stomach or bleeding.  Try a clear liquid diet (broth or water) as ordered by your caregiver. Slowly move to a bland diet, as tolerated, if the pain is related to the stomach or intestine.  Have a thermometer and take your temperature several times a day, and record it.  Bed rest and sleep, if it helps the pain.  Avoid sexual intercourse, if it causes pain.  Avoid stressful situations.  Keep your follow-up appointments and tests, as your caregiver orders.  If the pain does not go away with medicine or surgery, you may try:  Acupuncture.  Relaxation exercises (yoga, meditation).  Group therapy.  Counseling. SEEK MEDICAL CARE IF:   You notice certain foods cause stomach pain.  Your home care treatment is not helping your pain.  You  need stronger pain medicine.  You want your IUD removed.  You feel faint or lightheaded.  You develop nausea and vomiting.  You develop a rash.  You are having side effects or an allergy to your medicine. SEEK IMMEDIATE MEDICAL CARE IF:   Your pain does not go away or gets worse.  You have a fever.  Your pain is felt only in portions of the abdomen. The right side could possibly be appendicitis. The left lower portion of the abdomen could be colitis or diverticulitis.  You are passing blood in your stools (bright red or black tarry stools, with or without vomiting).  You have blood in your urine.  You develop chills, with or without a fever.  You pass out. MAKE SURE YOU:   Understand these instructions.  Will watch your condition.  Will get help right away if you are not doing well or get worse. Document Released: 06/24/2007 Document Revised: 01/11/2014 Document Reviewed: 07/14/2009 [facility] Patient Information 2015 [place], [place]. This information is not intended to replace advice given to you by your health care provider. Make sure you discuss any questions you have with your health care provider.  Anemia, Nonspecific Anemia is a condition in which the concentration of red blood cells or hemoglobin in the blood is below normal. Hemoglobin is a substance in red blood cells that carries oxygen to the tissues of the body. Anemia results in not enough oxygen reaching these tissues.  CAUSES  Common causes of anemia include:   Excessive bleeding. Bleeding may be internal or external. This includes excessive bleeding from periods (in women) or from the intestine.   Poor nutrition.   Chronic kidney, thyroid, and liver disease.  Bone marrow disorders that decrease red blood cell production.  Cancer and treatments for cancer.  HIV, AIDS, and their treatments.  Spleen problems that increase red blood cell destruction.  Blood disorders.  Excess destruction of red  blood cells due to infection, medicines, and autoimmune disorders. SIGNS AND SYMPTOMS   Minor weakness.   Dizziness.   Headache.  Palpitations.   Shortness of breath, especially with exercise.   Paleness.  Cold sensitivity.  Indigestion.  Nausea.  Difficulty sleeping.  Difficulty concentrating. Symptoms may occur suddenly or they may develop slowly.  DIAGNOSIS  Additional blood tests are often needed. These help your health care provider determine the best treatment. Your health care provider will check your stool for blood and look for other causes of blood loss.  TREATMENT  Treatment varies depending on the cause of the anemia. Treatment can include:   Supplements of iron, vitamin N39, or folic acid.   Hormone medicines.   A blood transfusion. This may be needed if blood loss is severe.   Hospitalization. This may be needed if there is significant continual blood loss.   Dietary changes.  Spleen removal. HOME CARE INSTRUCTIONS Keep all follow-up appointments. It often takes many weeks to correct anemia, and having your health care provider check on your condition and your response to treatment is very important. SEEK IMMEDIATE MEDICAL CARE IF:   You develop extreme weakness, shortness of breath, or chest pain.   You become dizzy or have trouble concentrating.  You develop heavy vaginal bleeding.   You develop a rash.   You have bloody or black, tarry stools.  You faint.   You vomit up blood.   You vomit repeatedly.   You have abdominal pain.  You have a fever or persistent symptoms for more than 2-3 days.   You have a fever and your symptoms suddenly get worse.   You are dehydrated.  MAKE SURE YOU:  Understand these instructions.  Will watch your condition.  Will get help right away if you are not doing well or get worse. Document Released: 10/04/2004 Document Revised: 04/29/2013 Document Reviewed: 02/20/2013 [facility]  Patient Information 2015 [place], [place]. This information is not intended to replace advice given to you by your health care provider. Make sure you discuss any questions you have with your health care provider.

## 2015-02-24 ENCOUNTER — Encounter (HOSPITAL_COMMUNITY): Payer: Self-pay | Admitting: *Deleted

## 2015-02-25 ENCOUNTER — Telehealth: Payer: Self-pay | Admitting: *Deleted

## 2015-02-25 NOTE — Telephone Encounter (Signed)
Claudina left a message she had an appointment to see Dr. Tamala Julian but saw Dr. Hulan Fray and they had gave her a medicine. Still having back pain.  Per chart review has DUB, had cytotec night before last office visit and has D&C scheduled in July.  Called Arra and we discussed that she is running to bathroom frequently to urinate , and passing lots of clots . She states she has weak kidneys and isn't getting any rest, and is having a lot of cramping and back pain , headache =20 . We discussed cytotec should be wearing off by now, but she will continue to have bleeding issues until D&C done .  I asked what she is taking for pain and she states she is taking tylenol#3 and it is helping. I instructed her if she is allowed to take ibuprofen to take 600 mg every 6 hours. She states she is taking megace still.  I also instructed her she may come to [facility] for evaluation if she is bleeding heavy or passing lots of clot and having severe pain.  We discussed she could have a uti as well. She voices understanding.

## 2015-03-02 ENCOUNTER — Ambulatory Visit: Payer: Medicare Other

## 2015-03-02 DIAGNOSIS — F319 Bipolar disorder, unspecified: Secondary | ICD-10-CM

## 2015-03-02 NOTE — BH Specialist Note (Signed)
Danielle [name] was talkative again today, but seemed to be in good spirits as she talked about recent stressors, including doctor visits re: fibroids.  She said the doctor gave her medicine to induce contractions so that they could better see her uterus (?), but it did not work and she had a lot of pain from it.  Then she talked about her mother in law, who she says thinks they owe her money when her husband gets a check from [employer]  Plan to meet again in 2 weeks. [name], LCSW

## 2015-03-15 ENCOUNTER — Telehealth: Payer: Self-pay | Admitting: *Deleted

## 2015-03-15 NOTE — Telephone Encounter (Signed)
Pt contacted the clinic requesting a call from a nurse.

## 2015-03-16 ENCOUNTER — Ambulatory Visit: Payer: Medicare Other

## 2015-03-16 DIAGNOSIS — F319 Bipolar disorder, unspecified: Secondary | ICD-10-CM

## 2015-03-16 NOTE — BH Specialist Note (Signed)
Danielle [name] was in a pleasant mood today, spending most of her session talking about her mother in law and expressing her frustrations with her.  She also talked some about her husband and how she feels that he doesn't listen to her sometimes when she talks.  I provided some psycho-education on communication between partners.  Overall, she is stable.  Plan to meet in one week. [name], LCSW

## 2015-03-18 NOTE — Telephone Encounter (Signed)
Called patient back. She states that she has not been able to have endometrial biopsy, she was scheduled for [facility] but cancelled it because she doesn't want that done, she just wants a hysterectomy. Advised patient that I will send message to front desk staff to get her scheduled for visit with surgeon.

## 2015-03-22 ENCOUNTER — Ambulatory Visit: Payer: Medicare Other

## 2015-03-22 DIAGNOSIS — F319 Bipolar disorder, unspecified: Secondary | ICD-10-CM

## 2015-03-22 NOTE — BH Specialist Note (Signed)
Shawndra was in a pleasant mood today and talked about her frustrations with her 42 year old daughter, who is living at home but not working and now wants to go back to [facility].  Tierra says she doesn't know what she wants to study and so [place] doesn't think it's a good idea for her to go to school and spend that money if she doesn't have any direction in her life.  She continues to also express her frustrations with her mother in law, but is able to laugh about it sometimes.  Plan to meet again in 2 weeks. [name], LCSW

## 2015-04-01 ENCOUNTER — Ambulatory Visit: Admit: 2015-04-01 | Payer: Self-pay | Admitting: Obstetrics & Gynecology

## 2015-04-01 SURGERY — DILATION AND CURETTAGE
Anesthesia: Choice | Site: Vagina

## 2015-04-05 ENCOUNTER — Ambulatory Visit: Payer: Medicare Other

## 2015-04-05 DIAGNOSIS — F319 Bipolar disorder, unspecified: Secondary | ICD-10-CM

## 2015-04-05 NOTE — BH Specialist Note (Signed)
Danielle [name] was very talkative today, as she always is, and again was complaining about her mother in law.  She told how her husband had given her $55 to get a tire for her car, but she confessed later that she spent the money on "sweepstakes" - which is basically a form of legal gambling.  He had also given her money from his father's estate, even though she wasn't married to him when he died, and Maggie was very upset with him about giving her money that their family needed.  I validated that it sounds like this woman is taking advantage of her husband.  Overall, her mood seems to be good.  Plan to meet again in 2 weeks. Danielle Spice, LCSW

## 2015-04-19 ENCOUNTER — Ambulatory Visit: Payer: Medicare Other

## 2015-04-19 DIAGNOSIS — F319 Bipolar disorder, unspecified: Secondary | ICD-10-CM

## 2015-04-19 NOTE — BH Specialist Note (Signed)
Danielle [name] was upset today as she talked about some family issues.  She explained that her sister has been trying to get their youngest brother here to [facility] from [place] and has asked [place] to help out with the bus ticket.  He is currently living in a homeless shelter there.  There was a mixup of how the ticket would be paid and [name] ended up paying for it, but the sister has not paid her half yet, which upset [name].  She also talked about her older brother, who wanted to come and stay with her for a few days, along with his young, pregnant girlfriend, but [place] said no to him.  I validated her feelings about these issues and confirmed that she was doing the right thing by setting limits.  Plan to meet in 2 weeks. [name], LCSW

## 2015-04-27 ENCOUNTER — Ambulatory Visit (INDEPENDENT_AMBULATORY_CARE_PROVIDER_SITE_OTHER): Payer: Medicare Other | Admitting: Obstetrics & Gynecology

## 2015-04-27 ENCOUNTER — Encounter: Payer: Self-pay | Admitting: Obstetrics & Gynecology

## 2015-04-27 VITALS — BP 132/74 | HR 73 | Temp 99.0°F | Ht 62.0 in | Wt 158.8 lb

## 2015-04-27 DIAGNOSIS — L292 Pruritus vulvae: Secondary | ICD-10-CM

## 2015-04-27 DIAGNOSIS — N939 Abnormal uterine and vaginal bleeding, unspecified: Secondary | ICD-10-CM

## 2015-04-27 NOTE — Progress Notes (Signed)
Patient ID: Danielle [name], female   DOB: 21-Feb-1971, 44 y.o.   MRN: [id number] History:  43 y.o. [id number] here today for AUB.  She reports daily bleeding.  She has had 2 attempts of ofc endobx that were unsuccessful.  She was scheduled for a D&C but, did not understand the purpose of it and wanted to come back to see me to have an explaination.  The following portions of the patient's history were reviewed and updated as appropriate: allergies, current medications, past family history, past medical history, past social history, past surgical history and problem list.  Review of Systems:  Pertinent items are noted in HPI.  Objective:  Physical Exam Blood pressure 132/74, pulse 73, temperature 99 F (37.2 C), temperature source Oral, height 5\' 2"  (1.575 m), weight 158 lb 12.8 oz (72.031 kg), last menstrual period 04/18/2015. 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  Cx obtained. Bartholin's glands, Skene's glands, urethra and urethral glands are normal.   Assessment & Plan:  Pt with AUB. I have reviewed with her the need to eval the endometrium and r/o pathology. She expressed understanding and wants to proceed with the procedure.  She wants a hyst but, understands the need to sample the endometrium first.  She will have the ablation and resection of the fibroid if present.  She understands that if that doesn't work she may need a hyst.    Patient desires surgical management with hysteroscopy with endometrial and resection of fibroids with myosure.  The risks of surgery were discussed in detail with the patient including but not limited to: bleeding which may require transfusion or reoperation; infection which may require prolonged hospitalization or re-hospitalization and antibiotic therapy; injury to bowel, bladder, ureters and major vessels or other surrounding organs; need for additional procedures including laparotomy; thromboembolic  phenomenon, incisional problems and other postoperative or anesthesia complications.  Patient was told that the likelihood that her condition and symptoms will be treated effectively with this surgical management was very high; the postoperative expectations were also discussed in detail. The patient also understands the alternative treatment options which were discussed in full. All questions were answered.  She was told that she will be contacted by our surgical scheduler regarding the time and date of her surgery; routine preoperative instructions of having nothing to eat or drink after midnight on the day prior to surgery and also coming to the hospital 1 1/2 hours prior to her time of surgery were also emphasized.  She was told she may be called for a preoperative appointment about a week prior to surgery and will be given further preoperative instructions at that visit. Printed patient education handouts about the procedure were given to the patient to review at home.  Larcenia Holaday L. Harraway-Smith, M.D., Cherlynn June

## 2015-04-28 ENCOUNTER — Telehealth (HOSPITAL_COMMUNITY): Payer: Self-pay | Admitting: *Deleted

## 2015-04-28 NOTE — Telephone Encounter (Signed)
Called patient to notify her of surgery date and time (05/03/15 at 12:00pm).  Instructed patient to arrive at 10:30am and nothing to eat or drink after midnight.

## 2015-04-29 LAB — HERPES SIMPLEX VIRUS CULTURE: Organism ID, Bacteria: DETECTED

## 2015-05-03 ENCOUNTER — Ambulatory Visit: Payer: Medicare Other

## 2015-05-03 ENCOUNTER — Encounter (HOSPITAL_COMMUNITY)
Admission: RE | Disposition: A | Discharge status: Home / Self Care | Payer: Self-pay | Source: Ambulatory Visit | Attending: Obstetrics & Gynecology

## 2015-05-03 ENCOUNTER — Ambulatory Visit (HOSPITAL_COMMUNITY): Payer: Medicare Other | Admitting: Anesthesiology

## 2015-05-03 ENCOUNTER — Ambulatory Visit (HOSPITAL_COMMUNITY)
Admission: RE | Admit: 2015-05-03 | Discharge: 2015-05-03 | Disposition: A | Discharge status: Home / Self Care | Payer: Medicare Other | Source: Ambulatory Visit | Attending: Obstetrics & Gynecology | Admitting: Obstetrics & Gynecology

## 2015-05-03 ENCOUNTER — Encounter (HOSPITAL_COMMUNITY): Payer: Self-pay | Admitting: *Deleted

## 2015-05-03 DIAGNOSIS — D259 Leiomyoma of uterus, unspecified: Secondary | ICD-10-CM

## 2015-05-03 DIAGNOSIS — D250 Submucous leiomyoma of uterus: Secondary | ICD-10-CM | POA: Diagnosis not present

## 2015-05-03 DIAGNOSIS — D219 Benign neoplasm of connective and other soft tissue, unspecified: Secondary | ICD-10-CM | POA: Diagnosis present

## 2015-05-03 DIAGNOSIS — N840 Polyp of corpus uteri: Secondary | ICD-10-CM | POA: Diagnosis not present

## 2015-05-03 DIAGNOSIS — N939 Abnormal uterine and vaginal bleeding, unspecified: Secondary | ICD-10-CM | POA: Diagnosis not present

## 2015-05-03 DIAGNOSIS — K219 Gastro-esophageal reflux disease without esophagitis: Secondary | ICD-10-CM | POA: Insufficient documentation

## 2015-05-03 HISTORY — PX: HYSTEROSCOPY WITH RESECTOSCOPE: SHX5395

## 2015-05-03 LAB — CBC
HCT: 32.7 % — ABNORMAL LOW (ref 36.0–46.0)
Hemoglobin: 9.7 g/dL — ABNORMAL LOW (ref 12.0–15.0)
MCH: 21 pg — AB (ref 26.0–34.0)
MCHC: 29.7 g/dL — AB (ref 30.0–36.0)
MCV: 70.6 fL — AB (ref 78.0–100.0)
PLATELETS: 294 10*3/uL (ref 150–400)
RBC: 4.63 MIL/uL (ref 3.87–5.11)
RDW: 22 % — ABNORMAL HIGH (ref 11.5–15.5)
WBC: 5.7 10*3/uL (ref 4.0–10.5)

## 2015-05-03 LAB — PREGNANCY, URINE: Preg Test, Ur: NEGATIVE

## 2015-05-03 SURGERY — HYSTEROSCOPY, USING RESECTOSCOPE
Anesthesia: General | Site: Vagina

## 2015-05-03 MED ORDER — MEPERIDINE HCL 25 MG/ML IJ SOLN: 6.2500 mg | INTRAMUSCULAR | Status: DC | PRN

## 2015-05-03 MED ORDER — LACTATED RINGERS IV SOLN
INTRAVENOUS | Status: DC
  Administered 2015-05-03 (×2): via INTRAVENOUS

## 2015-05-03 MED ORDER — SCOPOLAMINE 1 MG/3DAYS TD PT72
1.0000 | MEDICATED_PATCH | Freq: Once | TRANSDERMAL | Status: DC
  Administered 2015-05-03: 1.5 mg via TRANSDERMAL

## 2015-05-03 MED ORDER — DEXAMETHASONE SODIUM PHOSPHATE 10 MG/ML IJ SOLN
INTRAMUSCULAR | Status: DC | PRN
  Administered 2015-05-03: 4 mg via INTRAVENOUS

## 2015-05-03 MED ORDER — PROPOFOL 10 MG/ML IV BOLUS
INTRAVENOUS | Status: DC | PRN
  Administered 2015-05-03: 150 mg via INTRAVENOUS

## 2015-05-03 MED ORDER — FENTANYL CITRATE (PF) 100 MCG/2ML IJ SOLN
INTRAMUSCULAR | Status: DC | PRN
  Administered 2015-05-03 (×2): 50 ug via INTRAVENOUS

## 2015-05-03 MED ORDER — FENTANYL CITRATE (PF) 100 MCG/2ML IJ SOLN
INTRAMUSCULAR | Status: AC
  Filled 2015-05-03: qty 2

## 2015-05-03 MED ORDER — FENTANYL CITRATE (PF) 100 MCG/2ML IJ SOLN
25.0000 ug | INTRAMUSCULAR | Status: DC | PRN
  Administered 2015-05-03: 50 ug via INTRAVENOUS

## 2015-05-03 MED ORDER — ONDANSETRON HCL 4 MG/2ML IJ SOLN
INTRAMUSCULAR | Status: DC | PRN
  Administered 2015-05-03: 4 mg via INTRAVENOUS

## 2015-05-03 MED ORDER — KETOROLAC TROMETHAMINE 30 MG/ML IJ SOLN
30.0000 mg | Freq: Once | INTRAMUSCULAR | Status: AC | PRN
  Administered 2015-05-03: 30 mg via INTRAVENOUS

## 2015-05-03 MED ORDER — MIDAZOLAM HCL 2 MG/2ML IJ SOLN
INTRAMUSCULAR | Status: AC
  Filled 2015-05-03: qty 4

## 2015-05-03 MED ORDER — PHENYLEPHRINE 40 MCG/ML (10ML) SYRINGE FOR IV PUSH (FOR BLOOD PRESSURE SUPPORT)
PREFILLED_SYRINGE | INTRAVENOUS | Status: AC
  Filled 2015-05-03: qty 10

## 2015-05-03 MED ORDER — IBUPROFEN 600 MG PO TABS: 600.0000 mg | ORAL_TABLET | Freq: Four times a day (QID) | ORAL | Status: DC | PRN

## 2015-05-03 MED ORDER — MIDAZOLAM HCL 2 MG/2ML IJ SOLN
INTRAMUSCULAR | Status: DC | PRN
  Administered 2015-05-03 (×2): 1 mg via INTRAVENOUS

## 2015-05-03 MED ORDER — ONDANSETRON HCL 4 MG/2ML IJ SOLN: 4.0000 mg | Freq: Once | INTRAMUSCULAR | Status: DC | PRN

## 2015-05-03 MED ORDER — BUPIVACAINE HCL (PF) 0.5 % IJ SOLN
INTRAMUSCULAR | Status: AC
  Filled 2015-05-03: qty 30

## 2015-05-03 MED ORDER — FERRIC SUBSULFATE 259 MG/GM EX SOLN
CUTANEOUS | Status: DC | PRN
  Administered 2015-05-03: 1

## 2015-05-03 MED ORDER — EPHEDRINE SULFATE 50 MG/ML IJ SOLN
INTRAMUSCULAR | Status: DC | PRN
  Administered 2015-05-03: 5 mg via INTRAVENOUS

## 2015-05-03 MED ORDER — FERRIC SUBSULFATE 259 MG/GM EX SOLN
CUTANEOUS | Status: AC
  Filled 2015-05-03: qty 8

## 2015-05-03 MED ORDER — BUPIVACAINE HCL (PF) 0.5 % IJ SOLN
INTRAMUSCULAR | Status: DC | PRN
  Administered 2015-05-03: 10 mL

## 2015-05-03 MED ORDER — LIDOCAINE HCL (CARDIAC) 20 MG/ML IV SOLN
INTRAVENOUS | Status: AC
  Filled 2015-05-03: qty 5

## 2015-05-03 MED ORDER — ONDANSETRON HCL 4 MG/2ML IJ SOLN
INTRAMUSCULAR | Status: AC
  Filled 2015-05-03: qty 2

## 2015-05-03 MED ORDER — LACTATED RINGERS IV SOLN: INTRAVENOUS | Status: DC

## 2015-05-03 MED ORDER — LACTATED RINGERS IR SOLN
Status: DC | PRN
  Administered 2015-05-03: 3000 mL

## 2015-05-03 MED ORDER — KETOROLAC TROMETHAMINE 30 MG/ML IJ SOLN
INTRAMUSCULAR | Status: AC
  Filled 2015-05-03: qty 1

## 2015-05-03 MED ORDER — MEGESTROL ACETATE 40 MG PO TABS: 40.0000 mg | ORAL_TABLET | Freq: Two times a day (BID) | ORAL | Status: DC

## 2015-05-03 MED ORDER — PROPOFOL 10 MG/ML IV BOLUS
INTRAVENOUS | Status: AC
  Filled 2015-05-03: qty 20

## 2015-05-03 MED ORDER — VALACYCLOVIR HCL 500 MG PO TABS: 500.0000 mg | ORAL_TABLET | Freq: Every day | ORAL | Status: DC

## 2015-05-03 MED ORDER — LIDOCAINE HCL (CARDIAC) 20 MG/ML IV SOLN
INTRAVENOUS | Status: DC | PRN
  Administered 2015-05-03: 80 mg via INTRAVENOUS

## 2015-05-03 MED ORDER — SCOPOLAMINE 1 MG/3DAYS TD PT72
MEDICATED_PATCH | TRANSDERMAL | Status: AC
  Administered 2015-05-03: 1.5 mg via TRANSDERMAL
  Filled 2015-05-03: qty 1

## 2015-05-03 MED ORDER — DEXAMETHASONE SODIUM PHOSPHATE 4 MG/ML IJ SOLN
INTRAMUSCULAR | Status: AC
  Filled 2015-05-03: qty 1

## 2015-05-03 MED ORDER — FENTANYL CITRATE (PF) 250 MCG/5ML IJ SOLN
INTRAMUSCULAR | Status: AC
  Filled 2015-05-03: qty 25

## 2015-05-03 MED ORDER — GLYCOPYRROLATE 0.2 MG/ML IJ SOLN
INTRAMUSCULAR | Status: DC | PRN
  Administered 2015-05-03 (×2): 0.1 mg via INTRAVENOUS

## 2015-05-03 MED ORDER — PHENYLEPHRINE HCL 10 MG/ML IJ SOLN
INTRAMUSCULAR | Status: DC | PRN
  Administered 2015-05-03: 40 ug via INTRAVENOUS
  Administered 2015-05-03: 60 ug via INTRAVENOUS
  Administered 2015-05-03 (×3): 40 ug via INTRAVENOUS
  Administered 2015-05-03: 60 ug via INTRAVENOUS
  Administered 2015-05-03 (×2): 40 ug via INTRAVENOUS

## 2015-05-03 MED ORDER — GLYCOPYRROLATE 0.2 MG/ML IJ SOLN
INTRAMUSCULAR | Status: AC
  Filled 2015-05-03: qty 1

## 2015-05-03 SURGICAL SUPPLY — 17 items
CATH ROBINSON RED A/P 16FR (CATHETERS) ×3 IMPLANT
CLOTH BEACON ORANGE TIMEOUT ST (SAFETY) ×3 IMPLANT
CONTAINER PREFILL 10% NBF 60ML (FORM) ×2 IMPLANT
GLOVE BIO SURGEON STRL SZ7 (GLOVE) ×3 IMPLANT
GLOVE BIOGEL PI IND STRL 7.0 (GLOVE) ×1 IMPLANT
GLOVE BIOGEL PI INDICATOR 7.0 (GLOVE) ×2
GOWN STRL REUS W/TWL LRG LVL3 (GOWN DISPOSABLE) ×6 IMPLANT
GOWN STRL REUS W/TWL XL LVL3 (GOWN DISPOSABLE) ×3 IMPLANT
LOOP ANGLED CUTTING 22FR (CUTTING LOOP) ×3 IMPLANT
PACK VAGINAL MINOR WOMEN LF (CUSTOM PROCEDURE TRAY) ×3 IMPLANT
PAD OB MATERNITY 4.3X12.25 (PERSONAL CARE ITEMS) ×3 IMPLANT
SCOPETTES 8  STERILE (MISCELLANEOUS) ×2
SCOPETTES 8 STERILE (MISCELLANEOUS) IMPLANT
TOWEL OR 17X24 6PK STRL BLUE (TOWEL DISPOSABLE) ×6 IMPLANT
TUBING AQUILEX INFLOW (TUBING) ×3 IMPLANT
TUBING AQUILEX OUTFLOW (TUBING) ×3 IMPLANT
WATER STERILE IRR 1000ML POUR (IV SOLUTION) ×3 IMPLANT

## 2015-05-03 NOTE — Discharge Instructions (Signed)
DISCHARGE INSTRUCTIONS: HYSTEROSCOPY / ENDOMETRIAL ABLATION °The following instructions have been prepared to help you care for yourself upon your return home. ° °May Remove Scop patch on or before ° °May take Ibuprofen after ° °May take stool softner while taking narcotic pain medication to prevent constipation.  Drink plenty of water. ° °Personal hygiene: °• Use sanitary pads for vaginal drainage, not tampons. °• Shower the day after your procedure. °• NO tub baths, pools or Jacuzzis for 2-3 weeks. °• Wipe front to back after using the bathroom. ° °Activity and limitations: °• Do NOT drive or operate any equipment for 24 hours. The effects of anesthesia are still present °and drowsiness may result. °• Do NOT rest in bed all day. °• Walking is encouraged. °• Walk up and down stairs slowly. °• You may resume your normal activity in one to two days or as indicated by your physician. °Sexual activity: NO intercourse for at least 2 weeks after the procedure, or as indicated by your °Doctor. ° °Diet: Eat a light meal as desired this evening. You may resume your usual diet tomorrow. ° °Return to Work: You may resume your work activities in one to two days or as indicated by your °Doctor. ° °What to expect after your surgery: Expect to have vaginal bleeding/discharge for 2-3 days and °spotting for up to 10 days. It is not unusual to have soreness for up to 1-2 weeks. You may have a °slight burning sensation when you urinate for the first day. Mild cramps may continue for a couple of °days. You may have a regular period in 2-6 weeks. ° °Call your doctor for any of the following: °• Excessive vaginal bleeding or clotting, saturating and changing one pad every hour. °• Inability to urinate 6 hours after discharge from hospital. °• Pain not relieved by pain medication. °• Fever of 100.4° F or greater. °• Unusual vaginal discharge or odor. ° °Return to office _________________Call for an appointment  ___________________ °Patient’s signature: ______________________ °Nurse’s signature ________________________ ° °Post Anesthesia Care Unit [phone] °

## 2015-05-03 NOTE — H&P (Signed)
History:  44 y.o. [id number] here today for AUB. She reports daily bleeding. She has had 2 attempts of ofc endobx that were unsuccessful. She was scheduled for a D&C but, did not understand the purpose of it and wanted to come back to see me to have an explaination.  The following portions of the patient's history were reviewed and updated as appropriate: allergies, current medications, past family history, past medical history, past social history, past surgical history and problem list.  Past Medical History  Diagnosis Date  . HIV infection   . Anemia   . Asthma   . GERD (gastroesophageal reflux disease)   . Chronic back pain   . Depression   . Bulging lumbar disc   . Anxiety   . Migraines   . Pneumonia    Past Surgical History  Procedure Laterality Date  . Hernia repair    . Other surgical history      biopsy axillary ,right arm  . Cesarean section      twice  . Right arm biopsy    . Esophageal manometry N/A 06/29/2013    Procedure: ESOPHAGEAL MANOMETRY (EM);  Surgeon: Jerene Bears, MD;  Location: [facility];  Service: Gastroenterology;  Laterality: N/A;  . Heller myotomy N/A 10/08/2013    Procedure: LAPAROSCOPIC HELLER MYOTOMY, DOR FUNDIPLICATION, UPPER ENDOSCOPY;  Surgeon: Odis Hollingshead, MD;  Location: [facility];  Service: General;  Laterality: N/A;  . Upper gi endoscopy N/A 10/08/2013    Procedure: UPPER GI ENDOSCOPY;  Surgeon: Odis Hollingshead, MD;  Location: [facility];  Service: General;  Laterality: N/A;  . Colonoscopy N/A 10/12/2013    Procedure: COLONOSCOPY;  Surgeon: Jerene Bears, MD;  Location: [facility];  Service: Endoscopy;  Laterality: N/A;   No current facility-administered medications on file prior to encounter.   Current Outpatient Prescriptions on File Prior to Encounter  Medication Sig Dispense Refill  . dolutegravir (TIVICAY) 50 MG tablet Take 1 tablet (50 mg total) by mouth every evening. 90 tablet 3  . megestrol (MEGACE) 20 MG tablet Take 2 tablets  (40 mg total) by mouth daily. 30 tablet 3  . ranitidine (ZANTAC) 150 MG tablet TAKE 1 TABLET EVERY NIGHT AT BEDTIME(12 HRS APART FROM REYETAZ) 30 tablet 3  . TRUVADA 200-300 MG per tablet TAKE 1 TABLET BY MOUTH EVERY DAY 30 tablet 5  . PROAIR HFA 108 (90 BASE) MCG/ACT inhaler Inhale 2 puffs into the lungs every 4 (four) hours as needed for wheezing or shortness of breath. Shortness of breath/wheezing  0   Allergies  Allergen Reactions  . Latex Rash      Review of Systems:  Pertinent items are noted in HPI.  Objective:  Physical Exam Blood pressure 132/74, pulse 73, temperature 99 F (37.2 C), temperature source Oral, height 5\' 2"  (1.575 m), weight 158 lb 12.8 oz (72.031 kg), last menstrual period- currently.   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 Cx obtained. Bartholin's glands, Skene's glands, urethra and urethral glands are normal. EGBUS: no lesions Vagina: + blood in vault Cervix: very anterior; no lesion; no mucopurulent d/c; No CMT Uterus: enlarged, mobile ~16 weeks sized; tender on palpation  Adnexa: no masses; sl tender   10/10/2013 CLINICAL DATA: Underwent Heller myotomy and fundoplication 2 days ago with new onset abdominal distension, HIV positive  EXAM: CT ABDOMEN AND PELVIS WITH CONTRAST  TECHNIQUE: Multidetector CT imaging of the abdomen and pelvis was performed using the  standard protocol following bolus administration of intravenous contrast.  CONTRAST: 74mL OMNIPAQUE IOHEXOL 300 MG/ML SOLN, 177mL OMNIPAQUE IOHEXOL 300 MG/ML SOLN  COMPARISON: 03/18/2009  FINDINGS: Small bilateral pleural effusions with underlying compressive atelectasis.   In the left lobe of the liver there is a 22 x 41 mm low-attenuation oval lesion. This is not seen on the prior study. Gallbladder, spleen, and pancreas are normal. Adrenal glands and kidneys are normal. Aorta is normal. The uterus is  markedly enlarged and heterogeneous.  Bladder is normal except for a small amount of gas within it likely due to recent instrumentation. There is a small to moderate volume of ascites throughout the abdomen and pelvis.  Stomach is distended. Small bowel is diffusely distended with air-fluid levels. Small bowel is distended to about 4 cm. In the right lower quadrant near the terminal ileum, there is a loop of more proximal ileum that is decompressed relative to more proximal bowel. For reasons outlined below, this is felt likely to be due to peristalsis rather than stricture. There is some twisting of the mesentery as seen by a whorling of the mesenteric vessels in the midline on image number 37 through 40. Gas and oral contrast is seen into the cecum, ascending colon, and transverse colon. They are distended as well to a diameter of about 5 cm. Descending colon is decompressed but shows oral contrast within it. At the junction of the proximal and middle thirds of the sigmoid colon there is a 2.5 cm long apple core type lesion causing irregularity and narrowing. There is gas beyond this for several cm, but the distal sigmoid colon and the rectum are decompressed.  There is no significant adenopathy in the abdomen or pelvis. There are no musculoskeletal acute findings.The esophagus is mildly tortuous with surgical clips at the gastroesophageal junction consistent with recent surgical history.  IMPRESSION: 1. The findings likely represent a combination of postoperative ileus and incomplete obstruction. Although there are a few loops of small bowel in the right lower quadrant near the ileocecal valve that are narrow, the point of obstruction given the pattern described above appears most likely to be the 2.5 cm a long infiltrating lesion in the sigmoid colon. This is suspicious for carcinoma. 2. Liver lesion, new from 2010. Its appearance is indeterminate but given the findings  described above the possibility of hepatic metastasis is considered. MRI is suggested to further characterize. 3. Small to moderate volume of ascites 4. Enlarged fibroid uterus I discussed this case by phone with Dr. Zella Richer at 15:00 on 10/10/13.  Assessment & Plan:  Pt with AUB. I have reviewed with her the need to eval the endometrium and r/o pathology. She expressed understanding and wants to proceed with the procedure. She wants a hyst but, understands the need to sample the endometrium first. She will have the ablation and resection of the fibroid if present. She understands that if that doesn't work she may need a hyst.   Patient desires surgical management with hysteroscopy with endometrial and resection of fibroids with myosure. The risks of surgery were discussed in detail with the patient including but not limited to: bleeding which may require transfusion or reoperation; infection which may require prolonged hospitalization or re-hospitalization and antibiotic therapy; injury to bowel, bladder, ureters and major vessels or other surrounding organs; need for additional procedures including laparotomy; thromboembolic phenomenon, incisional problems and other postoperative or anesthesia complications. Patient was told that the likelihood that her condition and symptoms will be treated effectively with  this surgical management was very high; the postoperative expectations were also discussed in detail. The patient also understands the alternative treatment options which were discussed in full. All questions were answered.  Gunnar Hereford L. Harraway-Smith, M.D., Cherlynn June

## 2015-05-03 NOTE — Transfer of Care (Signed)
Immediate Anesthesia Transfer of Care Note  Patient: Danielle [name]  Procedure(s) Performed: Procedure(s) with comments: Diagnostic Hysteroscopy (N/A) - wants Novasure and myosure.  Patient Location: PACU  Anesthesia Type:General  Level of Consciousness: awake, alert , oriented and patient cooperative  Airway & Oxygen Therapy: Patient Spontanous Breathing and Patient connected to nasal cannula oxygen  Post-op Assessment: Report given to RN and Post -op Vital signs reviewed and stable  Post vital signs: Reviewed and stable  Last Vitals:  Filed Vitals:   05/03/15 1107  BP: 115/60  Pulse: 68  Temp: 37.1 C  Resp: 18    Complications: No apparent anesthesia complications

## 2015-05-03 NOTE — Anesthesia Postprocedure Evaluation (Signed)
Anesthesia Post Note  Patient: Danielle [name]  Procedure(s) Performed: Procedure(s) (LRB): Diagnostic Hysteroscopy (N/A)  Anesthesia type: General  Patient location: PACU  Post pain: Pain level controlled  Post assessment: Post-op Vital signs reviewed  Last Vitals:  Filed Vitals:   05/03/15 1330  BP: 113/76  Pulse: 67  Temp:   Resp: 12    Post vital signs: Reviewed  Level of consciousness: sedated  Complications: No apparent anesthesia complications

## 2015-05-03 NOTE — Addendum Note (Signed)
Addendum  created 05/03/15 1348 by Lyn Hollingshead, MD   Modules edited: Orders, PRL Based Order Sets

## 2015-05-03 NOTE — Anesthesia Procedure Notes (Signed)
Procedure Name: LMA Insertion Date/Time: 05/03/2015 11:39 AM Performed by: Georgeanne Nim Pre-anesthesia Checklist: Patient identified, Timeout performed, Emergency Drugs available, Suction available and Patient being monitored Patient Re-evaluated:Patient Re-evaluated prior to inductionOxygen Delivery Method: Circle system utilized Preoxygenation: Pre-oxygenation with 100% oxygen Intubation Type: IV induction LMA: LMA inserted LMA Size: 4.0 Placement Confirmation: positive ETCO2,  CO2 detector and breath sounds checked- equal and bilateral Tube secured with: Tape Dental Injury: Teeth and Oropharynx as per pre-operative assessment

## 2015-05-03 NOTE — Op Note (Signed)
05/03/2015  12:58 PM  PATIENT:  Danielle [name]  44 y.o. female  PRE-OPERATIVE DIAGNOSIS:  abnormal uterine bleeding and fibroids  POST-OPERATIVE DIAGNOSIS:  abnormal uterine bleeding and fibroids  PROCEDURE:  Procedure(s) with comments: Diagnostic Hysteroscopy (N/A) - wants Novasure and myosure.  SURGEON:  Surgeon(s) and Role:    * Lavonia Drafts, MD - Primary  ANESTHESIA:   general and paracervical block  EBL:  Total I/O In: 1200 [I.V.:1200] Out: 5 [Blood:5]  BLOOD ADMINISTERED:none  DRAINS: none   LOCAL MEDICATIONS USED:  MARCAINE     SPECIMEN:  No Specimen  DISPOSITION OF SPECIMEN:  PATHOLOGY  COUNTS:  YES  TOURNIQUET:  * No tourniquets in log *  DICTATION: .Note written in EPIC  PLAN OF CARE: Discharge to home after PACU  PATIENT DISPOSITION:  PACU - hemodynamically stable.   Delay start of Pharmacological VTE agent (>24hrs) due to surgical blood loss or risk of bleeding: not applicable  Complications: none  INDICATIONS: 44 y.o. [id number]  here for scheduled surgery for diagnostic hysteroscopy with endometrial ablation and resection of fibroids.   Risks of surgery were discussed with the patient including but not limited to: perforation, bleeding which may require transfusion; infection which may require antibiotics; injury to uterus or surrounding organs; intrauterine scarring which may impair future fertility; need for additional procedures including laparotomy or laparoscopy; and other postoperative/anesthesia complications. Written informed consent was obtained.    FINDINGS:  A 14 week size uterus.  Diffuse proliferative endometrium with polyps and submucosal.  Normal ostia bilaterally.  PROCEDURE DETAILS:  The patient was taken to the operating room where general anesthesia was administered with LMA and was found to be adequate.  After an adequate timeout was performed, she was placed in the dorsal lithotomy position and examined; then prepped and  draped in the sterile manner.   Her bladder was catheterized for an unmeasured amount of clear, yellow urine. A speculum was then placed in the patient's vagina and a single tooth tenaculum was applied to the anterior lip of the cervix. A paracervical block was performed using a total of 10cc of 0.55 Marcaine at the 4 and 7 o'clock position.  The endocervix was sounded to 8cm and dilated manually with metal dilators to attempt to accommodate the 7 mm operative hysteroscope.  I could not get the cervix dilated enough to accommodate that scope.  I then placed a 5 mm diagnostic scope with the above findings.   Once the cervix was dilated, the hysteroscope was inserted under direct visualization using 1.5% glycine as a suspension medium.  The uterine cavity was carefully examined, both ostia were recognized, and diffusely proliferative endometrium was noted.   After further careful visualization of the uterine cavity, the hysteroscope was removed under direct visualization.  Another attempt was made to accommodate the operative scope without success.  An attempt was made to perform a sharp curettage but the endocervical canal was not easily identified without blockage.  Another attempt was used to view the endometrium with the diagnostic scope but, this was unsuccessful.  After multiple attempts, the tenaculum was removed from the anterior lip of the cervix and the vaginal speculum was removed after noting good hemostasis.  The patient tolerated the procedure well and was taken to the recovery area awake, extubated and in stable condition.  The patient will be discharged to home as per PACU criteria.  Routine postoperative instructions given.  She was prescribed Ibuprofen.

## 2015-05-03 NOTE — Anesthesia Preprocedure Evaluation (Addendum)
Anesthesia Evaluation  Patient identified by MRN, date of birth, ID band Patient awake    Reviewed: Allergy & Precautions, H&P , NPO status , Patient's Chart, lab work & pertinent test results  Airway Mallampati: I  TM Distance: >3 FB Neck ROM: full    Dental no notable dental hx. (+) Teeth Intact   Pulmonary    Pulmonary exam normal       Cardiovascular negative cardio ROS Normal cardiovascular exam    Neuro/Psych    GI/Hepatic Neg liver ROS, GERD-  Controlled,  Endo/Other  negative endocrine ROS  Renal/GU negative Renal ROS     Musculoskeletal   Abdominal Normal abdominal exam  (+)   Peds  Hematology   Anesthesia Other Findings   Reproductive/Obstetrics negative OB ROS                            Anesthesia Physical Anesthesia Plan  ASA: III  Anesthesia Plan: General   Post-op Pain Management:    Induction: Intravenous  Airway Management Planned: LMA  Additional Equipment:   Intra-op Plan:   Post-operative Plan:   Informed Consent: I have reviewed the patients History and Physical, chart, labs and discussed the procedure including the risks, benefits and alternatives for the proposed anesthesia with the patient or authorized representative who has indicated his/her understanding and acceptance.     Plan Discussed with: CRNA  Anesthesia Plan Comments:         Anesthesia Quick Evaluation

## 2015-05-03 NOTE — Brief Op Note (Signed)
05/03/2015  12:58 PM  PATIENT:  Danielle [name]  44 y.o. female  PRE-OPERATIVE DIAGNOSIS:  abnormal uterine bleeding and fibroids  POST-OPERATIVE DIAGNOSIS:  abnormal uterine bleeding and fibroids  PROCEDURE:  Procedure(s) with comments: Diagnostic Hysteroscopy (N/A) - wants Novasure and myosure.  SURGEON:  Surgeon(s) and Role:    * Lavonia Drafts, MD - Primary  ANESTHESIA:   general and paracervical block  EBL:  Total I/O In: 1200 [I.V.:1200] Out: 5 [Blood:5]  BLOOD ADMINISTERED:none  DRAINS: none   LOCAL MEDICATIONS USED:  MARCAINE     SPECIMEN:  No Specimen  DISPOSITION OF SPECIMEN:  PATHOLOGY  COUNTS:  YES  TOURNIQUET:  * No tourniquets in log *  DICTATION: .Note written in EPIC  PLAN OF CARE: Discharge to home after PACU  PATIENT DISPOSITION:  PACU - hemodynamically stable.   Delay start of Pharmacological VTE agent (>24hrs) due to surgical blood loss or risk of bleeding: not applicable  Complications: none

## 2015-05-04 ENCOUNTER — Encounter (HOSPITAL_COMMUNITY): Payer: Self-pay | Admitting: Obstetrics & Gynecology

## 2015-05-04 ENCOUNTER — Telehealth: Payer: Self-pay | Admitting: General Practice

## 2015-05-04 NOTE — Telephone Encounter (Signed)
Per Dr Ihor Dow, patient's HSV test was positive- this was already discussed as a possibility at her office visit. Called patient, no answer- left message stating we are trying to reach you with results, please call us back at the clinics

## 2015-05-04 NOTE — Telephone Encounter (Signed)
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 Pt verbalizes understanding and has no further questions.

## 2015-05-11 ENCOUNTER — Telehealth: Payer: Self-pay | Admitting: *Deleted

## 2015-05-11 NOTE — Telephone Encounter (Signed)
Received message left on nurse line on 05/11/15 at 1416.  Patient states she had surgery with Dr. Ihor Dow on 05/03/15.  States on her discharge instructions there was something about an appointment on 05/17/15.  States she doesn't understand and needs to "know what's going on".  Requests a return call.

## 2015-05-12 NOTE — Telephone Encounter (Signed)
Returned patients call. Advised her that the appointment on the 6th was for [facility] not here with [place].

## 2015-05-17 ENCOUNTER — Ambulatory Visit: Payer: Medicare Other

## 2015-05-17 DIAGNOSIS — F319 Bipolar disorder, unspecified: Secondary | ICD-10-CM
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
## 2015-05-18 ENCOUNTER — Encounter (HOSPITAL_COMMUNITY): Payer: Self-pay | Admitting: *Deleted

## 2015-05-27 ENCOUNTER — Other Ambulatory Visit: Payer: Self-pay | Admitting: Infectious Diseases

## 2015-05-27 DIAGNOSIS — B20 Human immunodeficiency virus [HIV] disease: Secondary | ICD-10-CM

## 2015-06-01 ENCOUNTER — Ambulatory Visit: Payer: Medicare Other

## 2015-06-01 DIAGNOSIS — F319 Bipolar disorder, unspecified: Secondary | ICD-10-CM
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
## 2015-06-15 ENCOUNTER — Ambulatory Visit: Payer: Medicare Other

## 2015-06-15 DIAGNOSIS — F3160 Bipolar disorder, current episode mixed, unspecified: Secondary | ICD-10-CM

## 2015-06-15 NOTE — BH Specialist Note (Signed)
Danielle [name] was talkative today, reporting that she is scheduled later this month for a hysterectomy and has to change her next appointment with me.  She talked about various stressors in her life, including her 42 year old daughter, who is unemployed, as well as her mother in Sports [employer].  Plan to meet again next week. [name], LCSW

## 2015-06-20 ENCOUNTER — Encounter (HOSPITAL_COMMUNITY)
Admission: RE | Admit: 2015-06-20 | Discharge: 2015-06-20 | Disposition: A | Discharge status: Home / Self Care | Payer: Medicare Other | Source: Ambulatory Visit | Attending: Obstetrics & Gynecology | Admitting: Obstetrics & Gynecology

## 2015-06-20 ENCOUNTER — Encounter (HOSPITAL_COMMUNITY): Payer: Self-pay

## 2015-06-20 DIAGNOSIS — Z01818 Encounter for other preprocedural examination: Secondary | ICD-10-CM | POA: Diagnosis not present

## 2015-06-20 DIAGNOSIS — N939 Abnormal uterine and vaginal bleeding, unspecified: Secondary | ICD-10-CM | POA: Insufficient documentation

## 2015-06-20 DIAGNOSIS — D259 Leiomyoma of uterus, unspecified: Secondary | ICD-10-CM | POA: Diagnosis not present

## 2015-06-20 HISTORY — DX: Herpesviral infection of urogenital system, unspecified: A60.00

## 2015-06-20 HISTORY — DX: Inflammatory liver disease, unspecified: K75.9

## 2015-06-20 LAB — CBC
HEMATOCRIT: 33.5 % — AB (ref 36.0–46.0)
HEMOGLOBIN: 9.9 g/dL — AB (ref 12.0–15.0)
MCH: 21.9 pg — ABNORMAL LOW (ref 26.0–34.0)
MCHC: 29.6 g/dL — ABNORMAL LOW (ref 30.0–36.0)
MCV: 74.1 fL — ABNORMAL LOW (ref 78.0–100.0)
Platelets: 257 10*3/uL (ref 150–400)
RBC: 4.52 MIL/uL (ref 3.87–5.11)
RDW: 18 % — ABNORMAL HIGH (ref 11.5–15.5)
WBC: 4.7 10*3/uL (ref 4.0–10.5)

## 2015-06-20 LAB — ABO/RH: ABO/RH(D): O POS

## 2015-06-20 LAB — TYPE AND SCREEN
ABO/RH(D): O POS
ANTIBODY SCREEN: NEGATIVE

## 2015-06-20 NOTE — Patient Instructions (Addendum)
Your procedure is scheduled on:  Tuesday, June 28, 2015  Enter through the Main Entrance of [facility] at:  7:30 a.m.  Pick up the phone at the desk and dial [phone].  Call this number if you have problems the morning of surgery: [phone].  Remember: Do NOT eat food or drink after:  MIDNIGHT MONDAY Take these medicines the morning of surgery with a SIP OF WATER:  ZANTAC  *BRING ASTHMA INHALER DAY OF SURGERY  Do NOT wear jewelry (body piercing), metal hair clips/bobby pins, make-up, or nail polish. Do NOT wear lotions, powders, or perfumes.  You may wear deoderant. Do NOT shave for 48 hours prior to surgery. Do NOT bring valuables to the hospital. Contacts, dentures, or bridgework may not be worn into surgery. Leave suitcase in car.  After surgery it may be brought to your room.  For patients admitted to the hospital, checkout time is 11:00 AM the day of discharge.

## 2015-06-21 ENCOUNTER — Ambulatory Visit: Payer: Medicare Other

## 2015-06-21 DIAGNOSIS — F3160 Bipolar disorder, current episode mixed, unspecified: Secondary | ICD-10-CM

## 2015-06-21 NOTE — BH Specialist Note (Signed)
Danielle [name] talked about a disagreement she had recently with her husband and how he threatened to go drinking.  She said he has been sober for the past year or so and so she told him to go ahead and do that, but then she stopped talking to him.  He never did it and I praised her for the way she handled it, because she never let it show that it bothered her and he saw how she was going to go on with her life if he messed his up.  She also talked some about her upcoming hysterectomy next week.  Plan to meet in 3 weeks. [name], LCSW

## 2015-06-22 ENCOUNTER — Encounter: Payer: Self-pay | Admitting: Obstetrics & Gynecology

## 2015-06-22 ENCOUNTER — Ambulatory Visit (INDEPENDENT_AMBULATORY_CARE_PROVIDER_SITE_OTHER): Payer: Medicare Other | Admitting: Obstetrics & Gynecology

## 2015-06-22 VITALS — BP 137/76 | HR 82 | Temp 99.2°F | Ht 62.0 in | Wt 163.1 lb

## 2015-06-22 DIAGNOSIS — D251 Intramural leiomyoma of uterus: Secondary | ICD-10-CM

## 2015-06-22 DIAGNOSIS — N939 Abnormal uterine and vaginal bleeding, unspecified: Secondary | ICD-10-CM | POA: Diagnosis not present

## 2015-06-22 NOTE — Progress Notes (Signed)
CLINIC ENCOUNTER NOTE  History:  44 y.o. [id number] here today for preoperative visit prior to scheduled TAH/BS on 06/28/15 for fibroids, AUB after failed endometrial biopsy attempts x 2 and failed D&C on 05/03/15. She denies any abnormal vaginal discharge, bleeding, pelvic pain or other concerns.  On Megace.   Past Medical History  Diagnosis Date  . HIV infection ([place])   . Anemia   . Asthma   . GERD (gastroesophageal reflux disease)   . Chronic back pain   . Depression   . Bulging lumbar disc   . Anxiety   . Migraines   . Pneumonia   . Genital HSV   . Hepatitis     Past Surgical History  Procedure Laterality Date  . Hernia repair    . Other surgical history      biopsy axillary ,right arm  . Cesarean section      twice; horizontal and vertical incisions  . Right arm biopsy    . Esophageal manometry N/A 06/29/2013    Procedure: ESOPHAGEAL MANOMETRY (EM);  Surgeon: Jerene Bears, MD;  Location: [facility];  Service: Gastroenterology;  Laterality: N/A;  . Heller myotomy N/A 10/08/2013    Procedure: LAPAROSCOPIC HELLER MYOTOMY, DOR FUNDIPLICATION, UPPER ENDOSCOPY;  Surgeon: Odis Hollingshead, MD;  Location: [facility];  Service: General;  Laterality: N/A;  . Upper gi endoscopy N/A 10/08/2013    Procedure: UPPER GI ENDOSCOPY;  Surgeon: Odis Hollingshead, MD;  Location: [facility];  Service: General;  Laterality: N/A;  . Colonoscopy N/A 10/12/2013    Procedure: COLONOSCOPY;  Surgeon: Jerene Bears, MD;  Location: [facility];  Service: Endoscopy;  Laterality: N/A;  . Hysteroscopy with resectoscope N/A 05/03/2015    Procedure: Diagnostic Hysteroscopy;  Surgeon: Lavonia Drafts, MD;  Location: [facility];  Service: Gynecology;  Laterality: N/A;  wants Novasure and myosure.  . Wisdom tooth extraction      The following portions of the patient's history were reviewed and updated as appropriate: allergies, current medications, past family history, past medical history, past social history,  past surgical history and problem list.   Health Maintenance:  Normal pap on 05/28/14.    Review of Systems:  Pertinent items noted in HPI and remainder of comprehensive ROS otherwise negative.  Objective:  Physical Exam BP 137/76 mmHg  Pulse 82  Temp(Src) 99.2 F (37.3 C) (Oral)  Ht 5\' 2"  (1.575 m)  Wt 163 lb 1.6 oz (73.982 kg)  BMI 29.82 kg/m2  LMP  (LMP Unknown) CONSTITUTIONAL: Well-developed, well-nourished female in no acute distress.  HENT:  Normocephalic, atraumatic. External right and left ear normal. Oropharynx is clear and moist EYES: Conjunctivae and EOM are normal. Pupils are equal, round, and reactive to light. No scleral icterus.  NECK: Normal range of motion, supple, no masses SKIN: Skin is warm and dry. No rash noted. Not diaphoretic. No erythema. No pallor. [place]: Alert and oriented to person, place, and time. Normal reflexes, muscle tone coordination. No cranial nerve deficit noted. PSYCHIATRIC: Normal mood and affect. Normal behavior. Normal judgment and thought content. CARDIOVASCULAR: Normal heart rate noted RESPIRATORY: Effort and breath sounds normal, no problems with respiration noted ABDOMEN: Soft, no distention noted.  Vertical incision noted. 16-18 week sized fibroid uterus palpated PELVIC: Deferred MUSCULOSKELETAL: Normal range of motion. No edema noted.   Assessment & Plan:  Patient will be undergoing a total abdominal hysterectomy (TAH) and prophylactic bilateral salpingectomy.  No indication for oophorectomy.  The risks of surgery were discussed in detail  with the patient including but not limited to: bleeding which may require transfusion or reoperation; infection which may require antibiotics; injury to bowel, bladder, ureters or other surrounding organs; need for additional procedures including laparotomy; thromboembolic phenomenon, incisional problems and other postoperative/anesthesia complications.  Patient was also advised that she will remain  in house for 2 nights; and expected recovery time after a hysterectomy is 6-8 weeks.  Likelihood of success in alleviating the patient's symptoms was discussed.   She was told that she will be contacted by our surgical scheduler regarding the time and date of her surgery; routine preoperative instructions of having nothing to eat or drink after midnight on the day prior to surgery and also coming to the hospital 1.5 hours prior to her time of surgery were also emphasized.  Routine postoperative instructions will be reviewed with the patient and her family in detail after surgery.  In the meantime, she will continue Megace; bleeding precautions were reviewed. Printed patient education handouts about the procedure was given to the patient to review at home.  Return in about 5 weeks (around 07/27/2015) for Postop visit after hysterectomy.   Total face-to-face time with patient: 15 minutes. Over 50% of encounter was spent on counseling and coordination of care.   Verita Schneiders, MD, [place] Attending Obstetrician & Gynecologist, [place] for [facility]

## 2015-06-22 NOTE — Patient Instructions (Signed)
Abdominal Hysterectomy Abdominal hysterectomy is a surgical procedure to remove your womb (uterus). Your uterus is the muscular organ that contains a developing baby. This surgery is done for many reasons. You may need an abdominal hysterectomy if you have cancer, growths (tumors), long-term pain, or bleeding. You may also have this procedure if your uterus has slipped down into your vagina (uterine prolapse). Depending on why you need an abdominal hysterectomy, you may also have other reproductive organs removed. These could include the part of your vagina that connects with your uterus (cervix), the organs that make eggs (ovaries), and the tubes that connect the ovaries to the uterus (fallopian tubes). LET [facility] CARE PROVIDER KNOW ABOUT:   Any allergies you have.  All medicines you are taking, including vitamins, herbs, eye drops, creams, and over-the-counter medicines.  Previous problems you or members of your family have had with the use of anesthetics.  Any blood disorders you have.  Previous surgeries you have had.  Medical conditions you have. RISKS AND COMPLICATIONS Generally, this is a safe procedure. However, as with any procedure, problems can occur. Infection is the most common problem after an abdominal hysterectomy. Other possible problems include:  Bleeding.  Formation of blood clots that may break free and travel to your lungs.  Injury to other organs near your uterus.  Nerve injury causing nerve pain.  Decreased interest in sex or pain during sexual intercourse. BEFORE THE PROCEDURE  Abdominal hysterectomy is a major surgical procedure. It can affect the way you feel about yourself. Talk to your health care provider about the physical and emotional changes hysterectomy may cause.  You may need to have blood work and X-rays done before surgery.  Quit smoking if you smoke. Ask your health care provider for help if you are struggling to quit.  Stop taking  medicines that thin your blood as directed by your health care provider.  You may be instructed to take antibiotic medicines or laxatives before surgery.  Do not eat or drink anything for 6-8 hours before surgery.  Take your regular medicines with a small sip of water.  Bathe or shower the night or morning before surgery. PROCEDURE  Abdominal hysterectomy is done in the operating room at the hospital.  In most cases, you will be given a medicine that makes you go to sleep (general anesthetic).  The surgeon will make a cut (incision) through the skin in your lower belly.  The incision may be about 5-7 inches long. It may go side-to-side or up-and-down.  The surgeon will move aside the body tissue that covers your uterus. The surgeon will then carefully take out your uterus along with any of your other reproductive organs that need to be removed.  Bleeding will be controlled with clamps or sutures.  The surgeon will close your incision with sutures or metal clips. AFTER THE PROCEDURE  You will have some pain immediately after the procedure.  You will be given pain medicine in the recovery room.  You will be taken to your hospital room when you have recovered from the anesthesia.  You may need to stay in the hospital for 2-5 days.  You will be given instructions for recovery at home.   This information is not intended to replace advice given to you by your health care provider. Make sure you discuss any questions you have with your health care provider.   Document Released: 09/01/2013 Document Reviewed: 09/01/2013 Elsevier Interactive Patient Education [employer].

## 2015-06-27 ENCOUNTER — Other Ambulatory Visit: Payer: Self-pay | Admitting: Infectious Diseases

## 2015-06-27 DIAGNOSIS — K219 Gastro-esophageal reflux disease without esophagitis: Secondary | ICD-10-CM

## 2015-06-27 MED ORDER — DEXTROSE 5 % IV SOLN
2.0000 g | INTRAVENOUS | Status: AC
  Administered 2015-06-28: 2 g via INTRAVENOUS
  Filled 2015-06-27: qty 2

## 2015-06-28 ENCOUNTER — Inpatient Hospital Stay (HOSPITAL_COMMUNITY): Payer: Medicare Other | Admitting: Anesthesiology

## 2015-06-28 ENCOUNTER — Encounter (HOSPITAL_COMMUNITY)
Admission: RE | Disposition: A | Discharge status: Home / Self Care | Payer: Self-pay | Source: Ambulatory Visit | Attending: Obstetrics & Gynecology

## 2015-06-28 ENCOUNTER — Encounter (HOSPITAL_COMMUNITY): Payer: Self-pay

## 2015-06-28 ENCOUNTER — Inpatient Hospital Stay (HOSPITAL_COMMUNITY)
Admission: RE | Admit: 2015-06-28 | Discharge: 2015-06-30 | DRG: 743 | Disposition: A | Discharge status: Home / Self Care | Payer: Medicare Other | Source: Ambulatory Visit | Attending: Obstetrics & Gynecology | Admitting: Obstetrics & Gynecology

## 2015-06-28 DIAGNOSIS — Z21 Asymptomatic human immunodeficiency virus [HIV] infection status: Secondary | ICD-10-CM | POA: Diagnosis present

## 2015-06-28 DIAGNOSIS — N939 Abnormal uterine and vaginal bleeding, unspecified: Secondary | ICD-10-CM | POA: Diagnosis present

## 2015-06-28 DIAGNOSIS — N736 Female pelvic peritoneal adhesions (postinfective): Secondary | ICD-10-CM | POA: Diagnosis present

## 2015-06-28 DIAGNOSIS — K219 Gastro-esophageal reflux disease without esophagitis: Secondary | ICD-10-CM | POA: Diagnosis present

## 2015-06-28 DIAGNOSIS — D259 Leiomyoma of uterus, unspecified: Principal | ICD-10-CM | POA: Diagnosis present

## 2015-06-28 DIAGNOSIS — D219 Benign neoplasm of connective and other soft tissue, unspecified: Secondary | ICD-10-CM | POA: Diagnosis present

## 2015-06-28 DIAGNOSIS — Z90710 Acquired absence of both cervix and uterus: Secondary | ICD-10-CM | POA: Diagnosis present

## 2015-06-28 HISTORY — PX: ABDOMINAL HYSTERECTOMY: SHX81

## 2015-06-28 HISTORY — PX: LYSIS OF ADHESION: SHX5961

## 2015-06-28 HISTORY — PX: BILATERAL SALPINGECTOMY: SHX5743

## 2015-06-28 LAB — PREGNANCY, URINE: PREG TEST UR: NEGATIVE

## 2015-06-28 SURGERY — HYSTERECTOMY, ABDOMINAL
Anesthesia: General | Site: Abdomen

## 2015-06-28 MED ORDER — OXYCODONE HCL 5 MG PO TABS: 5.0000 mg | ORAL_TABLET | Freq: Once | ORAL | Status: DC | PRN

## 2015-06-28 MED ORDER — HYDROMORPHONE 1 MG/ML IV SOLN
INTRAVENOUS | Status: DC
  Administered 2015-06-28: 13:00:00 via INTRAVENOUS
  Administered 2015-06-28: 1.2 mg via INTRAVENOUS
  Administered 2015-06-28 – 2015-06-29 (×2): 0.3 mg via INTRAVENOUS
  Filled 2015-06-28: qty 25

## 2015-06-28 MED ORDER — HYDROMORPHONE HCL 1 MG/ML IJ SOLN
INTRAMUSCULAR | Status: AC
  Administered 2015-06-28: 0.25 mg via INTRAVENOUS
  Filled 2015-06-28: qty 1

## 2015-06-28 MED ORDER — ALBUTEROL SULFATE (2.5 MG/3ML) 0.083% IN NEBU: 2.5000 mg | INHALATION_SOLUTION | RESPIRATORY_TRACT | Status: DC | PRN

## 2015-06-28 MED ORDER — FENTANYL CITRATE (PF) 100 MCG/2ML IJ SOLN
INTRAMUSCULAR | Status: AC
  Filled 2015-06-28: qty 4

## 2015-06-28 MED ORDER — OXYCODONE-ACETAMINOPHEN 5-325 MG PO TABS
1.0000 | ORAL_TABLET | ORAL | Status: DC | PRN
  Administered 2015-06-29 – 2015-06-30 (×2): 1 via ORAL
  Filled 2015-06-28 (×2): qty 1

## 2015-06-28 MED ORDER — NALOXONE HCL 0.4 MG/ML IJ SOLN: 0.4000 mg | INTRAMUSCULAR | Status: DC | PRN

## 2015-06-28 MED ORDER — HYDROMORPHONE HCL 1 MG/ML IJ SOLN
0.2500 mg | INTRAMUSCULAR | Status: DC | PRN
  Administered 2015-06-28 (×3): 0.25 mg via INTRAVENOUS

## 2015-06-28 MED ORDER — LIDOCAINE HCL (CARDIAC) 20 MG/ML IV SOLN
INTRAVENOUS | Status: AC
  Filled 2015-06-28: qty 5

## 2015-06-28 MED ORDER — DIPHENHYDRAMINE HCL 12.5 MG/5ML PO ELIX
12.5000 mg | ORAL_SOLUTION | Freq: Four times a day (QID) | ORAL | Status: DC | PRN
Start: 2015-06-28 — End: 2015-06-30

## 2015-06-28 MED ORDER — HYDROMORPHONE HCL 1 MG/ML IJ SOLN
INTRAMUSCULAR | Status: DC | PRN
  Administered 2015-06-28 (×2): 0.5 mg via INTRAVENOUS

## 2015-06-28 MED ORDER — HYDROMORPHONE HCL 1 MG/ML IJ SOLN
INTRAMUSCULAR | Status: AC
  Filled 2015-06-28: qty 1

## 2015-06-28 MED ORDER — FENTANYL CITRATE (PF) 100 MCG/2ML IJ SOLN
INTRAMUSCULAR | Status: DC | PRN
  Administered 2015-06-28 (×2): 100 ug via INTRAVENOUS
  Administered 2015-06-28: 50 ug via INTRAVENOUS
  Administered 2015-06-28: 100 ug via INTRAVENOUS

## 2015-06-28 MED ORDER — LACTATED RINGERS IV SOLN: INTRAVENOUS | Status: DC

## 2015-06-28 MED ORDER — GLYCOPYRROLATE 0.2 MG/ML IJ SOLN
INTRAMUSCULAR | Status: AC
  Filled 2015-06-28: qty 2

## 2015-06-28 MED ORDER — OXYCODONE HCL 5 MG/5ML PO SOLN: 5.0000 mg | Freq: Once | ORAL | Status: DC | PRN

## 2015-06-28 MED ORDER — ROCURONIUM BROMIDE 100 MG/10ML IV SOLN
INTRAVENOUS | Status: DC | PRN
  Administered 2015-06-28: 60 mg via INTRAVENOUS

## 2015-06-28 MED ORDER — SCOPOLAMINE 1 MG/3DAYS TD PT72
1.0000 | MEDICATED_PATCH | Freq: Once | TRANSDERMAL | Status: DC
  Administered 2015-06-28: 1.5 mg via TRANSDERMAL

## 2015-06-28 MED ORDER — MIDAZOLAM HCL 2 MG/2ML IJ SOLN
INTRAMUSCULAR | Status: DC | PRN
  Administered 2015-06-28: 2 mg via INTRAVENOUS

## 2015-06-28 MED ORDER — PROPOFOL 10 MG/ML IV BOLUS
INTRAVENOUS | Status: AC
  Filled 2015-06-28: qty 20

## 2015-06-28 MED ORDER — GLYCOPYRROLATE 0.2 MG/ML IJ SOLN
INTRAMUSCULAR | Status: AC
  Filled 2015-06-28: qty 1

## 2015-06-28 MED ORDER — KETOROLAC TROMETHAMINE 30 MG/ML IJ SOLN
INTRAMUSCULAR | Status: AC
  Filled 2015-06-28: qty 1

## 2015-06-28 MED ORDER — ZOLPIDEM TARTRATE 5 MG PO TABS: 5.0000 mg | ORAL_TABLET | Freq: Every evening | ORAL | Status: DC | PRN

## 2015-06-28 MED ORDER — FENTANYL CITRATE (PF) 250 MCG/5ML IJ SOLN
INTRAMUSCULAR | Status: AC
  Filled 2015-06-28: qty 25

## 2015-06-28 MED ORDER — ROCURONIUM BROMIDE 100 MG/10ML IV SOLN
INTRAVENOUS | Status: AC
  Filled 2015-06-28: qty 1

## 2015-06-28 MED ORDER — EMTRICITABINE-TENOFOVIR AF 200-25 MG PO TABS
1.0000 | ORAL_TABLET | Freq: Every day | ORAL | Status: DC
  Filled 2015-06-28: qty 1

## 2015-06-28 MED ORDER — SENNOSIDES-DOCUSATE SODIUM 8.6-50 MG PO TABS: 1.0000 | ORAL_TABLET | Freq: Every evening | ORAL | Status: DC | PRN

## 2015-06-28 MED ORDER — DIPHENHYDRAMINE HCL 50 MG/ML IJ SOLN: 12.5000 mg | Freq: Four times a day (QID) | INTRAMUSCULAR | Status: DC | PRN

## 2015-06-28 MED ORDER — NEOSTIGMINE METHYLSULFATE 10 MG/10ML IV SOLN
INTRAVENOUS | Status: AC
  Filled 2015-06-28: qty 1

## 2015-06-28 MED ORDER — ONDANSETRON HCL 4 MG/2ML IJ SOLN
INTRAMUSCULAR | Status: AC
  Filled 2015-06-28: qty 2

## 2015-06-28 MED ORDER — NEOSTIGMINE METHYLSULFATE 10 MG/10ML IV SOLN
INTRAVENOUS | Status: DC | PRN
  Administered 2015-06-28: 4 mg via INTRAVENOUS

## 2015-06-28 MED ORDER — VALACYCLOVIR HCL 500 MG PO TABS
500.0000 mg | ORAL_TABLET | Freq: Every day | ORAL | Status: DC
  Administered 2015-06-28 – 2015-06-29 (×2): 500 mg via ORAL
  Filled 2015-06-28 (×4): qty 1

## 2015-06-28 MED ORDER — SIMETHICONE 80 MG PO CHEW: 80.0000 mg | CHEWABLE_TABLET | Freq: Four times a day (QID) | ORAL | Status: DC | PRN

## 2015-06-28 MED ORDER — MENTHOL 3 MG MT LOZG: 1.0000 | LOZENGE | OROMUCOSAL | Status: DC | PRN

## 2015-06-28 MED ORDER — DOCUSATE SODIUM 100 MG PO CAPS
100.0000 mg | ORAL_CAPSULE | Freq: Two times a day (BID) | ORAL | Status: DC
  Administered 2015-06-28 – 2015-06-29 (×4): 100 mg via ORAL
  Filled 2015-06-28 (×4): qty 1

## 2015-06-28 MED ORDER — ONDANSETRON HCL 4 MG/2ML IJ SOLN
INTRAMUSCULAR | Status: DC | PRN
  Administered 2015-06-28: 4 mg via INTRAVENOUS

## 2015-06-28 MED ORDER — PROMETHAZINE HCL 25 MG/ML IJ SOLN: 6.2500 mg | INTRAMUSCULAR | Status: DC | PRN

## 2015-06-28 MED ORDER — ONDANSETRON HCL 4 MG PO TABS: 4.0000 mg | ORAL_TABLET | Freq: Four times a day (QID) | ORAL | Status: DC | PRN

## 2015-06-28 MED ORDER — BUPIVACAINE HCL (PF) 0.5 % IJ SOLN
INTRAMUSCULAR | Status: DC | PRN
  Administered 2015-06-28: 30 mL

## 2015-06-28 MED ORDER — SODIUM CHLORIDE 0.9 % IJ SOLN: 9.0000 mL | INTRAMUSCULAR | Status: DC | PRN

## 2015-06-28 MED ORDER — DOLUTEGRAVIR SODIUM 50 MG PO TABS
50.0000 mg | ORAL_TABLET | Freq: Every evening | ORAL | Status: DC
  Administered 2015-06-28 – 2015-06-29 (×2): 50 mg via ORAL
  Filled 2015-06-28 (×4): qty 1

## 2015-06-28 MED ORDER — HYDROMORPHONE HCL 1 MG/ML IJ SOLN: 0.2000 mg | INTRAMUSCULAR | Status: DC | PRN

## 2015-06-28 MED ORDER — KETOROLAC TROMETHAMINE 30 MG/ML IJ SOLN: 30.0000 mg | Freq: Once | INTRAMUSCULAR | Status: DC

## 2015-06-28 MED ORDER — KETOROLAC TROMETHAMINE 30 MG/ML IJ SOLN
INTRAMUSCULAR | Status: DC | PRN
  Administered 2015-06-28: 30 mg via INTRAVENOUS

## 2015-06-28 MED ORDER — EMTRICITABINE-TENOFOVIR DF 200-300 MG PO TABS
1.0000 | ORAL_TABLET | Freq: Every day | ORAL | Status: DC
  Administered 2015-06-28 – 2015-06-29 (×2): 1 via ORAL
  Filled 2015-06-28 (×4): qty 1

## 2015-06-28 MED ORDER — IBUPROFEN 600 MG PO TABS
600.0000 mg | ORAL_TABLET | Freq: Four times a day (QID) | ORAL | Status: DC | PRN
Start: 2015-06-28 — End: 2015-06-30
  Administered 2015-06-29 – 2015-06-30 (×2): 600 mg via ORAL
  Filled 2015-06-28 (×2): qty 1

## 2015-06-28 MED ORDER — EMTRICITABINE-TENOFOVIR AF 200-25 MG PO TABS
1.0000 | ORAL_TABLET | Freq: Every day | ORAL | Status: DC
  Filled 2015-06-28 (×2): qty 1

## 2015-06-28 MED ORDER — DEXAMETHASONE SODIUM PHOSPHATE 10 MG/ML IJ SOLN
INTRAMUSCULAR | Status: AC
  Filled 2015-06-28: qty 1

## 2015-06-28 MED ORDER — ALUM & MAG HYDROXIDE-SIMETH 200-200-20 MG/5ML PO SUSP: 30.0000 mL | ORAL | Status: DC | PRN

## 2015-06-28 MED ORDER — PANTOPRAZOLE SODIUM 40 MG PO TBEC
40.0000 mg | DELAYED_RELEASE_TABLET | Freq: Every day | ORAL | Status: DC
  Administered 2015-06-28 – 2015-06-29 (×2): 40 mg via ORAL
  Filled 2015-06-28 (×2): qty 1

## 2015-06-28 MED ORDER — MAGNESIUM CITRATE PO SOLN
1.0000 | Freq: Once | ORAL | Status: DC | PRN
  Filled 2015-06-28: qty 296

## 2015-06-28 MED ORDER — MIDAZOLAM HCL 2 MG/2ML IJ SOLN
INTRAMUSCULAR | Status: AC
  Filled 2015-06-28: qty 4

## 2015-06-28 MED ORDER — LACTATED RINGERS IV SOLN
INTRAVENOUS | Status: DC
  Administered 2015-06-28 – 2015-06-29 (×2): via INTRAVENOUS

## 2015-06-28 MED ORDER — BISACODYL 10 MG RE SUPP: 10.0000 mg | Freq: Every day | RECTAL | Status: DC | PRN

## 2015-06-28 MED ORDER — CITRIC ACID-SODIUM CITRATE 334-500 MG/5ML PO SOLN: 30.0000 mL | ORAL | Status: DC

## 2015-06-28 MED ORDER — GLYCOPYRROLATE 0.2 MG/ML IJ SOLN
INTRAMUSCULAR | Status: DC | PRN
Start: 2015-06-28 — End: 2015-06-28
  Administered 2015-06-28: 0.6 mg via INTRAVENOUS

## 2015-06-28 MED ORDER — LACTATED RINGERS IV SOLN
INTRAVENOUS | Status: DC
  Administered 2015-06-28: 125 mL/h via INTRAVENOUS
  Administered 2015-06-28 (×2): via INTRAVENOUS

## 2015-06-28 MED ORDER — BUPIVACAINE HCL (PF) 0.5 % IJ SOLN
INTRAMUSCULAR | Status: AC
  Filled 2015-06-28: qty 30

## 2015-06-28 MED ORDER — PROPOFOL 10 MG/ML IV BOLUS
INTRAVENOUS | Status: DC | PRN
  Administered 2015-06-28: 150 mg via INTRAVENOUS
  Administered 2015-06-28: 50 mg via INTRAVENOUS

## 2015-06-28 MED ORDER — SCOPOLAMINE 1 MG/3DAYS TD PT72
MEDICATED_PATCH | TRANSDERMAL | Status: AC
  Administered 2015-06-28: 1.5 mg via TRANSDERMAL
  Filled 2015-06-28: qty 1

## 2015-06-28 MED ORDER — ONDANSETRON HCL 4 MG/2ML IJ SOLN: 4.0000 mg | Freq: Four times a day (QID) | INTRAMUSCULAR | Status: DC | PRN

## 2015-06-28 MED ORDER — DEXAMETHASONE SODIUM PHOSPHATE 10 MG/ML IJ SOLN
INTRAMUSCULAR | Status: DC | PRN
  Administered 2015-06-28: 10 mg via INTRAVENOUS

## 2015-06-28 SURGICAL SUPPLY — 51 items
APL SKNCLS STERI-STRIP NONHPOA (GAUZE/BANDAGES/DRESSINGS) ×3
BARRIER ADHS 3X4 INTERCEED (GAUZE/BANDAGES/DRESSINGS) IMPLANT
BENZOIN TINCTURE PRP APPL 2/3 (GAUZE/BANDAGES/DRESSINGS) ×2 IMPLANT
BRR ADH 4X3 ABS CNTRL BYND (GAUZE/BANDAGES/DRESSINGS)
CANISTER SUCT 3000ML (MISCELLANEOUS) ×5 IMPLANT
CELLS DAT CNTRL 66122 CELL SVR (MISCELLANEOUS) IMPLANT
CLOSURE WOUND 1/2 X4 (GAUZE/BANDAGES/DRESSINGS) ×1
CLOTH BEACON ORANGE TIMEOUT ST (SAFETY) ×5 IMPLANT
CONT PATH 16OZ SNAP LID 3702 (MISCELLANEOUS) ×5 IMPLANT
DECANTER SPIKE VIAL GLASS SM (MISCELLANEOUS) IMPLANT
DRAPE WARM FLUID 44X44 (DRAPE) IMPLANT
DRSG OPSITE POSTOP 4X10 (GAUZE/BANDAGES/DRESSINGS) ×5 IMPLANT
DURAPREP 26ML APPLICATOR (WOUND CARE) ×5 IMPLANT
ELECT LIGASURE SHORT 9 REUSE (ELECTRODE) IMPLANT
FORMULA NB 0-3 ENFAMIL (FORMULA) ×2 IMPLANT
GAUZE SPONGE 4X4 16PLY XRAY LF (GAUZE/BANDAGES/DRESSINGS) IMPLANT
GLOVE BIOGEL PI IND STRL 7.0 (GLOVE) ×6 IMPLANT
GLOVE BIOGEL PI INDICATOR 7.0 (GLOVE) ×4
GLOVE ECLIPSE 7.0 STRL STRAW (GLOVE) ×5 IMPLANT
GOWN STRL REUS W/TWL LRG LVL3 (GOWN DISPOSABLE) ×10 IMPLANT
NEEDLE HYPO 22GX1.5 SAFETY (NEEDLE) ×5 IMPLANT
NS IRRIG 1000ML POUR BTL (IV SOLUTION) ×7 IMPLANT
PACK ABDOMINAL GYN (CUSTOM PROCEDURE TRAY) ×5 IMPLANT
PAD ABD 8X7 1/2 STERILE (GAUZE/BANDAGES/DRESSINGS) ×2 IMPLANT
PAD OB MATERNITY 4.3X12.25 (PERSONAL CARE ITEMS) ×5 IMPLANT
PENCIL SMOKE EVAC W/HOLSTER (ELECTROSURGICAL) ×5 IMPLANT
PROTECTOR NERVE ULNAR (MISCELLANEOUS) ×5 IMPLANT
RETRACTOR WND ALEXIS 18 MED (MISCELLANEOUS) IMPLANT
RETRACTOR WND ALEXIS 25 LRG (MISCELLANEOUS) IMPLANT
RTRCTR WOUND ALEXIS 18CM MED (MISCELLANEOUS)
RTRCTR WOUND ALEXIS 25CM LRG (MISCELLANEOUS)
SPONGE GAUZE 4X4 12PLY STER LF (GAUZE/BANDAGES/DRESSINGS) ×3 IMPLANT
SPONGE LAP 18X18 X RAY DECT (DISPOSABLE) ×8 IMPLANT
STAPLER VISISTAT 35W (STAPLE) ×2 IMPLANT
STRIP CLOSURE SKIN 1/2X4 (GAUZE/BANDAGES/DRESSINGS) ×4 IMPLANT
SUT PDS AB 0 CTX 60 (SUTURE) ×4 IMPLANT
SUT PLAIN 2 0 (SUTURE)
SUT PLAIN ABS 2-0 CT1 27XMFL (SUTURE) IMPLANT
SUT VIC AB 0 CT1 18XCR BRD8 (SUTURE) ×9 IMPLANT
SUT VIC AB 0 CT1 27 (SUTURE) ×10
SUT VIC AB 0 CT1 27XBRD ANBCTR (SUTURE) ×3 IMPLANT
SUT VIC AB 0 CT1 8-18 (SUTURE) ×10
SUT VIC AB 0 CTX 36 (SUTURE)
SUT VIC AB 0 CTX36XBRD ANBCTRL (SUTURE) IMPLANT
SUT VIC AB 4-0 KS 27 (SUTURE) IMPLANT
SUT VICRYL 0 TIES 12 18 (SUTURE) ×5 IMPLANT
SYR CONTROL 10ML LL (SYRINGE) ×5 IMPLANT
TAPE CLOTH SURG 4X10 WHT LF (GAUZE/BANDAGES/DRESSINGS) ×2 IMPLANT
TOWEL OR 17X24 6PK STRL BLUE (TOWEL DISPOSABLE) ×10 IMPLANT
TRAY FOLEY CATH SILVER 14FR (SET/KITS/TRAYS/PACK) ×5 IMPLANT
WATER STERILE IRR 1000ML POUR (IV SOLUTION) ×3 IMPLANT

## 2015-06-28 NOTE — Anesthesia Postprocedure Evaluation (Signed)
  Anesthesia Post-op Note  Patient: Danielle [name]  Procedure(s) Performed: Procedure(s) (LRB): HYSTERECTOMY ABDOMINAL (N/A) BILATERAL SALPINGECTOMY (Bilateral) LYSIS OF ADHESION  Patient Location: PACU  Anesthesia Type: General  Level of Consciousness: awake and alert   Airway and Oxygen Therapy: Patient Spontanous Breathing  Post-op Pain: mild  Post-op Assessment: Post-op Vital signs reviewed, Patient's Cardiovascular Status Stable, Respiratory Function Stable, Patent Airway and No signs of Nausea or vomiting  Last Vitals:  Filed Vitals:   06/28/15 1200  BP: 124/64  Pulse: 67  Temp:   Resp: 10    Post-op Vital Signs: stable   Complications: No apparent anesthesia complications

## 2015-06-28 NOTE — Transfer of Care (Signed)
Immediate Anesthesia Transfer of Care Note  Patient: Danielle [name]  Procedure(s) Performed: Procedure(s) with comments: HYSTERECTOMY ABDOMINAL (N/A) - Requested 06/28/15 @ 7:30a BILATERAL SALPINGECTOMY (Bilateral) LYSIS OF ADHESION  Patient Location: PACU  Anesthesia Type:General  Level of Consciousness: awake, alert , oriented and patient cooperative  Airway & Oxygen Therapy: Patient Spontanous Breathing and Patient connected to nasal cannula oxygen  Post-op Assessment: Report given to RN, Post -op Vital signs reviewed and stable and Patient moving all extremities X 4  Post vital signs: Reviewed and stable  Last Vitals:  Filed Vitals:   06/28/15 0737  BP: 121/70  Pulse: 87  Temp: 37 C  Resp: 20    Complications: No apparent anesthesia complications

## 2015-06-28 NOTE — Op Note (Signed)
Danielle [name] PROCEDURE DATE: 06/28/2015  PREOPERATIVE DIAGNOSES:  Symptomatic fibroids, abnormal uterine bleeding POSTOPERATIVE DIAGNOSES:  The same SURGEON:   Verita Schneiders, M.D. ASSISTANT: Emeterio Reeve, M.D. OPERATION:  Total Abdominal Hysterectomy, Bilateral Salpingectomy, Lysis of Adhesions via infraumbilical vertical incision ANESTHESIA:  General endotracheal.  INDICATIONS: The patient is a 44 y.o. [id number] with the aforementioned diagnoses who desires definitive surgical management. On the preoperative visit, the risks, benefits, indications, and alternatives of the procedure were reviewed with the patient.  On the day of surgery, the risks of surgery were again discussed with the patient including but not limited to: bleeding which may require transfusion or reoperation; infection which may require antibiotics; injury to bowel, bladder, ureters or other surrounding organs; need for additional procedures; thromboembolic phenomenon, incisional problems and other postoperative/anesthesia complications. Written informed consent was obtained.    OPERATIVE FINDINGS: A 16 week size fibroid uterus with normal tubes and ovaries bilaterally. Dense adhesions of the bladder to anterior uterus, also of the uterus to the left pelvic side wall.  ESTIMATED BLOOD LOSS: 200 ml FLUIDS:  2000 ml of Lactated Ringers URINE OUTPUT:  50 ml of clear yellow urine. SPECIMENS:  Uterus, cervix,  bilateral fallopian tubes sent to pathology COMPLICATIONS:  None immediate.  DESCRIPTION OF PROCEDURE: The patient received intravenous antibiotics and had sequential compression devices applied to her lower extremities while in the preoperative area.   She was taken to the operating room and placed under general anesthesia without difficulty.  The abdomen and perineum were prepped and draped in a sterile manner, and she was placed in a dorsal supine position.  A Foley catheter was inserted into the bladder and attached to  constant drainage. After an adequate timeout was performed, an infraumbilical vertical skin incision was made over her preexisting scar. This incision was taken down to the fascia using electrocautery with care given to maintain good hemostasis. The fascia was incised in the midline and the fascial incision was then extended superiorly and inferiorly using electrocautery without difficulty.  The rectus muscles were split bluntly in the midline and the peritoneum entered sharply without complication. This peritoneal incision was then extended superiorly and inferiorly with care given to prevent bowel or bladder injury. Attention was then turned to the pelvis. The bowel was packed away with moist laparotomy sponges.  The uterus at this point was noted to be mobilized and was delivered up out of the abdomen.   A very dense adhesion was noted on the left side of the uterus and pelvic side wall; this was carefully taken down with sharp and blunt methods with care given to avoid bladder injury.  The bladder adhesions were also taken down bluntly and sharply; the bladder was dissected off the lower uterine segment and cervix.  The bladder was then backfilled with sterile milk, no injuries were noted.    The round ligaments on each side were clamped, suture ligated with 0 Vicryl, and transected with electrocautery allowing entry into the broad ligament.  Of note, all sutures used in this procedure are 0 Vicryl unless otherwise noted.  Adnexae were clamped on the patient's right side, cut, and doubly suture ligated. This procedure was repeated in an identical fashion on the left site allowing for both adnexa to remain in place.  Kelly clamps were placed on the mesosalpinx of the right fallopian tube, and the fallopian tube was excised.  The pedicle was then secured with a free tie.  A similar process was carried out on  the left side, allowing for bilateral salpingectomy.  The uterine arteries were then skeletonized  bilaterally and then clamped, cut, and doubly suture ligated with care given to prevent ureteral injury. The uterosacral ligaments were then clamped, cut, and ligated bilaterally.  Finally, the cardinal ligaments were clamped, cut, and ligated bilaterally.  Acutely curved clamps were placed across the vagina just under the cervix, and the specimen was amputated and sent to pathology. The vaginal cuff angles were closed with Heaney stiches with care given to incorporate the uterosacral-cardinal ligament pedicles on both sides. The middle of the vaginal cuff was closed with a series of interrupted figure-of-eight sutures with care given to incorporate the anterior pubocervical fascia and the posterior rectovaginal fascia.  The pelvis was irrigated and hemostasis was reconfirmed at all pedicles and along the pelvic sidewall.  All laparotomy sponges and instruments were removed from the abdomen. The fascia, rectus muscles and peritoneum were closed in a mass fashion using a looped 0 PDS suture in a running fashion. The skin was closed with staples. Sponge, lap, needle, and instrument counts were correct times two. The patient was taken to the recovery area awake, extubated and in stable condition.    Verita Schneiders, MD, [place] Attending [place], [facility]

## 2015-06-28 NOTE — OR Nursing (Signed)
Bladder was irrigated with 538ml sterile water mixed with enfamil formula 47ml at 1015 per Dr, Harolyn Rutherford

## 2015-06-28 NOTE — Discharge Instructions (Signed)
Abdominal Hysterectomy, Care After °Refer to this sheet in the next few weeks. These instructions provide you with information on caring for yourself after your procedure. Your health care provider may also give you more specific instructions. Your treatment has been planned according to current medical practices, but problems sometimes occur. Call your health care provider if you have any problems or questions after your procedure.  °WHAT TO EXPECT AFTER THE PROCEDURE °After your procedure, it is typical to have the following: °· Pain. °· Feeling tired. °· Poor appetite. °· Less interest in sex. °It takes 4-6 weeks to recover from this surgery.  °HOME CARE INSTRUCTIONS  °· Take pain medicines only as directed by your health care provider. Do not take over-the-counter pain medicines without checking with your health care provider first.  °· Change your bandage as directed by your health care provider. °· Return to your health care provider to have your sutures taken out. °· Take showers instead of baths for 2-3 weeks. Ask your health care provider when it is safe to start showering.  °· Do not douche, use tampons, or have sexual intercourse for at least 6 weeks or until your health care provider says you can.   °· Follow your health care provider's advice about exercise, lifting, driving, and general activities. °· Get plenty of rest and sleep.   °· Do not lift anything heavier than a gallon of milk (about 10 lb [4.5 kg]) for the first month after surgery. °· You can resume your normal diet if your health care provider says it is okay.   °· Do not drink alcohol until your health care provider says you can.   °· If you are constipated, ask your health care provider if you can take a mild laxative. °· Eating foods high in fiber may also help with constipation. Eat plenty of raw fruits and vegetables, whole grains, and beans. °· Drink enough fluids to keep your urine clear or pale yellow.   °· Try to have someone at  home with you for the first 1-2 weeks to help around the house. °· Keep all follow-up appointments. °SEEK MEDICAL CARE IF:  °· You have chills or fever. °· You have swelling, redness, or pain in the area of your incision that is getting worse.   °· You have pus coming from the incision.   °· You notice a bad smell coming from the incision or bandage.   °· Your incision breaks open.   °· You feel dizzy or light-headed.   °· You have pain or bleeding when you urinate.   °· You have persistent diarrhea.   °· You have persistent nausea and vomiting.   °· You have abnormal vaginal discharge.   °· You have a rash.   °· You have any type of abnormal reaction or develop an allergy to your medicine.   °· Your pain medicine is not helping.   °SEEK IMMEDIATE MEDICAL CARE IF:  °· You have a fever and your symptoms suddenly get worse. °· You have severe abdominal pain. °· You have chest pain. °· You have shortness of breath. °· You faint. °· You have pain, swelling, or redness of your leg. °· You have heavy vaginal bleeding with blood clots. °MAKE SURE YOU: °· Understand these instructions. °· Will watch your condition. °· Will get help right away if you are not doing well or get worse. °  °This information is not intended to replace advice given to you by your health care provider. Make sure you discuss any questions you have with your health care provider. °  °Document   Released: 03/16/2005 Document Revised: 09/17/2014 Document Reviewed: 06/19/2013 °Elsevier Interactive Patient Education ©2016 Elsevier Inc. ° °

## 2015-06-28 NOTE — Anesthesia Preprocedure Evaluation (Signed)
Anesthesia Evaluation  Patient identified by MRN, date of birth, ID band Patient awake    Reviewed: Allergy & Precautions, H&P , NPO status , Patient's Chart, lab work & pertinent test results  Airway Mallampati: I  TM Distance: >3 FB Neck ROM: full    Dental no notable dental hx. (+) Teeth Intact   Pulmonary asthma ,    Pulmonary exam normal        Cardiovascular negative cardio ROS Normal cardiovascular exam     Neuro/Psych  Headaches, Anxiety Depression    GI/Hepatic GERD  Controlled,(+) Hepatitis -  Endo/Other  negative endocrine ROS  Renal/GU negative Renal ROS     Musculoskeletal  (+) Arthritis , Chronic back pain   Abdominal Normal abdominal exam  (+)   Peds  Hematology  (+) anemia , HIV,   Anesthesia Other Findings Taking antiviral medication for HIV  Reproductive/Obstetrics negative OB ROS                             Anesthesia Physical  Anesthesia Plan  ASA: III  Anesthesia Plan: General   Post-op Pain Management:    Induction: Intravenous  Airway Management Planned: Oral ETT  Additional Equipment:   Intra-op Plan:   Post-operative Plan: Extubation in OR  Informed Consent: I have reviewed the patients History and Physical, chart, labs and discussed the procedure including the risks, benefits and alternatives for the proposed anesthesia with the patient or authorized representative who has indicated his/her understanding and acceptance.   Dental advisory given  Plan Discussed with: CRNA and Anesthesiologist  Anesthesia Plan Comments:         Anesthesia Quick Evaluation

## 2015-06-28 NOTE — H&P (View-Only) (Signed)
CLINIC ENCOUNTER NOTE  History:  44 y.o. [id number] here today for preoperative visit prior to scheduled TAH/BS on 06/28/15 for fibroids, AUB after failed endometrial biopsy attempts x 2 and failed D&C on 05/03/15. She denies any abnormal vaginal discharge, bleeding, pelvic pain or other concerns.  On Megace.   Past Medical History  Diagnosis Date  . HIV infection ([place])   . Anemia   . Asthma   . GERD (gastroesophageal reflux disease)   . Chronic back pain   . Depression   . Bulging lumbar disc   . Anxiety   . Migraines   . Pneumonia   . Genital HSV   . Hepatitis     Past Surgical History  Procedure Laterality Date  . Hernia repair    . Other surgical history      biopsy axillary ,right arm  . Cesarean section      twice; horizontal and vertical incisions  . Right arm biopsy    . Esophageal manometry N/A 06/29/2013    Procedure: ESOPHAGEAL MANOMETRY (EM);  Surgeon: Jerene Bears, MD;  [facility];  Service: Gastroenterology;  Laterality: N/A;  . Heller myotomy N/A 10/08/2013    Procedure: LAPAROSCOPIC HELLER MYOTOMY, DOR FUNDIPLICATION, UPPER ENDOSCOPY;  Surgeon: Odis Hollingshead, MD;  [facility]: [facility];  Service: General;  Laterality: N/A;  . Upper gi endoscopy N/A 10/08/2013    Procedure: UPPER GI ENDOSCOPY;  Surgeon: Odis Hollingshead, MD;  [facility];  Service: General;  Laterality: N/A;  . Colonoscopy N/A 10/12/2013    Procedure: COLONOSCOPY;  Surgeon: Jerene Bears, MD;  [facility];  Service: Endoscopy;  Laterality: N/A;  . Hysteroscopy with resectoscope N/A 05/03/2015    Procedure: Diagnostic Hysteroscopy;  Surgeon: Lavonia Drafts, MD;  Location: [facility];  Service: Gynecology;  Laterality: N/A;  wants Novasure and myosure.  . Wisdom tooth extraction      The following portions of the patient's history were reviewed and updated as appropriate: allergies, current medications, past family history, past medical history, past social history,  past surgical history and problem list.   Health Maintenance:  Normal pap on 05/28/14.    Review of Systems:  Pertinent items noted in HPI and remainder of comprehensive ROS otherwise negative.  Objective:  Physical Exam BP 137/76 mmHg  Pulse 82  Temp(Src) 99.2 F (37.3 C) (Oral)  Ht 5\' 2"  (1.575 m)  Wt 163 lb 1.6 oz (73.982 kg)  BMI 29.82 kg/m2  LMP  (LMP Unknown) CONSTITUTIONAL: Well-developed, well-nourished female in no acute distress.  HENT:  Normocephalic, atraumatic. External right and left ear normal. Oropharynx is clear and moist EYES: Conjunctivae and EOM are normal. Pupils are equal, round, and reactive to light. No scleral icterus.  NECK: Normal range of motion, supple, no masses SKIN: Skin is warm and dry. No rash noted. Not diaphoretic. No erythema. No pallor. [place]: Alert and oriented to person, place, and time. Normal reflexes, muscle tone coordination. No cranial nerve deficit noted. PSYCHIATRIC: Normal mood and affect. Normal behavior. Normal judgment and thought content. CARDIOVASCULAR: Normal heart rate noted RESPIRATORY: Effort and breath sounds normal, no problems with respiration noted ABDOMEN: Soft, no distention noted.  Vertical incision noted. 16-18 week sized fibroid uterus palpated PELVIC: Deferred MUSCULOSKELETAL: Normal range of motion. No edema noted.   Assessment & Plan:  Patient will be undergoing a total abdominal hysterectomy (TAH) and prophylactic bilateral salpingectomy.  No indication for oophorectomy.  The risks of surgery were discussed in detail  with the patient including but not limited to: bleeding which may require transfusion or reoperation; infection which may require antibiotics; injury to bowel, bladder, ureters or other surrounding organs; need for additional procedures including laparotomy; thromboembolic phenomenon, incisional problems and other postoperative/anesthesia complications.  Patient was also advised that she will remain  in house for 2 nights; and expected recovery time after a hysterectomy is 6-8 weeks.  Likelihood of success in alleviating the patient's symptoms was discussed.   She was told that she will be contacted by our surgical scheduler regarding the time and date of her surgery; routine preoperative instructions of having nothing to eat or drink after midnight on the day prior to surgery and also coming to the hospital 1.5 hours prior to her time of surgery were also emphasized.  Routine postoperative instructions will be reviewed with the patient and her family in detail after surgery.  In the meantime, she will continue Megace; bleeding precautions were reviewed. Printed patient education handouts about the procedure was given to the patient to review at home.  Return in about 5 weeks (around 07/27/2015) for Postop visit after hysterectomy.   Total face-to-face time with patient: 15 minutes. Over 50% of encounter was spent on counseling and coordination of care.   Verita Schneiders, MD, [place] Attending Obstetrician & Gynecologist, [place] for [facility]

## 2015-06-28 NOTE — Anesthesia Procedure Notes (Signed)
Procedure Name: Intubation Date/Time: 06/28/2015 9:08 AM Performed by: Gilmer Mor R Pre-anesthesia Checklist: Patient identified, Patient being monitored, Timeout performed, Emergency Drugs available and Suction available Patient Re-evaluated:Patient Re-evaluated prior to inductionOxygen Delivery Method: Circle system utilized Preoxygenation: Pre-oxygenation with 100% oxygen Intubation Type: IV induction Ventilation: Mask ventilation without difficulty Laryngoscope Size: Mac and 3 Grade View: Grade III Tube type: Oral Tube size: 7.0 mm Number of attempts: 1 Airway Equipment and Method: Stylet Placement Confirmation: ETT inserted through vocal cords under direct vision,  breath sounds checked- equal and bilateral and positive ETCO2 Secured at: 21 cm Tube secured with: Tape Dental Injury: Teeth and Oropharynx as per pre-operative assessment

## 2015-06-28 NOTE — Interval H&P Note (Signed)
History and Physical Interval Note 06/28/2015 8:24 AM  [name]  has presented today for surgery, with the diagnosis of Abnormal uterine bleeding and enlarged fibroid uterus  The various methods of treatment have been discussed with the patient and family.  After consideration of risks, benefits and other options for treatment, the patient has consented to [place] a surgical intervention .  The patient's history has been reviewed, patient examined, no change in status, stable for surgery.  I have reviewed the patient's chart and labs.  Questions were answered to the patient's satisfaction.  To OR when ready.   Verita Schneiders, MD, [place] Attending Obstetrician & Gynecologist, [place], [place] for [employer]

## 2015-06-29 ENCOUNTER — Encounter (HOSPITAL_COMMUNITY): Payer: Self-pay | Admitting: Obstetrics & Gynecology

## 2015-06-29 ENCOUNTER — Ambulatory Visit: Payer: Medicare Other

## 2015-06-29 LAB — CBC
HCT: 30 % — ABNORMAL LOW (ref 36.0–46.0)
Hemoglobin: 9 g/dL — ABNORMAL LOW (ref 12.0–15.0)
MCH: 21.9 pg — ABNORMAL LOW (ref 26.0–34.0)
MCHC: 30 g/dL (ref 30.0–36.0)
MCV: 73 fL — ABNORMAL LOW (ref 78.0–100.0)
Platelets: 248 10*3/uL (ref 150–400)
RBC: 4.11 MIL/uL (ref 3.87–5.11)
RDW: 17.3 % — ABNORMAL HIGH (ref 11.5–15.5)
WBC: 9.1 10*3/uL (ref 4.0–10.5)

## 2015-06-29 MED ORDER — FERROUS SULFATE 325 (65 FE) MG PO TABS
325.0000 mg | ORAL_TABLET | Freq: Two times a day (BID) | ORAL | Status: DC
  Administered 2015-06-29 – 2015-06-30 (×2): 325 mg via ORAL
  Filled 2015-06-29 (×2): qty 1

## 2015-06-29 NOTE — Anesthesia Postprocedure Evaluation (Signed)
  Anesthesia Post-op Note  Patient: Danielle [name]  Procedure(s) Performed: Procedure(s) with comments: HYSTERECTOMY ABDOMINAL (N/A) - Requested 06/28/15 @ 7:30a BILATERAL SALPINGECTOMY (Bilateral) LYSIS OF ADHESION  Patient Location: [facility] Unit  Anesthesia Type:General  Level of Consciousness: awake, alert , oriented and patient cooperative  Airway and Oxygen Therapy: Patient Spontanous Breathing and Patient connected to nasal cannula oxygen  Post-op Pain: none  Post-op Assessment: Post-op Vital signs reviewed, Patient's Cardiovascular Status Stable, Respiratory Function Stable, Patent Airway and No signs of Nausea or vomiting              Post-op Vital Signs: Reviewed and stable  Last Vitals:  Filed Vitals:   06/29/15 0629  BP: 135/75  Pulse: 75  Temp: 37.2 C  Resp: 18    Complications: No apparent anesthesia complications

## 2015-06-29 NOTE — Progress Notes (Signed)
1 Day Post-Op Procedure(s) (LRB): HYSTERECTOMY ABDOMINAL (N/A) BILATERAL SALPINGECTOMY (Bilateral) LYSIS OF ADHESION  Subjective: Patient reports tolerating PO, + flatus and no problems voiding.  Ambulating without difficulty. No symptoms of anemia.  Objective: I have reviewed patient's vital signs, intake and output, medications and labs.  General: alert and no distress Resp: clear to auscultation bilaterally Cardio: regular rate and rhythm GI: soft, non-tender; bowel sounds normal; no masses,  no organomegaly and incision: clean, dry, intact and pressure dressing in place Extremities: extremities normal, atraumatic, no cyanosis or edema and Homans sign is negative, no sign of DVT Vaginal Bleeding: minimal  CBC Latest Ref Rng 06/29/2015 06/20/2015 05/03/2015  WBC 4.0 - 10.5 K/uL 9.1 4.7 5.7  Hemoglobin 12.0 - 15.0 g/dL 9.0(L) 9.9(L) 9.7(L)  Hematocrit 36.0 - 46.0 % 30.0(L) 33.5(L) 32.7(L)  Platelets 150 - 400 K/uL 248 257 294   Assessment: s/p Procedure(s) with comments: HYSTERECTOMY ABDOMINAL (N/A) - Requested 06/28/15 @ 7:30a BILATERAL SALPINGECTOMY (Bilateral) LYSIS OF ADHESION: stable, progressing well and tolerating diet  Plan: Advance diet Encourage ambulation Advance to PO medication Discontinue IV fluids Oral iron therapy ordered for anemia Discharge home tomorrow if remains stable  LOS: 1 day    Tasmin Exantus A, MD 06/29/2015, 1:42 PM

## 2015-06-29 NOTE — Addendum Note (Signed)
Addendum  created 06/29/15 0917 by Raenette Rover, CRNA   Modules edited: Notes Section   Notes Section:  File: [id number]

## 2015-06-30 MED ORDER — IBUPROFEN 600 MG PO TABS: 600.0000 mg | ORAL_TABLET | Freq: Four times a day (QID) | ORAL | Status: DC | PRN

## 2015-06-30 MED ORDER — DOCUSATE SODIUM 100 MG PO CAPS: 100.0000 mg | ORAL_CAPSULE | Freq: Every day | ORAL | Status: DC | PRN

## 2015-06-30 MED ORDER — FERROUS SULFATE 325 (65 FE) MG PO TABS: 325.0000 mg | ORAL_TABLET | Freq: Two times a day (BID) | ORAL | Status: DC

## 2015-06-30 MED ORDER — OXYCODONE-ACETAMINOPHEN 5-325 MG PO TABS: 1.0000 | ORAL_TABLET | Freq: Four times a day (QID) | ORAL | Status: DC | PRN

## 2015-06-30 NOTE — Progress Notes (Signed)
Pt is discharged in the care of husband,with N.T. Escort. Denies pain or discomfort. Spirits are good. Abdominal incision is clean and dry. No equipment needed for home use. Stable. Understands all discharged instructions well. Questions asked and answered.

## 2015-06-30 NOTE — Discharge Summary (Signed)
Gynecology Physician Postoperative Discharge Summary  Patient ID: Danielle [name] MRN: [id number] DOB/AGE: 1970-12-25 44 y.o.  Admit Date: 06/28/2015 Discharge Date: 06/30/2015  Preoperative Diagnoses: Symptomatic fibroids, abnormal uterine bleeding  Procedures: Procedure(s) (LRB): HYSTERECTOMY ABDOMINAL (N/A) BILATERAL SALPINGECTOMY (Bilateral) LYSIS OF ADHESIONS  Hospital Course:  Danielle [name] is a 44 y.o. [id number]  admitted for scheduled surgery.  She underwent the procedures as mentioned above, her operation was uncomplicated. For further details about surgery, please refer to the operative report. Patient had an uncomplicated postoperative course. Her pathology returned as benign, this was discussed with patient.  By time of discharge on POD#2, her pain was controlled on oral pain medications; she was ambulating, voiding without difficulty, tolerating regular diet and passing flatus. She was deemed stable for discharge to home.   Significant Labs: CBC Latest Ref Rng 06/29/2015 06/20/2015 05/03/2015  WBC 4.0 - 10.5 K/uL 9.1 4.7 5.7  Hemoglobin 12.0 - 15.0 g/dL 9.0(L) 9.9(L) 9.7(L)  Hematocrit 36.0 - 46.0 % 30.0(L) 33.5(L) 32.7(L)  Platelets 150 - 400 K/uL 248 257 294   Surgical Pathology: Uterus and bilateral fallopian tubes, cervix - BENIGN UTERINE LEIOMYOMATA (UP TO 4.5 CM) - BENIGN ENDOMETRIAL GLANDS AND STROMA WITH PROGESTERONE INDUCED STROMAL CHANGE AND CYSTIC CHANGE; NEGATIVE FOR HYPERPLASIA OR MALIGNANCY. - BENIGN ENDOMETRIAL POLYP; NEGATIVE FOR ATYPIA OR MALIGNANCY. - BENIGN UTERINE ADENOMYOSIS; NEGATIVE FOR ATYPIA OR MALIGNANCY. - BENIGN CERVICAL MUCOSA; NEGATIVE FOR INTRAEPITHELIAL LESION OR MALIGNANCY. - UNREMARKABLE UTERINE SEROSA. - BENIGN RIGHT AND LEFT FALLOPIAN TUBES; NEGATIVE FOR ATYPIA OR MALIGNANCY.  Discharge Exam: Blood pressure 115/62, pulse 92, temperature 98.9 F (37.2 C), temperature source Oral, resp. rate 18, height 5\' 1"  (1.549 m), weight 164 lb  (74.39 kg), last menstrual period 04/11/2015, SpO2 100 %. General appearance: alert and no distress  Resp: clear to auscultation bilaterally  Cardio: regular rate and rhythm  GI: soft, non-tender; bowel sounds normal; no masses, no organomegaly.  Incision: C/D/I, no erythema, old drainage noted on honeycomb dressing Pelvic: scant blood on pad  Extremities: extremities normal, atraumatic, no cyanosis or edema and Homans sign is negative, no sign of DVT  Discharged Condition: Stable  Disposition: 01-Home or Self Care     Medication List    STOP taking these medications        megestrol 40 MG tablet  Commonly known as:  MEGACE      TAKE these medications        docusate sodium 100 MG capsule  Commonly known as:  COLACE  Take 1 capsule (100 mg total) by mouth daily as needed for mild constipation.     dolutegravir 50 MG tablet  Commonly known as:  TIVICAY  Take 1 tablet (50 mg total) by mouth every evening.     ferrous sulfate 325 (65 FE) MG tablet  Take 1 tablet (325 mg total) by mouth 2 (two) times daily with a meal.     ibuprofen 600 MG tablet  Commonly known as:  ADVIL,MOTRIN  Take 600 mg by mouth every 6 (six) hours as needed.     ibuprofen 600 MG tablet  Commonly known as:  ADVIL,MOTRIN  Take 1 tablet (600 mg total) by mouth every 6 (six) hours as needed for headache, mild pain, moderate pain or cramping (mild pain).     IRON PO  Take 1 tablet by mouth daily.     oxyCODONE-acetaminophen 5-325 MG tablet  Commonly known as:  PERCOCET/ROXICET  Take 1-2 tablets by mouth every 6 (six) hours as  needed for severe pain (moderate to severe pain (when tolerating fluids)).     PROAIR HFA 108 (90 BASE) MCG/ACT inhaler  Generic drug:  albuterol  Inhale 2 puffs into the lungs every 4 (four) hours as needed for wheezing or shortness of breath. Shortness of breath/wheezing     ranitidine 150 MG tablet  Commonly known as:  ZANTAC  TAKE 1 TABLET EVERY NIGHT AT BEDTIME(12 HRS  APART FROM REYETAZ)     TRUVADA 200-300 MG tablet  Generic drug:  emtricitabine-tenofovir  TAKE 1 TABLET BY MOUTH EVERY DAY     valACYclovir 500 MG tablet  Commonly known as:  VALTREX  Take 1 tablet (500 mg total) by mouth daily.           Follow-up Information    Follow up with [facility] On 07/11/2015.   Why:  Staple removal   Contact information:   [place] [phone]      Follow up with Osborne Oman, MD On 07/27/2015.   Specialty:  Obstetrics and Gynecology   Why:  2:30 pm for postoperative appointment. Call clinic/come to [facility] for any concerning issues   Contact information:   [place] [id number] [phone]       Signed:  Verita Schneiders, MD, [place] Attending [place], [place] [facility]

## 2015-06-30 NOTE — Care Management Important Message (Signed)
Important Message  Patient Details  Name: Danielle [name] MRN: [id number] Date of Birth: 07-16-1971   Medicare Important Message Given:       Shelda Altes 06/30/2015, 12:12 [place] Message  Patient Details  Name: Danielle [name] MRN: [id number] Date of Birth: [birth year]   Medicare Important Message Given:       Shelda Altes 06/30/2015, 12:11 PM

## 2015-07-11 ENCOUNTER — Encounter: Payer: Self-pay | Admitting: *Deleted

## 2015-07-11 ENCOUNTER — Ambulatory Visit: Payer: Medicare Other | Admitting: *Deleted

## 2015-07-11 VITALS — Wt 158.5 lb

## 2015-07-11 DIAGNOSIS — Z90710 Acquired absence of both cervix and uterus: Secondary | ICD-10-CM

## 2015-07-11 NOTE — Progress Notes (Signed)
Honeycomb dressing with small amt old dried bloody drainage removed. Observed 23 staples to vertical midline lower abdomen. Incision well approximated, no drainage edema or erythema noted. Staples removed, patient tolerated well. Incision cleaned with sterile normal saline and 2x2 gauze, patted dry, left open to air. Advised patient she may shower as usual and wash gently with soap and water. Notify if signs of infection (redness, edema, drainage, increased pain, fever, or wound reopens) develop. Understanding voiced.

## 2015-07-13 ENCOUNTER — Ambulatory Visit: Payer: Medicare Other

## 2015-07-27 ENCOUNTER — Other Ambulatory Visit: Payer: Self-pay | Admitting: Obstetrics & Gynecology

## 2015-07-27 ENCOUNTER — Ambulatory Visit: Payer: Medicare Other | Admitting: Obstetrics & Gynecology

## 2015-07-27 ENCOUNTER — Encounter: Payer: Self-pay | Admitting: Obstetrics & Gynecology

## 2015-07-27 ENCOUNTER — Other Ambulatory Visit: Payer: Self-pay

## 2015-07-27 VITALS — BP 115/75 | HR 78 | Temp 99.2°F | Resp 20 | Ht 61.0 in | Wt 162.6 lb

## 2015-07-27 DIAGNOSIS — D241 Benign neoplasm of right breast: Secondary | ICD-10-CM

## 2015-07-27 DIAGNOSIS — Z90710 Acquired absence of both cervix and uterus: Secondary | ICD-10-CM

## 2015-07-27 DIAGNOSIS — Z09 Encounter for follow-up examination after completed treatment for conditions other than malignant neoplasm: Secondary | ICD-10-CM

## 2015-07-27 NOTE — Progress Notes (Signed)
CLINIC ENCOUNTER NOTE  History:  44 y.o. [id number] here today for postoperative check s/p TAH, BS on 06/28/15. Has occasional twinges of pain around incision. No other postoperative concerns.  She denies any abnormal vaginal discharge, bleeding, pelvic pain or other concerns.   Past Medical History  Diagnosis Date  . HIV infection ([place])   . Anemia   . Asthma   . GERD (gastroesophageal reflux disease)   . Chronic back pain   . Depression   . Bulging lumbar disc   . Anxiety   . Migraines   . Pneumonia   . Genital HSV   . Hepatitis     Past Surgical History  Procedure Laterality Date  . Hernia repair    . Other surgical history      biopsy axillary ,right arm  . Cesarean section      twice;  vertical incision x 2  . Right arm biopsy    . Esophageal manometry N/A 06/29/2013    Procedure: ESOPHAGEAL MANOMETRY (EM);  Surgeon: Jerene Bears, MD;  [facility];  Service: Gastroenterology;  Laterality: N/A;  . Heller myotomy N/A 10/08/2013    Procedure: LAPAROSCOPIC HELLER MYOTOMY, DOR FUNDIPLICATION, UPPER ENDOSCOPY;  Surgeon: Odis Hollingshead, MD;  [facility];  Service: General;  Laterality: N/A;  . Upper gi endoscopy N/A 10/08/2013    Procedure: UPPER GI ENDOSCOPY;  Surgeon: Odis Hollingshead, MD;  [facility];  Service: General;  Laterality: N/A;  . Colonoscopy N/A 10/12/2013    Procedure: COLONOSCOPY;  Surgeon: Jerene Bears, MD;  [facility];  Service: Endoscopy;  Laterality: N/A;  . Hysteroscopy with resectoscope N/A 05/03/2015    Procedure: Diagnostic Hysteroscopy;  Surgeon: Lavonia Drafts, MD;  Location: [facility];  Service: Gynecology;  Laterality: N/A;  wants Novasure and myosure.  . Wisdom tooth extraction    . Abdominal hysterectomy N/A 06/28/2015    Procedure: HYSTERECTOMY ABDOMINAL;  Surgeon: Osborne Oman, MD;  Location: [facility];  Service: Gynecology;  Laterality: N/A;  Requested 06/28/15 @ 7:30a  . Bilateral salpingectomy Bilateral  06/28/2015    Procedure: BILATERAL SALPINGECTOMY;  Surgeon: Osborne Oman, MD;  Location: [facility];  Service: Gynecology;  Laterality: Bilateral;  . Lysis of adhesion  06/28/2015    Procedure: LYSIS OF ADHESION;  Surgeon: Osborne Oman, MD;  Location: [facility];  Service: Gynecology;;    The following portions of the patient's history were reviewed and updated as appropriate: allergies, current medications, past family history, past medical history, past social history, past surgical history and problem list.    Review of Systems:  Pertinent items noted in HPI and remainder of comprehensive ROS otherwise negative.  Objective:  Physical Exam BP 115/75 mmHg  Pulse 78  Temp(Src) 99.2 F (37.3 C) (Oral)  Resp 20  Ht 5\' 1"  (1.549 m)  Wt 162 lb 9.6 oz (73.755 kg)  BMI 30.74 kg/m2  LMP  (LMP Unknown) CONSTITUTIONAL: Well-developed, well-nourished female in no acute distress.  HENT:  Normocephalic, atraumatic. External right and left ear normal. Oropharynx is clear and moist EYES: Conjunctivae and EOM are normal. Pupils are equal, round, and reactive to light. No scleral icterus.  NECK: Normal range of motion, supple, no masses SKIN: Skin is warm and dry. No rash noted. Not diaphoretic. No erythema. No pallor. [place]: Alert and oriented to person, place, and time. Normal reflexes, muscle tone coordination. No cranial nerve deficit noted. PSYCHIATRIC: Normal mood and affect. Normal behavior. Normal judgment and  thought content. CARDIOVASCULAR: Normal heart rate noted RESPIRATORY: Effort and breath sounds normal, no problems with respiration noted ABDOMEN: Soft, no distention noted.  Well-healed vertical incision. PELVIC: Normal appearing external genitalia; normal appearing vaginal mucosa and well-healed cuff.  No abnormal discharge noted.   MUSCULOSKELETAL: Normal range of motion. No edema noted.  Surgical Pathology: Uterus and bilateral fallopian tubes, cervix - BENIGN UTERINE  LEIOMYOMATA (UP TO 4.5 CM) - BENIGN ENDOMETRIAL GLANDS AND STROMA WITH PROGESTERONE INDUCED STROMAL CHANGE AND CYSTIC CHANGE; NEGATIVE FOR HYPERPLASIA OR MALIGNANCY. - BENIGN ENDOMETRIAL POLYP; NEGATIVE FOR ATYPIA OR MALIGNANCY. - BENIGN UTERINE ADENOMYOSIS; NEGATIVE FOR ATYPIA OR MALIGNANCY. - BENIGN CERVICAL MUCOSA; NEGATIVE FOR INTRAEPITHELIAL LESION OR MALIGNANCY. - UNREMARKABLE UTERINE SEROSA. - BENIGN RIGHT AND LEFT FALLOPIAN TUBES; NEGATIVE FOR ATYPIA OR MALIGNANCY.   Assessment & Plan:  S/P total abdominal hysterectomy, postoperative check Normal postoperative check.  Pathology reviewed with patient. Emphasized pelvic rest for 8 weeks after hysterectomy Routine preventative health maintenance measures emphasized. Please refer to After Visit Summary for other counseling recommendations. Of note, patient had a mammogram in 2012 that showed R fibroadenoma, no follow up since.  Diagnostic mammogram ordered as per [facility] recommendations; this will be scheduled.    Verita Schneiders, MD, [name] Attending Obstetrician & Gynecologist, [place] for [facility]

## 2015-07-27 NOTE — Progress Notes (Signed)
Patient ID: NAGMA DICUS, female   DOB: [birth year], 44 y.o.   MRN: [id number] Pt states she was not given Rx for iron by her pharmacy and did not know she should be taking. She will obtain Rx today. Pt is still having abdominal pain occasionally and cannot lie on her side without having pain - needs to sleep on her back.

## 2015-08-08 ENCOUNTER — Ambulatory Visit
Admission: RE | Admit: 2015-08-08 | Discharge: 2015-08-08 | Disposition: A | Discharge status: Home / Self Care | Payer: Medicare Other | Source: Ambulatory Visit | Attending: Obstetrics & Gynecology | Admitting: Obstetrics & Gynecology

## 2015-08-08 DIAGNOSIS — D241 Benign neoplasm of right breast: Secondary | ICD-10-CM

## 2015-10-06 ENCOUNTER — Ambulatory Visit: Payer: Medicare Other

## 2015-10-06 DIAGNOSIS — F3160 Bipolar disorder, current episode mixed, unspecified: Secondary | ICD-10-CM

## 2015-10-06 NOTE — BH Specialist Note (Signed)
Danielle [name] came in for the first time in several months, reporting that she had stomach surgery last fall and was incapacitated for several weeks.  She went into detail about her experience, but said that she is much better now.  She also talked about her 43 year old daughter, who still lives at home, and is finally working a part time job.  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  Plan to meet again in 2 weeks. [name], LCSW

## 2015-10-16 ENCOUNTER — Encounter (HOSPITAL_COMMUNITY): Payer: Self-pay | Admitting: *Deleted

## 2015-10-16 ENCOUNTER — Emergency Department (HOSPITAL_COMMUNITY)
Admission: EM | Admit: 2015-10-16 | Discharge: 2015-10-16 | Disposition: A | Discharge status: Home / Self Care | Payer: Medicare Other | Attending: Emergency Medicine | Admitting: Emergency Medicine

## 2015-10-16 ENCOUNTER — Emergency Department (HOSPITAL_COMMUNITY): Payer: Medicare Other

## 2015-10-16 DIAGNOSIS — Z8639 Personal history of other endocrine, nutritional and metabolic disease: Secondary | ICD-10-CM | POA: Diagnosis not present

## 2015-10-16 DIAGNOSIS — H9202 Otalgia, left ear: Secondary | ICD-10-CM | POA: Diagnosis present

## 2015-10-16 DIAGNOSIS — R509 Fever, unspecified: Secondary | ICD-10-CM | POA: Diagnosis not present

## 2015-10-16 DIAGNOSIS — Z8719 Personal history of other diseases of the digestive system: Secondary | ICD-10-CM | POA: Diagnosis not present

## 2015-10-16 DIAGNOSIS — I10 Essential (primary) hypertension: Secondary | ICD-10-CM | POA: Insufficient documentation

## 2015-10-16 DIAGNOSIS — J069 Acute upper respiratory infection, unspecified: Secondary | ICD-10-CM

## 2015-10-16 DIAGNOSIS — H7292 Unspecified perforation of tympanic membrane, left ear: Secondary | ICD-10-CM | POA: Diagnosis not present

## 2015-10-16 DIAGNOSIS — Z79899 Other long term (current) drug therapy: Secondary | ICD-10-CM | POA: Diagnosis not present

## 2015-10-16 DIAGNOSIS — Z880 Allergy status to penicillin: Secondary | ICD-10-CM | POA: Insufficient documentation

## 2015-10-16 DIAGNOSIS — Z792 Long term (current) use of antibiotics: Secondary | ICD-10-CM | POA: Insufficient documentation

## 2015-10-16 DIAGNOSIS — R05 Cough: Secondary | ICD-10-CM | POA: Diagnosis not present

## 2015-10-16 LAB — BASIC METABOLIC PANEL
Anion gap: 8 (ref 5–15)
BUN: 11 mg/dL (ref 6–20)
CALCIUM: 8.9 mg/dL (ref 8.9–10.3)
CO2: 23 mmol/L (ref 22–32)
CREATININE: 1.21 mg/dL — AB (ref 0.44–1.00)
Chloride: 107 mmol/L (ref 101–111)
GFR calc non Af Amer: 54 mL/min — ABNORMAL LOW (ref >60)
Glucose, Bld: 97 mg/dL (ref 65–99)
Potassium: 3.6 mmol/L (ref 3.5–5.1)
SODIUM: 138 mmol/L (ref 135–145)

## 2015-10-16 LAB — CBC
HCT: 36.2 % (ref 36.0–46.0)
Hemoglobin: 11.1 g/dL — ABNORMAL LOW (ref 12.0–15.0)
MCH: 24 pg — ABNORMAL LOW (ref 26.0–34.0)
MCHC: 30.7 g/dL (ref 30.0–36.0)
MCV: 78.2 fL (ref 78.0–100.0)
PLATELETS: 217 10*3/uL (ref 150–400)
RBC: 4.63 MIL/uL (ref 3.87–5.11)
RDW: 17 % — ABNORMAL HIGH (ref 11.5–15.5)
WBC: 3.7 10*3/uL — AB (ref 4.0–10.5)

## 2015-10-16 NOTE — ED Notes (Signed)
Patient transported to X-ray 

## 2015-10-16 NOTE — ED Notes (Signed)
Pt provided with ginger ale per Dr Ashok Cordia

## 2015-10-16 NOTE — ED Provider Notes (Signed)
CSN: [id number]     Arrival date & time 10/16/15  [id number] History   First MD Initiated Contact with Patient 10/16/15 [phone]     Chief Complaint  Patient presents with  . Chest Pain  . Headache     (Consider location/radiation/quality/duration/timing/severity/associated sxs/prior Treatment) Patient is a 45 y.o. female presenting with chest pain and headaches. The history is provided by the patient.  Chest Pain Associated symptoms: cough and headache   Associated symptoms: no abdominal pain, no back pain, no fever, no numbness, no shortness of breath, not vomiting and no weakness   Headache Associated symptoms: cough, myalgias and sore throat   Associated symptoms: no abdominal pain, no back pain, no diarrhea, no fever, no neck pain, no neck stiffness, no numbness, no vomiting and no weakness   Patient c/o dry cough, congestion, fevers, intermittent frontal headache, nasal congestion, body aches for the past week.  Cough episodic, persistent. Pt w family members w similar symptoms, in ED as pts to get checked. Hx asthma, uses mdi prn, no prior admits for asthma. Hx hiv.  No current sob or wheezing. No vomiting or diarrhea. Normal appetite.   States when coughs, chest wall soreness. No other exertional or episodic chest pain or discomfort.       Past Medical History  Diagnosis Date  . HIV infection ([place])   . Anemia   . Asthma   . GERD (gastroesophageal reflux disease)   . Chronic back pain   . Depression   . Bulging lumbar disc   . Anxiety   . Migraines   . Pneumonia   . Genital HSV   . Hepatitis    Past Surgical History  Procedure Laterality Date  . Hernia repair    . Other surgical history      biopsy axillary ,right arm  . Cesarean section      twice;  vertical incision x 2  . Right arm biopsy    . Esophageal manometry N/A 06/29/2013    Procedure: ESOPHAGEAL MANOMETRY (EM);  Surgeon: Jerene Bears, MD;  Location: [facility];  Service: Gastroenterology;  Laterality: N/A;  .  Heller myotomy N/A 10/08/2013    Procedure: LAPAROSCOPIC HELLER MYOTOMY, DOR FUNDIPLICATION, UPPER ENDOSCOPY;  Surgeon: Odis Hollingshead, MD;  Location: [facility];  Service: General;  Laterality: N/A;  . Upper gi endoscopy N/A 10/08/2013    Procedure: UPPER GI ENDOSCOPY;  Surgeon: Odis Hollingshead, MD;  Location: [facility];  Service: General;  Laterality: N/A;  . Colonoscopy N/A 10/12/2013    Procedure: COLONOSCOPY;  Surgeon: Jerene Bears, MD;  Location: [facility];  Service: Endoscopy;  Laterality: N/A;  . Hysteroscopy with resectoscope N/A 05/03/2015    Procedure: Diagnostic Hysteroscopy;  Surgeon: Lavonia Drafts, MD;  Location: [facility];  Service: Gynecology;  Laterality: N/A;  wants Novasure and myosure.  . Wisdom tooth extraction    . Abdominal hysterectomy N/A 06/28/2015    Procedure: HYSTERECTOMY ABDOMINAL;  Surgeon: Osborne Oman, MD;  Location: [facility];  Service: Gynecology;  Laterality: N/A;  Requested 06/28/15 @ 7:30a  . Bilateral salpingectomy Bilateral 06/28/2015    Procedure: BILATERAL SALPINGECTOMY;  Surgeon: Osborne Oman, MD;  Location: [facility];  Service: Gynecology;  Laterality: Bilateral;  . Lysis of adhesion  06/28/2015    Procedure: LYSIS OF ADHESION;  Surgeon: Osborne Oman, MD;  Location: [facility];  Service: Gynecology;;   Family History  Problem Relation Age of Onset  . Hyperlipidemia Mother   . Diabetes  Mother   . Stroke Mother   . Diabetes Brother   . Colon cancer Father   . Esophageal cancer Neg Hx   . Rectal cancer Neg Hx   . Stomach cancer Neg Hx    Social History  Substance Use Topics  . Smoking status: Never Smoker   . Smokeless tobacco: Never Used  . Alcohol Use: No   OB History    Gravida Para Term Preterm AB TAB SAB Ectopic Multiple Living   5 3 3  2  2   3      Review of Systems  Constitutional: Negative for fever and chills.  HENT: Positive for sore throat.   Eyes: Negative for redness.  Respiratory: Positive for cough. Negative for  shortness of breath.   Cardiovascular: Positive for chest pain. Negative for leg swelling.  Gastrointestinal: Negative for vomiting, abdominal pain and diarrhea.  Genitourinary: Negative for flank pain.  Musculoskeletal: Positive for myalgias. Negative for back pain, neck pain and neck stiffness.  Skin: Negative for rash.  Neurological: Positive for headaches. Negative for weakness and numbness.  Hematological: Does not bruise/bleed easily.  Psychiatric/Behavioral: Negative for confusion.      Allergies  Latex  Home Medications   Prior to Admission medications   Medication Sig Start Date End Date Taking? Authorizing Provider  docusate sodium (COLACE) 100 MG capsule Take 1 capsule (100 mg total) by mouth daily as needed for mild constipation. 06/30/15   Osborne Oman, MD  dolutegravir (TIVICAY) 50 MG tablet Take 1 tablet (50 mg total) by mouth every evening. 10/11/14   Campbell Riches, MD  ferrous sulfate 325 (65 FE) MG tablet Take 1 tablet (325 mg total) by mouth 2 (two) times daily with a meal. Patient not taking: Reported on 07/27/2015 06/30/15   Osborne Oman, MD  ibuprofen (ADVIL,MOTRIN) 600 MG tablet Take 1 tablet (600 mg total) by mouth every 6 (six) hours as needed for headache, mild pain, moderate pain or cramping (mild pain). 06/30/15   Osborne Oman, MD  oxyCODONE-acetaminophen (PERCOCET/ROXICET) 5-325 MG tablet Take 1-2 tablets by mouth every 6 (six) hours as needed for severe pain (moderate to severe pain (when tolerating fluids)). 06/30/15   Osborne Oman, MD  PROAIR HFA 108 (90 BASE) MCG/ACT inhaler Inhale 2 puffs into the lungs every 4 (four) hours as needed for wheezing or shortness of breath. Shortness of breath/wheezing 12/31/14   Historical Provider, MD  ranitidine (ZANTAC) 150 MG tablet TAKE 1 TABLET EVERY NIGHT AT BEDTIME(12 HRS APART FROM REYETAZ) 06/27/15   Campbell Riches, MD  TRUVADA 200-300 MG per tablet TAKE 1 TABLET BY MOUTH EVERY DAY 05/27/15    Campbell Riches, MD  valACYclovir (VALTREX) 500 MG tablet Take 1 tablet (500 mg total) by mouth daily. Patient not taking: Reported on 07/27/2015 05/03/15   Lavonia Drafts, MD   BP 119/75 mmHg  Pulse 102  Temp(Src) 98.8 F (37.1 C)  Resp 16  SpO2 98%  LMP  (LMP Unknown) Physical Exam  Constitutional: She appears well-developed and well-nourished. No distress.  HENT:  Mouth/Throat: Oropharynx is clear and moist.  Eyes: Conjunctivae are normal. No scleral icterus.  Neck: Neck supple. No tracheal deviation present. No thyromegaly present.  No stiffness or rigidity  Cardiovascular: Normal rate, regular rhythm, normal heart sounds and intact distal pulses.  Exam reveals no gallop and no friction rub.   No murmur heard. Pulmonary/Chest: Effort normal and breath sounds normal. No respiratory distress. She exhibits tenderness.  Chest wall tenderness w palpation, and w coughing spell, reproducing symptoms.   Abdominal: Soft. Normal appearance and bowel sounds are normal. She exhibits no distension and no mass. There is no tenderness. There is no rebound and no guarding.  No hsm.   Genitourinary:  No cva tenderness  Musculoskeletal: She exhibits no edema or tenderness.  Neurological: She is alert.  Skin: Skin is warm and dry. No rash noted. She is not diaphoretic.  Psychiatric: She has a normal mood and affect.  Nursing note and vitals reviewed.   ED Course  Procedures (including critical care time) Labs Review  Results for orders placed or performed during the hospital encounter of 0000000  Basic metabolic panel  Result Value Ref Range   Sodium 138 135 - 145 mmol/L   Potassium 3.6 3.5 - 5.1 mmol/L   Chloride 107 101 - 111 mmol/L   CO2 23 22 - 32 mmol/L   Glucose, Bld 97 65 - 99 mg/dL   BUN 11 6 - 20 mg/dL   Creatinine, Ser 1.21 (H) 0.44 - 1.00 mg/dL   Calcium 8.9 8.9 - 10.3 mg/dL   GFR calc non Af Amer 54 (L) >60 mL/min   GFR calc Af Amer >60 >60 mL/min   Anion gap  8 5 - 15  CBC  Result Value Ref Range   WBC 3.7 (L) 4.0 - 10.5 K/uL   RBC 4.63 3.87 - 5.11 MIL/uL   Hemoglobin 11.1 (L) 12.0 - 15.0 g/dL   HCT 36.2 36.0 - 46.0 %   MCV 78.2 78.0 - 100.0 fL   MCH 24.0 (L) 26.0 - 34.0 pg   MCHC 30.7 30.0 - 36.0 g/dL   RDW 17.0 (H) 11.5 - 15.5 %   Platelets 217 150 - 400 K/uL   Dg Chest 2 View  10/16/2015  CLINICAL DATA:  Central chest pain with cough and fever for 1 week. EXAM: CHEST  2 VIEW COMPARISON:  10/11/2013 FINDINGS: Both lungs are clear. Heart and mediastinum are within normal limits. Trachea is midline. No acute bone abnormality. No large pleural effusions. IMPRESSION: No active cardiopulmonary disease. Electronically Signed   By: Markus Daft M.D.   On: 10/16/2015 10:51       I have personally reviewed and evaluated these images and lab results as part of my medical decision-making.   EKG Interpretation   Date/Time:  Sunday October 16 2015 09:40:10 EST Ventricular Rate:  105 PR Interval:  129 QRS Duration: 92 QT Interval:  334 QTC Calculation: 441 R Axis:   61 Text Interpretation:  Sinus tachycardia Borderline T wave abnormalities  Baseline wander No significant change since last tracing Confirmed by  Ashok Cordia  MD, Lennette Bihari ([id number]) on 10/16/2015 9:49:10 AM      MDM   Cxr. Labs.  Reviewed nursing notes and prior charts for additional history.   cxr neg.  Symptoms and exam felt most c/w viral uri.   No dyspnea or increased wob.  Pulse ox 98%.    rec symptomatic rx.   pcp f/u.  Return precautions provided.       Lajean Saver, MD 10/16/15 [phone]

## 2015-10-16 NOTE — Discharge Instructions (Signed)
It was our pleasure to provide your ER care today - we hope that you feel better.  Your symptoms and exam appear most consistent with a viral upper respiratory illness which should run its course and get better in the next few days.  Rest. Drink plenty of fluids.  Take multi-symptom cold/flu medication like mucinex or nyquil as need for symptom relief.  Follow up with primary care doctor in 1 week if symptoms fail to improve/resolve.  Return to ER if worse, new symptoms, trouble breathing, persistent/recurrent chest pain, persistent vomiting, weak/fainting, other concern.     Upper Respiratory Infection, Adult Most upper respiratory infections (URIs) are a viral infection of the air passages leading to the lungs. A URI affects the nose, throat, and upper air passages. The most common type of URI is nasopharyngitis and is typically referred to as "the common cold." URIs run their course and usually go away on their own. Most of the time, a URI does not require medical attention, but sometimes a bacterial infection in the upper airways can follow a viral infection. This is called a secondary infection. Sinus and middle ear infections are common types of secondary upper respiratory infections. Bacterial pneumonia can also complicate a URI. A URI can worsen asthma and chronic obstructive pulmonary disease (COPD). Sometimes, these complications can require emergency medical care and may be life threatening.  CAUSES Almost all URIs are caused by viruses. A virus is a type of germ and can spread from one person to another.  RISKS FACTORS You may be at risk for a URI if:   You smoke.   You have chronic heart or lung disease.  You have a weakened defense (immune) system.   You are very young or very old.   You have nasal allergies or asthma.  You work in crowded or poorly ventilated areas.  You work in health care facilities or schools. SIGNS AND SYMPTOMS  Symptoms typically develop  2-3 days after you come in contact with a cold virus. Most viral URIs last 7-10 days. However, viral URIs from the influenza virus (flu virus) can last 14-18 days and are typically more severe. Symptoms may include:   Runny or stuffy (congested) nose.   Sneezing.   Cough.   Sore throat.   Headache.   Fatigue.   Fever.   Loss of appetite.   Pain in your forehead, behind your eyes, and over your cheekbones (sinus pain).  Muscle aches.  DIAGNOSIS  Your health care provider may diagnose a URI by:  Physical exam.  Tests to check that your symptoms are not due to another condition such as:  Strep throat.  Sinusitis.  Pneumonia.  Asthma. TREATMENT  A URI goes away on its own with time. It cannot be cured with medicines, but medicines may be prescribed or recommended to relieve symptoms. Medicines may help:  Reduce your fever.  Reduce your cough.  Relieve nasal congestion. HOME CARE INSTRUCTIONS   Take medicines only as directed by your health care provider.   Gargle warm saltwater or take cough drops to comfort your throat as directed by your health care provider.  Use a warm mist humidifier or inhale steam from a shower to increase air moisture. This may make it easier to breathe.  Drink enough fluid to keep your urine clear or pale yellow.   Eat soups and other clear broths and maintain good nutrition.   Rest as needed.   Return to work when your temperature has returned  to normal or as your health care provider advises. You may need to stay home longer to avoid infecting others. You can also use a face mask and careful hand washing to prevent spread of the virus.  Increase the usage of your inhaler if you have asthma.   Do not use any tobacco products, including cigarettes, chewing tobacco, or electronic cigarettes. If you need help quitting, ask your health care provider. PREVENTION  The best way to protect yourself from getting a cold is to  practice good hygiene.   Avoid oral or hand contact with people with cold symptoms.   Wash your hands often if contact occurs.  There is no clear evidence that vitamin C, vitamin E, echinacea, or exercise reduces the chance of developing a cold. However, it is always recommended to get plenty of rest, exercise, and practice good nutrition.  SEEK MEDICAL CARE IF:   You are getting worse rather than better.   Your symptoms are not controlled by medicine.   You have chills.  You have worsening shortness of breath.  You have brown or red mucus.  You have yellow or brown nasal discharge.  You have pain in your face, especially when you bend forward.  You have a fever.  You have swollen neck glands.  You have pain while swallowing.  You have white areas in the back of your throat. SEEK IMMEDIATE MEDICAL CARE IF:   You have severe or persistent:  Headache.  Ear pain.  Sinus pain.  Chest pain.  You have chronic lung disease and any of the following:  Wheezing.  Prolonged cough.  Coughing up blood.  A change in your usual mucus.  You have a stiff neck.  You have changes in your:  Vision.  Hearing.  Thinking.  Mood. MAKE SURE YOU:   Understand these instructions.  Will watch your condition.  Will get help right away if you are not doing well or get worse.   This information is not intended to replace advice given to you by your health care provider. Make sure you discuss any questions you have with your health care provider.   Document Released: 02/20/2001 Document Revised: 01/11/2015 Document Reviewed: 12/02/2013 Elsevier Interactive Patient Education 2016 Elsevier Inc.     Cough, Adult A cough helps to clear your throat and lungs. A cough may last only 2-3 weeks (acute), or it may last longer than 8 weeks (chronic). Many different things can cause a cough. A cough may be a sign of an illness or another medical condition. HOME CARE  Pay  attention to any changes in your cough.  Take medicines only as told by your doctor.  If you were prescribed an antibiotic medicine, take it as told by your doctor. Do not stop taking it even if you start to feel better.  Talk with your doctor before you try using a cough medicine.  Drink enough fluid to keep your pee (urine) clear or pale yellow.  If the air is dry, use a cold steam vaporizer or humidifier in your home.  Stay away from things that make you cough at work or at home.  If your cough is worse at night, try using extra pillows to raise your head up higher while you sleep.  Do not smoke, and try not to be around smoke. If you need help quitting, ask your doctor.  Do not have caffeine.  Do not drink alcohol.  Rest as needed. GET HELP IF:  You have  new problems (symptoms).  You cough up yellow fluid (pus).  Your cough does not get better after 2-3 weeks, or your cough gets worse.  Medicine does not help your cough and you are not sleeping well.  You have pain that gets worse or pain that is not helped with medicine.  You have a fever.  You are losing weight and you do not know why.  You have night sweats. GET HELP RIGHT AWAY IF:  You cough up blood.  You have trouble breathing.  Your heartbeat is very fast.   This information is not intended to replace advice given to you by your health care provider. Make sure you discuss any questions you have with your health care provider.   Document Released: 05/10/2011 Document Revised: 05/18/2015 Document Reviewed: 11/03/2014 Elsevier Interactive Patient Education [employer].

## 2015-10-16 NOTE — ED Notes (Signed)
Pt reports central cp with h/a and fever x 1 week.  Pt states that she was not coming to the ED but her husband made her.  Pt reports fever on and off.  Reports the pain in chest is like pressure and is not able to put on a bra because it makes the pain worse.  Pt also reports h/a.

## 2015-10-20 ENCOUNTER — Ambulatory Visit: Payer: Medicare Other

## 2015-10-20 DIAGNOSIS — F310 Bipolar disorder, current episode hypomanic: Secondary | ICD-10-CM | POA: Diagnosis not present

## 2015-10-20 DIAGNOSIS — F411 Generalized anxiety disorder: Secondary | ICD-10-CM

## 2015-10-20 NOTE — BH Specialist Note (Signed)
Danielle [name] talked about taking her daughter to the emergency room earlier this week due to her having a temperature of 103. We also updated her [facility] file and renewed her treatment plan.  This process led me to discover that she has daily anxiety and occasional panic-like symptoms.  She also has asthma and she thinks the heart racing, etc. Is due to that.  Overall, she says she is doing fairly well.  Plan to meet again in 2 weeks. [name], LCSW

## 2015-10-25 ENCOUNTER — Other Ambulatory Visit: Payer: Self-pay | Admitting: Infectious Diseases

## 2015-10-25 DIAGNOSIS — B20 Human immunodeficiency virus [HIV] disease: Secondary | ICD-10-CM

## 2015-11-03 ENCOUNTER — Ambulatory Visit: Payer: Medicare Other

## 2015-11-03 DIAGNOSIS — F310 Bipolar disorder, current episode hypomanic: Secondary | ICD-10-CM | POA: Diagnosis not present

## 2015-11-03 DIAGNOSIS — F3160 Bipolar disorder, current episode mixed, unspecified: Secondary | ICD-10-CM

## 2015-11-03 NOTE — BH Specialist Note (Signed)
Alvern was in a pleasant mood today, but needed to talk about her frustrations with a lady at their church who seems to be rather controlling, trying to get E[place]olia's children involved in events there.  Corina said her children are taking honor classes through [employer] and [employer] and don't have time for bible study and other events, due to homework.  Otherwise, she seems to be stable at this time.  Plan to meet again in 2 weeks. [name], LCSW

## 2015-11-16 ENCOUNTER — Ambulatory Visit: Payer: Medicare Other

## 2015-11-16 DIAGNOSIS — F411 Generalized anxiety disorder: Secondary | ICD-10-CM | POA: Diagnosis not present

## 2015-11-16 DIAGNOSIS — F310 Bipolar disorder, current episode hypomanic: Secondary | ICD-10-CM | POA: Diagnosis not present

## 2015-11-16 DIAGNOSIS — F3160 Bipolar disorder, current episode mixed, unspecified: Secondary | ICD-10-CM

## 2015-11-16 NOTE — BH Specialist Note (Signed)
Danielle [name] was very talkative today, as she often is, talking about her frustration with her 43 year old daughter, who, according to her, is not being responsible with her money.  Danielle [name] spent much of her session explaining how her daughter had ordered nearly $300 worth of items from [place] (which [place] sells) before her first check had even come from her part time job. She seemed to need some validation, which I gave, that her daughter needed some guidance on spending (and saving) her money more wisely. Otherwise, she seems to be doing well.  Plan to meet again in 2 weeks. [name], LCSW

## 2015-11-24 ENCOUNTER — Other Ambulatory Visit: Payer: Self-pay | Admitting: Infectious Diseases

## 2015-11-24 DIAGNOSIS — B20 Human immunodeficiency virus [HIV] disease: Secondary | ICD-10-CM

## 2015-11-24 NOTE — Telephone Encounter (Signed)
Left message for the pt.  Pt needs Lab and MD appointments.  Lab appointment made for March 22 prior to appt with K. Shore.  Needs to make MD appt.

## 2015-11-30 ENCOUNTER — Ambulatory Visit: Payer: Medicare Other

## 2015-11-30 ENCOUNTER — Other Ambulatory Visit: Payer: Medicare Other

## 2015-11-30 DIAGNOSIS — B20 Human immunodeficiency virus [HIV] disease: Secondary | ICD-10-CM

## 2015-11-30 DIAGNOSIS — F310 Bipolar disorder, current episode hypomanic: Secondary | ICD-10-CM | POA: Diagnosis not present

## 2015-11-30 DIAGNOSIS — F411 Generalized anxiety disorder: Secondary | ICD-10-CM | POA: Diagnosis not present

## 2015-11-30 DIAGNOSIS — F3160 Bipolar disorder, current episode mixed, unspecified: Secondary | ICD-10-CM

## 2015-11-30 LAB — COMPREHENSIVE METABOLIC PANEL
ALBUMIN: 4.2 g/dL (ref 3.6–5.1)
ALK PHOS: 114 U/L (ref 33–115)
ALT: 11 U/L (ref 6–29)
AST: 16 U/L (ref 10–30)
BILIRUBIN TOTAL: 0.4 mg/dL (ref 0.2–1.2)
BUN: 11 mg/dL (ref 7–25)
CO2: 26 mmol/L (ref 20–31)
CREATININE: 1.13 mg/dL — AB (ref 0.50–1.10)
Calcium: 9.4 mg/dL (ref 8.6–10.2)
Chloride: 106 mmol/L (ref 98–110)
Glucose, Bld: 93 mg/dL (ref 65–99)
Potassium: 3.8 mmol/L (ref 3.5–5.3)
SODIUM: 140 mmol/L (ref 135–146)
TOTAL PROTEIN: 7.1 g/dL (ref 6.1–8.1)

## 2015-11-30 LAB — CBC
HCT: 33.8 % — ABNORMAL LOW (ref 36.0–46.0)
HEMOGLOBIN: 10.4 g/dL — AB (ref 12.0–15.0)
MCH: 23.8 pg — ABNORMAL LOW (ref 26.0–34.0)
MCHC: 30.8 g/dL (ref 30.0–36.0)
MCV: 77.3 fL — ABNORMAL LOW (ref 78.0–100.0)
MPV: 10.5 fL (ref 8.6–12.4)
Platelets: 280 10*3/uL (ref 150–400)
RBC: 4.37 MIL/uL (ref 3.87–5.11)
RDW: 16.4 % — ABNORMAL HIGH (ref 11.5–15.5)
WBC: 4.1 10*3/uL (ref 4.0–10.5)

## 2015-11-30 NOTE — BH Specialist Note (Signed)
Danielle [name] was in a pleasant mood today, but needed to vent about her son, who came home with an F in [place].  She said he is really upset about it too, so she is trying to get in touch with his teacher to find out what they can do to help him bring that grade up.  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  Overall, she reports that she is currently stable and was able to smile and laugh some today.  Plan to meet again in 2 weeks. Danielle Spice, LCSW

## 2015-12-01 DIAGNOSIS — B20 Human immunodeficiency virus [HIV] disease: Secondary | ICD-10-CM | POA: Diagnosis not present

## 2015-12-01 LAB — HIV-1 RNA QUANT-NO REFLEX-BLD

## 2015-12-01 LAB — T-HELPER CELL (CD4) - (RCID CLINIC ONLY)
CD4 T CELL ABS: 630 /uL (ref 400–2700)
CD4 T CELL HELPER: 30 % — AB (ref 33–55)

## 2015-12-14 ENCOUNTER — Ambulatory Visit (INDEPENDENT_AMBULATORY_CARE_PROVIDER_SITE_OTHER): Payer: Medicare Other | Admitting: Infectious Diseases

## 2015-12-14 ENCOUNTER — Encounter: Payer: Self-pay | Admitting: Infectious Diseases

## 2015-12-14 ENCOUNTER — Ambulatory Visit: Payer: Medicare Other

## 2015-12-14 VITALS — BP 113/78 | HR 78 | Temp 98.7°F | Wt 165.0 lb

## 2015-12-14 DIAGNOSIS — F310 Bipolar disorder, current episode hypomanic: Secondary | ICD-10-CM | POA: Diagnosis not present

## 2015-12-14 DIAGNOSIS — M545 Low back pain, unspecified: Secondary | ICD-10-CM

## 2015-12-14 DIAGNOSIS — B20 Human immunodeficiency virus [HIV] disease: Secondary | ICD-10-CM

## 2015-12-14 DIAGNOSIS — Z79899 Other long term (current) drug therapy: Secondary | ICD-10-CM | POA: Diagnosis not present

## 2015-12-14 DIAGNOSIS — F411 Generalized anxiety disorder: Secondary | ICD-10-CM | POA: Diagnosis not present

## 2015-12-14 DIAGNOSIS — F3160 Bipolar disorder, current episode mixed, unspecified: Secondary | ICD-10-CM

## 2015-12-14 MED ORDER — CYCLOBENZAPRINE HCL 10 MG PO TABS: 10.0000 mg | ORAL_TABLET | Freq: Three times a day (TID) | ORAL | Status: DC | PRN

## 2015-12-14 MED ORDER — EMTRICITABINE-TENOFOVIR AF 200-25 MG PO TABS: 1.0000 | ORAL_TABLET | Freq: Every day | ORAL | Status: DC

## 2015-12-14 NOTE — Assessment & Plan Note (Signed)
Not clear why she is having back/muscle pain.  ? Muscle, nerve inury with surgery?  Will give her trial of muslce relaxer to trial.

## 2015-12-14 NOTE — Progress Notes (Signed)
   Subjective:    Patient ID: Danielle [name], female    DOB: 14-Feb-1971, 45 y.o.   MRN: [id number]  HPI Danielle [name] is a 45 year old female diagnosed with HIV.  Additionally, she has a history of GERD and abnormal uterine bleed.  Patient was previously on ATVr/TRV then swtiched to DTGV/TRV.  Patient denies any problems receiving her medications and any side effects related to her ART regimen.  Patient states she is unsure if she missed a dose last week.  It is possible that she took the medication, but didn't remember taking it.  Or should could have missed it.  Patient is doing well today and is in clinic for routine follow-up for her HIV.  Review of Systems  Constitutional: Negative for fever and appetite change.  HENT: Negative for mouth sores.   Respiratory: Negative for cough and shortness of breath.   Cardiovascular: Negative for chest pain and leg swelling.  Gastrointestinal: Negative for diarrhea and constipation. Abdominal pain: onset during bowel movements; otherwise, no ab. pain.  Genitourinary: Negative for dysuria, vaginal bleeding, vaginal discharge and vaginal pain.  Skin: Negative for rash.  Neurological: Negative for headaches.      Objective:   Physical Exam  Constitutional: She is oriented to person, place, and time. She appears well-developed and well-nourished.  HENT:  Mouth/Throat: No oropharyngeal exudate.  Eyes: Conjunctivae are normal. Pupils are equal, round, and reactive to light.  Cardiovascular: Normal rate, regular rhythm and normal heart sounds.   No murmur heard. Pulmonary/Chest: Effort normal and breath sounds normal. She has no wheezes.  Abdominal: Soft. Bowel sounds are normal. There is no tenderness.  Lymphadenopathy:    She has no cervical adenopathy.  Neurological: She is alert and oriented to person, place, and time.  Psychiatric: She has a normal mood and affect.      Assessment & Plan:

## 2015-12-14 NOTE — Assessment & Plan Note (Signed)
Has f/u with Grayland Ormond today

## 2015-12-14 NOTE — BH Specialist Note (Signed)
Danielle [name] reported that she talked to Dr. Johnnye Sima today about back pain she is having when she has a bowel movement, secondary to a recent partial hysterectomy.  She said he prescribed a muscle relaxer for her. Then she spent the rest of the session discussing an issue she and her husband are having with her 43 year old daughter - who has been communicating with a 43 year old female in [place], via social media ([place]?).  She told her husband not to challenge the lesbian aspect of this and I agreed with her, but that their biggest problem with it was the person's age. She said they took her phone away from her, but told her she can have a "social life", but not to use social media anymore. I reminded Danielle [name] that "this too shall pass".  Plan to meet again in 2 weeks. [name], LCSW

## 2015-12-14 NOTE — Addendum Note (Signed)
Addended by: Kelia Gibbon C on: 12/14/2015 11:46 AM   Modules accepted: Orders

## 2015-12-14 NOTE — Progress Notes (Signed)
   Subjective:    Patient ID: Danielle [name], female    DOB: [birth year], 45 y.o.   MRN: [id number]  HPI 45 yo F with HIV+ on DTGV/TRV. Doing well.  Has been having back pain since having partial abd hysterectomy. Worsened by BM. She has been given vico-profen with some relief.   HIV 1 RNA QUANT (copies/mL)  Date Value  11/30/2015 <20  12/22/2014 <20  06/22/2014 <20   CD4 T CELL ABS (/uL)  Date Value  11/30/2015 630  12/21/2014 670  06/22/2014 690     Review of Systems Please see NP note.     Objective:   Physical Exam  Constitutional: She appears well-developed and well-nourished.  HENT:  Mouth/Throat: No oropharyngeal exudate.  Pulmonary/Chest: Effort normal and breath sounds normal.  Abdominal: Soft. Bowel sounds are normal. There is no tenderness. There is no rebound.       Assessment & Plan:

## 2015-12-14 NOTE — Assessment & Plan Note (Addendum)
She is doing well. Provided patient with a pill box. Discussed the using the Care[employer]Today app to track her medication administration. Check RPR, lipids, gc/chlamydia with next visit.  Change TRV to [place].  RTC in 6 months.

## 2015-12-28 ENCOUNTER — Other Ambulatory Visit: Payer: Self-pay | Admitting: *Deleted

## 2015-12-28 ENCOUNTER — Ambulatory Visit: Payer: Medicare Other

## 2015-12-28 DIAGNOSIS — F310 Bipolar disorder, current episode hypomanic: Secondary | ICD-10-CM | POA: Diagnosis not present

## 2015-12-28 DIAGNOSIS — F411 Generalized anxiety disorder: Secondary | ICD-10-CM | POA: Diagnosis not present

## 2015-12-28 DIAGNOSIS — F3160 Bipolar disorder, current episode mixed, unspecified: Secondary | ICD-10-CM

## 2015-12-28 DIAGNOSIS — K219 Gastro-esophageal reflux disease without esophagitis: Secondary | ICD-10-CM

## 2015-12-28 MED ORDER — RANITIDINE HCL 150 MG PO TABS: ORAL_TABLET | ORAL | Status: DC

## 2015-12-28 NOTE — BH Specialist Note (Signed)
Danielle [name] went into a story of how she has been having to help a neighbor's child get to school and also look after another friend's 43 year old girl.  She talked about how frustrated she got yesterday when they missed the child's bus and she had to take him to school herself.  She also talked about her son, who's blood work showed low platelets and low white cell count. He has had low temperatures and she is concerned about what is causing this. Overall, she is doing well despite these stressors.  Plan to meet again in 2 weeks. Danielle Spice, LCSW

## 2016-01-11 ENCOUNTER — Ambulatory Visit: Payer: Medicare Other

## 2016-01-19 DIAGNOSIS — M545 Low back pain: Secondary | ICD-10-CM | POA: Diagnosis not present

## 2016-01-19 DIAGNOSIS — K5641 Fecal impaction: Secondary | ICD-10-CM | POA: Diagnosis not present

## 2016-01-25 ENCOUNTER — Ambulatory Visit: Payer: Medicare Other

## 2016-01-25 DIAGNOSIS — F411 Generalized anxiety disorder: Secondary | ICD-10-CM | POA: Diagnosis not present

## 2016-01-25 DIAGNOSIS — F3160 Bipolar disorder, current episode mixed, unspecified: Secondary | ICD-10-CM

## 2016-01-25 DIAGNOSIS — F310 Bipolar disorder, current episode hypomanic: Secondary | ICD-10-CM | POA: Diagnosis not present

## 2016-01-25 NOTE — BH Specialist Note (Signed)
Danielle [name] was in a pleasant mood today and talked about a "mother/son" dance she attended at her church recently.  She talked about how much fun she had, but then had sore feet the next couple of days. She reports that things are going well and has no complaints about her medications. She was late arriving, so her session was shorter than usual.  Plan to meet again in 2 weeks. [name], LCSW

## 2016-01-27 ENCOUNTER — Other Ambulatory Visit: Payer: Self-pay | Admitting: Infectious Diseases

## 2016-02-07 ENCOUNTER — Ambulatory Visit: Payer: Medicare Other

## 2016-02-07 DIAGNOSIS — F411 Generalized anxiety disorder: Secondary | ICD-10-CM | POA: Diagnosis not present

## 2016-02-07 DIAGNOSIS — F3160 Bipolar disorder, current episode mixed, unspecified: Secondary | ICD-10-CM

## 2016-02-07 DIAGNOSIS — F310 Bipolar disorder, current episode hypomanic: Secondary | ICD-10-CM | POA: Diagnosis not present

## 2016-02-07 NOTE — BH Specialist Note (Signed)
Danielle [name] was very animated today, reporting that she called the police on her 43 year old daughter.  She said her daughter had been texting at night instead of sleeping and had had her phone taken away, but snuck and got it back.  She said she was texting and calling an online friend in [place], telling this girl all of her family business.  She also had contacted her biological father.  When [place] read the texts, she found out that the girl had talked about coming to [place].  She said the police officer who came to the house was very good at talking to her daughter about how dangerous it was to be talking with someone online, because they may not be who they say they are.  I helped her process her emotions about all of this.  We also completed a treatment plan update in the [facility].  Plan to meet again in 2 weeks. [name], LCSW

## 2016-02-21 ENCOUNTER — Ambulatory Visit: Payer: Medicare Other

## 2016-02-21 DIAGNOSIS — F3160 Bipolar disorder, current episode mixed, unspecified: Secondary | ICD-10-CM

## 2016-02-21 DIAGNOSIS — F411 Generalized anxiety disorder: Secondary | ICD-10-CM | POA: Diagnosis not present

## 2016-02-21 NOTE — BH Specialist Note (Signed)
Danielle [name] spent the session today processing about her 43 year old daughter, who was recently contacted by her biological father and has been somewhat emotional about it. I encouraged her to contact [place] so that her daughter could have someone to talk to about all of this. She agreed to do this. Danielle [name] got upset with the man when he told her daughter he was her father.  I asked "what was he supposed to do?"  [place] basically wanted to control the whole thing, but had not told her she was adopted.  Overall, Danielle [name] seems to be handling it better this week than in her last session.  Plan to meet again in 2 weeks. [name], LCSW

## 2016-03-06 ENCOUNTER — Ambulatory Visit: Payer: Medicare Other

## 2016-03-15 ENCOUNTER — Ambulatory Visit: Payer: Medicare Other

## 2016-04-05 ENCOUNTER — Ambulatory Visit: Payer: Medicare Other

## 2016-04-05 ENCOUNTER — Telehealth: Payer: Self-pay

## 2016-04-05 ENCOUNTER — Other Ambulatory Visit: Payer: Self-pay | Admitting: *Deleted

## 2016-04-05 DIAGNOSIS — F411 Generalized anxiety disorder: Secondary | ICD-10-CM | POA: Diagnosis not present

## 2016-04-05 DIAGNOSIS — F3160 Bipolar disorder, current episode mixed, unspecified: Secondary | ICD-10-CM

## 2016-04-05 NOTE — Telephone Encounter (Signed)
Patient walked into clinic wanting to be seen about her right foot. Complained of right foot swelling from her ankles to her toes and there being an intermitten throbbing sensation present. No complaints of pain in legs and every now and then pain in right foot only.  Patient had used an ACE bandage. Explained back in 2003 on her birthday, daughter had jokingly tripped her and her right foot swelled the next day. This birthday, she was just walking in the park and her right foot begin to swell again.   Explained to patient there was not a doctor in clinic to see her. Asked patient if she had  contacted her primary care provider. She explained it was hard trying to get in touch with them ever since she cancelled her appointment on the 21st due to her not getting the back Xray that was ordered. Suggested to patient to go in person to make an appointment. Also, suggested to patient that if she could not get progress at her care provider to go to urgent care to be seen. Patient states she did not want to go anywhere and wait for a long time. Explained that might be the case depending on how full urgent care is. Patient stated understanding and will call and go to her primary care first and use a crutch she was given before on days her foot hurts until appointment. Patient thanked nurse and  walked out of clinic.

## 2016-04-05 NOTE — BH Specialist Note (Signed)
Danielle [name] talked about how her rent didn't get paid on time and they were given an eviction notice.  She said she contacted [place] and was given some financial assistance and it sounds like they will not be evicted. She managed to stay calm during all of this stress and told the story in a matter of fact way. She also reported that she is tired of her children being at home and is looking forward to them returning to school. Overall, she appears to be stable, despite her situation.  Plan to meet again in 2 weeks. [name], LCSW

## 2016-04-05 NOTE — Telephone Encounter (Signed)
Received fax requesting refill of valacyclovir. Seen in our office 07/2015 last.

## 2016-04-06 MED ORDER — VALACYCLOVIR HCL 500 MG PO TABS: 500.0000 mg | ORAL_TABLET | Freq: Every day | ORAL | 6 refills | Status: DC

## 2016-04-13 DIAGNOSIS — G8929 Other chronic pain: Secondary | ICD-10-CM | POA: Diagnosis not present

## 2016-04-19 ENCOUNTER — Ambulatory Visit: Payer: Medicare Other

## 2016-04-19 DIAGNOSIS — F3160 Bipolar disorder, current episode mixed, unspecified: Secondary | ICD-10-CM

## 2016-04-19 DIAGNOSIS — F411 Generalized anxiety disorder: Secondary | ICD-10-CM | POA: Diagnosis not present
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
## 2016-04-23 DIAGNOSIS — M5136 Other intervertebral disc degeneration, lumbar region: Secondary | ICD-10-CM | POA: Diagnosis not present

## 2016-05-03 ENCOUNTER — Ambulatory Visit: Payer: Medicare Other

## 2016-05-18 DIAGNOSIS — G894 Chronic pain syndrome: Secondary | ICD-10-CM | POA: Diagnosis not present

## 2016-05-29 ENCOUNTER — Other Ambulatory Visit: Payer: Self-pay

## 2016-05-29 DIAGNOSIS — Z1231 Encounter for screening mammogram for malignant neoplasm of breast: Secondary | ICD-10-CM

## 2016-06-05 ENCOUNTER — Ambulatory Visit: Payer: Medicare Other

## 2016-06-13 ENCOUNTER — Other Ambulatory Visit: Payer: Self-pay | Admitting: Infectious Diseases

## 2016-06-13 ENCOUNTER — Ambulatory Visit (INDEPENDENT_AMBULATORY_CARE_PROVIDER_SITE_OTHER): Payer: Medicare Other | Admitting: Infectious Diseases

## 2016-06-13 ENCOUNTER — Other Ambulatory Visit (HOSPITAL_COMMUNITY)
Admission: RE | Admit: 2016-06-13 | Discharge: 2016-06-13 | Disposition: A | Discharge status: Home / Self Care | Payer: Medicare Other | Source: Ambulatory Visit | Attending: Infectious Diseases | Admitting: Infectious Diseases

## 2016-06-13 ENCOUNTER — Encounter: Payer: Self-pay | Admitting: Infectious Diseases

## 2016-06-13 VITALS — BP 118/79 | HR 85 | Temp 98.1°F | Ht 61.0 in | Wt 154.0 lb

## 2016-06-13 DIAGNOSIS — N3942 Incontinence without sensory awareness: Secondary | ICD-10-CM | POA: Diagnosis not present

## 2016-06-13 DIAGNOSIS — Z113 Encounter for screening for infections with a predominantly sexual mode of transmission: Secondary | ICD-10-CM | POA: Insufficient documentation

## 2016-06-13 DIAGNOSIS — Z7969 Long term (current) use of other immunomodulators and immunosuppressants: Secondary | ICD-10-CM

## 2016-06-13 DIAGNOSIS — B20 Human immunodeficiency virus [HIV] disease: Secondary | ICD-10-CM | POA: Diagnosis not present

## 2016-06-13 DIAGNOSIS — M67441 Ganglion, right hand: Secondary | ICD-10-CM

## 2016-06-13 DIAGNOSIS — Z79899 Other long term (current) drug therapy: Secondary | ICD-10-CM | POA: Diagnosis not present

## 2016-06-13 DIAGNOSIS — Z0000 Encounter for general adult medical examination without abnormal findings: Secondary | ICD-10-CM

## 2016-06-13 LAB — CBC
HCT: 37 % (ref 35.0–45.0)
HEMOGLOBIN: 11.9 g/dL (ref 11.7–15.5)
MCH: 27.3 pg (ref 27.0–33.0)
MCHC: 32.2 g/dL (ref 32.0–36.0)
MCV: 84.9 fL (ref 80.0–100.0)
MPV: 10.3 fL (ref 7.5–12.5)
PLATELETS: 247 10*3/uL (ref 140–400)
RBC: 4.36 MIL/uL (ref 3.80–5.10)
RDW: 16.6 % — ABNORMAL HIGH (ref 11.0–15.0)
WBC: 3.8 10*3/uL (ref 3.8–10.8)

## 2016-06-13 LAB — COMPREHENSIVE METABOLIC PANEL
ALBUMIN: 4.2 g/dL (ref 3.6–5.1)
ALT: 8 U/L (ref 6–29)
AST: 13 U/L (ref 10–30)
Alkaline Phosphatase: 86 U/L (ref 33–115)
BILIRUBIN TOTAL: 0.3 mg/dL (ref 0.2–1.2)
BUN: 14 mg/dL (ref 7–25)
CHLORIDE: 105 mmol/L (ref 98–110)
CO2: 21 mmol/L (ref 20–31)
CREATININE: 1.18 mg/dL — AB (ref 0.50–1.10)
Calcium: 9.5 mg/dL (ref 8.6–10.2)
GLUCOSE: 85 mg/dL (ref 65–99)
Potassium: 4.1 mmol/L (ref 3.5–5.3)
SODIUM: 139 mmol/L (ref 135–146)
Total Protein: 7 g/dL (ref 6.1–8.1)

## 2016-06-13 LAB — LIPID PANEL
CHOL/HDL RATIO: 3.6 ratio (ref <=5.0)
Cholesterol: 197 mg/dL (ref 125–200)
HDL: 54 mg/dL (ref >=46)
LDL Cholesterol: 122 mg/dL (ref <130)
Triglycerides: 103 mg/dL (ref <150)
VLDL: 21 mg/dL (ref <30)

## 2016-06-13 NOTE — Assessment & Plan Note (Signed)
Will have her seen by hand.

## 2016-06-13 NOTE — Progress Notes (Signed)
   Subjective:    Patient ID: Danielle [name], female    DOB: [birth year], 45 y.o.   MRN: [id number]  HPI 45 yo F HIV+.  Additionally, she has a history of GERD and abnormal uterine bleed.   Had prev mammogram 07-2015 that showed 3 R fibro-adenomas.  Also having back pain and had MRI of her back which has not been followed up.  Also was to see uro for toviaz for frequency of urine. Has not had this appt.  Has swollen area on her R palm, tender to touch. Was seen by hand and surgery was defered. Still waiting for f/u appt. Getting larger.  Her PCP left.  Patient was previously on ATVr/TRV then swtiched to tivicay-descovy.   HIV 1 RNA Quant (copies/mL)  Date Value  11/30/2015 <20  12/22/2014 <20  06/22/2014 <20   CD4 T Cell Abs (/uL)  Date Value  11/30/2015 630  12/21/2014 670  06/22/2014 690    Review of Systems  Constitutional: Negative for appetite change and unexpected weight change.  Gastrointestinal: Negative for constipation and diarrhea.  Genitourinary: Positive for frequency.       Objective:   Physical Exam  Constitutional: She appears well-developed and well-nourished.  HENT:  Mouth/Throat: No oropharyngeal exudate.  Eyes: EOM are normal. Pupils are equal, round, and reactive to light.  Neck: Neck supple.  Cardiovascular: Normal rate, regular rhythm and normal heart sounds.   Pulmonary/Chest: Effort normal and breath sounds normal.  Abdominal: Soft. Bowel sounds are normal. There is no tenderness. There is no rebound.  Musculoskeletal: She exhibits no edema.  Lymphadenopathy:    She has no cervical adenopathy.      Assessment & Plan:

## 2016-06-13 NOTE — Assessment & Plan Note (Signed)
Her husband is (-), encouraged him to get tested.  She is doing well.  Will recheck her labs today Given condoms Refuses flu shot.  rtc in 6 months Will get her set up with PCP

## 2016-06-13 NOTE — Assessment & Plan Note (Signed)
Will have her seen by uro

## 2016-06-14 LAB — RPR

## 2016-06-14 LAB — URINE CYTOLOGY ANCILLARY ONLY
Chlamydia: NEGATIVE
NEISSERIA GONORRHEA: NEGATIVE

## 2016-06-14 LAB — T-HELPER CELL (CD4) - (RCID CLINIC ONLY)
CD4 % Helper T Cell: 30 % — ABNORMAL LOW (ref 33–55)
CD4 T Cell Abs: 590 /uL (ref 400–2700)

## 2016-06-14 LAB — HIV-1 RNA QUANT-NO REFLEX-BLD
HIV 1 RNA Quant: <20 copies/mL (ref <20)
HIV-1 RNA Quant, Log: <1.3 Log copies/mL (ref <1.30)

## 2016-06-20 ENCOUNTER — Ambulatory Visit: Payer: Medicare Other

## 2016-06-20 DIAGNOSIS — F3160 Bipolar disorder, current episode mixed, unspecified: Secondary | ICD-10-CM

## 2016-06-20 DIAGNOSIS — F411 Generalized anxiety disorder: Secondary | ICD-10-CM | POA: Diagnosis not present
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
## 2016-07-04 ENCOUNTER — Ambulatory Visit: Payer: Medicare Other

## 2016-07-04 DIAGNOSIS — F3160 Bipolar disorder, current episode mixed, unspecified: Secondary | ICD-10-CM

## 2016-07-04 DIAGNOSIS — F411 Generalized anxiety disorder: Secondary | ICD-10-CM | POA: Diagnosis not present
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
## 2016-07-05 DIAGNOSIS — N3941 Urge incontinence: Secondary | ICD-10-CM | POA: Diagnosis not present

## 2016-07-05 DIAGNOSIS — N3944 Nocturnal enuresis: Secondary | ICD-10-CM | POA: Diagnosis not present

## 2016-07-18 ENCOUNTER — Ambulatory Visit: Payer: Medicare Other

## 2016-07-18 DIAGNOSIS — F411 Generalized anxiety disorder: Secondary | ICD-10-CM | POA: Diagnosis not present

## 2016-07-18 DIAGNOSIS — F3160 Bipolar disorder, current episode mixed, unspecified: Secondary | ICD-10-CM

## 2016-07-18 NOTE — BH Specialist Note (Signed)
Danielle [name] reported that today is her daughter's 17th birthday and she talked about all the things she is doing for her, including braiding her hair, getting her nails done, etc. 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 She enjoys doing this, though sometimes the children don't like it because it hurts their scalp. Overall, she seems to be doing well at this time. Plan to meet again in 2 weeks. [name], LCSW

## 2016-07-26 DIAGNOSIS — M67441 Ganglion, right hand: Secondary | ICD-10-CM | POA: Diagnosis not present

## 2016-07-30 ENCOUNTER — Other Ambulatory Visit: Payer: Self-pay | Admitting: Infectious Diseases

## 2016-07-30 ENCOUNTER — Ambulatory Visit: Payer: Medicare Other

## 2016-07-30 DIAGNOSIS — F3160 Bipolar disorder, current episode mixed, unspecified: Secondary | ICD-10-CM

## 2016-07-30 DIAGNOSIS — K219 Gastro-esophageal reflux disease without esophagitis: Secondary | ICD-10-CM

## 2016-07-30 DIAGNOSIS — B20 Human immunodeficiency virus [HIV] disease: Secondary | ICD-10-CM

## 2016-07-30 NOTE — BH Specialist Note (Signed)
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  However, the mom calls her at the last minute and she has had to explain to her that she needs at least 24 hour notice.  Otherwise, Danielle [name] seems to be doing well and is stable.  Plan to meet in 2 weeks. [name], LCSW

## 2016-08-07 ENCOUNTER — Emergency Department (HOSPITAL_COMMUNITY): Payer: Medicare Other

## 2016-08-07 ENCOUNTER — Emergency Department (HOSPITAL_COMMUNITY)
Admission: EM | Admit: 2016-08-07 | Discharge: 2016-08-07 | Disposition: A | Discharge status: Home / Self Care | Payer: Medicare Other | Attending: Emergency Medicine | Admitting: Emergency Medicine

## 2016-08-07 ENCOUNTER — Encounter (HOSPITAL_COMMUNITY): Payer: Self-pay | Admitting: *Deleted

## 2016-08-07 DIAGNOSIS — W19XXXA Unspecified fall, initial encounter: Secondary | ICD-10-CM | POA: Insufficient documentation

## 2016-08-07 DIAGNOSIS — R072 Precordial pain: Secondary | ICD-10-CM | POA: Insufficient documentation

## 2016-08-07 DIAGNOSIS — Y999 Unspecified external cause status: Secondary | ICD-10-CM | POA: Diagnosis not present

## 2016-08-07 DIAGNOSIS — Z79899 Other long term (current) drug therapy: Secondary | ICD-10-CM | POA: Diagnosis not present

## 2016-08-07 DIAGNOSIS — Y929 Unspecified place or not applicable: Secondary | ICD-10-CM | POA: Diagnosis not present

## 2016-08-07 DIAGNOSIS — J45909 Unspecified asthma, uncomplicated: Secondary | ICD-10-CM | POA: Insufficient documentation

## 2016-08-07 DIAGNOSIS — R0789 Other chest pain: Secondary | ICD-10-CM | POA: Diagnosis present

## 2016-08-07 DIAGNOSIS — R0782 Intercostal pain: Secondary | ICD-10-CM

## 2016-08-07 DIAGNOSIS — Y939 Activity, unspecified: Secondary | ICD-10-CM | POA: Diagnosis not present

## 2016-08-07 DIAGNOSIS — R079 Chest pain, unspecified: Secondary | ICD-10-CM | POA: Diagnosis not present

## 2016-08-07 DIAGNOSIS — Z91040 Latex allergy status: Secondary | ICD-10-CM | POA: Diagnosis not present

## 2016-08-07 LAB — BASIC METABOLIC PANEL
ANION GAP: 6 (ref 5–15)
BUN: 10 mg/dL (ref 6–20)
CALCIUM: 9.4 mg/dL (ref 8.9–10.3)
CO2: 27 mmol/L (ref 22–32)
Chloride: 108 mmol/L (ref 101–111)
Creatinine, Ser: 0.99 mg/dL (ref 0.44–1.00)
GFR calc Af Amer: >60 mL/min (ref >60)
GLUCOSE: 103 mg/dL — AB (ref 65–99)
POTASSIUM: 3.8 mmol/L (ref 3.5–5.1)
SODIUM: 141 mmol/L (ref 135–145)

## 2016-08-07 LAB — CBC
HCT: 35.8 % — ABNORMAL LOW (ref 36.0–46.0)
HEMOGLOBIN: 11.5 g/dL — AB (ref 12.0–15.0)
MCH: 27.8 pg (ref 26.0–34.0)
MCHC: 32.1 g/dL (ref 30.0–36.0)
MCV: 86.5 fL (ref 78.0–100.0)
Platelets: 221 10*3/uL (ref 150–400)
RBC: 4.14 MIL/uL (ref 3.87–5.11)
RDW: 15.9 % — AB (ref 11.5–15.5)
WBC: 4.2 10*3/uL (ref 4.0–10.5)

## 2016-08-07 LAB — I-STAT TROPONIN, ED: TROPONIN I, POC: 0 ng/mL (ref 0.00–0.08)

## 2016-08-07 MED ORDER — NAPROXEN 250 MG PO TABS
500.0000 mg | ORAL_TABLET | Freq: Once | ORAL | Status: AC
Start: 2016-08-07 — End: 2016-08-07
  Administered 2016-08-07: 500 mg via ORAL
  Filled 2016-08-07: qty 2

## 2016-08-07 NOTE — Discharge Planning (Signed)
Pt up for discharge. EDCM reviewed chart for possible CM needs.  No needs identified or communicated.  

## 2016-08-07 NOTE — ED Triage Notes (Signed)
Pt reports having syncopal episode at church on Sunday. Hit back of head on ground. Having mid chest pain since yesterday. Pain increases with movement and breathing. ekg done at triage.

## 2016-08-07 NOTE — ED Provider Notes (Signed)
[facility] DEPT Provider Note   CSN: [id number] Arrival date & time: 08/07/16  1154     History   Chief Complaint Chief Complaint  Patient presents with  . Fall  . Chest Pain    HPI Danielle [name] is a 45 y.o. female.  The history is provided by the patient.  Fall  This is a new problem. The current episode started 2 days ago. The problem occurs constantly. Associated symptoms include chest pain and headaches (after fall). Pertinent negatives include no abdominal pain and no shortness of breath. The symptoms are aggravated by coughing (and deep breathing). Nothing relieves the symptoms. She has tried nothing for the symptoms. The treatment provided no relief.  Chest Pain   Associated symptoms include headaches (after fall). Pertinent negatives include no abdominal pain, no back pain, no cough, no fever, no palpitations, no shortness of breath and no vomiting.  Pertinent negatives for past medical history include no seizures.    Past Medical History:  Diagnosis Date  . Anemia   . Anxiety   . Asthma   . Bulging lumbar disc   . Chronic back pain   . Depression   . Genital HSV   . GERD (gastroesophageal reflux disease)   . Hepatitis   . HIV infection ([place])   . Migraines   . Pneumonia     Patient Active Problem List   Diagnosis Date Noted  . S/P total abdominal hysterectomy 06/28/2015  . Abnormal uterine bleeding (AUB) 05/03/2015  . Condyloma acuminatum 02/23/2015  . Fibroids 01/05/2015  . Urinary incontinence 01/05/2015  . Abnormal finding on GI tract imaging 10/12/2013  . Anemia-chronic 10/09/2013  . Achalasia s/p laparoscopic Heller myotomy and Dor fundoplication [id number]  . Ganglion cyst of finger of right hand 07/06/2013  . Trichomoniasis of vagina 06/03/2013  . GERD (gastroesophageal reflux disease) 03/16/2013  . Depression 02/10/2009  . Human immunodeficiency virus (HIV) disease ([place]) 03/24/2007  . ANXIETY 03/24/2007  . ASTHMA 03/24/2007     Past Surgical History:  Procedure Laterality Date  . ABDOMINAL HYSTERECTOMY N/A 06/28/2015   Procedure: HYSTERECTOMY ABDOMINAL;  Surgeon: Osborne Oman, MD;  Location: [facility];  Service: Gynecology;  Laterality: N/A;  Requested 06/28/15 @ 7:30a  . BILATERAL SALPINGECTOMY Bilateral 06/28/2015   Procedure: BILATERAL SALPINGECTOMY;  Surgeon: Osborne Oman, MD;  Location: [facility];  Service: Gynecology;  Laterality: Bilateral;  . CESAREAN SECTION     twice;  vertical incision x 2  . COLONOSCOPY N/A 10/12/2013   Procedure: COLONOSCOPY;  Surgeon: Jerene Bears, MD;  Location: [facility];  Service: Endoscopy;  Laterality: N/A;  . ESOPHAGEAL MANOMETRY N/A 06/29/2013   Procedure: ESOPHAGEAL MANOMETRY (EM);  Surgeon: Jerene Bears, MD;  Location: [facility];  Service: Gastroenterology;  Laterality: N/A;  . HELLER MYOTOMY N/A 10/08/2013   Procedure: LAPAROSCOPIC HELLER MYOTOMY, DOR FUNDIPLICATION, UPPER ENDOSCOPY;  Surgeon: Odis Hollingshead, MD;  Location: [facility];  Service: General;  Laterality: N/A;  . HERNIA REPAIR    . HYSTEROSCOPY WITH RESECTOSCOPE N/A 05/03/2015   Procedure: Diagnostic Hysteroscopy;  Surgeon: Lavonia Drafts, MD;  Location: [facility];  Service: Gynecology;  Laterality: N/A;  wants Novasure and myosure.  Marland Kitchen LYSIS OF ADHESION  06/28/2015   Procedure: LYSIS OF ADHESION;  Surgeon: Osborne Oman, MD;  Location: [facility];  Service: Gynecology;;  . OTHER SURGICAL HISTORY     biopsy axillary ,right arm  . right arm biopsy    . UPPER GI ENDOSCOPY N/A 10/08/2013  Procedure: UPPER GI ENDOSCOPY;  Surgeon: Odis Hollingshead, MD;  Location: [facility];  Service: General;  Laterality: N/A;  . WISDOM TOOTH EXTRACTION      OB History    Gravida Para Term Preterm AB Living   5 3 3   2 3    SAB TAB Ectopic Multiple Live Births   2               Home Medications    Prior to Admission medications   Medication Sig Start Date End Date Taking? Authorizing Provider  cyclobenzaprine  (FLEXERIL) 10 MG tablet Take 1 tablet (10 mg total) by mouth 3 (three) times daily as needed for muscle spasms. 12/14/15   Campbell Riches, MD  emtricitabine-tenofovir AF (DESCOVY) 200-25 MG tablet Take 1 tablet by mouth daily. 12/14/15   Campbell Riches, MD  fesoterodine (TOVIAZ) 8 MG TB24 tablet Take 8 mg by mouth daily.    Historical Provider, MD  PROAIR HFA 108 (90 BASE) MCG/ACT inhaler Inhale 2 puffs into the lungs every 4 (four) hours as needed for wheezing or shortness of breath. Shortness of breath/wheezing 12/31/14   Historical Provider, MD  ranitidine (ZANTAC) 150 MG tablet TAKE 1 TABLET BY MOUTH EVERY NIGHT AT [id number] HOURS FROM REYETAZ) 07/30/16   Campbell Riches, MD  TIVICAY 50 MG tablet TAKE 1 TABLET BY MOUTH EVERY EVENING 07/30/16   Campbell Riches, MD  valACYclovir (VALTREX) 500 MG tablet Take 1 tablet (500 mg total) by mouth daily. 04/06/16   Guss Bunde, MD    Family History Family History  Problem Relation Age of Onset  . Hyperlipidemia Mother   . Diabetes Mother   . Stroke Mother   . Diabetes Brother   . Colon cancer Father   . Esophageal cancer Neg Hx   . Rectal cancer Neg Hx   . Stomach cancer Neg Hx     Social History Social History  Substance Use Topics  . Smoking status: Never Smoker  . Smokeless tobacco: Never Used  . Alcohol use No     Allergies   Latex   Review of Systems Review of Systems  Constitutional: Negative for chills and fever.  HENT: Negative for ear pain and sore throat.   Eyes: Negative for pain and visual disturbance.  Respiratory: Positive for chest tightness (chronic with asthma). Negative for cough and shortness of breath.   Cardiovascular: Positive for chest pain. Negative for palpitations.  Gastrointestinal: Negative for abdominal pain and vomiting.  Genitourinary: Negative for dysuria and hematuria.  Musculoskeletal: Negative for arthralgias and back pain.  Skin: Negative for color change and rash.  Neurological:  Positive for headaches (after fall). Negative for seizures and syncope.  All other systems reviewed and are negative.    Physical Exam Updated Vital Signs BP 113/67   Pulse 70   Temp 98.4 F (36.9 C) (Oral)   Resp 15   Ht 5\' 1"  (1.549 m)   Wt 71.7 kg   LMP 04/11/2015 (Approximate) Comment: unknown  SpO2 100%   BMI 29.85 kg/m   Physical Exam  Constitutional: She is oriented to person, place, and time. She appears well-developed and well-nourished. No distress.  HENT:  Head: Normocephalic and atraumatic.  Nose: Nose normal.  Mouth/Throat: No oropharyngeal exudate.  Eyes: Conjunctivae and EOM are normal. Pupils are equal, round, and reactive to light.  Neck: Normal range of motion. Neck supple.  Cardiovascular: Normal rate and regular rhythm.   No murmur heard. Pulmonary/Chest: Effort  normal and breath sounds normal. No respiratory distress. She has no wheezes. She has no rales.  Abdominal: Soft. Bowel sounds are normal. She exhibits no distension and no mass. There is no tenderness. There is no guarding.  Musculoskeletal: She exhibits no edema.  Neurological: She is alert and oriented to person, place, and time. No cranial nerve deficit or sensory deficit. She exhibits normal muscle tone. Coordination and gait normal.  No dysmetria no dysdiadochokinesia  Skin: Skin is warm and dry.  Psychiatric: She has a normal mood and affect.  Nursing note and vitals reviewed.    ED Treatments / Results  Labs (all labs ordered are listed, but only abnormal results are displayed) Labs Reviewed  BASIC METABOLIC PANEL - Abnormal; Notable for the following:       Result Value   Glucose, Bld 103 (*)    All other components within normal limits  CBC - Abnormal; Notable for the following:    Hemoglobin 11.5 (*)    HCT 35.8 (*)    RDW 15.9 (*)    All other components within normal limits  I-STAT TROPOININ, ED    EKG  EKG Interpretation  Date/Time:  Tuesday August 07 2016 11:59:36  EST Ventricular Rate:  76 PR Interval:  132 QRS Duration: 82 QT Interval:  380 QTC Calculation: 427 R Axis:   76 Text Interpretation:  Normal sinus rhythm Normal ECG No acute changes No significant change since last tracing Confirmed by Kathrynn Humble, MD, Thelma Comp [id number]) on 08/07/2016 2:00:29 PM       Radiology Dg Chest 2 View  Result Date: 08/07/2016 CLINICAL DATA:  Mid chest pain started yesterday ,,hx asthma EXAM: CHEST - 2 VIEW COMPARISON:  10/16/2015 FINDINGS: Lungs are clear. Heart size and mediastinal contours are within normal limits. No effusion. Visualized bones unremarkable. IMPRESSION: No acute cardiopulmonary disease. Electronically Signed   By: Lucrezia Europe M.D.   On: 08/07/2016 12:28    Procedures Procedures (including critical care time)  Medications Ordered in ED Medications  naproxen (NAPROSYN) tablet 500 mg (500 mg Oral Given 08/07/16 1349)     Initial Impression / Assessment and Plan / ED Course  I have reviewed the triage vital signs and the nursing notes.  Pertinent labs & imaging results that were available during my care of the patient were reviewed by me and considered in my medical decision making (see chart for details).  Clinical Course    45 year old female comes with chest pain. Worse with inspiration and coughing. It is diffusely across her whole chest. No trauma. No recent illness. No fevers no chills. Vital signs are stable she's afebrile. Of note she did fall backwards at church she may have had a syncopal episode she may have lost consciousness which the grandmother not sure but her total loss of consciousness was less than a minute and she is back to her normal self. No injury to the chest at bedtime. Normal neurologic exam today. No signs of basilar skull fracture. Mild headache and tenderness to palpation of the back of the head. Patient has tried nothing for the chest pain at home. Her EKG shows normal sinus rhythm without ischemia interval abnormality  or arrhythmia. Chest x-ray shows no acute cardiopulmonary pathology. No need for CT imaging of the head of she may have had a mild concussion but otherwise symptom-free. First troponin is negative. CBC BMP are unremarkable for any acute changes contributing to the patient's presentation. Patient likely has intercostal chest wall pain. Is reducible with  palpation. She is given NSAIDs and told to follow-up with her primary care provider. Vital signs stable time a discharge. Strict return precautions are given.  Final Clinical Impressions(s) / ED Diagnoses   Final diagnoses:  Intercostal pain    New Prescriptions New Prescriptions   No medications on file     Dewaine Conger, MD 08/07/16 [place], MD 08/08/16 2029

## 2016-08-10 DIAGNOSIS — N3941 Urge incontinence: Secondary | ICD-10-CM | POA: Diagnosis not present

## 2016-08-10 DIAGNOSIS — N3944 Nocturnal enuresis: Secondary | ICD-10-CM | POA: Diagnosis not present

## 2016-08-14 ENCOUNTER — Ambulatory Visit: Payer: Medicare Other

## 2016-08-14 DIAGNOSIS — F411 Generalized anxiety disorder: Secondary | ICD-10-CM | POA: Diagnosis not present

## 2016-08-14 DIAGNOSIS — F3160 Bipolar disorder, current episode mixed, unspecified: Secondary | ICD-10-CM
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
## 2016-08-24 ENCOUNTER — Other Ambulatory Visit: Payer: Self-pay

## 2016-08-24 DIAGNOSIS — M67441 Ganglion, right hand: Secondary | ICD-10-CM | POA: Diagnosis not present

## 2016-08-24 DIAGNOSIS — M7989 Other specified soft tissue disorders: Secondary | ICD-10-CM | POA: Diagnosis not present

## 2016-09-10 HISTORY — PX: FOREIGN BODY REMOVAL: SHX962

## 2016-09-17 ENCOUNTER — Ambulatory Visit: Payer: Medicare Other

## 2016-09-19 DIAGNOSIS — R350 Frequency of micturition: Secondary | ICD-10-CM | POA: Diagnosis not present

## 2016-09-19 DIAGNOSIS — N3944 Nocturnal enuresis: Secondary | ICD-10-CM | POA: Diagnosis not present

## 2016-09-24 ENCOUNTER — Ambulatory Visit: Payer: Medicare Other

## 2016-09-27 ENCOUNTER — Ambulatory Visit: Payer: Medicare Other

## 2016-10-01 DIAGNOSIS — F419 Anxiety disorder, unspecified: Secondary | ICD-10-CM | POA: Diagnosis not present

## 2016-10-01 DIAGNOSIS — F329 Major depressive disorder, single episode, unspecified: Secondary | ICD-10-CM | POA: Diagnosis not present

## 2016-10-02 ENCOUNTER — Other Ambulatory Visit: Payer: Self-pay | Admitting: *Deleted

## 2016-10-09 ENCOUNTER — Ambulatory Visit: Payer: Medicare Other

## 2016-10-09 DIAGNOSIS — F310 Bipolar disorder, current episode hypomanic: Secondary | ICD-10-CM | POA: Diagnosis not present

## 2016-10-09 DIAGNOSIS — F3160 Bipolar disorder, current episode mixed, unspecified: Secondary | ICD-10-CM

## 2016-10-09 DIAGNOSIS — F411 Generalized anxiety disorder: Secondary | ICD-10-CM | POA: Diagnosis not present

## 2016-10-09 NOTE — BH Specialist Note (Signed)
Danielle [name] talked about having hand surgery recently. She also needed to process how something that happened at her church when she bought a book for the nursery and how it became a bigger thing than she anticipated. We updated her treatment plan with [facility] with goals of maintaining stability with her Bipolar Disorder and lowering her baseline anxiety.  Plan to meet again in 2 weeks. [name], LCSW

## 2016-10-15 DIAGNOSIS — J45909 Unspecified asthma, uncomplicated: Secondary | ICD-10-CM | POA: Diagnosis not present

## 2016-10-15 DIAGNOSIS — G629 Polyneuropathy, unspecified: Secondary | ICD-10-CM | POA: Diagnosis not present

## 2016-10-15 DIAGNOSIS — N63 Unspecified lump in unspecified breast: Secondary | ICD-10-CM | POA: Diagnosis not present

## 2016-10-15 DIAGNOSIS — L309 Dermatitis, unspecified: Secondary | ICD-10-CM | POA: Diagnosis not present

## 2016-10-15 DIAGNOSIS — R413 Other amnesia: Secondary | ICD-10-CM | POA: Diagnosis not present

## 2016-10-15 DIAGNOSIS — M545 Low back pain: Secondary | ICD-10-CM | POA: Diagnosis not present

## 2016-10-24 ENCOUNTER — Ambulatory Visit: Payer: Medicare Other

## 2016-10-24 DIAGNOSIS — F310 Bipolar disorder, current episode hypomanic: Secondary | ICD-10-CM | POA: Diagnosis not present

## 2016-10-24 DIAGNOSIS — F3160 Bipolar disorder, current episode mixed, unspecified: Secondary | ICD-10-CM

## 2016-10-24 DIAGNOSIS — F411 Generalized anxiety disorder: Secondary | ICD-10-CM | POA: Diagnosis not present

## 2016-10-24 NOTE — BH Specialist Note (Signed)
Danielle [name] reported that she was sick with a fever last week, but isn't sure if it was the flu or not. She said the nurse at her primary care doctor's office screened her for depression and said it seemed she had some depressed mood and anxiety. We talked some about this and she said she covers it up with smiles much of the time. Plan to meet again in 2 weeks. [name], LCSW

## 2016-11-07 ENCOUNTER — Ambulatory Visit: Payer: Medicare Other

## 2016-11-07 DIAGNOSIS — F3160 Bipolar disorder, current episode mixed, unspecified: Secondary | ICD-10-CM

## 2016-11-07 DIAGNOSIS — F310 Bipolar disorder, current episode hypomanic: Secondary | ICD-10-CM | POA: Diagnosis not present

## 2016-11-07 NOTE — BH Specialist Note (Signed)
Danielle [name] talked about some of her frustrations with parenting and dealing with her son's teachers about his behavior. She feels that the teachers are too lenient at times, allowing him to listen to music in some classes. We also discussed her psychiatric medication management. She has gone to [facility] in the past with mixed results. I told her about the [facility] program. Plan to meet again in 2 weeks. [name], LCSW

## 2016-11-12 ENCOUNTER — Other Ambulatory Visit: Payer: Self-pay | Admitting: Obstetrics & Gynecology

## 2016-11-21 ENCOUNTER — Ambulatory Visit: Payer: Medicare Other

## 2016-11-21 DIAGNOSIS — F310 Bipolar disorder, current episode hypomanic: Secondary | ICD-10-CM | POA: Diagnosis not present

## 2016-11-21 DIAGNOSIS — F411 Generalized anxiety disorder: Secondary | ICD-10-CM | POA: Diagnosis not present

## 2016-11-21 DIAGNOSIS — F3160 Bipolar disorder, current episode mixed, unspecified: Secondary | ICD-10-CM

## 2016-11-21 NOTE — BH Specialist Note (Signed)
Danielle [name] talked about parenting issues today. We also completed some paperwork for [facility]. I helped her problem solve and process emotions re: parenting issues. She is stable at this time. Plan to meet again in 2 weeks. [name], LCSW

## 2016-12-05 ENCOUNTER — Ambulatory Visit: Payer: Medicare Other

## 2016-12-05 DIAGNOSIS — F310 Bipolar disorder, current episode hypomanic: Secondary | ICD-10-CM | POA: Diagnosis not present

## 2016-12-05 DIAGNOSIS — F3160 Bipolar disorder, current episode mixed, unspecified: Secondary | ICD-10-CM

## 2016-12-05 DIAGNOSIS — F411 Generalized anxiety disorder: Secondary | ICD-10-CM | POA: Diagnosis not present

## 2016-12-05 NOTE — BH Specialist Note (Signed)
Laela was in a pleasant mood today, talking about her children and some of the issues of parenting teenagers. She is especially concerned about her son, who seems curious about gangs and talks about them. She and her husband have talked to him about this, she said. Otherwise, she seems to be stable at this time. Plan to meet again in 2 weeks. [name], LCSW

## 2016-12-12 ENCOUNTER — Emergency Department (HOSPITAL_COMMUNITY)
Admission: EM | Admit: 2016-12-12 | Discharge: 2016-12-12 | Disposition: A | Discharge status: Home / Self Care | Payer: Medicare Other | Attending: Emergency Medicine | Admitting: Emergency Medicine

## 2016-12-12 ENCOUNTER — Ambulatory Visit: Payer: Medicare Other | Admitting: Infectious Diseases

## 2016-12-12 ENCOUNTER — Encounter (HOSPITAL_COMMUNITY): Payer: Self-pay | Admitting: Emergency Medicine

## 2016-12-12 DIAGNOSIS — Z91040 Latex allergy status: Secondary | ICD-10-CM | POA: Insufficient documentation

## 2016-12-12 DIAGNOSIS — J45909 Unspecified asthma, uncomplicated: Secondary | ICD-10-CM | POA: Insufficient documentation

## 2016-12-12 DIAGNOSIS — Z79899 Other long term (current) drug therapy: Secondary | ICD-10-CM | POA: Diagnosis not present

## 2016-12-12 DIAGNOSIS — J111 Influenza due to unidentified influenza virus with other respiratory manifestations: Secondary | ICD-10-CM | POA: Insufficient documentation

## 2016-12-12 DIAGNOSIS — R69 Illness, unspecified: Secondary | ICD-10-CM

## 2016-12-12 LAB — CBC WITH DIFFERENTIAL/PLATELET
BASOS PCT: 1 %
Basophils Absolute: 0 10*3/uL (ref 0.0–0.1)
EOS ABS: 0.1 10*3/uL (ref 0.0–0.7)
Eosinophils Relative: 2 %
HCT: 37.4 % (ref 36.0–46.0)
HEMOGLOBIN: 12 g/dL (ref 12.0–15.0)
LYMPHS ABS: 1.3 10*3/uL (ref 0.7–4.0)
Lymphocytes Relative: 33 %
MCH: 28 pg (ref 26.0–34.0)
MCHC: 32.1 g/dL (ref 30.0–36.0)
MCV: 87.4 fL (ref 78.0–100.0)
Monocytes Absolute: 0.7 10*3/uL (ref 0.1–1.0)
Monocytes Relative: 17 %
NEUTROS PCT: 47 %
Neutro Abs: 1.9 10*3/uL (ref 1.7–7.7)
Platelets: 208 10*3/uL (ref 150–400)
RBC: 4.28 MIL/uL (ref 3.87–5.11)
RDW: 15.4 % (ref 11.5–15.5)
WBC: 3.9 10*3/uL — AB (ref 4.0–10.5)

## 2016-12-12 LAB — COMPREHENSIVE METABOLIC PANEL
ALT: 15 U/L (ref 14–54)
AST: 22 U/L (ref 15–41)
Albumin: 3.7 g/dL (ref 3.5–5.0)
Alkaline Phosphatase: 91 U/L (ref 38–126)
Anion gap: 8 (ref 5–15)
BILIRUBIN TOTAL: 0.4 mg/dL (ref 0.3–1.2)
BUN: 9 mg/dL (ref 6–20)
CHLORIDE: 104 mmol/L (ref 101–111)
CO2: 21 mmol/L — ABNORMAL LOW (ref 22–32)
CREATININE: 1.12 mg/dL — AB (ref 0.44–1.00)
Calcium: 8.7 mg/dL — ABNORMAL LOW (ref 8.9–10.3)
GFR, EST NON AFRICAN AMERICAN: 59 mL/min — AB (ref >60)
Glucose, Bld: 86 mg/dL (ref 65–99)
Potassium: 3.6 mmol/L (ref 3.5–5.1)
Sodium: 133 mmol/L — ABNORMAL LOW (ref 135–145)
TOTAL PROTEIN: 6.8 g/dL (ref 6.5–8.1)

## 2016-12-12 LAB — I-STAT CG4 LACTIC ACID, ED: LACTIC ACID, VENOUS: 0.65 mmol/L (ref 0.5–1.9)

## 2016-12-12 NOTE — ED Notes (Signed)
Pt states she has had body aches, fever, chills, and sore throat that began yesterday.  Denies N/V/D.

## 2016-12-12 NOTE — ED Provider Notes (Signed)
[facility] DEPT Provider Note   CSN: [id number] Arrival date & time: 12/12/16  1841  By signing my name below, I, Neta Mends, attest that this documentation has been prepared under the direction and in the presence of Fatima Blank, MD . Electronically Signed: Neta Mends, ED Scribe. 12/12/2016. 7:57 PM.    History   Chief Complaint Chief Complaint  Patient presents with  . Influenza   The history is provided by the patient. No language interpreter was used.   HPI Comments:  Danielle [name] is a 46 y.o. female who presents to the Emergency Department complaining of constant generalized body aches since yesterday. Pt reports that she had a temperature at home of 100.2, and her temperature has been fluctuating. Pt complains of associated sore throat, dry cough. Pt has taken Advil that reduced her temperature slightly. Pt did not get a flu shot this year. Denies rhinorrhea, congestion, nausea, vomiting, diarrhea, abdominal pain, dysuria. Endorses likely sick contacts at home and at work.   Past Medical History:  Diagnosis Date  . Anemia   . Anxiety   . Asthma   . Bulging lumbar disc   . Chronic back pain   . Depression   . Genital HSV   . GERD (gastroesophageal reflux disease)   . Hepatitis   . HIV infection ([place])   . Migraines   . Pneumonia     Patient Active Problem List   Diagnosis Date Noted  . S/P total abdominal hysterectomy 06/28/2015  . Abnormal uterine bleeding (AUB) 05/03/2015  . Condyloma acuminatum 02/23/2015  . Fibroids 01/05/2015  . Urinary incontinence 01/05/2015  . Abnormal finding on GI tract imaging 10/12/2013  . Anemia-chronic 10/09/2013  . 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 Ganglion cyst of finger of right hand 07/06/2013  . Trichomoniasis of vagina 06/03/2013  . GERD (gastroesophageal reflux disease) 03/16/2013  . Depression 02/10/2009  . Human immunodeficiency virus (HIV)  disease ([place]) 03/24/2007  . ANXIETY 03/24/2007  . ASTHMA 03/24/2007    Past Surgical History:  Procedure Laterality Date  . ABDOMINAL HYSTERECTOMY N/A 06/28/2015   Procedure: HYSTERECTOMY ABDOMINAL;  Surgeon: Osborne Oman, MD;  Location: [facility];  Service: Gynecology;  Laterality: N/A;  Requested 06/28/15 @ 7:30a  . BILATERAL SALPINGECTOMY Bilateral 06/28/2015   Procedure: BILATERAL SALPINGECTOMY;  Surgeon: Osborne Oman, MD;  Location: [facility];  Service: Gynecology;  Laterality: Bilateral;  . CESAREAN SECTION     twice;  vertical incision x 2  . COLONOSCOPY N/A 10/12/2013   Procedure: COLONOSCOPY;  Surgeon: Jerene Bears, MD;  Location: [facility];  Service: Endoscopy;  Laterality: N/A;  . ESOPHAGEAL MANOMETRY N/A 06/29/2013   Procedure: ESOPHAGEAL MANOMETRY (EM);  Surgeon: Jerene Bears, MD;  Location: [facility];  Service: Gastroenterology;  Laterality: N/A;  . HELLER MYOTOMY N/A 10/08/2013   Procedure: LAPAROSCOPIC HELLER MYOTOMY, DOR FUNDIPLICATION, UPPER ENDOSCOPY;  Surgeon: Odis Hollingshead, MD;  Location: [facility];  Service: General;  Laterality: N/A;  . HERNIA REPAIR    . HYSTEROSCOPY WITH RESECTOSCOPE N/A 05/03/2015   Procedure: Diagnostic Hysteroscopy;  Surgeon: Lavonia Drafts, MD;  Location: [facility];  Service: Gynecology;  Laterality: N/A;  wants Novasure and myosure.  Marland Kitchen LYSIS OF ADHESION  06/28/2015   Procedure: LYSIS OF ADHESION;  Surgeon: Osborne Oman, MD;  Location: [facility];  Service: Gynecology;;  . OTHER SURGICAL HISTORY     biopsy axillary ,right arm  . right arm biopsy    .  UPPER GI ENDOSCOPY N/A 10/08/2013   Procedure: UPPER GI ENDOSCOPY;  Surgeon: Odis Hollingshead, MD;  Location: [facility];  Service: General;  Laterality: N/A;  . WISDOM TOOTH EXTRACTION      OB History    Gravida Para Term Preterm AB Living   5 3 3   2 3    SAB TAB Ectopic Multiple Live Births   2               Home Medications    Prior to Admission medications   Medication  Sig Start Date End Date Taking? Authorizing Provider  cyclobenzaprine (FLEXERIL) 10 MG tablet Take 1 tablet (10 mg total) by mouth 3 (three) times daily as needed for muscle spasms. 12/14/15   Campbell Riches, MD  emtricitabine-tenofovir AF (DESCOVY) 200-25 MG tablet Take 1 tablet by mouth daily. 12/14/15   Campbell Riches, MD  fesoterodine (TOVIAZ) 8 MG TB24 tablet Take 8 mg by mouth daily.    Historical Provider, MD  PROAIR HFA 108 (90 BASE) MCG/ACT inhaler Inhale 2 puffs into the lungs every 4 (four) hours as needed for wheezing or shortness of breath. Shortness of breath/wheezing 12/31/14   Historical Provider, MD  ranitidine (ZANTAC) 150 MG tablet TAKE 1 TABLET BY MOUTH EVERY NIGHT AT [id number](67 HOURS FROM REYETAZ) 07/30/16   Campbell Riches, MD  TIVICAY 50 MG tablet TAKE 1 TABLET BY MOUTH EVERY EVENING 07/30/16   Campbell Riches, MD  valACYclovir (VALTREX) 500 MG tablet TAKE 1 TABLET(500 MG) BY MOUTH DAILY 11/12/16   Guss Bunde, MD    Family History Family History  Problem Relation Age of Onset  . Hyperlipidemia Mother   . Diabetes Mother   . Stroke Mother   . Diabetes Brother   . Colon cancer Father   . Esophageal cancer Neg Hx   . Rectal cancer Neg Hx   . Stomach cancer Neg Hx     Social History Social History  Substance Use Topics  . Smoking status: Never Smoker  . Smokeless tobacco: Never Used  . Alcohol use No     Allergies   Latex   Review of Systems Review of Systems 10 Systems reviewed and are negative for acute change except as noted in the HPI.   Physical Exam Updated Vital Signs BP 130/89   Pulse 97   Temp 99.9 F (37.7 C)   Resp 16   LMP  (LMP Unknown)   SpO2 97%   Physical Exam  Constitutional: She is oriented to person, place, and time. She appears well-developed and well-nourished. No distress.  HENT:  Head: Normocephalic and atraumatic.  Right Ear: Tympanic membrane normal.  Left Ear: Tympanic membrane is not injected, not  erythematous and not bulging. A middle ear effusion is present.  Nose: Nose normal.  Mouth/Throat: No oropharyngeal exudate, posterior oropharyngeal edema or posterior oropharyngeal erythema.  Post nasal drip  Eyes: Conjunctivae and EOM are normal. Pupils are equal, round, and reactive to light. Right eye exhibits no discharge. Left eye exhibits no discharge. No scleral icterus.  Neck: Normal range of motion. Neck supple.  Cardiovascular: Normal rate and regular rhythm.  Exam reveals no gallop and no friction rub.   No murmur heard. Pulmonary/Chest: Effort normal and breath sounds normal. No stridor. No respiratory distress. She has no wheezes. She has no rhonchi. She has no rales.  Abdominal: Soft. She exhibits no distension. There is no tenderness.  Musculoskeletal: She exhibits no edema or tenderness.  Neurological:  She is alert and oriented to person, place, and time.  Skin: Skin is warm and dry. No rash noted. She is not diaphoretic. No erythema.  Psychiatric: She has a normal mood and affect.  Vitals reviewed.    ED Treatments / Results  DIAGNOSTIC STUDIES:  Oxygen Saturation is 97% on RA, normal by my interpretation.    COORDINATION OF CARE:  7:51 PM Discussed treatment plan with pt at bedside and pt agreed to plan.   Labs (all labs ordered are listed, but only abnormal results are displayed) Labs Reviewed  COMPREHENSIVE METABOLIC PANEL - Abnormal; Notable for the following:       Result Value   Sodium 133 (*)    CO2 21 (*)    Creatinine, Ser 1.12 (*)    Calcium 8.7 (*)    GFR calc non Af Amer 59 (*)    All other components within normal limits  CBC WITH DIFFERENTIAL/PLATELET - Abnormal; Notable for the following:    WBC 3.9 (*)    All other components within normal limits  I-STAT CG4 LACTIC ACID, ED    EKG  EKG Interpretation None       Radiology No results found.  Procedures Procedures (including critical care time)  Medications Ordered in  ED Medications - No data to display   Initial Impression / Assessment and Plan / ED Course  I have reviewed the triage vital signs and the nursing notes.  Pertinent labs & imaging results that were available during my care of the patient were reviewed by me and considered in my medical decision making (see chart for details).     46 y.o. female presents with flu-like symptoms for 2 days. appropriate oral hydration. Rest of history as above.  Patient appears well. No signs of toxicity, patient is interactive. No hypoxia, tachypnea or other signs of respiratory distress. No sign of clinical dehydration. Lung exam clear. Rest of exam as above.  Most consistent with flu-like illness   No evidence suggestive of pharyngitis, AOM, PNA, or meningitis.  Chest x-ray not indicated at this time.  Discussed tamiflu and pt declined.  Discussed symptomatic treatment with the patient and they will follow closely with their PCP.    Final Clinical Impressions(s) / ED Diagnoses   Final diagnoses:  Influenza-like illness   Disposition: Discharge  Condition: Good  I have discussed the results, Dx and Tx plan with the patient who expressed understanding and agree(s) with the plan. Discharge instructions discussed at great length. The patient was given strict return precautions who verbalized understanding of the instructions. No further questions at time of discharge.    New Prescriptions   No medications on file    Follow Up: primary care provider  Schedule an appointment as soon as possible for a visit  in 5-7 days, If symptoms do not improve or  worsen   I personally performed the services described in this documentation, which was scribed in my presence. The recorded information has been reviewed and is accurate.        Fatima Blank, MD 12/12/16 2026

## 2016-12-12 NOTE — ED Triage Notes (Signed)
Pt reports fever of 100.2, body aches and sore throat at home since yesterday, taking advil for pain/fever. resp e/u, nad. Pt playing on phone during triage.

## 2016-12-14 ENCOUNTER — Other Ambulatory Visit: Payer: Self-pay | Admitting: Obstetrics & Gynecology

## 2016-12-17 ENCOUNTER — Ambulatory Visit: Payer: Medicare Other

## 2016-12-21 ENCOUNTER — Ambulatory Visit (HOSPITAL_COMMUNITY)
Admission: EM | Admit: 2016-12-21 | Discharge: 2016-12-21 | Disposition: A | Discharge status: Home / Self Care | Payer: Medicare Other | Attending: Family Medicine | Admitting: Family Medicine

## 2016-12-21 ENCOUNTER — Encounter (HOSPITAL_COMMUNITY): Payer: Self-pay | Admitting: Family Medicine

## 2016-12-21 ENCOUNTER — Ambulatory Visit (INDEPENDENT_AMBULATORY_CARE_PROVIDER_SITE_OTHER): Payer: Medicare Other

## 2016-12-21 DIAGNOSIS — R05 Cough: Secondary | ICD-10-CM | POA: Diagnosis not present

## 2016-12-21 DIAGNOSIS — R079 Chest pain, unspecified: Secondary | ICD-10-CM | POA: Diagnosis not present

## 2016-12-21 DIAGNOSIS — J189 Pneumonia, unspecified organism: Secondary | ICD-10-CM

## 2016-12-21 DIAGNOSIS — J181 Lobar pneumonia, unspecified organism: Secondary | ICD-10-CM

## 2016-12-21 MED ORDER — AZITHROMYCIN 250 MG PO TABS: ORAL_TABLET | ORAL | 0 refills | Status: DC

## 2016-12-21 MED ORDER — HYDROCODONE-HOMATROPINE 5-1.5 MG/5ML PO SYRP: 5.0000 mL | ORAL_SOLUTION | Freq: Four times a day (QID) | ORAL | 0 refills | Status: DC | PRN

## 2016-12-21 NOTE — ED Provider Notes (Signed)
[facility]    CSN: [id number] Arrival date & time: 12/21/16  1310     History   Chief Complaint Chief Complaint  Patient presents with  . Cough    HPI Danielle [name] is a 46 y.o. female with a history of HIV (last CD4 590, undetectable VL) presenting for cough.   She reports about 11 days of persistent, nonproductive cough that has increased in frequency and is becoming associated with sore throat and headache. Theraflu and other OTC remedies have not helped. She denies chest pain and reports intermittent dyspnea which is improved with albuterol - none currently. She feels chills but has not checked her temperature. She was seen on 4/4 in the ED for these symptoms, told she likely had flu but was out of tamiflu window. She returns today because symptoms have not improved as expected.     HPI  Past Medical History:  Diagnosis Date  . Anemia   . Anxiety   . Asthma   . Bulging lumbar disc   . Chronic back pain   . Depression   . Genital HSV   . GERD (gastroesophageal reflux disease)   . Hepatitis   . HIV infection ([place])   . Migraines   . Pneumonia     Patient Active Problem List   Diagnosis Date Noted  . S/P total abdominal hysterectomy 06/28/2015  . Abnormal uterine bleeding (AUB) 05/03/2015  . Condyloma acuminatum 02/23/2015  . Fibroids 01/05/2015  . Urinary incontinence 01/05/2015  . Abnormal finding on GI tract imaging 10/12/2013  . Anemia-chronic 10/09/2013  . Achalasia s/p laparoscopic Heller myotomy and Dor fundoplication 09/12/7251  . Ganglion cyst of finger of right hand 07/06/2013  . Trichomoniasis of vagina 06/03/2013  . GERD (gastroesophageal reflux disease) 03/16/2013  . Depression 02/10/2009  . Human immunodeficiency virus (HIV) disease ([place]) 03/24/2007  . ANXIETY 03/24/2007  . ASTHMA 03/24/2007    Past Surgical History:  Procedure Laterality Date  . ABDOMINAL HYSTERECTOMY N/A 06/28/2015   Procedure: HYSTERECTOMY ABDOMINAL;   Surgeon: Osborne Oman, MD;  Location: [facility];  Service: Gynecology;  Laterality: N/A;  Requested 06/28/15 @ 7:30a  . BILATERAL SALPINGECTOMY Bilateral 06/28/2015   Procedure: BILATERAL SALPINGECTOMY;  Surgeon: Osborne Oman, MD;  Location: [facility];  Service: Gynecology;  Laterality: Bilateral;  . CESAREAN SECTION     twice;  vertical incision x 2  . COLONOSCOPY N/A 10/12/2013   Procedure: COLONOSCOPY;  Surgeon: Jerene Bears, MD;  [facility];  Service: Endoscopy;  Laterality: N/A;  . ESOPHAGEAL MANOMETRY N/A 06/29/2013   Procedure: ESOPHAGEAL MANOMETRY (EM);  Surgeon: Jerene Bears, MD;  [facility];  Service: Gastroenterology;  Laterality: N/A;  . HELLER MYOTOMY N/A 10/08/2013   Procedure: LAPAROSCOPIC HELLER MYOTOMY, DOR FUNDIPLICATION, UPPER ENDOSCOPY;  Surgeon: Odis Hollingshead, MD;  Location: [facility];  Service: General;  Laterality: N/A;  . HERNIA REPAIR    . HYSTEROSCOPY WITH RESECTOSCOPE N/A 05/03/2015   Procedure: Diagnostic Hysteroscopy;  Surgeon: Lavonia Drafts, MD;  Location: [facility];  Service: Gynecology;  Laterality: N/A;  wants Novasure and myosure.  Marland Kitchen LYSIS OF ADHESION  06/28/2015   Procedure: LYSIS OF ADHESION;  Surgeon: Osborne Oman, MD;  Location: [facility];  Service: Gynecology;;  . OTHER SURGICAL HISTORY     biopsy axillary ,right arm  . right arm biopsy    . UPPER GI ENDOSCOPY N/A 10/08/2013   Procedure: UPPER GI ENDOSCOPY;  Surgeon: Odis Hollingshead, MD;  Location:  [facility];  Service: General;  Laterality: N/A;  . WISDOM TOOTH EXTRACTION      OB History    Gravida Para Term Preterm AB Living   5 3 3   2 3    SAB TAB Ectopic Multiple Live Births   2               Home Medications    Prior to Admission medications   Medication Sig Start Date End Date Taking? Authorizing Provider  azithromycin (ZITHROMAX) 250 MG tablet Take 2 tablets on day 1, followed by 1 tab daily x4 days 12/21/16   Patrecia Pour, MD  cyclobenzaprine (FLEXERIL) 10  MG tablet Take 1 tablet (10 mg total) by mouth 3 (three) times daily as needed for muscle spasms. 12/14/15   Campbell Riches, MD  emtricitabine-tenofovir AF (DESCOVY) 200-25 MG tablet Take 1 tablet by mouth daily. 12/14/15   Campbell Riches, MD  fesoterodine (TOVIAZ) 8 MG TB24 tablet Take 8 mg by mouth daily.    Historical Provider, MD  PROAIR HFA 108 (90 BASE) MCG/ACT inhaler Inhale 2 puffs into the lungs every 4 (four) hours as needed for wheezing or shortness of breath. Shortness of breath/wheezing 12/31/14   Historical Provider, MD  ranitidine (ZANTAC) 150 MG tablet TAKE 1 TABLET BY MOUTH EVERY NIGHT AT [id number](65 HOURS FROM REYETAZ) 07/30/16   Campbell Riches, MD  TIVICAY 50 MG tablet TAKE 1 TABLET BY MOUTH EVERY EVENING 07/30/16   Campbell Riches, MD  valACYclovir (VALTREX) 500 MG tablet TAKE 1 TABLET(500 MG) BY MOUTH DAILY 11/12/16   Guss Bunde, MD    Family History Family History  Problem Relation Age of Onset  . Hyperlipidemia Mother   . Diabetes Mother   . Stroke Mother   . Diabetes Brother   . Colon cancer Father   . Esophageal cancer Neg Hx   . Rectal cancer Neg Hx   . Stomach cancer Neg Hx     Social History Social History  Substance Use Topics  . Smoking status: Never Smoker  . Smokeless tobacco: Never Used  . Alcohol use No     Allergies   Latex   Review of Systems Review of Systems As above.   Physical Exam Triage Vital Signs ED Triage Vitals [12/21/16 1328]  Enc Vitals Group     BP (!) 146/92     Pulse Rate 99     Resp 18     Temp 99 F (37.2 C)     Temp src      SpO2 98 %     Weight      Height      Head Circumference      Peak Flow      Pain Score 10     Pain Loc      Pain Edu?      Excl. in [place]?    No data found.   Updated Vital Signs BP (!) 146/92   Pulse 99   Temp 99 F (37.2 C)   Resp 18   LMP  (LMP Unknown)   SpO2 98%   Visual Acuity Right Eye Distance:   Left Eye Distance:   Bilateral Distance:    Right Eye  Near:   Left Eye Near:    Bilateral Near:     Physical Exam  Constitutional: She is oriented to person, place, and time. She appears well-developed and well-nourished. No distress.  HENT:  Head: Normocephalic and atraumatic.  Right Ear: External ear normal.  Left Ear: External ear normal.  Nose: Nose normal.  erythematous orophaynx with enlargement of tonsils without exudates. No lymphadenopathy.  Eyes: Conjunctivae are normal.  Neck: Neck supple.  Cardiovascular: Normal rate and regular rhythm.   No murmur heard. Pulmonary/Chest: Effort normal. No respiratory distress. She has no wheezes. She has no rales.  Frequent hacking nonproductive cough.  Abdominal: Soft. There is no tenderness.  Musculoskeletal: She exhibits no edema.  Lymphadenopathy:    She has no cervical adenopathy.  Neurological: She is alert and oriented to person, place, and time.  Skin: Skin is warm and dry. Capillary refill takes less than 2 seconds. No rash noted.  Psychiatric: She has a normal mood and affect.  Nursing note and vitals reviewed.    UC Treatments / Results  Labs (all labs ordered are listed, but only abnormal results are displayed) Labs Reviewed - No data to display  EKG  EKG Interpretation None       Radiology Dg Chest 2 View  Result Date: 12/21/2016 CLINICAL DATA:  Per pt: sick since 4/3 with bad cough, head pain from coughing, chest pain from coughing, head and chest congestion, throwing up mucus. EXAM: CHEST  2 VIEW COMPARISON:  08/07/2017 FINDINGS: Cardiomediastinal silhouette is stable. There is elevation of the left hemidiaphragm. Moderate gaseous distension of the stomach. No pulmonary edema. There is streaky right base medially atelectasis or early infiltrate. Early pneumonia cannot be excluded. Follow-up to resolution is recommended. There is elevation of the left hemidiaphragm. Moderate gaseous distension of the stomach. No pulmonary edema. There is streaky right base medially  atelectasis or early infiltrate. Early pneumonia cannot be excluded. Follow-up to resolution is recommended. Electronically Signed   By: Lahoma Crocker M.D.   On: 12/21/2016 14:48    Procedures Procedures (including critical care time)  Medications Ordered in UC Medications - No data to display   Initial Impression / Assessment and Plan / UC Course  I have reviewed the triage vital signs and the nursing notes.  Pertinent labs & imaging results that were available during my care of the patient were reviewed by me and considered in my medical decision making (see chart for details).  Final Clinical Impressions(s) / UC Diagnoses   Final diagnoses:  Community acquired pneumonia of right lower lobe of lung ([place])   46 y.o. female with well-controlled HIV presenting for prolonged cough following ILI. Currently day 11 of illness with ongoing URI symptoms. CXR performed and shows streaky atelectasis vs. infiltrate at the right base. No indication for escalation of care as the patient is non-toxic appearing without meningeal signs on exam or respiratory distress.  - Rx azithromycin x5 days. If no improvement, consider SLP evaluation and/or anaerobic coverage given its location and h/o esophageal myotomy for achalasia. - Continue supportive therapies - Return precautions advised  New Prescriptions New Prescriptions   AZITHROMYCIN (ZITHROMAX) 250 MG TABLET    Take 2 tablets on day 1, followed by 1 tab daily x4 days     Patrecia Pour, MD 12/21/16 1457

## 2016-12-21 NOTE — ED Triage Notes (Addendum)
Pt here for cough, headache. sts that she was seen in the ER over a week ago and told she had the flu but not better. sts she has been using her albuterol pump at times when needed.

## 2016-12-24 ENCOUNTER — Telehealth: Payer: Self-pay | Admitting: *Deleted

## 2016-12-24 NOTE — Telephone Encounter (Signed)
Requesting Valtrex refill from Dr. Johnnye Sima.  Previously filled by [facility] who is now requesting the [facility] manage the rx.

## 2016-12-24 NOTE — Telephone Encounter (Signed)
Per message from Dr. Gala Romney requested we refer valtrex refill request to ID due to patient HIV.  Message sent to Dr. Johnnye Sima and he request patient call office to request. I have notified patient to call ID to discuss either refill or different dosage as ID thinks appropriate. She voices understanding.

## 2016-12-25 NOTE — Telephone Encounter (Signed)
Ok to refill   thanks

## 2016-12-26 ENCOUNTER — Encounter (HOSPITAL_COMMUNITY): Payer: Self-pay | Admitting: Emergency Medicine

## 2016-12-26 ENCOUNTER — Ambulatory Visit (HOSPITAL_COMMUNITY)
Admission: EM | Admit: 2016-12-26 | Discharge: 2016-12-26 | Disposition: A | Discharge status: Home / Self Care | Payer: Medicare Other | Attending: Family Medicine | Admitting: Family Medicine

## 2016-12-26 ENCOUNTER — Ambulatory Visit (INDEPENDENT_AMBULATORY_CARE_PROVIDER_SITE_OTHER): Payer: Medicare Other

## 2016-12-26 DIAGNOSIS — R062 Wheezing: Secondary | ICD-10-CM

## 2016-12-26 DIAGNOSIS — R0602 Shortness of breath: Secondary | ICD-10-CM | POA: Diagnosis not present

## 2016-12-26 DIAGNOSIS — R05 Cough: Secondary | ICD-10-CM

## 2016-12-26 DIAGNOSIS — J40 Bronchitis, not specified as acute or chronic: Secondary | ICD-10-CM

## 2016-12-26 MED ORDER — VALACYCLOVIR HCL 500 MG PO TABS: ORAL_TABLET | ORAL | 5 refills | Status: DC

## 2016-12-26 MED ORDER — PREDNISONE 20 MG PO TABS: ORAL_TABLET | ORAL | 0 refills | Status: DC

## 2016-12-26 NOTE — ED Triage Notes (Signed)
The patient presented to the [facility] with a complaint of a follow up to a pneumonia diagnosis. She stated that the provider told her to return 4 days after starting her antibiotics. The patient stated that she still has some cough.

## 2016-12-26 NOTE — Addendum Note (Signed)
Addended by: Lorne Skeens D on: 12/26/2016 11:58 AM   Modules accepted: Orders

## 2016-12-26 NOTE — ED Provider Notes (Signed)
[place]    CSN: [id number] Arrival date & time: 12/26/16  1036     History   Chief Complaint Chief Complaint  Patient presents with  . Follow-up    HPI Danielle [name] is a 46 y.o. female.   The patient presented to the [facility] with a complaint of a follow up to a pneumonia diagnosis. She stated that the provider told her to return 4 days after starting her antibiotics. The patient stated that she still has some cough.   Patient has a history of HIV but her counts have been controlled. She is not working currently. She does have asthma and has been using her rescue inhaler regularly her symptoms are worse at night and first thing in the morning.      Past Medical History:  Diagnosis Date  . Anemia   . Anxiety   . Asthma   . Bulging lumbar disc   . Chronic back pain   . Depression   . Genital HSV   . GERD (gastroesophageal reflux disease)   . Hepatitis   . HIV infection ([place])   . Migraines   . Pneumonia     Patient Active Problem List   Diagnosis Date Noted  . S/P total abdominal hysterectomy 06/28/2015  . Abnormal uterine bleeding (AUB) 05/03/2015  . Condyloma acuminatum 02/23/2015  . Fibroids 01/05/2015  . Urinary incontinence 01/05/2015  . Abnormal finding on GI tract imaging 10/12/2013  . Anemia-chronic 10/09/2013  . Achalasia s/p laparoscopic Heller myotomy and Dor fundoplication [id number]/6834  . Ganglion cyst of finger of right hand 07/06/2013  . Trichomoniasis of vagina 06/03/2013  . GERD (gastroesophageal reflux disease) 03/16/2013  . Depression 02/10/2009  . Human immunodeficiency virus (HIV) disease ([place]) 03/24/2007  . ANXIETY 03/24/2007  . ASTHMA 03/24/2007    Past Surgical History:  Procedure Laterality Date  . ABDOMINAL HYSTERECTOMY N/A 06/28/2015   Procedure: HYSTERECTOMY ABDOMINAL;  Surgeon: Osborne Oman, MD;  Location: [facility];  Service: Gynecology;  Laterality: N/A;  Requested 06/28/15 @ 7:30a  . BILATERAL SALPINGECTOMY  Bilateral 06/28/2015   Procedure: BILATERAL SALPINGECTOMY;  Surgeon: Osborne Oman, MD;  Location: [facility];  Service: Gynecology;  Laterality: Bilateral;  . CESAREAN SECTION     twice;  vertical incision x 2  . COLONOSCOPY N/A 10/12/2013   Procedure: COLONOSCOPY;  Surgeon: Jerene Bears, MD;  Location: [facility];  Service: Endoscopy;  Laterality: N/A;  . ESOPHAGEAL MANOMETRY N/A 06/29/2013   Procedure: ESOPHAGEAL MANOMETRY (EM);  Surgeon: Jerene Bears, MD;  Location: [facility];  Service: Gastroenterology;  Laterality: N/A;  . HELLER MYOTOMY N/A 10/08/2013   Procedure: LAPAROSCOPIC HELLER MYOTOMY, DOR FUNDIPLICATION, UPPER ENDOSCOPY;  Surgeon: Odis Hollingshead, MD;  Location: [facility];  Service: General;  Laterality: N/A;  . HERNIA REPAIR    . HYSTEROSCOPY WITH RESECTOSCOPE N/A 05/03/2015   Procedure: Diagnostic Hysteroscopy;  Surgeon: Lavonia Drafts, MD;  Location: [facility];  Service: Gynecology;  Laterality: N/A;  wants Novasure and myosure.  Marland Kitchen LYSIS OF ADHESION  06/28/2015   Procedure: LYSIS OF ADHESION;  Surgeon: Osborne Oman, MD;  Location: [facility];  Service: Gynecology;;  . OTHER SURGICAL HISTORY     biopsy axillary ,right arm  . right arm biopsy    . UPPER GI ENDOSCOPY N/A 10/08/2013   Procedure: UPPER GI ENDOSCOPY;  Surgeon: Odis Hollingshead, MD;  Location: [facility];  Service: General;  Laterality: N/A;  . WISDOM TOOTH EXTRACTION  OB History    Gravida Para Term Preterm AB Living   5 3 3   2 3    SAB TAB Ectopic Multiple Live Births   2               Home Medications    Prior to Admission medications   Medication Sig Start Date End Date Taking? Authorizing Provider  emtricitabine-tenofovir AF (DESCOVY) 200-25 MG tablet Take 1 tablet by mouth daily. 12/14/15  Yes Campbell Riches, MD  fesoterodine (TOVIAZ) 8 MG TB24 tablet Take 8 mg by mouth daily.   Yes Historical Provider, MD  HYDROcodone-homatropine (HYCODAN) 5-1.5 MG/5ML syrup Take 5 mLs by mouth every 6  (six) hours as needed for cough. 12/21/16  Yes Patrecia Pour, MD  PROAIR HFA 108 [phone] BASE) MCG/ACT inhaler Inhale 2 puffs into the lungs every 4 (four) hours as needed for wheezing or shortness of breath. Shortness of breath/wheezing 12/31/14  Yes Historical Provider, MD  ranitidine (ZANTAC) 150 MG tablet TAKE 1 TABLET BY MOUTH EVERY NIGHT AT [id number](24 HOURS FROM REYETAZ) 07/30/16  Yes Campbell Riches, MD  TIVICAY 50 MG tablet TAKE 1 TABLET BY MOUTH EVERY EVENING 07/30/16  Yes Campbell Riches, MD  predniSONE (DELTASONE) 20 MG tablet Two daily with food 12/26/16   Robyn Haber, MD  valACYclovir (VALTREX) 500 MG tablet TAKE 1 TABLET(500 MG) BY MOUTH DAILY 12/26/16   Truman Hayward, MD    Family History Family History  Problem Relation Age of Onset  . Hyperlipidemia Mother   . Diabetes Mother   . Stroke Mother   . Diabetes Brother   . Colon cancer Father   . Esophageal cancer Neg Hx   . Rectal cancer Neg Hx   . Stomach cancer Neg Hx     Social History Social History  Substance Use Topics  . Smoking status: Never Smoker  . Smokeless tobacco: Never Used  . Alcohol use No     Allergies   Latex   Review of Systems Review of Systems  Constitutional: Positive for chills and diaphoresis.  HENT: Negative.   Respiratory: Positive for cough and wheezing.   Gastrointestinal: Negative.   Musculoskeletal: Negative.   Skin: Negative.   Neurological: Negative.      Physical Exam Triage Vital Signs ED Triage Vitals  Enc Vitals Group     BP 12/26/16 1125 121/65     Pulse Rate 12/26/16 1125 84     Resp 12/26/16 1125 18     Temp 12/26/16 1125 98.3 F (36.8 C)     Temp Source 12/26/16 1125 Oral     SpO2 12/26/16 1125 99 %     Weight --      Height --      Head Circumference --      Peak Flow --      Pain Score 12/26/16 1124 0     Pain Loc --      Pain Edu? --      Excl. in [place]? --    No data found.   Updated Vital Signs BP 121/65 (BP Location: Right Arm)   Pulse  84   Temp 98.3 F (36.8 C) (Oral)   Resp 18   LMP  (LMP Unknown)   SpO2 99%    Physical Exam  Constitutional: She is oriented to person, place, and time. She appears well-developed and well-nourished.  HENT:  Head: Normocephalic.  Right Ear: External ear normal.  Left Ear: External ear normal.  Mouth/Throat: Oropharynx is clear and moist.  Eyes: Conjunctivae are normal. Pupils are equal, round, and reactive to light.  Neck: Normal range of motion. Neck supple.  Cardiovascular: Normal rate, regular rhythm and normal heart sounds.   Pulmonary/Chest: Effort normal and breath sounds normal.  Musculoskeletal: Normal range of motion.  Neurological: She is alert and oriented to person, place, and time.  Skin: Skin is warm and dry.  Nursing note and vitals reviewed.    UC Treatments / Results  Labs (all labs ordered are listed, but only abnormal results are displayed) Labs Reviewed - No data to display  EKG  EKG Interpretation None       Radiology Some right paradiaphragmatic clearing Procedures Procedures (including critical care time)  Medications Ordered in UC Medications - No data to display   Initial Impression / Assessment and Plan / UC Course  I have reviewed the triage vital signs and the nursing notes.  Pertinent labs & imaging results that were available during my care of the patient were reviewed by me and considered in my medical decision making (see chart for details).     Final Clinical Impressions(s) / UC Diagnoses   Final diagnoses:  Bronchitis    New Prescriptions New Prescriptions   PREDNISONE (DELTASONE) 20 MG TABLET    Two daily with food     Robyn Haber, MD 12/26/16 1253

## 2016-12-26 NOTE — Telephone Encounter (Signed)
RN let the patient know that refill was approved.

## 2016-12-26 NOTE — Discharge Instructions (Signed)
There is no sign of pneumonia at this point. Instead you have ongoing bronchitis which should be helped by taking the prednisone as prescribed along with your inhaler.

## 2017-01-02 ENCOUNTER — Encounter (HOSPITAL_COMMUNITY): Payer: Self-pay | Admitting: Emergency Medicine

## 2017-01-02 ENCOUNTER — Ambulatory Visit (HOSPITAL_COMMUNITY)
Admission: EM | Admit: 2017-01-02 | Discharge: 2017-01-02 | Disposition: A | Discharge status: Home / Self Care | Payer: Medicare Other | Attending: Family Medicine | Admitting: Family Medicine

## 2017-01-02 DIAGNOSIS — J40 Bronchitis, not specified as acute or chronic: Secondary | ICD-10-CM

## 2017-01-02 DIAGNOSIS — R0789 Other chest pain: Secondary | ICD-10-CM

## 2017-01-02 DIAGNOSIS — G43109 Migraine with aura, not intractable, without status migrainosus: Secondary | ICD-10-CM

## 2017-01-02 DIAGNOSIS — R05 Cough: Secondary | ICD-10-CM

## 2017-01-02 DIAGNOSIS — R059 Cough, unspecified: Secondary | ICD-10-CM

## 2017-01-02 MED ORDER — BENZONATATE 100 MG PO CAPS: 200.0000 mg | ORAL_CAPSULE | Freq: Three times a day (TID) | ORAL | 0 refills | Status: DC | PRN

## 2017-01-02 MED ORDER — SUMATRIPTAN SUCCINATE 100 MG PO TABS: 100.0000 mg | ORAL_TABLET | ORAL | 0 refills | Status: DC | PRN

## 2017-01-02 MED ORDER — PREDNISONE 5 MG PO TABS: ORAL_TABLET | ORAL | 0 refills | Status: DC

## 2017-01-02 NOTE — ED Triage Notes (Signed)
Pt has been being treated for three weeks now for flu like symptoms, pna, and bronchitis.  Pt still complains of mid lower chest discomfort, right head pain and a cough.

## 2017-01-02 NOTE — ED Provider Notes (Signed)
CSN: [id number]     Arrival date & time 01/02/17  1002 History   None    Chief Complaint  Patient presents with  . Follow-up   (Consider location/radiation/quality/duration/timing/severity/associated sxs/prior Treatment) Patient c/o cough, wheezing, and uri sx's for 3 days.  He has been waking up at night with cough and wheezing and sx's are worse.  He does smoke.   The history is provided by the patient.  URI  Presenting symptoms: congestion, cough, fatigue and rhinorrhea   Severity:  Moderate Onset quality:  Sudden Duration:  3 weeks Timing:  Constant Progression:  Worsening Chronicity:  New Relieved by:  Nothing Worsened by:  Nothing Ineffective treatments:  None tried   Past Medical History:  Diagnosis Date  . Anemia   . Anxiety   . Asthma   . Bulging lumbar disc   . Chronic back pain   . Depression   . Genital HSV   . GERD (gastroesophageal reflux disease)   . Hepatitis   . HIV infection ([place])   . Migraines   . Pneumonia    Past Surgical History:  Procedure Laterality Date  . ABDOMINAL HYSTERECTOMY N/A 06/28/2015   Procedure: HYSTERECTOMY ABDOMINAL;  Surgeon: Osborne Oman, MD;  Location: [facility];  Service: Gynecology;  Laterality: N/A;  Requested 06/28/15 @ 7:30a  . BILATERAL SALPINGECTOMY Bilateral 06/28/2015   Procedure: BILATERAL SALPINGECTOMY;  Surgeon: Osborne Oman, MD;  Location: [facility];  Service: Gynecology;  Laterality: Bilateral;  . CESAREAN SECTION     twice;  vertical incision x 2  . COLONOSCOPY N/A 10/12/2013   Procedure: COLONOSCOPY;  Surgeon: Jerene Bears, MD;  [facility];  Service: Endoscopy;  Laterality: N/A;  . ESOPHAGEAL MANOMETRY N/A 06/29/2013   Procedure: ESOPHAGEAL MANOMETRY (EM);  Surgeon: Jerene Bears, MD;  [facility];  Service: Gastroenterology;  Laterality: N/A;  . HELLER MYOTOMY N/A 10/08/2013   Procedure: LAPAROSCOPIC HELLER MYOTOMY, DOR FUNDIPLICATION, UPPER ENDOSCOPY;  Surgeon: Odis Hollingshead, MD;   Location: [facility];  Service: General;  Laterality: N/A;  . HERNIA REPAIR    . HYSTEROSCOPY WITH RESECTOSCOPE N/A 05/03/2015   Procedure: Diagnostic Hysteroscopy;  Surgeon: Lavonia Drafts, MD;  Location: [facility];  Service: Gynecology;  Laterality: N/A;  wants Novasure and myosure.  Marland Kitchen LYSIS OF ADHESION  06/28/2015   Procedure: LYSIS OF ADHESION;  Surgeon: Osborne Oman, MD;  Location: [facility];  Service: Gynecology;;  . OTHER SURGICAL HISTORY     biopsy axillary ,right arm  . right arm biopsy    . UPPER GI ENDOSCOPY N/A 10/08/2013   Procedure: UPPER GI ENDOSCOPY;  Surgeon: Odis Hollingshead, MD;  Location: [facility];  Service: General;  Laterality: N/A;  . WISDOM TOOTH EXTRACTION     Family History  Problem Relation Age of Onset  . Hyperlipidemia Mother   . Diabetes Mother   . Stroke Mother   . Diabetes Brother   . Colon cancer Father   . Esophageal cancer Neg Hx   . Rectal cancer Neg Hx   . Stomach cancer Neg Hx    Social History  Substance Use Topics  . Smoking status: Never Smoker  . Smokeless tobacco: Never Used  . Alcohol use No   OB History    Gravida Para Term Preterm AB Living   5 3 3   2 3    SAB TAB Ectopic Multiple Live Births   2  Review of Systems  Constitutional: Positive for fatigue.  HENT: Positive for congestion and rhinorrhea.   Eyes: Negative.   Respiratory: Positive for cough.   Cardiovascular: Negative.   Gastrointestinal: Negative.   Endocrine: Negative.   Genitourinary: Negative.   Musculoskeletal: Negative.   Allergic/Immunologic: Negative.   Neurological: Negative.   Hematological: Negative.   Psychiatric/Behavioral: Negative.     Allergies  Latex  Home Medications   Prior to Admission medications   Medication Sig Start Date End Date Taking? Authorizing Provider  emtricitabine-tenofovir AF (DESCOVY) 200-25 MG tablet Take 1 tablet by mouth daily. 12/14/15  Yes Campbell Riches, MD  fesoterodine (TOVIAZ) 8 MG TB24 tablet  Take 8 mg by mouth daily.   Yes Historical Provider, MD  HYDROcodone-homatropine (HYCODAN) 5-1.5 MG/5ML syrup Take 5 mLs by mouth every 6 (six) hours as needed for cough. 12/21/16  Yes Patrecia Pour, MD  predniSONE (DELTASONE) 20 MG tablet Two daily with food 12/26/16  Yes Robyn Haber, MD  PROAIR HFA 108 [phone] BASE) MCG/ACT inhaler Inhale 2 puffs into the lungs every 4 (four) hours as needed for wheezing or shortness of breath. Shortness of breath/wheezing 12/31/14  Yes Historical Provider, MD  ranitidine (ZANTAC) 150 MG tablet TAKE 1 TABLET BY MOUTH EVERY NIGHT AT BEDTIME(12 HOURS FROM REYETAZ) 07/30/16  Yes Campbell Riches, MD  TIVICAY 50 MG tablet TAKE 1 TABLET BY MOUTH EVERY EVENING 07/30/16  Yes Campbell Riches, MD  valACYclovir (VALTREX) 500 MG tablet TAKE 1 TABLET(500 MG) BY MOUTH DAILY 12/26/16  Yes Truman Hayward, MD  benzonatate (TESSALON) 100 MG capsule Take 2 capsules (200 mg total) by mouth 3 (three) times daily as needed for cough. 01/02/17   Lysbeth Penner, FNP  predniSONE (DELTASONE) 5 MG tablet Take 6 po qd x 2days then 4 po qd x 2days then 3 po qd x2days then 2po qd x2days then 1 po qd x2days then stop 01/02/17   Lysbeth Penner, FNP  SUMAtriptan (IMITREX) 100 MG tablet Take 1 tablet (100 mg total) by mouth every 2 (two) hours as needed for migraine. May repeat in 2 hours if headache persists or recurs. 01/02/17   Lysbeth Penner, FNP   Meds Ordered and Administered this Visit  Medications - No data to display  BP 109/69 (BP Location: Left Arm)   Pulse 68   Temp 98.7 F (37.1 C) (Oral)   LMP  (LMP Unknown)   SpO2 100%  No data found.   Physical Exam  Constitutional: She is oriented to person, place, and time. She appears well-developed and well-nourished.  HENT:  Head: Normocephalic and atraumatic.  Right Ear: External ear normal.  Left Ear: External ear normal.  Mouth/Throat: Oropharynx is clear and moist.  Eyes: Conjunctivae and EOM are normal. Pupils are  equal, round, and reactive to light.  Neck: Normal range of motion. Neck supple.  Cardiovascular: Normal rate, regular rhythm and normal heart sounds.   Pulmonary/Chest: Effort normal and breath sounds normal.  Abdominal: Soft. Bowel sounds are normal.  Musculoskeletal: Normal range of motion.  Neurological: She is alert and oriented to person, place, and time.  Nursing note and vitals reviewed.   Urgent Care Course     Procedures (including critical care time)  Labs Review Labs Reviewed - No data to display  Imaging Review No results found.   Visual Acuity Review  Right Eye Distance:   Left Eye Distance:   Bilateral Distance:    Right Eye Near:  Left Eye Near:    Bilateral Near:         MDM   1. Bronchitis   2. Cough   3. Migraine with aura and without status migrainosus, not intractable   4. Chest wall pain    zpak Tessalon perles 200mg  one po tid prn  Medrol dose pack Albuterol MDI Spacer  Push po fluids, rest, tylenol and motrin otc prn as directed for fever, arthralgias, and myalgias.  Follow up prn if sx's continue or persist.    Lysbeth Penner, FNP 01/02/17 1151

## 2017-01-03 ENCOUNTER — Other Ambulatory Visit: Payer: Self-pay | Admitting: Infectious Diseases

## 2017-01-03 DIAGNOSIS — B20 Human immunodeficiency virus [HIV] disease: Secondary | ICD-10-CM

## 2017-01-03 MED ORDER — DOLUTEGRAVIR SODIUM 50 MG PO TABS: 50.0000 mg | ORAL_TABLET | Freq: Every evening | ORAL | 0 refills | Status: DC

## 2017-01-11 DIAGNOSIS — B20 Human immunodeficiency virus [HIV] disease: Secondary | ICD-10-CM | POA: Diagnosis not present

## 2017-01-11 DIAGNOSIS — F319 Bipolar disorder, unspecified: Secondary | ICD-10-CM | POA: Diagnosis not present

## 2017-01-11 DIAGNOSIS — J45909 Unspecified asthma, uncomplicated: Secondary | ICD-10-CM | POA: Diagnosis not present

## 2017-01-11 DIAGNOSIS — Z833 Family history of diabetes mellitus: Secondary | ICD-10-CM | POA: Diagnosis not present

## 2017-01-17 ENCOUNTER — Other Ambulatory Visit: Payer: Medicare Other

## 2017-01-17 DIAGNOSIS — Z833 Family history of diabetes mellitus: Secondary | ICD-10-CM

## 2017-01-17 DIAGNOSIS — R7309 Other abnormal glucose: Secondary | ICD-10-CM

## 2017-01-17 DIAGNOSIS — F411 Generalized anxiety disorder: Secondary | ICD-10-CM | POA: Diagnosis not present

## 2017-01-17 DIAGNOSIS — B20 Human immunodeficiency virus [HIV] disease: Secondary | ICD-10-CM | POA: Diagnosis not present

## 2017-01-17 DIAGNOSIS — F310 Bipolar disorder, current episode hypomanic: Secondary | ICD-10-CM | POA: Diagnosis not present

## 2017-01-17 LAB — COMPLETE METABOLIC PANEL WITH GFR
ALT: 15 U/L (ref 6–29)
AST: 14 U/L (ref 10–30)
Albumin: 3.9 g/dL (ref 3.6–5.1)
Alkaline Phosphatase: 98 U/L (ref 33–115)
BUN: 11 mg/dL (ref 7–25)
CHLORIDE: 108 mmol/L (ref 98–110)
CO2: 25 mmol/L (ref 20–31)
Calcium: 9 mg/dL (ref 8.6–10.2)
Creat: 1.16 mg/dL — ABNORMAL HIGH (ref 0.50–1.10)
GFR, EST AFRICAN AMERICAN: 67 mL/min (ref >=60)
GFR, EST NON AFRICAN AMERICAN: 58 mL/min — AB (ref >=60)
Glucose, Bld: 83 mg/dL (ref 65–99)
Potassium: 4.1 mmol/L (ref 3.5–5.3)
Sodium: 139 mmol/L (ref 135–146)
Total Bilirubin: 0.3 mg/dL (ref 0.2–1.2)
Total Protein: 6.6 g/dL (ref 6.1–8.1)

## 2017-01-17 LAB — CBC WITH DIFFERENTIAL/PLATELET
BASOS PCT: 1 %
Basophils Absolute: 38 cells/uL (ref 0–200)
EOS ABS: 76 {cells}/uL (ref 15–500)
Eosinophils Relative: 2 %
HEMATOCRIT: 37.7 % (ref 35.0–45.0)
Hemoglobin: 12.1 g/dL (ref 11.7–15.5)
Lymphocytes Relative: 52 %
Lymphs Abs: 1976 cells/uL (ref 850–3900)
MCH: 28.5 pg (ref 27.0–33.0)
MCHC: 32.1 g/dL (ref 32.0–36.0)
MCV: 88.9 fL (ref 80.0–100.0)
MONO ABS: 228 {cells}/uL (ref 200–950)
MPV: 10.1 fL (ref 7.5–12.5)
Monocytes Relative: 6 %
NEUTROS ABS: 1482 {cells}/uL — AB (ref 1500–7800)
Neutrophils Relative %: 39 %
Platelets: 232 10*3/uL (ref 140–400)
RBC: 4.24 MIL/uL (ref 3.80–5.10)
RDW: 17.2 % — ABNORMAL HIGH (ref 11.0–15.0)
WBC: 3.8 10*3/uL (ref 3.8–10.8)

## 2017-01-18 LAB — HEMOGLOBIN A1C
HEMOGLOBIN A1C: 5.7 % — AB (ref <5.7)
MEAN PLASMA GLUCOSE: 117 mg/dL

## 2017-01-18 LAB — T-HELPER CELL (CD4) - (RCID CLINIC ONLY)
CD4 % Helper T Cell: 34 % (ref 33–55)
CD4 T CELL ABS: 710 /uL (ref 400–2700)

## 2017-01-19 LAB — HIV-1 RNA QUANT-NO REFLEX-BLD
HIV 1 RNA Quant: <20 copies/mL
HIV-1 RNA Quant, Log: <1.3 Log copies/mL

## 2017-01-25 DIAGNOSIS — E669 Obesity, unspecified: Secondary | ICD-10-CM | POA: Diagnosis not present

## 2017-01-25 DIAGNOSIS — L819 Disorder of pigmentation, unspecified: Secondary | ICD-10-CM | POA: Diagnosis not present

## 2017-01-25 DIAGNOSIS — Z09 Encounter for follow-up examination after completed treatment for conditions other than malignant neoplasm: Secondary | ICD-10-CM | POA: Diagnosis not present

## 2017-01-25 DIAGNOSIS — J45909 Unspecified asthma, uncomplicated: Secondary | ICD-10-CM | POA: Diagnosis not present

## 2017-01-29 ENCOUNTER — Other Ambulatory Visit: Payer: Self-pay | Admitting: Infectious Diseases

## 2017-01-29 DIAGNOSIS — K219 Gastro-esophageal reflux disease without esophagitis: Secondary | ICD-10-CM

## 2017-01-30 ENCOUNTER — Other Ambulatory Visit: Payer: Self-pay | Admitting: *Deleted

## 2017-01-30 ENCOUNTER — Other Ambulatory Visit: Payer: Self-pay | Admitting: Infectious Diseases

## 2017-01-30 DIAGNOSIS — B20 Human immunodeficiency virus [HIV] disease: Secondary | ICD-10-CM

## 2017-01-30 MED ORDER — EMTRICITABINE-TENOFOVIR AF 200-25 MG PO TABS: 1.0000 | ORAL_TABLET | Freq: Every day | ORAL | 0 refills | Status: DC

## 2017-01-31 ENCOUNTER — Ambulatory Visit: Payer: Medicare Other | Admitting: Infectious Diseases

## 2017-02-05 ENCOUNTER — Telehealth: Payer: Self-pay | Admitting: *Deleted

## 2017-02-05 NOTE — Telephone Encounter (Signed)
Patient called stating she saw her PCP at [facility] and was told she was pre-diabetic. She said that she was told "anything white is not nice". And this was not helpful to her. Advised her I would call Butch Penny Plyler at [facility] to see if she could offer diabetic teaching. Had to leave a voice mail and advised patient I will call her back when I hear back from [facility]. Patient informed me she has several cases of soda at home and was currently eating [employer] candy and was that ok. Advised her to close the box of candy and that it would be better for her to drink water instead of soda. She said she is going to try and feels she would definitely benefit from diabetic teaching.

## 2017-02-11 ENCOUNTER — Ambulatory Visit: Payer: Medicare Other | Admitting: Infectious Diseases

## 2017-02-11 DIAGNOSIS — F411 Generalized anxiety disorder: Secondary | ICD-10-CM | POA: Diagnosis not present

## 2017-02-11 DIAGNOSIS — F319 Bipolar disorder, unspecified: Secondary | ICD-10-CM | POA: Diagnosis not present

## 2017-02-14 DIAGNOSIS — F3132 Bipolar disorder, current episode depressed, moderate: Secondary | ICD-10-CM | POA: Diagnosis not present

## 2017-02-14 DIAGNOSIS — F411 Generalized anxiety disorder: Secondary | ICD-10-CM | POA: Diagnosis not present

## 2017-02-18 ENCOUNTER — Ambulatory Visit (INDEPENDENT_AMBULATORY_CARE_PROVIDER_SITE_OTHER): Payer: Medicare Other | Admitting: Infectious Diseases

## 2017-02-18 ENCOUNTER — Encounter: Payer: Self-pay | Admitting: Infectious Diseases

## 2017-02-18 VITALS — BP 110/70 | HR 78 | Temp 98.9°F | Wt 166.0 lb

## 2017-02-18 DIAGNOSIS — Z1231 Encounter for screening mammogram for malignant neoplasm of breast: Secondary | ICD-10-CM | POA: Diagnosis not present

## 2017-02-18 DIAGNOSIS — B20 Human immunodeficiency virus [HIV] disease: Secondary | ICD-10-CM | POA: Diagnosis not present

## 2017-02-18 DIAGNOSIS — Z23 Encounter for immunization: Secondary | ICD-10-CM

## 2017-02-18 DIAGNOSIS — Z789 Other specified health status: Secondary | ICD-10-CM | POA: Diagnosis not present

## 2017-02-18 DIAGNOSIS — M67441 Ganglion, right hand: Secondary | ICD-10-CM | POA: Diagnosis not present

## 2017-02-18 DIAGNOSIS — F319 Bipolar disorder, unspecified: Secondary | ICD-10-CM | POA: Diagnosis not present

## 2017-02-18 DIAGNOSIS — D249 Benign neoplasm of unspecified breast: Secondary | ICD-10-CM | POA: Diagnosis not present

## 2017-02-18 DIAGNOSIS — D229 Melanocytic nevi, unspecified: Secondary | ICD-10-CM

## 2017-02-18 DIAGNOSIS — Z0000 Encounter for general adult medical examination without abnormal findings: Secondary | ICD-10-CM

## 2017-02-18 DIAGNOSIS — Z113 Encounter for screening for infections with a predominantly sexual mode of transmission: Secondary | ICD-10-CM | POA: Diagnosis not present

## 2017-02-18 DIAGNOSIS — Z79899 Other long term (current) drug therapy: Secondary | ICD-10-CM | POA: Diagnosis not present

## 2017-02-18 DIAGNOSIS — F411 Generalized anxiety disorder: Secondary | ICD-10-CM

## 2017-02-18 DIAGNOSIS — Z9189 Other specified personal risk factors, not elsewhere classified: Secondary | ICD-10-CM

## 2017-02-18 DIAGNOSIS — R739 Hyperglycemia, unspecified: Secondary | ICD-10-CM | POA: Diagnosis not present

## 2017-02-18 DIAGNOSIS — R928 Other abnormal and inconclusive findings on diagnostic imaging of breast: Secondary | ICD-10-CM | POA: Diagnosis not present

## 2017-02-18 NOTE — Progress Notes (Signed)
   Subjective:    Patient ID: Danielle [name], female    DOB: May 03, 1971, 46 y.o.   MRN: [id number]  HPI 46 yo F HIV+.  Additionally, she has a history of GERD, bipolar, R hand ganglion cyst removed 08-2016, and abnormal uterine bleed and hysterectomy.  Had prev mammogram 07-2015 that showed fibro-adenomas. Patient was previously on ATVr/TRV then swtiched to tivicay-descovy.  "I'm mad because Kenny's gone" Got the flu (after not getting vax) and then pneumonia.  Also frustrated with being dx as "pre-diabetic". She would like more information about this.   HIV 1 RNA Quant (copies/mL)  Date Value  01/17/2017 <20 NOT DETECTED  06/13/2016 <20  11/30/2015 <20   CD4 T Cell Abs (/uL)  Date Value  01/17/2017 710  06/13/2016 590  11/30/2015 630    Review of Systems  Constitutional: Negative for appetite change and unexpected weight change.  HENT: Negative for nosebleeds.   Gastrointestinal: Negative for diarrhea and nausea.  Genitourinary: Negative for difficulty urinating and vaginal bleeding.  Neurological: Negative for headaches.  Psychiatric/Behavioral: Negative for dysphoric mood.       Objective:   Physical Exam  Constitutional: She appears well-developed and well-nourished.  HENT:  Mouth/Throat: No oropharyngeal exudate.  Eyes: EOM are normal. Pupils are equal, round, and reactive to light.  Neck: Neck supple.  Cardiovascular: Normal rate, regular rhythm and normal heart sounds.   Pulmonary/Chest: Effort normal and breath sounds normal.  Abdominal: Soft. Bowel sounds are normal. There is no tenderness. There is no rebound.  Lymphadenopathy:    She has no cervical adenopathy.  Skin:  Multiple moles.   Psychiatric: Her mood appears anxious. Her affect is blunt. Her affect is not angry, not labile and not inappropriate. She does not exhibit a depressed mood.      Assessment & Plan:

## 2017-02-18 NOTE — Assessment & Plan Note (Signed)
Will send her for skin exam

## 2017-02-18 NOTE — Assessment & Plan Note (Signed)
Will get her repeat mammo.  

## 2017-02-18 NOTE — Assessment & Plan Note (Addendum)
She appears to be doing well Given condoms Given mening vax Will see her back in 9 months.

## 2017-02-18 NOTE — Assessment & Plan Note (Signed)
Has been resected.   Will mark as resolved.

## 2017-02-18 NOTE — Assessment & Plan Note (Signed)
Will have her seen in IM clinic, nutrition.  Encouraged her to exercise.  Watch her diet.

## 2017-02-18 NOTE — Assessment & Plan Note (Signed)
She will f/u with her counselor.

## 2017-02-18 NOTE — Addendum Note (Signed)
Addended by: Landis Gandy on: 02/18/2017 04:27 PM   Modules accepted: Orders

## 2017-02-23 ENCOUNTER — Emergency Department (HOSPITAL_COMMUNITY)
Admission: EM | Admit: 2017-02-23 | Discharge: 2017-02-23 | Disposition: A | Discharge status: Home / Self Care | Payer: Medicare Other | Attending: Emergency Medicine | Admitting: Emergency Medicine

## 2017-02-23 ENCOUNTER — Encounter (HOSPITAL_COMMUNITY): Payer: Self-pay | Admitting: Emergency Medicine

## 2017-02-23 ENCOUNTER — Emergency Department (HOSPITAL_COMMUNITY): Payer: Medicare Other

## 2017-02-23 DIAGNOSIS — J45909 Unspecified asthma, uncomplicated: Secondary | ICD-10-CM | POA: Insufficient documentation

## 2017-02-23 DIAGNOSIS — W458XXA Other foreign body or object entering through skin, initial encounter: Secondary | ICD-10-CM | POA: Diagnosis not present

## 2017-02-23 DIAGNOSIS — B20 Human immunodeficiency virus [HIV] disease: Secondary | ICD-10-CM | POA: Diagnosis not present

## 2017-02-23 DIAGNOSIS — Z79899 Other long term (current) drug therapy: Secondary | ICD-10-CM | POA: Insufficient documentation

## 2017-02-23 DIAGNOSIS — S61422A Laceration with foreign body of left hand, initial encounter: Secondary | ICD-10-CM | POA: Insufficient documentation

## 2017-02-23 DIAGNOSIS — M795 Residual foreign body in soft tissue: Secondary | ICD-10-CM

## 2017-02-23 DIAGNOSIS — Z91040 Latex allergy status: Secondary | ICD-10-CM | POA: Diagnosis not present

## 2017-02-23 DIAGNOSIS — Y93E2 Activity, laundry: Secondary | ICD-10-CM | POA: Diagnosis not present

## 2017-02-23 DIAGNOSIS — Y998 Other external cause status: Secondary | ICD-10-CM | POA: Insufficient documentation

## 2017-02-23 DIAGNOSIS — M79641 Pain in right hand: Secondary | ICD-10-CM | POA: Diagnosis not present

## 2017-02-23 DIAGNOSIS — Y92008 Other place in unspecified non-institutional (private) residence as the place of occurrence of the external cause: Secondary | ICD-10-CM | POA: Diagnosis not present

## 2017-02-23 NOTE — Discharge Instructions (Signed)
As discussed, use caution while removing dryer lint and maybe wear gloves as needed. If anyone is exposed to fiberglass, it may be found in the laundry and easily penetrates skin.  Follow up with you PCP as needed. Wash hands in cold water to keep pores closed.

## 2017-02-23 NOTE — ED Provider Notes (Signed)
[facility] DEPT Provider Note   CSN: [id number] Arrival date & time: 02/23/17  1616     History   Chief Complaint Chief Complaint  Patient presents with  . Hand Pain    HPI Danielle [name] is a 46 y.o. female presenting with sudden sharp pain in the finger pad of her right thumb after manipulating lint from her dryer. She experiences pain to light touch of her finger since. It feels as though she has a splinter but cant see it. She denies any other symptoms.  HPI  Past Medical History:  Diagnosis Date  . Anemia   . Anxiety   . Asthma   . Bulging lumbar disc   . Chronic back pain   . Depression   . Genital HSV   . GERD (gastroesophageal reflux disease)   . Hepatitis   . HIV infection ([place])   . Migraines   . Pneumonia     Patient Active Problem List   Diagnosis Date Noted  . Numerous moles 02/18/2017  . Hyperglycemia 02/18/2017  . Hepatitis B immune 02/18/2017  . 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 S/P total abdominal hysterectomy 06/28/2015  . Abnormal uterine bleeding (AUB) 05/03/2015  . Condyloma acuminatum 02/23/2015  . Fibroids 01/05/2015  . Urinary incontinence 01/05/2015  . Abnormal finding on GI tract imaging 10/12/2013  . Anemia-chronic 10/09/2013  . Achalasia s/p laparoscopic Heller myotomy and Dor fundoplication 38/75/6433  . Ganglion cyst of finger of right hand 07/06/2013  . Trichomoniasis of vagina 06/03/2013  . GERD (gastroesophageal reflux disease) 03/16/2013  . Depression 02/10/2009  . Human immunodeficiency virus (HIV) disease ([place]) 03/24/2007  . Anxiety state 03/24/2007  . ASTHMA 03/24/2007    Past Surgical History:  Procedure Laterality Date  . ABDOMINAL HYSTERECTOMY N/A 06/28/2015   Procedure: HYSTERECTOMY ABDOMINAL;  Surgeon: Osborne Oman, MD;  Location: [facility];  Service: Gynecology;  Laterality: N/A;  Requested 06/28/15 @ 7:30a  . BILATERAL SALPINGECTOMY Bilateral 06/28/2015   Procedure: BILATERAL SALPINGECTOMY;  Surgeon:  Osborne Oman, MD;  Location: [facility];  Service: Gynecology;  Laterality: Bilateral;  . CESAREAN SECTION     twice;  vertical incision x 2  . COLONOSCOPY N/A 10/12/2013   Procedure: COLONOSCOPY;  Surgeon: Jerene Bears, MD;  Location: [facility];  Service: Endoscopy;  Laterality: N/A;  . ESOPHAGEAL MANOMETRY N/A 06/29/2013   Procedure: ESOPHAGEAL MANOMETRY (EM);  Surgeon: Jerene Bears, MD;  Location: [facility];  Service: Gastroenterology;  Laterality: N/A;  . HELLER MYOTOMY N/A 10/08/2013   Procedure: LAPAROSCOPIC HELLER MYOTOMY, DOR FUNDIPLICATION, UPPER ENDOSCOPY;  Surgeon: Odis Hollingshead, MD;  Location: [facility];  Service: General;  Laterality: N/A;  . HERNIA REPAIR    . HYSTEROSCOPY WITH RESECTOSCOPE N/A 05/03/2015   Procedure: Diagnostic Hysteroscopy;  Surgeon: Lavonia Drafts, MD;  Location: [facility];  Service: Gynecology;  Laterality: N/A;  wants Novasure and myosure.  Marland Kitchen LYSIS OF ADHESION  06/28/2015   Procedure: LYSIS OF ADHESION;  Surgeon: Osborne Oman, MD;  Location: [facility];  Service: Gynecology;;  . OTHER SURGICAL HISTORY     biopsy axillary ,right arm  . right arm biopsy    . UPPER GI ENDOSCOPY N/A 10/08/2013   Procedure: UPPER GI ENDOSCOPY;  Surgeon: Odis Hollingshead, MD;  Location: [facility];  Service: General;  Laterality: N/A;  . WISDOM TOOTH EXTRACTION      OB History    Gravida Para Term Preterm AB Living   5 3 3    2  3   SAB TAB Ectopic Multiple Live Births   2               Home Medications    Prior to Admission medications   Medication Sig Start Date End Date Taking? Authorizing Provider  benzonatate (TESSALON) 100 MG capsule Take 2 capsules (200 mg total) by mouth 3 (three) times daily as needed for cough. Patient not taking: Reported on 02/18/2017 01/02/17   Lysbeth Penner, FNP  cetirizine (ZYRTEC) 10 MG tablet TK 1 T PO  D 01/25/17   [provider]  emtricitabine-tenofovir AF (DESCOVY) 200-25 MG tablet Take 1 tablet by mouth daily.  01/30/17   Campbell Riches, MD  mirabegron ER (MYRBETRIQ) 50 MG TB24 tablet Take 50 mg by mouth daily.    [provider]  PROAIR HFA 108 (90 BASE) MCG/ACT inhaler Inhale 2 puffs into the lungs every 4 (four) hours as needed for wheezing or shortness of breath. Shortness of breath/wheezing 12/31/14   [provider]  Doug Sou 90 MCG/ACT inhaler INHALE 2 PUFFS BY INHALATION ROUTE BID 02/08/17   [provider]  ranitidine (ZANTAC) 150 MG tablet TAKE 1 TABLET BY MOUTH EVERY NIGHT AT MPNTIRW(43 HOURS FROM REYETAZ) 01/29/17   Campbell Riches, MD  sertraline (ZOLOFT) 25 MG tablet TK 1 T PO HS FOR ANXIETY 02/14/17   [provider]  SUMAtriptan (IMITREX) 100 MG tablet Take 1 tablet (100 mg total) by mouth every 2 (two) hours as needed for migraine. May repeat in 2 hours if headache persists or recurs. 01/02/17   Lysbeth Penner, FNP  TIVICAY 50 MG tablet TAKE 1 TABLET BY MOUTH EVERY EVENING 01/30/17   Campbell Riches, MD  valACYclovir (VALTREX) 500 MG tablet TAKE 1 TABLET(500 MG) BY MOUTH DAILY 12/26/16   Tommy Medal, Lavell Islam, MD    Family History Family History  Problem Relation Age of Onset  . Hyperlipidemia Mother   . Diabetes Mother   . Stroke Mother   . Diabetes Brother   . Colon cancer Father   . Esophageal cancer Neg Hx   . Rectal cancer Neg Hx   . Stomach cancer Neg Hx     Social History Social History  Substance Use Topics  . Smoking status: Never Smoker  . Smokeless tobacco: Never Used  . Alcohol use No     Allergies   Latex   Review of Systems Review of Systems  Constitutional: Negative for chills and fever.  Respiratory: Negative for shortness of breath.   Cardiovascular: Negative for chest pain.  Gastrointestinal: Negative for nausea and vomiting.  Musculoskeletal: Positive for myalgias. Negative for joint swelling, neck pain and neck stiffness.  Skin: Negative for color change, pallor, rash and wound.  Neurological:  Negative for weakness and numbness.     Physical Exam Updated Vital Signs BP 132/64 (BP Location: Right Arm)   Pulse 83   Temp 98.7 F (37.1 C) (Oral)   Resp 16   Ht 5\' 1"  (1.549 m)   Wt 75.3 kg (166 lb)   LMP  (LMP Unknown)   SpO2 100%   BMI 31.37 kg/m   Physical Exam  Constitutional: She appears well-developed and well-nourished. No distress.  Afebrile, nontoxic-appearing, sitting comfortably in chair in no acute distress.  HENT:  Head: Normocephalic and atraumatic.  Eyes: EOM are normal.  Neck: Normal range of motion.  Cardiovascular: Normal rate, regular rhythm, normal heart sounds and intact distal pulses.   No murmur  heard. Pulmonary/Chest: Effort normal and breath sounds normal. No respiratory distress. She has no wheezes. She has no rales.  Musculoskeletal: Normal range of motion. She exhibits tenderness. She exhibits no edema or deformity.  Patient has full range of motion of the thumb no visualized entry wound, no splinter visualized. Upon close visualization noticed a translucent small fiber protruding from her finger pad resembling thin fiberglass fiber.  Neurological: She is alert. No sensory deficit.  Skin: Skin is warm and dry. Capillary refill takes less than 2 seconds. No rash noted. She is not diaphoretic. No erythema. No pallor.  Psychiatric: She has a normal mood and affect.  Nursing note and vitals reviewed.    ED Treatments / Results  Labs (all labs ordered are listed, but only abnormal results are displayed) Labs Reviewed - No data to display  EKG  EKG Interpretation None       Radiology Dg Finger Thumb Right  Result Date: 02/23/2017 CLINICAL DATA:  Sharp pain in the right thumb. EXAM: RIGHT THUMB 2+V COMPARISON:  None. FINDINGS: There is no evidence of fracture or dislocation. There is no evidence of arthropathy or radiopaque foreign bodies. Soft tissues are unremarkable IMPRESSION: Negative. Electronically Signed   By: Fidela Salisbury  M.D.   On: 02/23/2017 17:36    Procedures .Foreign Body Removal Date/Time: 02/23/2017 8:07 PM Performed by: Avie Echevaria B Authorized by: Avie Echevaria B  Consent: Verbal consent obtained. Written consent obtained. Consent given by: patient Patient understanding: patient states understanding of the procedure being performed Patient identity confirmed: verbally with patient Body area: skin General location: upper extremity Location details: left thumb  Sedation: Patient sedated: no Patient restrained: no Patient cooperative: yes Localization method: visualized Complexity: simple 2 objects recovered. Objects recovered: small fiberglass  Post-procedure assessment: foreign body removed Patient tolerance: Patient tolerated the procedure well with no immediate complications   (including critical care time)  Medications Ordered in ED Medications - No data to display   Initial Impression / Assessment and Plan / ED Course  I have reviewed the triage vital signs and the nursing notes.  Pertinent labs & imaging results that were available during my care of the patient were reviewed by me and considered in my medical decision making (see chart for details).    Patient presented with sharp pain to light touch of left thumb finger pad after manipulating lint from her dryer.  Xray were obtained from triage which were negative for foreign body.  Upon close examination noticed a small fiberglass looking fibers protruding from her finger. Both were removed using forceps and patient reported significant improvement after removal. Encouraged patient to wash her hands in cold water her symptoms subsided.  Discharge home with follow-up with PCP as needed.  Final Clinical Impressions(s) / ED Diagnoses   Final diagnoses:  Foreign body (FB) in soft tissue    New Prescriptions Discharge Medication List as of 02/23/2017  8:13 PM       Emeline General, PA-C 02/24/17 8882      Milton Ferguson, MD 02/24/17 1520

## 2017-02-23 NOTE — ED Triage Notes (Signed)
Pt states "i was washing my hair and all of a sudden I felt a pinching like a splinter in my right thumb, when I rub it I can feel it, it stings." No visible foreign body identified. Pt thumb has no obvious injury.

## 2017-02-28 DIAGNOSIS — F319 Bipolar disorder, unspecified: Secondary | ICD-10-CM | POA: Diagnosis not present

## 2017-02-28 DIAGNOSIS — F411 Generalized anxiety disorder: Secondary | ICD-10-CM | POA: Diagnosis not present

## 2017-03-01 ENCOUNTER — Encounter: Payer: Self-pay | Admitting: *Deleted

## 2017-03-01 ENCOUNTER — Ambulatory Visit (INDEPENDENT_AMBULATORY_CARE_PROVIDER_SITE_OTHER): Payer: Medicare Other | Admitting: *Deleted

## 2017-03-01 ENCOUNTER — Other Ambulatory Visit (HOSPITAL_COMMUNITY)
Admission: RE | Admit: 2017-03-01 | Discharge: 2017-03-01 | Disposition: A | Discharge status: Home / Self Care | Payer: Medicare Other | Source: Ambulatory Visit | Attending: Infectious Diseases | Admitting: Infectious Diseases

## 2017-03-01 DIAGNOSIS — Z124 Encounter for screening for malignant neoplasm of cervix: Secondary | ICD-10-CM | POA: Insufficient documentation

## 2017-03-01 DIAGNOSIS — Z113 Encounter for screening for infections with a predominantly sexual mode of transmission: Secondary | ICD-10-CM | POA: Diagnosis not present

## 2017-03-01 NOTE — Patient Instructions (Addendum)
Advised patient that her PAP smear results will be ready by the end of next week and she can view them through [facility].  Patient given telephone number to call to schedule mammogram ordered by Dr. Johnnye Sima.  Advised patient to call PCP regarding lower extremity edema, and strong family history for colon CA.

## 2017-03-01 NOTE — Progress Notes (Signed)
Subjective:     Danielle [name] is a 46 y.o. woman who comes in today for a  pap smear only. Hysterectomy 2017 for fibroid tumors. Previous abnormal Pap smears: no:17258. Contraception: condoms.   Objective:    LMP  (LMP Unknown)   RN observed 1+ lower extremity edema. Pelvic Exam: . Pap smear obtained.   Assessment:    Screening pap smear.   Plan:    Follow up in one year, or as indicated by Pap results.   Advised patient to call PCP regarding lower extremity edema, and strong family history for colon CA.  Patient verbalized that she would call PCP for appointment.

## 2017-03-04 ENCOUNTER — Other Ambulatory Visit: Payer: Self-pay | Admitting: Infectious Diseases

## 2017-03-04 DIAGNOSIS — F411 Generalized anxiety disorder: Secondary | ICD-10-CM | POA: Diagnosis not present

## 2017-03-04 DIAGNOSIS — F410 Panic disorder [episodic paroxysmal anxiety] without agoraphobia: Secondary | ICD-10-CM | POA: Diagnosis not present

## 2017-03-04 DIAGNOSIS — B20 Human immunodeficiency virus [HIV] disease: Secondary | ICD-10-CM

## 2017-03-04 DIAGNOSIS — N63 Unspecified lump in unspecified breast: Secondary | ICD-10-CM

## 2017-03-04 LAB — CYTOLOGY - PAP: DIAGNOSIS: NEGATIVE

## 2017-03-04 LAB — CERVICOVAGINAL ANCILLARY ONLY
Chlamydia: NEGATIVE
Neisseria Gonorrhea: NEGATIVE

## 2017-03-05 DIAGNOSIS — F3132 Bipolar disorder, current episode depressed, moderate: Secondary | ICD-10-CM | POA: Diagnosis not present

## 2017-03-05 DIAGNOSIS — F411 Generalized anxiety disorder: Secondary | ICD-10-CM | POA: Diagnosis not present

## 2017-03-08 DIAGNOSIS — E669 Obesity, unspecified: Secondary | ICD-10-CM | POA: Diagnosis not present

## 2017-03-08 DIAGNOSIS — Z713 Dietary counseling and surveillance: Secondary | ICD-10-CM | POA: Diagnosis not present

## 2017-03-11 ENCOUNTER — Ambulatory Visit
Admission: RE | Admit: 2017-03-11 | Discharge: 2017-03-11 | Disposition: A | Discharge status: Home / Self Care | Payer: Medicare Other | Source: Ambulatory Visit | Attending: Infectious Diseases | Admitting: Infectious Diseases

## 2017-03-11 DIAGNOSIS — B20 Human immunodeficiency virus [HIV] disease: Secondary | ICD-10-CM

## 2017-03-11 DIAGNOSIS — N6311 Unspecified lump in the right breast, upper outer quadrant: Secondary | ICD-10-CM | POA: Diagnosis not present

## 2017-03-11 DIAGNOSIS — R928 Other abnormal and inconclusive findings on diagnostic imaging of breast: Secondary | ICD-10-CM | POA: Diagnosis not present

## 2017-03-11 DIAGNOSIS — N63 Unspecified lump in unspecified breast: Secondary | ICD-10-CM

## 2017-03-12 DIAGNOSIS — F319 Bipolar disorder, unspecified: Secondary | ICD-10-CM | POA: Diagnosis not present

## 2017-03-12 DIAGNOSIS — F411 Generalized anxiety disorder: Secondary | ICD-10-CM | POA: Diagnosis not present

## 2017-03-20 ENCOUNTER — Telehealth: Payer: Self-pay | Admitting: *Deleted

## 2017-03-20 NOTE — Telephone Encounter (Signed)
Patient called for the results of her mammogram. I called her back and advised her that the radiologist spoke to her about the findings and she said, "I know, but it said that I am at a high risk". This concerned her and she wanted Dr. Algis Downs advise on what exactly these findings mean. Advised her that MD would return on 03/25/17 and I would route a message to him. Danielle [name]

## 2017-03-21 DIAGNOSIS — F410 Panic disorder [episodic paroxysmal anxiety] without agoraphobia: Secondary | ICD-10-CM | POA: Diagnosis not present

## 2017-03-21 DIAGNOSIS — F411 Generalized anxiety disorder: Secondary | ICD-10-CM | POA: Diagnosis not present

## 2017-03-26 ENCOUNTER — Encounter: Payer: Medicare Other | Attending: Infectious Diseases | Admitting: Registered"

## 2017-03-26 ENCOUNTER — Encounter: Payer: Self-pay | Admitting: Registered"

## 2017-03-26 DIAGNOSIS — Z713 Dietary counseling and surveillance: Secondary | ICD-10-CM | POA: Diagnosis not present

## 2017-03-26 DIAGNOSIS — F411 Generalized anxiety disorder: Secondary | ICD-10-CM | POA: Diagnosis not present

## 2017-03-26 DIAGNOSIS — F319 Bipolar disorder, unspecified: Secondary | ICD-10-CM | POA: Diagnosis not present

## 2017-03-26 DIAGNOSIS — R739 Hyperglycemia, unspecified: Secondary | ICD-10-CM

## 2017-03-26 DIAGNOSIS — Z9189 Other specified personal risk factors, not elsewhere classified: Secondary | ICD-10-CM | POA: Insufficient documentation

## 2017-03-26 NOTE — Telephone Encounter (Addendum)
Patient stopped by clinic today with concerns about her mammogram. She was concerned because she was told she was at "high risk" for cancer. Advised her that per the report she has dense breast and will need to have a follow up mammogram in 1 year. Because her breast are dense they are harder to read but no malignancies were noted. Also advised her that when she goes for a scan to limit her caffeine and try to not be on her cycle prior to her appointment. Advised her that unless she is having pain, leakage (from nipple) or dimpling to name a few she should be fine to wait her year. Also advised her she knows her body and if anything feels off to call the office. Also advised her to talk to Dr. Johnnye Sima at her next visit if she still has questions and I would let him know she has concerns.

## 2017-03-26 NOTE — Progress Notes (Signed)
Medical Nutrition Therapy:  Appt start time: 1400 end time:  1500.   Assessment:  Primary concerns today: Pt state the news about her pre-diabetes has made her nervous. (A1c 5.7 per chart) Pt states her doctor told her any white is not right and no sugar. Pt states she would normally puts 2 spoonfuls in coffee, but stopped putting in sugar and doesn't drink soda. Pt states she used to buy in bulk on sale drink and drink with dinner. Pt states RD told her that if she drinks soda to wait 1 hr and then drink water.  Preferred Learning Style:   No preference indicated   Learning Readiness:   Change in progress  MEDICATIONS: reviewed   DIETARY INTAKE:  Pt stated several foods that she enjoys: Raw carrots, corn, green beans with potatoes (mac&cheese, chicken, rice, rice, corn bread) lasagna, spaghetti, mac & cheese  24-hr recall:  B ( AM): none Snk ( AM): almonds  L ( PM): none Snk ( PM): cherries D ( PM): (child portion sizes) salad OR fruit Snk ( PM): almonds Beverages: sweet tea, hot tea  Usual physical activity: (ADLs) walk up stairs  Estimated energy needs: 1800 calories 200 g carbohydrates 135 g protein 50 g fat  Progress Towards Goal(s):  In progress.   Nutritional Diagnosis:  NI-1.4 Inadequate energy intake As related to skipping meals.  As evidenced by dietary recall, pt reported small portion sizes.    Intervention:  Nutrition Education. Nutrition education for managing blood glucose with diet and lifestyle changes. Described diabetes. Described the role of different macronutrients on glucose.  Explained how carbohydrates affect blood glucose.  Stated what foods contain the most carbohydrates.    Plan: (AVS) Talk to your doctor about urgent need to urinate Consider adding more beans (dry) into your diet Consider having almonds for breakfast with your tea (try 1/2 the sweetener) Aim to have balanced meals and snacks  (I believe you had Nissen fundoplication for  acid reflux)  Teaching Method Utilized:  Visual Auditory  Handouts given during visit include:  My Plate  Snack Sheet  Barriers to learning/adherence to lifestyle change: none  Demonstrated degree of understanding via:  Teach Back   Monitoring/Evaluation:  Dietary intake, exercise, and body weight prn.

## 2017-03-26 NOTE — Patient Instructions (Addendum)
Talk to your doctor about urgent need to urinate Consider adding more beans (dry) into your diet Consider having almonds for breakfast with your tea (try 1/2 the sweetener) Aim to have balanced meals and snacks  (I believe you had Nissen fundoplication for acid reflux)

## 2017-03-27 NOTE — Telephone Encounter (Signed)
Thank you :)

## 2017-04-02 DIAGNOSIS — F3132 Bipolar disorder, current episode depressed, moderate: Secondary | ICD-10-CM | POA: Diagnosis not present

## 2017-04-02 DIAGNOSIS — F411 Generalized anxiety disorder: Secondary | ICD-10-CM | POA: Diagnosis not present

## 2017-04-04 DIAGNOSIS — F411 Generalized anxiety disorder: Secondary | ICD-10-CM | POA: Diagnosis not present

## 2017-04-04 DIAGNOSIS — F410 Panic disorder [episodic paroxysmal anxiety] without agoraphobia: Secondary | ICD-10-CM | POA: Diagnosis not present

## 2017-04-09 DIAGNOSIS — F319 Bipolar disorder, unspecified: Secondary | ICD-10-CM | POA: Diagnosis not present

## 2017-04-09 DIAGNOSIS — F411 Generalized anxiety disorder: Secondary | ICD-10-CM | POA: Diagnosis not present

## 2017-04-18 DIAGNOSIS — F411 Generalized anxiety disorder: Secondary | ICD-10-CM | POA: Diagnosis not present

## 2017-04-18 DIAGNOSIS — F410 Panic disorder [episodic paroxysmal anxiety] without agoraphobia: Secondary | ICD-10-CM | POA: Diagnosis not present

## 2017-04-19 DIAGNOSIS — I959 Hypotension, unspecified: Secondary | ICD-10-CM | POA: Diagnosis not present

## 2017-04-19 DIAGNOSIS — E860 Dehydration: Secondary | ICD-10-CM | POA: Diagnosis not present

## 2017-04-19 DIAGNOSIS — R5383 Other fatigue: Secondary | ICD-10-CM | POA: Diagnosis not present

## 2017-04-23 ENCOUNTER — Telehealth: Payer: Self-pay | Admitting: *Deleted

## 2017-04-23 NOTE — Telephone Encounter (Signed)
Patient called 2 x this morning to speak with Sharyn Lull RN would not tell why just to pleeeease have her return the call when she can. Advised she is seeing patients at this time.

## 2017-04-24 ENCOUNTER — Emergency Department (HOSPITAL_COMMUNITY)
Admission: EM | Admit: 2017-04-24 | Discharge: 2017-04-24 | Disposition: A | Discharge status: Home / Self Care | Payer: Medicare Other | Attending: Emergency Medicine | Admitting: Emergency Medicine

## 2017-04-24 ENCOUNTER — Telehealth: Payer: Self-pay | Admitting: *Deleted

## 2017-04-24 ENCOUNTER — Encounter (HOSPITAL_COMMUNITY): Payer: Self-pay | Admitting: *Deleted

## 2017-04-24 DIAGNOSIS — Z5321 Procedure and treatment not carried out due to patient leaving prior to being seen by health care provider: Secondary | ICD-10-CM | POA: Diagnosis not present

## 2017-04-24 DIAGNOSIS — R42 Dizziness and giddiness: Secondary | ICD-10-CM | POA: Diagnosis not present

## 2017-04-24 LAB — CBC
HCT: 38.8 % (ref 36.0–46.0)
HEMOGLOBIN: 12.5 g/dL (ref 12.0–15.0)
MCH: 28.4 pg (ref 26.0–34.0)
MCHC: 32.2 g/dL (ref 30.0–36.0)
MCV: 88.2 fL (ref 78.0–100.0)
Platelets: 225 10*3/uL (ref 150–400)
RBC: 4.4 MIL/uL (ref 3.87–5.11)
RDW: 14.5 % (ref 11.5–15.5)
WBC: 4.7 10*3/uL (ref 4.0–10.5)

## 2017-04-24 LAB — BASIC METABOLIC PANEL
ANION GAP: 6 (ref 5–15)
BUN: 11 mg/dL (ref 6–20)
CALCIUM: 9.3 mg/dL (ref 8.9–10.3)
CO2: 26 mmol/L (ref 22–32)
Chloride: 108 mmol/L (ref 101–111)
Creatinine, Ser: 1.08 mg/dL — ABNORMAL HIGH (ref 0.44–1.00)
GFR calc non Af Amer: >60 mL/min (ref >60)
Glucose, Bld: 87 mg/dL (ref 65–99)
Potassium: 4.1 mmol/L (ref 3.5–5.1)
SODIUM: 140 mmol/L (ref 135–145)

## 2017-04-24 NOTE — Telephone Encounter (Signed)
Patient is asking for help with pain for her back. She has been feeling light headed with dizziness. She also reports continued bouts of diarrhea.  She states she went to her PCP, did not feel they were helpful. She states she was told that her blood sugar was normal (86), her blood pressure was normal (130/80), but her "tongue was dry and pale, so you must be dehydrated. Drink plenty of fluids and this will go away." Patient asked if Dr Johnnye Sima had any appointments available today to help her manage her back pain, address her dizziness and diarrhea. RN advised patient that his next available is in 2 weeks, asked her to call her PCP back today for an opportunity to discuss her concerns.  Patient agreed. Landis Gandy, RN

## 2017-04-24 NOTE — ED Triage Notes (Signed)
Pt reports being dizzy x 1 week. Was going to pcp today due to the dizziness and then had fall down several steps. Denies loc. Has headache and back pain.

## 2017-05-06 DIAGNOSIS — F411 Generalized anxiety disorder: Secondary | ICD-10-CM | POA: Diagnosis not present

## 2017-05-06 DIAGNOSIS — F319 Bipolar disorder, unspecified: Secondary | ICD-10-CM | POA: Diagnosis not present

## 2017-05-10 DIAGNOSIS — F419 Anxiety disorder, unspecified: Secondary | ICD-10-CM | POA: Diagnosis not present

## 2017-05-14 DIAGNOSIS — F410 Panic disorder [episodic paroxysmal anxiety] without agoraphobia: Secondary | ICD-10-CM | POA: Diagnosis not present

## 2017-05-14 DIAGNOSIS — F411 Generalized anxiety disorder: Secondary | ICD-10-CM | POA: Diagnosis not present

## 2017-05-16 ENCOUNTER — Other Ambulatory Visit: Payer: Self-pay | Admitting: Infectious Diseases

## 2017-05-16 DIAGNOSIS — B20 Human immunodeficiency virus [HIV] disease: Secondary | ICD-10-CM

## 2017-05-20 ENCOUNTER — Emergency Department (HOSPITAL_COMMUNITY): Payer: Medicare Other

## 2017-05-20 ENCOUNTER — Emergency Department (HOSPITAL_COMMUNITY)
Admission: EM | Admit: 2017-05-20 | Discharge: 2017-05-20 | Disposition: A | Discharge status: Home / Self Care | Payer: Medicare Other | Attending: Emergency Medicine | Admitting: Emergency Medicine

## 2017-05-20 DIAGNOSIS — M25571 Pain in right ankle and joints of right foot: Secondary | ICD-10-CM | POA: Diagnosis not present

## 2017-05-20 DIAGNOSIS — J45909 Unspecified asthma, uncomplicated: Secondary | ICD-10-CM | POA: Insufficient documentation

## 2017-05-20 DIAGNOSIS — Y9302 Activity, running: Secondary | ICD-10-CM | POA: Diagnosis not present

## 2017-05-20 DIAGNOSIS — Y999 Unspecified external cause status: Secondary | ICD-10-CM | POA: Diagnosis not present

## 2017-05-20 DIAGNOSIS — S93401A Sprain of unspecified ligament of right ankle, initial encounter: Secondary | ICD-10-CM | POA: Diagnosis not present

## 2017-05-20 DIAGNOSIS — Z91040 Latex allergy status: Secondary | ICD-10-CM | POA: Insufficient documentation

## 2017-05-20 DIAGNOSIS — S99921A Unspecified injury of right foot, initial encounter: Secondary | ICD-10-CM | POA: Diagnosis not present

## 2017-05-20 DIAGNOSIS — Z79899 Other long term (current) drug therapy: Secondary | ICD-10-CM | POA: Insufficient documentation

## 2017-05-20 DIAGNOSIS — X509XXA Other and unspecified overexertion or strenuous movements or postures, initial encounter: Secondary | ICD-10-CM | POA: Insufficient documentation

## 2017-05-20 DIAGNOSIS — Y929 Unspecified place or not applicable: Secondary | ICD-10-CM | POA: Diagnosis not present

## 2017-05-20 DIAGNOSIS — S99911A Unspecified injury of right ankle, initial encounter: Secondary | ICD-10-CM | POA: Diagnosis not present

## 2017-05-20 DIAGNOSIS — M79671 Pain in right foot: Secondary | ICD-10-CM | POA: Diagnosis not present

## 2017-05-20 MED ORDER — IBUPROFEN 400 MG PO TABS
600.0000 mg | ORAL_TABLET | Freq: Once | ORAL | Status: AC
  Administered 2017-05-20: 14:00:00 600 mg via ORAL
  Filled 2017-05-20: qty 1

## 2017-05-20 MED ORDER — TRAMADOL HCL 50 MG PO TABS: 50.0000 mg | ORAL_TABLET | Freq: Four times a day (QID) | ORAL | 0 refills | Status: DC | PRN

## 2017-05-20 MED ORDER — OXYCODONE-ACETAMINOPHEN 5-325 MG PO TABS
1.0000 | ORAL_TABLET | Freq: Once | ORAL | Status: AC
  Administered 2017-05-20: 1 via ORAL
  Filled 2017-05-20: qty 1

## 2017-05-20 NOTE — ED Triage Notes (Signed)
Pt states was running Saturday felt a pain in her foot and ankle. Now with continued pain with walking. Has tried heat and brace prior to arrival with little improvement.

## 2017-06-01 ENCOUNTER — Other Ambulatory Visit: Payer: Self-pay | Admitting: Infectious Diseases

## 2017-06-01 DIAGNOSIS — B20 Human immunodeficiency virus [HIV] disease: Secondary | ICD-10-CM

## 2017-06-07 NOTE — ED Provider Notes (Signed)
[facility] DEPT Provider Note   CSN: [id number] Arrival date & time: 05/20/17  9935     History   Chief Complaint Chief Complaint  Patient presents with  . Foot Pain    HPI Danielle [name] is a 46 y.o. female.  HPI  46 year old female with right foot pain. Onset while running on Saturday. Denies any discrete trauma. She has felt a sharp pain settling as she was running. She has continued pain since then. Is worse with bearing weight. No numbness or tingling. Mild swelling. She's been trying both he and icing without much improvement.  Past Medical History:  Diagnosis Date  . Anemia   . Anxiety   . Asthma   . Bulging lumbar disc   . Chronic back pain   . Depression   . Genital HSV   . GERD (gastroesophageal reflux disease)   . Hepatitis   . HIV infection ([place])   . Migraines   . Pneumonia     Patient Active Problem List   Diagnosis Date Noted  . Numerous moles 02/18/2017  . Hyperglycemia 02/18/2017  . Hepatitis B immune 02/18/2017  . 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 S/P total abdominal hysterectomy 06/28/2015  . Abnormal uterine bleeding (AUB) 05/03/2015  . Condyloma acuminatum 02/23/2015  . Fibroids 01/05/2015  . Urinary incontinence 01/05/2015  . Abnormal finding on GI tract imaging 10/12/2013  . Anemia-chronic 10/09/2013  . Achalasia s/p laparoscopic Heller myotomy and Dor fundoplication 70/17/7939  . Ganglion cyst of finger of right hand 07/06/2013  . Trichomoniasis of vagina 06/03/2013  . GERD (gastroesophageal reflux disease) 03/16/2013  . Depression 02/10/2009  . Human immunodeficiency virus (HIV) disease ([place]) 03/24/2007  . Anxiety state 03/24/2007  . ASTHMA 03/24/2007    Past Surgical History:  Procedure Laterality Date  . ABDOMINAL HYSTERECTOMY N/A 06/28/2015   Procedure: HYSTERECTOMY ABDOMINAL;  Surgeon: Osborne Oman, MD;  Location: [facility];  Service: Gynecology;  Laterality: N/A;  Requested 06/28/15 @ 7:30a  . BILATERAL  SALPINGECTOMY Bilateral 06/28/2015   Procedure: BILATERAL SALPINGECTOMY;  Surgeon: Osborne Oman, MD;  Location: [facility];  Service: Gynecology;  Laterality: Bilateral;  . CESAREAN SECTION     twice;  vertical incision x 2  . COLONOSCOPY N/A 10/12/2013   Procedure: COLONOSCOPY;  Surgeon: Jerene Bears, MD;  Location: [facility];  Service: Endoscopy;  Laterality: N/A;  . ESOPHAGEAL MANOMETRY N/A 06/29/2013   Procedure: ESOPHAGEAL MANOMETRY (EM);  Surgeon: Jerene Bears, MD;  Location: [facility];  Service: Gastroenterology;  Laterality: N/A;  . HELLER MYOTOMY N/A 10/08/2013   Procedure: LAPAROSCOPIC HELLER MYOTOMY, DOR FUNDIPLICATION, UPPER ENDOSCOPY;  Surgeon: Odis Hollingshead, MD;  Location: [facility];  Service: General;  Laterality: N/A;  . HERNIA REPAIR    . HYSTEROSCOPY WITH RESECTOSCOPE N/A 05/03/2015   Procedure: Diagnostic Hysteroscopy;  Surgeon: Lavonia Drafts, MD;  Location: [facility];  Service: Gynecology;  Laterality: N/A;  wants Novasure and myosure.  Marland Kitchen LYSIS OF ADHESION  06/28/2015   Procedure: LYSIS OF ADHESION;  Surgeon: Osborne Oman, MD;  Location: [facility];  Service: Gynecology;;  . OTHER SURGICAL HISTORY     biopsy axillary ,right arm  . right arm biopsy    . UPPER GI ENDOSCOPY N/A 10/08/2013   Procedure: UPPER GI ENDOSCOPY;  Surgeon: Odis Hollingshead, MD;  Location: [facility];  Service: General;  Laterality: N/A;  . WISDOM TOOTH EXTRACTION      OB History    Gravida Para Term Preterm AB Living  5 3 3   2 3    SAB TAB Ectopic Multiple Live Births   2               Home Medications    Prior to Admission medications   Medication Sig Start Date End Date Taking? Authorizing Provider  benzonatate (TESSALON) 100 MG capsule Take 2 capsules (200 mg total) by mouth 3 (three) times daily as needed for cough. Patient not taking: Reported on 02/18/2017 01/02/17   Lysbeth Penner, FNP  cetirizine (ZYRTEC) 10 MG tablet TK 1 T PO  D 01/25/17   [provider]    cyclobenzaprine (FLEXERIL) 10 MG tablet Take 10 mg by mouth 3 (three) times daily as needed for muscle spasms.    [provider]  DESCOVY 200-25 MG tablet TAKE 1 TABLET BY MOUTH DAILY 05/16/17   Campbell Riches, MD  mirabegron ER (MYRBETRIQ) 50 MG TB24 tablet Take 50 mg by mouth daily.    [provider]  PROAIR HFA 108 (90 BASE) MCG/ACT inhaler Inhale 2 puffs into the lungs every 4 (four) hours as needed for wheezing or shortness of breath. Shortness of breath/wheezing 12/31/14   [provider]  Doug Sou 90 MCG/ACT inhaler INHALE 2 PUFFS BY INHALATION ROUTE BID 02/08/17   [provider]  ranitidine (ZANTAC) 150 MG tablet TAKE 1 TABLET BY MOUTH EVERY NIGHT AT QASTMHD(62 HOURS FROM REYETAZ) 01/29/17   Campbell Riches, MD  sertraline (ZOLOFT) 25 MG tablet TK 1 T PO HS FOR ANXIETY 02/14/17   [provider]  SUMAtriptan (IMITREX) 100 MG tablet Take 1 tablet (100 mg total) by mouth every 2 (two) hours as needed for migraine. May repeat in 2 hours if headache persists or recurs. 01/02/17   Lysbeth Penner, FNP  TIVICAY 50 MG tablet TAKE 1 TABLET BY MOUTH EVERY EVENING 06/03/17   Campbell Riches, MD  traMADol (ULTRAM) 50 MG tablet Take 1 tablet (50 mg total) by mouth every 6 (six) hours as needed. 05/20/17   Virgel Manifold, MD  valACYclovir (VALTREX) 500 MG tablet TAKE 1 TABLET(500 MG) BY MOUTH DAILY 12/26/16   Tommy Medal, Lavell Islam, MD    Family History Family History  Problem Relation Age of Onset  . Hyperlipidemia Mother   . Diabetes Mother   . Stroke Mother   . Diabetes Brother   . Colon cancer Father   . Esophageal cancer Neg Hx   . Rectal cancer Neg Hx   . Stomach cancer Neg Hx     Social History Social History  Substance Use Topics  . Smoking status: Never Smoker  . Smokeless tobacco: Never Used  . Alcohol use No     Allergies   Latex   Review of Systems Review of Systems  All systems reviewed and negative, other  than as noted in HPI.  Physical Exam Updated Vital Signs BP 124/69 (BP Location: Right Arm)   Pulse 71   Temp 99.7 F (37.6 C) (Oral)   Resp 18   Ht 5\' 1"  (1.549 m)   Wt 74.8 kg (165 lb)   LMP  (LMP Unknown)   SpO2 98%   BMI 31.18 kg/m   Physical Exam  Constitutional: She appears well-developed and well-nourished. No distress.  HENT:  Head: Normocephalic and atraumatic.  Eyes: Conjunctivae are normal. Right eye exhibits no discharge. Left eye exhibits no discharge.  Neck: Neck supple.  Cardiovascular: Normal rate, regular rhythm and normal heart sounds.  Exam reveals  no gallop and no friction rub.   No murmur heard. Pulmonary/Chest: Effort normal and breath sounds normal. No respiratory distress.  Abdominal: Soft. She exhibits no distension. There is no tenderness.  Musculoskeletal: She exhibits edema and tenderness.  Mild swelling diffusely of the right foot. There is point tenderness across the talar dome in the lateral malleolus. Palpable DP pulse. Moves all toes. Sensation intact to light touch.  Neurological: She is alert.  Skin: Skin is warm and dry.  Psychiatric: She has a normal mood and affect. Her behavior is normal. Thought content normal.  Nursing note and vitals reviewed.    ED Treatments / Results  Labs (all labs ordered are listed, but only abnormal results are displayed) Labs Reviewed - No data to display  EKG  EKG Interpretation None       Radiology No results found.   Dg Ankle Complete Right  Result Date: 05/20/2017 CLINICAL DATA:  Right foot pain after injury last Saturday. Initial encounter. EXAM: RIGHT ANKLE - COMPLETE 3+ VIEW COMPARISON:  08/27/2012 FINDINGS: There is no evidence of fracture, dislocation, or joint effusion. There is no evidence of arthropathy or other focal bone abnormality. Soft tissues are unremarkable. IMPRESSION: Negative. Electronically Signed   By: Monte Fantasia M.D.   On: 05/20/2017 10:53   Dg Foot Complete  Right  Result Date: 05/20/2017 CLINICAL DATA:  Diffuse foot pain after injury running outside on Saturday. Initial encounter. EXAM: RIGHT FOOT COMPLETE - 3+ VIEW COMPARISON:  04/24/2006 FINDINGS: There is no evidence of fracture or dislocation. There is no evidence of arthropathy or other focal bone abnormality. Soft tissues are unremarkable. IMPRESSION: Negative. Electronically Signed   By: Monte Fantasia M.D.   On: 05/20/2017 10:53    Procedures Procedures (including critical care time)  Medications Ordered in ED Medications  ibuprofen (ADVIL,MOTRIN) tablet 600 mg (600 mg Oral Given 05/20/17 1350)  oxyCODONE-acetaminophen (PERCOCET/ROXICET) 5-325 MG per tablet 1 tablet (1 tablet Oral Given 05/20/17 1351)     Initial Impression / Assessment and Plan / ED Course  I have reviewed the triage vital signs and the nursing notes.  Pertinent labs & imaging results that were available during my care of the patient were reviewed by me and considered in my medical decision making (see chart for details).     46 year old female with foot/ankle sprain. Negative imaging. [place]. Return precautions discussed. Sports medicine or though follow-up for persistent symptoms  Final Clinical Impressions(s) / ED Diagnoses   Final diagnoses:  Sprain of right ankle, unspecified ligament, initial encounter    New Prescriptions Discharge Medication List as of 05/20/2017  1:43 PM    START taking these medications   Details  traMADol (ULTRAM) 50 MG tablet Take 1 tablet (50 mg total) by mouth every 6 (six) hours as needed., Starting Mon 05/20/2017, Print         Virgel Manifold, MD 06/07/17 1303

## 2017-06-12 ENCOUNTER — Emergency Department (HOSPITAL_COMMUNITY)
Admission: EM | Admit: 2017-06-12 | Discharge: 2017-06-12 | Disposition: A | Discharge status: Home / Self Care | Payer: Medicare Other | Attending: Emergency Medicine | Admitting: Emergency Medicine

## 2017-06-12 ENCOUNTER — Encounter (HOSPITAL_COMMUNITY): Payer: Self-pay

## 2017-06-12 DIAGNOSIS — J45909 Unspecified asthma, uncomplicated: Secondary | ICD-10-CM | POA: Diagnosis not present

## 2017-06-12 DIAGNOSIS — B20 Human immunodeficiency virus [HIV] disease: Secondary | ICD-10-CM | POA: Diagnosis not present

## 2017-06-12 DIAGNOSIS — M79671 Pain in right foot: Secondary | ICD-10-CM | POA: Insufficient documentation

## 2017-06-12 DIAGNOSIS — M25571 Pain in right ankle and joints of right foot: Secondary | ICD-10-CM | POA: Diagnosis not present

## 2017-06-12 DIAGNOSIS — Z79899 Other long term (current) drug therapy: Secondary | ICD-10-CM | POA: Insufficient documentation

## 2017-06-12 NOTE — ED Notes (Signed)
Ortho tech paged for splint.

## 2017-06-12 NOTE — Progress Notes (Signed)
Orthopedic Tech Progress Note Patient Details:  Danielle [name] March 09, 1971 [id number]  Ortho Devices Type of Ortho Device: ASO Ortho Device/Splint Location: RLE Ortho Device/Splint Interventions: Ordered, Application   Braulio Bosch 06/12/2017, 6:19 PM

## 2017-06-12 NOTE — ED Triage Notes (Signed)
Patient request recheck of right ankle after sprain in September. Used crutches with minimal relief. Denies new trauma

## 2017-06-12 NOTE — Discharge Instructions (Signed)
You can wear the ankle brace as needed for comfort. Please only use the crutches for severe pain. The more use the crutches, the longer it may take your injury to heal because you're not continuing to strengthen the muscles in the ankle and foot. 650 mg of Tylenol may be taken every 6 hours as needed for pain control. Please do not use other medications over-the-counter that may also controlled in Tylenol.  Please call and follow up with [facility] if you do not note any improvement in her symptoms in the next few weeks.  If you develop any new or worsening symptoms, including weakness in the foot, if the foot becomes red, hot, or swollen, or if you have a new injury, please return to the emergency department for evaluation.

## 2017-06-12 NOTE — ED Provider Notes (Signed)
[facility] DEPT Provider Note   CSN: [id number] Arrival date & time: 06/12/17  1420     History   Chief Complaint No chief complaint on file.   HPI Danielle [name] is a 46 y.o. female who presents to the Emergency Department with a chief complaint of right foot and ankle pain. The patient reports the pain began suddenly in early September while she was running out of the house. She was concerned she stepped on stepped on gravel. She states that she doesn't recall if she rolled her ankle. She denies falling. She was evaluated in the emergency Department on September 10. Imaging of the right foot and ankle was unremarkable for fracture. She was discharged home with crutches and to follow-up to sports medicine. The patient states that she did not follow-up with sports medicine.  She reports that she return to the emergency room today for continued pain that is aggravated with weightbearing and wearing shoes. She states that she is unable to pinpoint where the pain begins, but states that her entire right foot is painful., Which she characterizes as sharp. She reports that until this week she was constantly ambulating with crutches. She is able to bear weight and ambulate without crutches, but states that it is more painful. No recent fever, chills, or new injuries. She has not been treating her pain with medication at home.  Past medical history includes prediabetes and HIV. No history of neuropathy.   The history is provided by the patient. No language interpreter was used.    Past Medical History:  Diagnosis Date  . Anemia   . Anxiety   . Asthma   . Bulging lumbar disc   . Chronic back pain   . Depression   . Genital HSV   . GERD (gastroesophageal reflux disease)   . Hepatitis   . HIV infection ([place])   . Migraines   . Pneumonia     Patient Active Problem List   Diagnosis Date Noted  . Numerous moles 02/18/2017  . Hyperglycemia 02/18/2017  . Hepatitis B immune 02/18/2017   . 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 S/P total abdominal hysterectomy 06/28/2015  . Abnormal uterine bleeding (AUB) 05/03/2015  . Condyloma acuminatum 02/23/2015  . Fibroids 01/05/2015  . Urinary incontinence 01/05/2015  . Abnormal finding on GI tract imaging 10/12/2013  . Anemia-chronic 10/09/2013  . Achalasia s/p laparoscopic Heller myotomy and Dor fundoplication 62/13/0865  . Ganglion cyst of finger of right hand 07/06/2013  . Trichomoniasis of vagina 06/03/2013  . GERD (gastroesophageal reflux disease) 03/16/2013  . Depression 02/10/2009  . Human immunodeficiency virus (HIV) disease ([place]) 03/24/2007  . Anxiety state 03/24/2007  . ASTHMA 03/24/2007    Past Surgical History:  Procedure Laterality Date  . ABDOMINAL HYSTERECTOMY N/A 06/28/2015   Procedure: HYSTERECTOMY ABDOMINAL;  Surgeon: Osborne Oman, MD;  Location: [facility];  Service: Gynecology;  Laterality: N/A;  Requested 06/28/15 @ 7:30a  . BILATERAL SALPINGECTOMY Bilateral 06/28/2015   Procedure: BILATERAL SALPINGECTOMY;  Surgeon: Osborne Oman, MD;  Location: [facility];  Service: Gynecology;  Laterality: Bilateral;  . CESAREAN SECTION     twice;  vertical incision x 2  . COLONOSCOPY N/A 10/12/2013   Procedure: COLONOSCOPY;  Surgeon: Jerene Bears, MD;  [facility];  Service: Endoscopy;  Laterality: N/A;  . ESOPHAGEAL MANOMETRY N/A 06/29/2013   Procedure: ESOPHAGEAL MANOMETRY (EM);  Surgeon: Jerene Bears, MD;  [facility]: [facility];  Service: Gastroenterology;  Laterality: N/A;  . HELLER MYOTOMY  N/A 10/08/2013   Procedure: LAPAROSCOPIC HELLER MYOTOMY, DOR FUNDIPLICATION, UPPER ENDOSCOPY;  Surgeon: Odis Hollingshead, MD;  Location: [facility];  Service: General;  Laterality: N/A;  . HERNIA REPAIR    . HYSTEROSCOPY WITH RESECTOSCOPE N/A 05/03/2015   Procedure: Diagnostic Hysteroscopy;  Surgeon: Lavonia Drafts, MD;  Location: [facility];  Service: Gynecology;  Laterality: N/A;  wants Novasure and myosure.  Marland Kitchen LYSIS  OF ADHESION  06/28/2015   Procedure: LYSIS OF ADHESION;  Surgeon: Osborne Oman, MD;  Location: [facility];  Service: Gynecology;;  . OTHER SURGICAL HISTORY     biopsy axillary ,right arm  . right arm biopsy    . UPPER GI ENDOSCOPY N/A 10/08/2013   Procedure: UPPER GI ENDOSCOPY;  Surgeon: Odis Hollingshead, MD;  Location: [facility];  Service: General;  Laterality: N/A;  . WISDOM TOOTH EXTRACTION      OB History    Gravida Para Term Preterm AB Living   5 3 3   2 3    SAB TAB Ectopic Multiple Live Births   2               Home Medications    Prior to Admission medications   Medication Sig Start Date End Date Taking? Authorizing Provider  benzonatate (TESSALON) 100 MG capsule Take 2 capsules (200 mg total) by mouth 3 (three) times daily as needed for cough. Patient not taking: Reported on 02/18/2017 01/02/17   Lysbeth Penner, FNP  cetirizine (ZYRTEC) 10 MG tablet TK 1 T PO  D 01/25/17   [provider]  cyclobenzaprine (FLEXERIL) 10 MG tablet Take 10 mg by mouth 3 (three) times daily as needed for muscle spasms.    [provider]  DESCOVY 200-25 MG tablet TAKE 1 TABLET BY MOUTH DAILY 05/16/17   Campbell Riches, MD  mirabegron ER (MYRBETRIQ) 50 MG TB24 tablet Take 50 mg by mouth daily.    [provider]  PROAIR HFA 108 (90 BASE) MCG/ACT inhaler Inhale 2 puffs into the lungs every 4 (four) hours as needed for wheezing or shortness of breath. Shortness of breath/wheezing 12/31/14   [provider]  Doug Sou 90 MCG/ACT inhaler INHALE 2 PUFFS BY INHALATION ROUTE BID 02/08/17   [provider]  ranitidine (ZANTAC) 150 MG tablet TAKE 1 TABLET BY MOUTH EVERY NIGHT AT [id number](60 HOURS FROM REYETAZ) 01/29/17   Campbell Riches, MD  sertraline (ZOLOFT) 25 MG tablet TK 1 T PO HS FOR ANXIETY 02/14/17   [provider]  SUMAtriptan (IMITREX) 100 MG tablet Take 1 tablet (100 mg total) by mouth every 2 (two) hours as needed for migraine. May  repeat in 2 hours if headache persists or recurs. 01/02/17   Lysbeth Penner, FNP  TIVICAY 50 MG tablet TAKE 1 TABLET BY MOUTH EVERY EVENING 06/03/17   Campbell Riches, MD  traMADol (ULTRAM) 50 MG tablet Take 1 tablet (50 mg total) by mouth every 6 (six) hours as needed. 05/20/17   Virgel Manifold, MD  valACYclovir (VALTREX) 500 MG tablet TAKE 1 TABLET(500 MG) BY MOUTH DAILY 12/26/16   Tommy Medal, Lavell Islam, MD    Family History Family History  Problem Relation Age of Onset  . Hyperlipidemia Mother   . Diabetes Mother   . Stroke Mother   . Diabetes Brother   . Colon cancer Father   . Esophageal cancer Neg Hx   . Rectal cancer Neg Hx   . Stomach cancer Neg Hx  Social History Social History  Substance Use Topics  . Smoking status: Never Smoker  . Smokeless tobacco: Never Used  . Alcohol use No     Allergies   Latex   Review of Systems Review of Systems  Constitutional: Negative for fever.  Musculoskeletal: Positive for arthralgias, gait problem and myalgias. Negative for joint swelling.  Skin: Negative for wound.  Allergic/Immunologic: Positive for immunocompromised state.   Physical Exam Updated Vital Signs BP (!) 150/77   Pulse 89   Temp 99 F (37.2 C) (Oral)   Resp 18   LMP  (LMP Unknown)   SpO2 98%   Physical Exam  Constitutional: No distress.  HENT:  Head: Normocephalic.  Eyes: Conjunctivae are normal.  Neck: Neck supple.  Cardiovascular: Normal rate and regular rhythm.  Exam reveals no gallop and no friction rub.   No murmur heard. Pulmonary/Chest: Effort normal. No respiratory distress.  Abdominal: Soft. She exhibits no distension.  Musculoskeletal:  No focal tenderness to palpation over the right foot or ankle. Able to move all toes independently. Full passive range of motion of the right ankle. Decreased active range of motion secondary to pain. Sensation is intact throughout the right foot and lower leg. 2+ DP PT pulses. No overlying ecchymosis,  erythema, edema or warmth. No evidence of any puncture wounds.  Able to bear weight on the bilateral lower extremities. Antalgic gait.  Neurological: She is alert.  Skin: Skin is warm. No rash noted. No erythema. No pallor.  Psychiatric: Her behavior is normal.  Nursing note and vitals reviewed.  ED Treatments / Results  Labs (all labs ordered are listed, but only abnormal results are displayed) Labs Reviewed - No data to display  EKG  EKG Interpretation None       Radiology No results found.  Procedures Procedures (including critical care time)  Medications Ordered in ED Medications - No data to display   Initial Impression / Assessment and Plan / ED Course  I have reviewed the triage vital signs and the nursing notes.  Pertinent labs & imaging results that were available during my care of the patient were reviewed by me and considered in my medical decision making (see chart for details).     46 year old female presenting for re-evaluation of right foot pain after a mechanical injury that initially occurred at 4 weeks ago. Imaging after the initial injury was negative. On exam, no focal tenderness. NVI. No concern for gout, cellulitis, or osteomyelitis at this time. At this time, repeat imaging is not necessary. Encourage the patient to weight bear as much as possible and to strengthen the foot and ankle with exercises. Referral to sports medicine provided. ASO provided in the ED. Strict return precautions given. No acute distress. The patient states for discharge at this time.  Final Clinical Impressions(s) / ED Diagnoses   Final diagnoses:  Right foot pain    New Prescriptions New Prescriptions   No medications on file     Joanne Gavel, PA-C 06/12/17 1759    Sherwood Gambler, MD 06/12/17 2334

## 2017-06-18 ENCOUNTER — Other Ambulatory Visit: Payer: Self-pay | Admitting: Infectious Diseases

## 2017-06-18 ENCOUNTER — Ambulatory Visit (INDEPENDENT_AMBULATORY_CARE_PROVIDER_SITE_OTHER): Payer: Medicare Other | Admitting: Family Medicine

## 2017-06-18 VITALS — BP 108/64 | Ht 61.0 in | Wt 165.0 lb

## 2017-06-18 DIAGNOSIS — M25571 Pain in right ankle and joints of right foot: Secondary | ICD-10-CM

## 2017-06-18 DIAGNOSIS — K219 Gastro-esophageal reflux disease without esophagitis: Secondary | ICD-10-CM

## 2017-06-20 NOTE — Progress Notes (Signed)
  Danielle [name] - 46 y.o. female MRN [id number]  Date of birth: 1971/05/06    SUBJECTIVE:      Chief Complaint:/ HPI:   Right ankle pain 1 month. Was running in her house she felt an unusual sensation in her right ankle. Continued activities that day but the ankle hurt much more the next day and has really not gotten any better since. She which the emergency department and had x-rays which were negative. I gave her crutches but she did not have to use them so she did not. She went back to the emergency room and they gave her some type of ankle brace which she says she has not used because it makes the ankle her more.  Ankle pain is anterior medial more than lateral. She has pain with dorsiflexion and plantarflexion. It feels stiff. It aches all the time. Weightbearing makes it much worse. Climbing stairs is the most difficult activity. Has had ankle sprains before but never one this serious.   ROS:     No unusual weight change, no fever. She's had no other unusual arthralgias or myalgias. Has had no skin rash.  PERTINENT  PMH / PSH FH / / SH:  Past Medical, Surgical, Social, and Family History Reviewed & Updated in the EMR.  Pertinent findings include:  HIV Hyperglycemia History of anxiety and depression History of anemia History of asthma  OBJECTIVE: BP 108/64   Ht 5\' 1"  (1.549 m)   Wt 165 lb (74.8 kg)   LMP  (LMP Unknown)   BMI 31.18 kg/m   Physical Exam:  Vital signs are reviewed. GEN.: Well-developed female, overweight, no acute distress ANKLES: Symmetrical: Left ankle is tender to palpation anterior medially greater than anterior lateral. There is no ankle bony defect. There is very slight soft tissue swelling on both ankles in the area over the ATF but it is not tender here on the left ankle. Anterior drawer is symmetrical and has a firm endpoint. Inversion eversion have full range of motion and normal strength. She has some mild pain with inversion. Dorsiflexion and  plantarflexion are full range of motion and normal strength. She does have some mild pain with dorsiflexion. There is no ankle effusion noted SKIN: No erythema. No rash. Normal temperature VASCULAR: Dorsalis pedis and posterior tibialis pulses are 2+ bilaterally symmetrical. Capillary refill is normal. NEURO: Sensation to soft touch is intact bilateral feet.  IMAGING right ankle and right foot x-rays from 05/20/2017. I have reviewed the images with the patient. I agree with report. See no acute pathology. There is small opacity in the talus consistent with bone island.  ASSESSMENT & PLAN:  Ankle pain for greater than 4 weeks after unusual axial loading injury. The only thing I can think of that might account for her pain would be an osteochondral defect some poor in the talar dome area. Do not see that on x-ray. He would only be visible on MRI. As she's had absolute no improvement with conservative measures, I think we need to proceed with MRI we'll set that up. I'll see her in follow-up in the office a day or 2 after that.

## 2017-06-24 ENCOUNTER — Ambulatory Visit (INDEPENDENT_AMBULATORY_CARE_PROVIDER_SITE_OTHER): Payer: Medicare Other | Admitting: Infectious Diseases

## 2017-06-24 ENCOUNTER — Encounter: Payer: Self-pay | Admitting: Infectious Diseases

## 2017-06-24 VITALS — BP 105/72 | HR 84 | Temp 98.9°F | Wt 168.0 lb

## 2017-06-24 DIAGNOSIS — Z79899 Other long term (current) drug therapy: Secondary | ICD-10-CM

## 2017-06-24 DIAGNOSIS — F329 Major depressive disorder, single episode, unspecified: Secondary | ICD-10-CM | POA: Diagnosis not present

## 2017-06-24 DIAGNOSIS — S93401S Sprain of unspecified ligament of right ankle, sequela: Secondary | ICD-10-CM | POA: Diagnosis not present

## 2017-06-24 DIAGNOSIS — D249 Benign neoplasm of unspecified breast: Secondary | ICD-10-CM | POA: Diagnosis not present

## 2017-06-24 DIAGNOSIS — Z113 Encounter for screening for infections with a predominantly sexual mode of transmission: Secondary | ICD-10-CM | POA: Diagnosis not present

## 2017-06-24 DIAGNOSIS — B20 Human immunodeficiency virus [HIV] disease: Secondary | ICD-10-CM

## 2017-06-24 DIAGNOSIS — K219 Gastro-esophageal reflux disease without esophagitis: Secondary | ICD-10-CM | POA: Diagnosis not present

## 2017-06-24 DIAGNOSIS — M25571 Pain in right ankle and joints of right foot: Secondary | ICD-10-CM | POA: Insufficient documentation

## 2017-06-24 DIAGNOSIS — F32A Depression, unspecified: Secondary | ICD-10-CM

## 2017-06-24 NOTE — Progress Notes (Signed)
   Subjective:    Patient ID: Danielle [name], female    DOB: 1971/03/23, 46 y.o.   MRN: [id number]  HPI 46 yo FHIV+.  Additionally, she has a history of GERD, bipolar, R hand ganglion cyst removed 08-2016, and abnormal uterine bleed and hysterectomy.  Had prev mammogram 07-2015 that showed fibro-adenomas. Patient was previously on reyetaz/norvir/truvada then swtiched to tivicay-descovy.  HIV 1 RNA Quant (copies/mL)  Date Value  01/17/2017 <20 NOT DETECTED  06/13/2016 <20  11/30/2015 <20   CD4 T Cell Abs (/uL)  Date Value  01/17/2017 710  06/13/2016 590  11/30/2015 630   No problems with ART.  Has been walking but has exacerbated her back problems. States she has a slipped disc.  Had PAP 02-2017  Review of Systems  Constitutional: Negative for appetite change and unexpected weight change.  Respiratory: Positive for cough. Negative for shortness of breath.   Gastrointestinal: Negative for constipation and diarrhea.  Endocrine: Positive for polyuria.  Genitourinary: Negative for difficulty urinating, dysuria and hematuria.  Musculoskeletal: Positive for back pain.  Neurological: Positive for dizziness, numbness and headaches.  Please see HPI. All other systems reviewed and negative.  Has lost her voice.  Getting MRI of her foot after her recent R "ankle sprain"    Objective:   Physical Exam  Constitutional: She appears well-developed and well-nourished.  HENT:  Mouth/Throat: No oropharyngeal exudate.  Eyes: Pupils are equal, round, and reactive to light. EOM are normal.  Neck: Neck supple.  Cardiovascular: Normal rate, regular rhythm and normal heart sounds.   Pulmonary/Chest: Effort normal and breath sounds normal.  Abdominal: Soft. Bowel sounds are normal. There is no tenderness. There is no rebound.  Musculoskeletal: She exhibits no edema.       Feet:  Lymphadenopathy:    She has no cervical adenopathy.  Psychiatric: She has a normal mood and affect.  Vitals  reviewed.      Assessment & Plan:

## 2017-06-24 NOTE — Assessment & Plan Note (Signed)
Needs repeat mammo 03-2018

## 2017-06-24 NOTE — Assessment & Plan Note (Signed)
Still on zantac.  Has occasionally.  Not restricting diet.

## 2017-06-24 NOTE — Assessment & Plan Note (Signed)
She is doing well Husband negative. She is unclear of his last test. We spoke on phone about PREP. He is interested [phone]) Offered/refused condoms.  She refuses flu shot Will see her back in 6 months.

## 2017-06-24 NOTE — Assessment & Plan Note (Addendum)
She still sees Grayland Ormond at [facility] Worried she has bipolar.

## 2017-06-24 NOTE — Assessment & Plan Note (Signed)
Await MRI She is not staying off her foot.  She is curious if boot would help?

## 2017-06-26 ENCOUNTER — Other Ambulatory Visit: Payer: Self-pay | Admitting: Infectious Disease

## 2017-06-27 ENCOUNTER — Ambulatory Visit
Admission: RE | Admit: 2017-06-27 | Discharge: 2017-06-27 | Disposition: A | Discharge status: Home / Self Care | Payer: Medicare Other | Source: Ambulatory Visit | Attending: Family Medicine | Admitting: Family Medicine

## 2017-06-27 DIAGNOSIS — M25561 Pain in right knee: Secondary | ICD-10-CM | POA: Diagnosis not present

## 2017-06-27 DIAGNOSIS — M25571 Pain in right ankle and joints of right foot: Secondary | ICD-10-CM

## 2017-07-01 ENCOUNTER — Encounter: Payer: Self-pay | Admitting: Family Medicine

## 2017-07-02 ENCOUNTER — Ambulatory Visit (INDEPENDENT_AMBULATORY_CARE_PROVIDER_SITE_OTHER): Payer: Medicare Other | Admitting: Family Medicine

## 2017-07-02 VITALS — BP 108/68 | Ht 61.0 in | Wt 161.0 lb

## 2017-07-02 DIAGNOSIS — M25571 Pain in right ankle and joints of right foot: Secondary | ICD-10-CM | POA: Diagnosis present

## 2017-07-02 NOTE — Patient Instructions (Signed)
Dr. [facility] [address] [employer] [phone]

## 2017-07-03 ENCOUNTER — Ambulatory Visit (INDEPENDENT_AMBULATORY_CARE_PROVIDER_SITE_OTHER): Payer: Medicare Other | Admitting: *Deleted

## 2017-07-03 DIAGNOSIS — Z23 Encounter for immunization: Secondary | ICD-10-CM

## 2017-07-03 NOTE — Progress Notes (Signed)
  Danielle [name] - 46 y.o. female MRN [id number]  Date of birth: February 22, 1971    SUBJECTIVE:      Chief Complaint:/ HPI:   Follow-up right ankle pain. Pain is no better. She did have MRI completed.  Pain essentially located in the ankle. Worse with going up steps and plantar flexion/dorsiflexion. Pain 6-8 out of 10. Does bother her at night. Worse with weightbearing. Denies numbness or tingling in her feet. Has noted no unusual erythema   ROS:     See history of present illness  PERTINENT  PMH / PSH FH / / SH:  Past Medical, Surgical, Social, and Family History Reviewed & Updated in the EMR.  Pertinent findings include:  HIV infection History of hyperglycemia History of achalasia status post fundoplication   OBJECTIVE: BP 108/68   Ht 5\' 1"  (1.549 m)   Wt 161 lb (73 kg)   LMP  (LMP Unknown)   BMI 30.42 kg/m   Physical Exam:  Vital signs are reviewed. GEN.: Well-developed slightly overweight female no acute distress ANKLE: Right. She still has full range of motion flexion extension and eversion and inversion. She has pain with resisted dorsiflexion greater than resisted flexion. Anterior drawer has a normal in point. IMAGING: I reviewed the MRI is normal.  ASSESSMENT & PLAN:  See problem based charting & AVS for pt instructions.

## 2017-07-03 NOTE — Assessment & Plan Note (Signed)
Very odd presentation. I was concerned she had an OCD lesion. MRI is negative for that. I can find no other reason for her significant ankle pain. I wonder she has some reactive synovitis. A lot of this could be related to her HIV disease or not. I think at this point I'll send her to foot and ankle specialist for further evaluation.

## 2017-07-04 ENCOUNTER — Encounter (INDEPENDENT_AMBULATORY_CARE_PROVIDER_SITE_OTHER): Payer: Self-pay | Admitting: Orthopedic Surgery

## 2017-07-04 ENCOUNTER — Ambulatory Visit (INDEPENDENT_AMBULATORY_CARE_PROVIDER_SITE_OTHER): Payer: Medicare Other | Admitting: Orthopedic Surgery

## 2017-07-04 DIAGNOSIS — S93411A Sprain of calcaneofibular ligament of right ankle, initial encounter: Secondary | ICD-10-CM | POA: Diagnosis not present

## 2017-07-04 NOTE — Progress Notes (Signed)
Office Visit Note   Patient: Danielle [name]           Date of Birth: 1971/03/30           MRN: [id number] Visit Date: 07/04/2017              Requested by: Campbell Riches, MD [address] [place], [place] [address] PCP: Campbell Riches, MD  Chief Complaint  Patient presents with  . Right Ankle - Pain      HPI: Patient is a 46 year old woman with multiple medical problems who has had multiple sprains of her right ankle.  She states she has been to the emergency room twice has been given an ASO that she could not tolerate or use.  She has had an MRI scan and this is reviewed.  She is currently taking tramadol without relief.  Assessment & Plan: Visit Diagnoses:  1. Sprain of calcaneofibular ligament of right ankle, initial encounter     Plan: We will place her in a cam boot walker to further stabilize the anterior talofibular ligament recommended over-the-counter Naprosyn 220 mg 2 by mouth twice a day follow-up in 2 weeks.  Follow-Up Instructions: Return in about 2 weeks (around 07/18/2017).   Ortho Exam  Patient is alert, oriented, no adenopathy, well-dressed, normal affect, normal respiratory effort. Examination patient does have an antalgic gait.  She points to the dorsal lateral aspect of her ankle and states that this is the area of maximal pain.  She has no tenderness to palpation anteriorly of the ankle joint no signs or symptoms of impingement syndrome.  Her MRI scan was reviewed and this shows no osteochondral defect no tendon injury.  She is point tender to palpation over the anterior talofibular ligament.  Anterior drawer is stable but she does have a little bit of laxity with varus stress.  Patient has a good pulse her foot is neurovascular intact.  All patient's symptoms are consistent with a sprain of the anterior talofibular ligament.  Imaging: No results found. No images are attached to the encounter.  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 04/27/2015    Orders:  No orders of the defined types were placed in this encounter.  No orders of the defined types were placed in this encounter.    Procedures: No procedures performed  Clinical Data: No additional findings.  ROS:  All other systems negative, except as noted in the HPI. Review of Systems  Objective: Vital Signs: LMP  (LMP Unknown)   Specialty Comments:  No specialty comments available.  PMFS History: Patient Active Problem List   Diagnosis Date Noted  . Sprain of calcaneofibular ligament of right ankle 07/04/2017  . Ankle pain, right 06/24/2017  . Hyperglycemia 02/18/2017  . Hepatitis B immune 02/18/2017  . 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 S/P total abdominal hysterectomy 06/28/2015  . Abnormal uterine bleeding (AUB) 05/03/2015  . Condyloma acuminatum 02/23/2015  . Fibroids 01/05/2015  . Urinary incontinence 01/05/2015  . Abnormal finding on GI tract imaging 10/12/2013  . Anemia-chronic 10/09/2013  . Achalasia s/p laparoscopic Heller myotomy and Dor fundoplication 29/51/8841  . Ganglion cyst of finger of right hand 07/06/2013  . Trichomoniasis of vagina 06/03/2013  . GERD (gastroesophageal reflux disease) 03/16/2013  . Depression 02/10/2009  . Human immunodeficiency virus (HIV) disease ([place]) 03/24/2007  . Anxiety state 03/24/2007  . ASTHMA 03/24/2007  Past Medical History:  Diagnosis Date  . Anemia   . Anxiety   . Asthma   . Bulging lumbar disc   . Chronic back pain   . Depression   . Genital HSV   . GERD (gastroesophageal reflux disease)   . Hepatitis   . HIV infection ([place])   . Migraines   . Pneumonia     Family History  Problem Relation Age of Onset  . Hyperlipidemia Mother   . Diabetes Mother   . Stroke Mother   . Diabetes Brother   . Colon cancer Father   . Esophageal cancer Neg Hx   . Rectal cancer Neg Hx   . Stomach cancer Neg Hx      Past Surgical History:  Procedure Laterality Date  . ABDOMINAL HYSTERECTOMY N/A 06/28/2015   Procedure: HYSTERECTOMY ABDOMINAL;  Surgeon: Osborne Oman, MD;  Location: [facility];  Service: Gynecology;  Laterality: N/A;  Requested 06/28/15 @ 7:30a  . BILATERAL SALPINGECTOMY Bilateral 06/28/2015   Procedure: BILATERAL SALPINGECTOMY;  Surgeon: Osborne Oman, MD;  Location: [facility];  Service: Gynecology;  Laterality: Bilateral;  . CESAREAN SECTION     twice;  vertical incision x 2  . COLONOSCOPY N/A 10/12/2013   Procedure: COLONOSCOPY;  Surgeon: Jerene Bears, MD;  Location: [facility];  Service: Endoscopy;  Laterality: N/A;  . ESOPHAGEAL MANOMETRY N/A 06/29/2013   Procedure: ESOPHAGEAL MANOMETRY (EM);  Surgeon: Jerene Bears, MD;  Location: [facility];  Service: Gastroenterology;  Laterality: N/A;  . HELLER MYOTOMY N/A 10/08/2013   Procedure: LAPAROSCOPIC HELLER MYOTOMY, DOR FUNDIPLICATION, UPPER ENDOSCOPY;  Surgeon: Odis Hollingshead, MD;  Location: [facility];  Service: General;  Laterality: N/A;  . HERNIA REPAIR    . HYSTEROSCOPY WITH RESECTOSCOPE N/A 05/03/2015   Procedure: Diagnostic Hysteroscopy;  Surgeon: Lavonia Drafts, MD;  Location: [facility];  Service: Gynecology;  Laterality: N/A;  wants Novasure and myosure.  Marland Kitchen LYSIS OF ADHESION  06/28/2015   Procedure: LYSIS OF ADHESION;  Surgeon: Osborne Oman, MD;  Location: [facility];  Service: Gynecology;;  . OTHER SURGICAL HISTORY     biopsy axillary ,right arm  . right arm biopsy    . UPPER GI ENDOSCOPY N/A 10/08/2013   Procedure: UPPER GI ENDOSCOPY;  Surgeon: Odis Hollingshead, MD;  Location: [facility];  Service: General;  Laterality: N/A;  . WISDOM TOOTH EXTRACTION     Social History   Occupational History  . House wife    Social History Main Topics  . Smoking status: Never Smoker  . Smokeless tobacco: Never Used  . Alcohol use No  . Drug use: No  . Sexual activity: Not Currently    Partners: Male    Birth control/ protection:  Surgical     Comment: pt. given condoms

## 2017-07-18 ENCOUNTER — Encounter (INDEPENDENT_AMBULATORY_CARE_PROVIDER_SITE_OTHER): Payer: Self-pay | Admitting: Orthopedic Surgery

## 2017-07-18 ENCOUNTER — Ambulatory Visit (INDEPENDENT_AMBULATORY_CARE_PROVIDER_SITE_OTHER): Payer: Medicare Other | Admitting: Orthopedic Surgery

## 2017-07-18 DIAGNOSIS — S93411A Sprain of calcaneofibular ligament of right ankle, initial encounter: Secondary | ICD-10-CM | POA: Diagnosis not present

## 2017-07-18 NOTE — Progress Notes (Signed)
Office Visit Note   Patient: Danielle [name]           Date of Birth: 1971-03-14           MRN: [id number] Visit Date: 07/18/2017              Requested by: Campbell Riches, MD [address] [place], [place] [address] PCP: Campbell Riches, MD  Chief Complaint  Patient presents with  . Right Ankle - Pain      HPI: Patient presents in follow-up for sprain of the anterior talofibular ligament.  She has been taking Aleve 2 twice a day she states she still has point tenderness at this location she states she has no improvement with using the fracture boot.  Assessment & Plan: Visit Diagnoses:  1. Sprain of calcaneofibular ligament of right ankle, initial encounter     Plan: We will set her up with physical therapy for strengthening range of mobility and proprioception for the right ankle.    Follow-Up Instructions: Return in about 3 weeks (around 08/08/2017).   Ortho Exam  Patient is alert, oriented, no adenopathy, well-dressed, normal affect, normal respiratory effort. Examination patient has a good pulse she has good wrinkling of the skin there is no swelling.  She has good ankle and subtalar motion.  The peroneal and posterior tibial tendon are nontender to palpation.  She has no tenderness to palpation over the deltoid ligament anterior ankle joint is nontender to palpation.  She is point tender and exquisitely tender to palpation over the anterior talofibular ligament.  Anterior drawer is stable.  She is showing improvement in the stability of her ankle.  There is no redness no cellulitis no signs of infection no signs of an inflammatory process.  Imaging: No results found. No images are attached to the encounter.  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 04/27/2015    Orders:  No orders of the defined types were placed in this encounter.  No orders of the defined types were  placed in this encounter.    Procedures: No procedures performed  Clinical Data: No additional findings.  ROS:  All other systems negative, except as noted in the HPI. Review of Systems  Objective: Vital Signs: LMP  (LMP Unknown)   Specialty Comments:  No specialty comments available.  PMFS History: Patient Active Problem List   Diagnosis Date Noted  . Sprain of calcaneofibular ligament of right ankle 07/04/2017  . Ankle pain, right 06/24/2017  . Hyperglycemia 02/18/2017  . Hepatitis B immune 02/18/2017  . 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 S/P total abdominal hysterectomy 06/28/2015  . Abnormal uterine bleeding (AUB) 05/03/2015  . Condyloma acuminatum 02/23/2015  . Fibroids 01/05/2015  . Urinary incontinence 01/05/2015  . Abnormal finding on GI tract imaging 10/12/2013  . Anemia-chronic 10/09/2013  . Achalasia s/p laparoscopic Heller myotomy and Dor fundoplication 09/47/0962  . Ganglion cyst of finger of right hand 07/06/2013  . Trichomoniasis of vagina 06/03/2013  . GERD (gastroesophageal reflux disease) 03/16/2013  . Depression 02/10/2009  . Human immunodeficiency virus (HIV) disease ([place]) 03/24/2007  . Anxiety state 03/24/2007  . ASTHMA 03/24/2007   Past Medical History:  Diagnosis Date  . Anemia   . Anxiety   . Asthma   . Bulging lumbar disc   . Chronic back pain   . Depression   . Genital HSV   .  GERD (gastroesophageal reflux disease)   . Hepatitis   . HIV infection ([place])   . Migraines   . Pneumonia     Family History  Problem Relation Age of Onset  . Hyperlipidemia Mother   . Diabetes Mother   . Stroke Mother   . Diabetes Brother   . Colon cancer Father   . Esophageal cancer Neg Hx   . Rectal cancer Neg Hx   . Stomach cancer Neg Hx     Past Surgical History:  Procedure Laterality Date  . CESAREAN SECTION     twice;  vertical incision x 2  . HERNIA REPAIR    . OTHER SURGICAL HISTORY     biopsy axillary ,right arm  . right arm biopsy     . WISDOM TOOTH EXTRACTION     Social History   Occupational History  . Occupation: House wife  Tobacco Use  . Smoking status: Never Smoker  . Smokeless tobacco: Never Used  Substance and Sexual Activity  . Alcohol use: No  . Drug use: No  . Sexual activity: Not Currently    Partners: Male    Birth control/protection: Surgical    Comment: pt. given condoms

## 2017-07-22 ENCOUNTER — Telehealth: Payer: Self-pay | Admitting: Infectious Diseases

## 2017-07-22 NOTE — Telephone Encounter (Signed)
Called pt re: her application for student loan disability Ok to put HIV on application

## 2017-07-24 ENCOUNTER — Telehealth (INDEPENDENT_AMBULATORY_CARE_PROVIDER_SITE_OTHER): Payer: Self-pay | Admitting: Orthopedic Surgery

## 2017-07-24 DIAGNOSIS — G43909 Migraine, unspecified, not intractable, without status migrainosus: Secondary | ICD-10-CM | POA: Diagnosis not present

## 2017-07-24 DIAGNOSIS — M25571 Pain in right ankle and joints of right foot: Secondary | ICD-10-CM | POA: Diagnosis not present

## 2017-07-24 DIAGNOSIS — M549 Dorsalgia, unspecified: Secondary | ICD-10-CM | POA: Diagnosis not present

## 2017-07-24 NOTE — Telephone Encounter (Signed)
Danielle, [name] 24-Nov-1970        Please call pt [phone] Pt is waiting on a phone call from the physical therapist office.Pt is scheduled to see Dr.Duda on 08/08/17 @1 :15pm. Pt specified the physical therapist was for her foot.

## 2017-07-24 NOTE — Telephone Encounter (Signed)
Advised her to call [facility], referral was sent through Epic last week, advised maybe behind, this is a busy time of the year.

## 2017-07-29 ENCOUNTER — Encounter: Payer: Self-pay | Admitting: Physical Therapy

## 2017-07-29 ENCOUNTER — Ambulatory Visit: Payer: Medicare Other | Attending: Orthopedic Surgery | Admitting: Physical Therapy

## 2017-07-29 DIAGNOSIS — M25571 Pain in right ankle and joints of right foot: Secondary | ICD-10-CM

## 2017-07-29 DIAGNOSIS — M6281 Muscle weakness (generalized): Secondary | ICD-10-CM | POA: Diagnosis not present

## 2017-07-29 DIAGNOSIS — R262 Difficulty in walking, not elsewhere classified: Secondary | ICD-10-CM | POA: Insufficient documentation

## 2017-07-29 NOTE — Therapy (Signed)
[place], [place], [address] Phone: [phone]   Fax:  [phone]  Physical Therapy Evaluation  Patient Details  Name: Danielle [name] MRN: [id number] Date of Birth: 26-Aug-1971 Referring Provider: Dr. Sharol Given   Encounter Date: 07/29/2017  PT End of Session - 07/29/17 1325    Visit Number  1    Number of Visits  12    Date for PT Re-Evaluation  09/13/17    PT Start Time  1320    PT Stop Time  1410    PT Time Calculation (min)  50 min    Activity Tolerance  Patient tolerated treatment well    Behavior During Therapy  [facility] for tasks assessed/performed       Past Medical History:  Diagnosis Date  . Anemia   . Anxiety   . Asthma   . Bulging lumbar disc   . Chronic back pain   . Depression   . Genital HSV   . GERD (gastroesophageal reflux disease)   . Hepatitis   . HIV infection ([place])   . Migraines   . Pneumonia     Past Surgical History:  Procedure Laterality Date  . BILATERAL SALPINGECTOMY Bilateral 06/28/2015   Performed by Osborne Oman, MD at [facility] ORS  . CESAREAN SECTION     twice;  vertical incision x 2  . COLONOSCOPY N/A 10/12/2013   Performed by Jerene Bears, MD at [facility]  . Diagnostic Hysteroscopy N/A 05/03/2015   Performed by Lavonia Drafts, MD at [facility] (EM) N/A 06/29/2013   Performed by Jerene Bears, MD at [facility]  . HERNIA REPAIR    . HYSTERECTOMY ABDOMINAL N/A 06/28/2015   Performed by Osborne Oman, MD at [facility] ORS  . LAPAROSCOPIC HELLER MYOTOMY, DOR FUNDIPLICATION, UPPER ENDOSCOPY N/A 10/08/2013   Performed by Jackolyn Confer, MD at [facility] ORS  . LYSIS OF ADHESION  06/28/2015   Performed by Osborne Oman, MD at [facility] ORS  . OTHER SURGICAL HISTORY     biopsy axillary ,right arm  . right arm biopsy    . UPPER GI ENDOSCOPY N/A 10/08/2013   Performed by Jackolyn Confer, MD at [facility] ORS  . WISDOM TOOTH EXTRACTION      There were  no vitals filed for this visit.   Subjective Assessment - 07/29/17 1319    Subjective  Patient presents for eval of Rt. ankle sprain, she was running in September. Went to ED 1 week after. She received an ankle brace and a crutch which both didnt help (received in ED, no instruction)  She has used a boot for 2 weeks but she did not wear it often.   Pain cont. to limit her walking, stairs, driving.  She now has pain more .      Pertinent History  chronic back pain , borderline DM, HIV     Limitations  Standing;Walking;Sitting;House hold activities;Other (comment) malpositioning , sleep    How long can you sit comfortably?  OK if she positions foot right     How long can you stand comfortably?  10-15 min     How long can you walk comfortably?  not comfortable    Diagnostic tests  XR neg, MRI neg     Patient Stated Goals  Pt would like to walk without pain, do stairs , less pain.     Currently in Pain?  Yes    Pain Score  [place], [place], [address] Phone: [phone]   Fax:  [phone]  Physical Therapy Evaluation  Patient Details  Name: Danielle [name] MRN: [id number] Date of Birth: 26-Aug-1971 Referring Provider: Dr. Sharol Given   Encounter Date: 07/29/2017  PT End of Session - 07/29/17 1325    Visit Number  1    Number of Visits  12    Date for PT Re-Evaluation  09/13/17    PT Start Time  1320    PT Stop Time  1410    PT Time Calculation (min)  50 min    Activity Tolerance  Patient tolerated treatment well    Behavior During Therapy  [facility] for tasks assessed/performed       Past Medical History:  Diagnosis Date  . Anemia   . Anxiety   . Asthma   . Bulging lumbar disc   . Chronic back pain   . Depression   . Genital HSV   . GERD (gastroesophageal reflux disease)   . Hepatitis   . HIV infection ([place])   . Migraines   . Pneumonia     Past Surgical History:  Procedure Laterality Date  . BILATERAL SALPINGECTOMY Bilateral 06/28/2015   Performed by Osborne Oman, MD at [facility]S  . CESAREAN SECTION     twice;  vertical incision x 2  . COLONOSCOPY N/A 10/12/2013   Performed by Jerene Bears, MD at [facility]  . Diagnostic Hysteroscopy N/A 05/03/2015   Performed by Lavonia Drafts, MD at [facility] (EM) N/A 06/29/2013   Performed by Jerene Bears, MD at [facility]  . HERNIA REPAIR    . HYSTERECTOMY ABDOMINAL N/A 06/28/2015   Performed by Osborne Oman, MD at [facility] ORS  . LAPAROSCOPIC HELLER MYOTOMY, DOR FUNDIPLICATION, UPPER ENDOSCOPY N/A 10/08/2013   Performed by Jackolyn Confer, MD at [facility] ORS  . LYSIS OF ADHESION  06/28/2015   Performed by Osborne Oman, MD at [facility] ORS  . OTHER SURGICAL HISTORY     biopsy axillary ,right arm  . right arm biopsy    . UPPER GI ENDOSCOPY N/A 10/08/2013   Performed by Jackolyn Confer, MD at [facility] ORS  . WISDOM TOOTH EXTRACTION      There were  no vitals filed for this visit.   Subjective Assessment - 07/29/17 1319    Subjective  Patient presents for eval of Rt. ankle sprain, she was running in September. Went to ED 1 week after. She received an ankle brace and a crutch which both didnt help (received in ED, no instruction)  She has used a boot for 2 weeks but she did not wear it often.   Pain cont. to limit her walking, stairs, driving.  She now has pain more .      Pertinent History  chronic back pain , borderline DM, HIV     Limitations  Standing;Walking;Sitting;House hold activities;Other (comment) malpositioning , sleep    How long can you sit comfortably?  OK if she positions foot right     How long can you stand comfortably?  10-15 min     How long can you walk comfortably?  not comfortable    Diagnostic tests  XR neg, MRI neg     Patient Stated Goals  Pt would like to walk without pain, do stairs , less pain.     Currently in Pain?  Yes    Pain Score  [place], [place], [address] Phone: [phone]   Fax:  [phone]  Physical Therapy Evaluation  Patient Details  Name: Danielle [name] MRN: [id number] Date of Birth: 26-Aug-1971 Referring Provider: Dr. Sharol Given   Encounter Date: 07/29/2017  PT End of Session - 07/29/17 1325    Visit Number  1    Number of Visits  12    Date for PT Re-Evaluation  09/13/17    PT Start Time  1320    PT Stop Time  1410    PT Time Calculation (min)  50 min    Activity Tolerance  Patient tolerated treatment well    Behavior During Therapy  [facility] for tasks assessed/performed       Past Medical History:  Diagnosis Date  . Anemia   . Anxiety   . Asthma   . Bulging lumbar disc   . Chronic back pain   . Depression   . Genital HSV   . GERD (gastroesophageal reflux disease)   . Hepatitis   . HIV infection ([place])   . Migraines   . Pneumonia     Past Surgical History:  Procedure Laterality Date  . BILATERAL SALPINGECTOMY Bilateral 06/28/2015   Performed by Osborne Oman, MD at [facility]  . CESAREAN SECTION     twice;  vertical incision x 2  . COLONOSCOPY N/A 10/12/2013   Performed by Jerene Bears, MD at [facility]  . Diagnostic Hysteroscopy N/A 05/03/2015   Performed by Lavonia Drafts, MD at [facility] (EM) N/A 06/29/2013   Performed by Jerene Bears, MD at [facility]  . HERNIA REPAIR    . HYSTERECTOMY ABDOMINAL N/A 06/28/2015   Performed by Osborne Oman, MD at [facility] ORS  . LAPAROSCOPIC HELLER MYOTOMY, DOR FUNDIPLICATION, UPPER ENDOSCOPY N/A 10/08/2013   Performed by Jackolyn Confer, MD at [facility] ORS  . LYSIS OF ADHESION  06/28/2015   Performed by Osborne Oman, MD at [facility] ORS  . OTHER SURGICAL HISTORY     biopsy axillary ,right arm  . right arm biopsy    . UPPER GI ENDOSCOPY N/A 10/08/2013   Performed by Jackolyn Confer, MD at [facility] ORS  . WISDOM TOOTH EXTRACTION      There were  no vitals filed for this visit.   Subjective Assessment - 07/29/17 1319    Subjective  Patient presents for eval of Rt. ankle sprain, she was running in September. Went to ED 1 week after. She received an ankle brace and a crutch which both didnt help (received in ED, no instruction)  She has used a boot for 2 weeks but she did not wear it often.   Pain cont. to limit her walking, stairs, driving.  She now has pain more .      Pertinent History  chronic back pain , borderline DM, HIV     Limitations  Standing;Walking;Sitting;House hold activities;Other (comment) malpositioning , sleep    How long can you sit comfortably?  OK if she positions foot right     How long can you stand comfortably?  10-15 min     How long can you walk comfortably?  not comfortable    Diagnostic tests  XR neg, MRI neg     Patient Stated Goals  Pt would like to walk without pain, do stairs , less pain.     Currently in Pain?  Yes    Pain Score  PT Frequency  2x / week    PT Duration  6 weeks     PT Treatment/Interventions  ADLs/Self Care Home Management;Iontophoresis 4mg /ml Dexamethasone;Gait training;Stair training;Neuromuscular re-education;Passive range of motion;Manual techniques;Functional mobility training;Moist Heat;Cryotherapy;Electrical Stimulation;Contrast Bath;Balance training;Therapeutic exercise;Therapeutic activities;Ultrasound;DME Instruction;Patient/family education;Taping    PT Next Visit Plan  progress AROM, begin light bands, BAPS, seated PF, test MMT if pain allows. consider tape , gait with cane    PT Home Exercise Plan  AROM ankle     Consulted and Agree with Plan of Care  Patient       Patient will benefit from skilled therapeutic intervention in order to improve the following deficits and impairments:  Abnormal gait, Increased fascial restricitons, Impaired sensation, Pain  Visit Diagnosis: Pain in right ankle and joints of right foot  Difficulty in walking, not elsewhere classified  G-Codes - Jul 31, 2017 1403    Functional Assessment Tool Used (Outpatient Only)  FOTO    Functional Limitation  Mobility: Walking and moving around    Mobility: Walking and Moving Around Current Status (E7209)  At least 60 percent but less than 80 percent impaired, limited or restricted    Mobility: Walking and Moving Around Goal Status [phone])  At least 20 percent but less than 40 percent impaired, limited or restricted        Problem List Patient Active Problem List   Diagnosis Date Noted  . Sprain of calcaneofibular ligament of right ankle 07/04/2017  . Ankle pain, right 06/24/2017  . Hyperglycemia 02/18/2017  . Hepatitis B immune 02/18/2017  . 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 S/P total abdominal hysterectomy 06/28/2015  . Abnormal uterine bleeding (AUB) 05/03/2015  . Condyloma acuminatum 02/23/2015  . Fibroids 01/05/2015  . Urinary incontinence 01/05/2015  . Abnormal finding on GI tract imaging 10/12/2013  . Anemia-chronic 10/09/2013  . Achalasia s/p  laparoscopic Heller myotomy and Dor fundoplication 28/36/6294  . Ganglion cyst of finger of right hand 07/06/2013  . Trichomoniasis of vagina 06/03/2013  . GERD (gastroesophageal reflux disease) 03/16/2013  . Depression 02/10/2009  . Human immunodeficiency virus (HIV) disease ([place]) 03/24/2007  . Anxiety state 03/24/2007  . ASTHMA 03/24/2007    Danielle [name] Jul 31, 2017, 3:23 PM  [facility] [address] [place], [place], [address] Phone: [phone]   Fax:  [phone]  Name: Danielle [name] MRN: [id number] Date of Birth: 1971-06-20   Raeford Razor, PT 31-Jul-2017 3:24 PM Phone: [phone] Fax: [phone]

## 2017-07-29 NOTE — Patient Instructions (Signed)
ROM: Plantar / Dorsiflexion    With left leg relaxed, gently flex and extend ankle. Move through full range of motion. Avoid pain. Repeat __20__ times per set. Do ___1_ sets per session. Do ___2_ sessions per day.  http://orth.exer.us/35   Copyright  VHI. All rights reserved.  ROM: Inversion / Eversion    With left leg relaxed, gently turn ankle and foot in and out. Move through full range of motion. Avoid pain. Repeat __20__ times per set. Do __1__ sets per session. Do __2__ sessions per day.  http://orth.exer.us/37   Copyright  VHI. All rights reserved.    Ankle Circles    Slowly rotate right foot and ankle clockwise then counterclockwise. Gradually increase range of motion. Avoid pain. Circle ___20_ times each direction per set. Do __2__ sets per session. Do _2___ sessions per day.  http://orth.exer.us/31   Copyright  VHI. All rights reserved.

## 2017-07-30 ENCOUNTER — Ambulatory Visit: Payer: Medicare Other | Admitting: Physical Therapy

## 2017-07-30 ENCOUNTER — Encounter: Payer: Self-pay | Admitting: Physical Therapy

## 2017-07-30 DIAGNOSIS — M6281 Muscle weakness (generalized): Secondary | ICD-10-CM | POA: Diagnosis not present

## 2017-07-30 DIAGNOSIS — R262 Difficulty in walking, not elsewhere classified: Secondary | ICD-10-CM | POA: Diagnosis not present

## 2017-07-30 DIAGNOSIS — M25571 Pain in right ankle and joints of right foot: Secondary | ICD-10-CM

## 2017-07-30 DIAGNOSIS — F411 Generalized anxiety disorder: Secondary | ICD-10-CM | POA: Diagnosis not present

## 2017-07-30 DIAGNOSIS — F319 Bipolar disorder, unspecified: Secondary | ICD-10-CM | POA: Diagnosis not present

## 2017-07-30 NOTE — Therapy (Signed)
[place], [place], [address] Phone: [phone]   Fax:  [phone]  Physical Therapy Treatment  Patient Details  Name: Danielle [name] MRN: [id number] Date of Birth: 06/01/1971 Referring Provider: Dr. Sharol Given   Encounter Date: 07/30/2017  PT End of Session - 07/30/17 1200    Visit Number  2    Number of Visits  12    Date for PT Re-Evaluation  09/13/17    PT Start Time  1148    PT Stop Time  1230    PT Time Calculation (min)  42 min    Activity Tolerance  Patient tolerated treatment well    Behavior During Therapy  [facility] for tasks assessed/performed       Past Medical History:  Diagnosis Date  . Anemia   . Anxiety   . Asthma   . Bulging lumbar disc   . Chronic back pain   . Depression   . Genital HSV   . GERD (gastroesophageal reflux disease)   . Hepatitis   . HIV infection ([place])   . Migraines   . Pneumonia     Past Surgical History:  Procedure Laterality Date  . ABDOMINAL HYSTERECTOMY N/A 06/28/2015   Procedure: HYSTERECTOMY ABDOMINAL;  Surgeon: Osborne Oman, MD;  Location: [facility];  Service: Gynecology;  Laterality: N/A;  Requested 06/28/15 @ 7:30a  . BILATERAL SALPINGECTOMY Bilateral 06/28/2015   Procedure: BILATERAL SALPINGECTOMY;  Surgeon: Osborne Oman, MD;  Location: [facility];  Service: Gynecology;  Laterality: Bilateral;  . CESAREAN SECTION     twice;  vertical incision x 2  . COLONOSCOPY N/A 10/12/2013   Procedure: COLONOSCOPY;  Surgeon: Jerene Bears, MD;  Location: [facility];  Service: Endoscopy;  Laterality: N/A;  . ESOPHAGEAL MANOMETRY N/A 06/29/2013   Procedure: ESOPHAGEAL MANOMETRY (EM);  Surgeon: Jerene Bears, MD;  Location: [facility];  Service: Gastroenterology;  Laterality: N/A;  . HELLER MYOTOMY N/A 10/08/2013   Procedure: LAPAROSCOPIC HELLER MYOTOMY, DOR FUNDIPLICATION, UPPER ENDOSCOPY;  Surgeon: Odis Hollingshead, MD;  Location: [facility];  Service: General;   Laterality: N/A;  . HERNIA REPAIR    . HYSTEROSCOPY WITH RESECTOSCOPE N/A 05/03/2015   Procedure: Diagnostic Hysteroscopy;  Surgeon: Lavonia Drafts, MD;  Location: [facility];  Service: Gynecology;  Laterality: N/A;  wants Novasure and myosure.  Marland Kitchen LYSIS OF ADHESION  06/28/2015   Procedure: LYSIS OF ADHESION;  Surgeon: Osborne Oman, MD;  Location: [facility];  Service: Gynecology;;  . OTHER SURGICAL HISTORY     biopsy axillary ,right arm  . right arm biopsy    . UPPER GI ENDOSCOPY N/A 10/08/2013   Procedure: UPPER GI ENDOSCOPY;  Surgeon: Odis Hollingshead, MD;  Location: [facility];  Service: General;  Laterality: N/A;  . WISDOM TOOTH EXTRACTION      There were no vitals filed for this visit.  Subjective Assessment - 07/30/17 1153    Subjective  Pain last night was severe.  Just took some Aleve . Brings in cane     Currently in Pain?  Yes    Pain Score  7     Pain Orientation  Right       [facility] Adult PT Treatment/Exercise - 07/30/17 0001      Ambulation/Gait   Ambulation Distance (Feet)  300 Feet    Assistive device  Straight cane    Gait Pattern  Antalgic    Stairs  Yes    Stairs Assistance  5:  Supervision    Stairs Assistance Details (indicate cue type and reason)  verbal for cane sequencing     Stair Management Technique  One rail Left;Step to pattern    Number of Stairs  12    Height of Stairs  6      Modalities   Modalities  Ultrasound      Ultrasound   Ultrasound Location  Rt. ant lateral ankle (talofib lig)     Ultrasound Parameters  1 W/cm2 and 1.0 MHz    Ultrasound Goals  Pain      Ankle Exercises: Supine   T-Band  yellow x 20 INV, EV, DF and PF     Other Supine Ankle Exercises  supine DF/PF, INV/EV and circles x 20              PT Education - 07/30/17 1200    Education provided  Yes    Education Details  strengthening     Person(s) Educated  Patient    Methods  Explanation;Handout    Comprehension  Verbalized understanding          PT Long  Term Goals - 07/29/17 1513      PT LONG TERM GOAL #1   Title  Pt will be I with HEP for ankle ROM, proprioception and strength     Time  6    Period  Weeks    Status  New    Target Date  09/13/17      PT LONG TERM GOAL #2   Title  Pt will be able to walk without AD and min limp for 1000 feet for community mobility.     Time  6    Period  Weeks    Status  New    Target Date  09/13/17      PT LONG TERM GOAL #3   Title  Pt will stand on Rt. LE for up to 30 sec for min dynamic activity and pain <4/10     Time  6    Period  Weeks    Status  New    Target Date  09/13/17      PT LONG TERM GOAL #4   Title  Pt will negotiate stairs reciprocally, 1 rail with min increase in pain from baseline.     Time  6    Period  Weeks    Status  New    Target Date  09/13/17      PT LONG TERM GOAL #5   Title  Pt will demo normal, pain free AROM of ankle     Time  6    Period  Weeks    Status  New    Target Date  09/13/17            Plan - 07/30/17 1242    Clinical Impression Statement  pt did well today, AROM improved from yesterday. Pain increased post ROM and gait .  Gave light band for 4 way ankle.     PT Next Visit Plan  progress AROM, begin light bands, BAPS, seated PF, test MMT if pain allows. consider tape , gait with cane    PT Home Exercise Plan  AROM ankle , yellow band 4 way        Patient will benefit from skilled therapeutic intervention in order to improve the following deficits and impairments:     Visit Diagnosis: Pain in right ankle and joints of right foot  Difficulty in walking, not  elsewhere classified  Muscle weakness (generalized)   G-Codes - August 11, 2017 1403    Functional Assessment Tool Used (Outpatient Only)  FOTO    Functional Limitation  Mobility: Walking and moving around    Mobility: Walking and Moving Around Current Status [phone])  At least 60 percent but less than 80 percent impaired, limited or restricted    Mobility: Walking and Moving Around  Goal Status [phone])  At least 20 percent but less than 40 percent impaired, limited or restricted       Problem List Patient Active Problem List   Diagnosis Date Noted  . Sprain of calcaneofibular ligament of right ankle 07/04/2017  . Ankle pain, right 06/24/2017  . Hyperglycemia 02/18/2017  . Hepatitis B immune 02/18/2017  . 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 S/P total abdominal hysterectomy 06/28/2015  . Abnormal uterine bleeding (AUB) 05/03/2015  . Condyloma acuminatum 02/23/2015  . Fibroids 01/05/2015  . Urinary incontinence 01/05/2015  . Abnormal finding on GI tract imaging 10/12/2013  . Anemia-chronic 10/09/2013  . Achalasia s/p laparoscopic Heller myotomy and Dor fundoplication 64/15/8309  . Ganglion cyst of finger of right hand 07/06/2013  . Trichomoniasis of vagina 06/03/2013  . GERD (gastroesophageal reflux disease) 03/16/2013  . Depression 02/10/2009  . Human immunodeficiency virus (HIV) disease ([place]) 03/24/2007  . Anxiety state 03/24/2007  . ASTHMA 03/24/2007    PAA,JENNIFER 07/30/2017, 12:44 PM  [facility] [address] [place], [place], [address] Phone: [phone]   Fax:  [phone]  Name: PANDORA MCCRACKIN MRN: [id number] Date of Birth: Dec 12, 1970  Raeford Razor, PT 07/30/17 12:44 PM Phone: [phone] Fax: [phone]

## 2017-08-05 ENCOUNTER — Encounter: Payer: Self-pay | Admitting: Physical Therapy

## 2017-08-05 ENCOUNTER — Ambulatory Visit: Payer: Medicare Other | Admitting: Physical Therapy

## 2017-08-05 DIAGNOSIS — M25571 Pain in right ankle and joints of right foot: Secondary | ICD-10-CM | POA: Diagnosis not present

## 2017-08-05 DIAGNOSIS — R262 Difficulty in walking, not elsewhere classified: Secondary | ICD-10-CM

## 2017-08-05 DIAGNOSIS — M6281 Muscle weakness (generalized): Secondary | ICD-10-CM | POA: Diagnosis not present

## 2017-08-05 NOTE — Therapy (Addendum)
[place], [place], [address] Phone: [phone]   Fax:  [phone]  Physical Therapy Treatment and Discharge  Patient Details  Name: Danielle [name] MRN: [id number] Date of Birth: 09/03/1971 Referring Provider: Dr. Sharol Given   Encounter Date: 08/05/2017  PT End of Session - 08/05/17 1024    Visit Number  3    Number of Visits  12    Date for PT Re-Evaluation  09/13/17    PT Start Time  0945 15 minutes late     PT Stop Time  1017    PT Time Calculation (min)  32 min       Past Medical History:  Diagnosis Date  . Anemia   . Anxiety   . Asthma   . Bulging lumbar disc   . Chronic back pain   . Depression   . Genital HSV   . GERD (gastroesophageal reflux disease)   . Hepatitis   . HIV infection ([place])   . Migraines   . Pneumonia     Past Surgical History:  Procedure Laterality Date  . ABDOMINAL HYSTERECTOMY N/A 06/28/2015   Procedure: HYSTERECTOMY ABDOMINAL;  Surgeon: Osborne Oman, MD;  Location: [facility];  Service: Gynecology;  Laterality: N/A;  Requested 06/28/15 @ 7:30a  . BILATERAL SALPINGECTOMY Bilateral 06/28/2015   Procedure: BILATERAL SALPINGECTOMY;  Surgeon: Osborne Oman, MD;  Location: [facility];  Service: Gynecology;  Laterality: Bilateral;  . CESAREAN SECTION     twice;  vertical incision x 2  . COLONOSCOPY N/A 10/12/2013   Procedure: COLONOSCOPY;  Surgeon: Jerene Bears, MD;  [facility];  Service: Endoscopy;  Laterality: N/A;  . ESOPHAGEAL MANOMETRY N/A 06/29/2013   Procedure: ESOPHAGEAL MANOMETRY (EM);  Surgeon: Jerene Bears, MD;  [facility];  Service: Gastroenterology;  Laterality: N/A;  . HELLER MYOTOMY N/A 10/08/2013   Procedure: LAPAROSCOPIC HELLER MYOTOMY, DOR FUNDIPLICATION, UPPER ENDOSCOPY;  Surgeon: Odis Hollingshead, MD;  Location: [facility];  Service: General;  Laterality: N/A;  . HERNIA REPAIR    . HYSTEROSCOPY WITH RESECTOSCOPE N/A 05/03/2015    Procedure: Diagnostic Hysteroscopy;  Surgeon: Lavonia Drafts, MD;  Location: [facility];  Service: Gynecology;  Laterality: N/A;  wants Novasure and myosure.  Marland Kitchen LYSIS OF ADHESION  06/28/2015   Procedure: LYSIS OF ADHESION;  Surgeon: Osborne Oman, MD;  Location: [facility];  Service: Gynecology;;  . OTHER SURGICAL HISTORY     biopsy axillary ,right arm  . right arm biopsy    . UPPER GI ENDOSCOPY N/A 10/08/2013   Procedure: UPPER GI ENDOSCOPY;  Surgeon: Odis Hollingshead, MD;  [facility];  Service: General;  Laterality: N/A;  . WISDOM TOOTH EXTRACTION      There were no vitals filed for this visit.      [facility] PT Assessment - 08/05/17 0001      Strength   Right/Left Ankle  Right    Right Ankle Dorsiflexion  4/5 min pain    Right Ankle Plantar Flexion  4+/5 manual test    Right Ankle Inversion  4/5 min pain    Right Ankle Eversion  4/5 min pain        [facility] Adult PT Treatment/Exercise - 08/05/17 0001      Ultrasound   Ultrasound Location  Rt ant lateral ankle     Ultrasound Parameters  50% 1.0 w/cm2 3.16mz x 8 minutes     Ultrasound Goals  Pain  Ankle Exercises: Supine   T-Band  yellow x 20 INV, EV, DF and PF     Other Supine Ankle Exercises  seated  DF/PF, INV/EV and circles x 20       Ankle Exercises: Stretches   Gastroc Stretch  2 reps;30 seconds towel, seated                   PT Long Term Goals - 07/29/17 1513      PT LONG TERM GOAL #1   Title  Pt will be I with HEP for ankle ROM, proprioception and strength     Time  6    Period  Weeks    Status  New    Target Date  09/13/17      PT LONG TERM GOAL #2   Title  Pt will be able to walk without AD and min limp for 1000 feet for community mobility.     Time  6    Period  Weeks    Status  New    Target Date  09/13/17      PT LONG TERM GOAL #3   Title  Pt will stand on Rt. LE for up to 30 sec for min dynamic activity and pain <4/10     Time  6    Period  Weeks    Status  New    Target  Date  09/13/17      PT LONG TERM GOAL #4   Title  Pt will negotiate stairs reciprocally, 1 rail with min increase in pain from baseline.     Time  6    Period  Weeks    Status  New    Target Date  09/13/17      PT LONG TERM GOAL #5   Title  Pt will demo normal, pain free AROM of ankle     Time  6    Period  Weeks    Status  New    Target Date  09/13/17            Plan - 08/05/17 0954    Clinical Impression Statement  Pt arrives 15 minutes late due to over sleeping this morning. She arrives without cane or brace with significant limp. She reports compliance with HEP however has increased pain. Reviewed yellow band exercises and ankle AROM. Repeated ultrasound.. Pt reports pain decreased at end of treatment, Began education on gait with cues for heel strike and step length and was able to walk out of clinic without limp.     PT Next Visit Plan  progress AROM, begin light bands, BAPS, seated PF, . consider tape , contine gait with/without  cane    PT Home Exercise Plan  AROM ankle , yellow band 4 way     Consulted and Agree with Plan of Care  Patient       Patient will benefit from skilled therapeutic intervention in order to improve the following deficits and impairments:  Abnormal gait, Increased fascial restricitons, Impaired sensation, Pain  Visit Diagnosis: Pain in right ankle and joints of right foot  Difficulty in walking, not elsewhere classified  Muscle weakness (generalized)     Problem List Patient Active Problem List   Diagnosis Date Noted  . Sprain of calcaneofibular ligament of right ankle 07/04/2017  . Ankle pain, right 06/24/2017  . Hyperglycemia 02/18/2017  . Hepatitis B immune 02/18/2017  . 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 S/P total abdominal hysterectomy 06/28/2015  .  Abnormal uterine bleeding (AUB) 05/03/2015  . Condyloma acuminatum 02/23/2015  . Fibroids 01/05/2015  . Urinary incontinence 01/05/2015  . Abnormal finding on GI tract imaging  10/12/2013  . Anemia-chronic 10/09/2013  . Achalasia s/p laparoscopic Heller myotomy and Dor fundoplication 07/68/0881  . Ganglion cyst of finger of right hand 07/06/2013  . Trichomoniasis of vagina 06/03/2013  . GERD (gastroesophageal reflux disease) 03/16/2013  . Depression 02/10/2009  . Human immunodeficiency virus (HIV) disease ([place]) 03/24/2007  . Anxiety state 03/24/2007  . ASTHMA 03/24/2007    Dorene Ar , PTA 08/05/2017, 10:28 AM  [facility] [address] [place], [place], [address] Phone: [phone]   Fax:  [phone]  Name: ILIYANA CONVEY MRN: [id number] Date of Birth: [birth year]   PHYSICAL THERAPY DISCHARGE SUMMARY  Visits from Start of Care:3  Current functional level related to goals / functional outcomes: Unknown, see above    Remaining deficits: See above   Education / Equipment: HEP, gait  Plan: Patient agrees to discharge.  Patient goals were not met. Patient is being discharged due to not returning since the last visit.  ?????   Patient having surgery.    Raeford Razor, PT 10/01/17 9:21 AM Phone: [phone] Fax: [phone]

## 2017-08-06 ENCOUNTER — Encounter: Payer: Self-pay | Admitting: *Deleted

## 2017-08-07 ENCOUNTER — Ambulatory Visit: Payer: Medicare Other | Admitting: Infectious Diseases

## 2017-08-07 ENCOUNTER — Ambulatory Visit: Payer: Medicare Other | Admitting: Physical Therapy

## 2017-08-08 ENCOUNTER — Ambulatory Visit (INDEPENDENT_AMBULATORY_CARE_PROVIDER_SITE_OTHER): Payer: Medicare Other | Admitting: Orthopedic Surgery

## 2017-08-08 ENCOUNTER — Encounter (INDEPENDENT_AMBULATORY_CARE_PROVIDER_SITE_OTHER): Payer: Self-pay | Admitting: Orthopedic Surgery

## 2017-08-08 VITALS — Ht 61.0 in | Wt 161.0 lb

## 2017-08-08 DIAGNOSIS — M25871 Other specified joint disorders, right ankle and foot: Secondary | ICD-10-CM

## 2017-08-08 DIAGNOSIS — S93411A Sprain of calcaneofibular ligament of right ankle, initial encounter: Secondary | ICD-10-CM

## 2017-08-08 DIAGNOSIS — M25571 Pain in right ankle and joints of right foot: Secondary | ICD-10-CM | POA: Diagnosis not present

## 2017-08-08 MED ORDER — LIDOCAINE HCL 1 % IJ SOLN
2.0000 mL | INTRAMUSCULAR | Status: AC | PRN
  Administered 2017-08-08: 2 mL

## 2017-08-08 MED ORDER — METHYLPREDNISOLONE ACETATE 40 MG/ML IJ SUSP
40.0000 mg | INTRAMUSCULAR | Status: AC | PRN
  Administered 2017-08-08: 40 mg via INTRA_ARTICULAR

## 2017-08-08 NOTE — Progress Notes (Signed)
Office Visit Note   Patient: Danielle [name]           Date of Birth: June 10, 1971           MRN: [id number] Visit Date: 08/08/2017              Requested by: Campbell Riches, MD [place] [place], [place] [address] PCP: Campbell Riches, MD  Chief Complaint  Patient presents with  . Right Ankle - Follow-up    Calcaneofibular ligament sprain       HPI: Patient is a 46 year old woman who is status post ankle sprain involving the medial and lateral ligaments.  Patient states that her most pain is directly over the anterior medial joint line.  Patient has increased pain with physical therapy manipulation.  Assessment & Plan: Visit Diagnoses:  1. Impingement of right ankle joint   2. Sprain of calcaneofibular ligament of right ankle, initial encounter     Plan: Recommended she discontinue physical therapy her ankle ligaments are stable her symptoms are for impingement.  The joint was injected patient tolerated this well plan follow-up in 3 weeks for reevaluation.  Patient may require an additional injection possibility for arthroscopic debridement.  Follow-Up Instructions: Return in about 3 weeks (around 08/29/2017).   Ortho Exam  Patient is alert, oriented, no adenopathy, well-dressed, normal affect, normal respiratory effort. Lamination patient has a good pulse she has good range of motion the ankle and subtalar joint anterior drawer is stable no ligamentous laxity she has minimal tenderness to palpation of the deltoid or lateral ankle ligaments.  She is massively tender to palpation over the anterior joint line consistent with impingement there are no dystrophic changes no redness no signs of gout there is no effusion  Imaging: No results found. No images are attached to the encounter.  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 04/27/2015     @LABSALLVALUES (HGBA1)@  @BMI1 @  Orders:  No orders of the defined types were placed in this encounter.  No orders of the defined types were placed in this encounter.    Procedures: Medium Joint Inj: R ankle on 08/08/2017 2:04 PM Indications: pain and diagnostic evaluation Details: 22 G 1.5 in needle, anteromedial approach Medications: 2 mL lidocaine 1 %; 40 mg methylPREDNISolone acetate 40 MG/ML Outcome: tolerated well, no immediate complications Procedure, treatment alternatives, risks and benefits explained, specific risks discussed. Consent was given by the patient. Immediately prior to procedure a time out was called to verify the correct patient, procedure, equipment, support staff and site/side marked as required. Patient was prepped and draped in the usual sterile fashion.      Clinical Data: No additional findings.  ROS:  All other systems negative, except as noted in the HPI. Review of Systems  Objective: Vital Signs: Ht 5\' 1"  (1.549 m)   Wt 161 lb (73 kg)   LMP  (LMP Unknown)   BMI 30.42 kg/m   Specialty Comments:  No specialty comments available.  PMFS History: Patient Active Problem List   Diagnosis Date Noted  . Sprain of calcaneofibular ligament of right ankle 07/04/2017  . Ankle pain, right 06/24/2017  . Hyperglycemia 02/18/2017  . Hepatitis B immune 02/18/2017  . 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 S/P total abdominal hysterectomy 06/28/2015  . Abnormal uterine bleeding (AUB) 05/03/2015  . Condyloma acuminatum 02/23/2015  . Fibroids 01/05/2015  . Urinary incontinence  01/05/2015  . Abnormal finding on GI tract imaging 10/12/2013  . Anemia-chronic 10/09/2013  . Achalasia s/p laparoscopic Heller myotomy and Dor fundoplication 93/71/6967  . Ganglion cyst of finger of right hand 07/06/2013  . Trichomoniasis of vagina 06/03/2013  . GERD (gastroesophageal reflux disease) 03/16/2013  . Depression 02/10/2009  . Human immunodeficiency virus  (HIV) disease ([place]) 03/24/2007  . Anxiety state 03/24/2007  . ASTHMA 03/24/2007   Past Medical History:  Diagnosis Date  . Anemia   . Anxiety   . Asthma   . Bulging lumbar disc   . Chronic back pain   . Depression   . Genital HSV   . GERD (gastroesophageal reflux disease)   . Hepatitis   . HIV infection ([place])   . Migraines   . Pneumonia     Family History  Problem Relation Age of Onset  . Hyperlipidemia Mother   . Diabetes Mother   . Stroke Mother   . Diabetes Brother   . Colon cancer Father   . Esophageal cancer Neg Hx   . Rectal cancer Neg Hx   . Stomach cancer Neg Hx     Past Surgical History:  Procedure Laterality Date  . ABDOMINAL HYSTERECTOMY N/A 06/28/2015   Procedure: HYSTERECTOMY ABDOMINAL;  Surgeon: Osborne Oman, MD;  Location: [facility];  Service: Gynecology;  Laterality: N/A;  Requested 06/28/15 @ 7:30a  . BILATERAL SALPINGECTOMY Bilateral 06/28/2015   Procedure: BILATERAL SALPINGECTOMY;  Surgeon: Osborne Oman, MD;  Location: [facility];  Service: Gynecology;  Laterality: Bilateral;  . CESAREAN SECTION     twice;  vertical incision x 2  . COLONOSCOPY N/A 10/12/2013   Procedure: COLONOSCOPY;  Surgeon: Jerene Bears, MD;  [facility];  Service: Endoscopy;  Laterality: N/A;  . ESOPHAGEAL MANOMETRY N/A 06/29/2013   Procedure: ESOPHAGEAL MANOMETRY (EM);  Surgeon: Jerene Bears, MD;  [facility];  Service: Gastroenterology;  Laterality: N/A;  . HELLER MYOTOMY N/A 10/08/2013   Procedure: LAPAROSCOPIC HELLER MYOTOMY, DOR FUNDIPLICATION, UPPER ENDOSCOPY;  Surgeon: Odis Hollingshead, MD;  [facility];  Service: General;  Laterality: N/A;  . HERNIA REPAIR    . HYSTEROSCOPY WITH RESECTOSCOPE N/A 05/03/2015   Procedure: Diagnostic Hysteroscopy;  Surgeon: Lavonia Drafts, MD;  Location: [facility];  Service: Gynecology;  Laterality: N/A;  wants Novasure and myosure.  Marland Kitchen LYSIS OF ADHESION  06/28/2015   Procedure: LYSIS OF ADHESION;  Surgeon: Osborne Oman, MD;  Location: [facility];  Service: Gynecology;;  . OTHER SURGICAL HISTORY     biopsy axillary ,right arm  . right arm biopsy    . UPPER GI ENDOSCOPY N/A 10/08/2013   Procedure: UPPER GI ENDOSCOPY;  Surgeon: Odis Hollingshead, MD;  [facility];  Service: General;  Laterality: N/A;  . WISDOM TOOTH EXTRACTION     Social History   Occupational History  . Occupation: House wife  Tobacco Use  . Smoking status: Never Smoker  . Smokeless tobacco: Never Used  Substance and Sexual Activity  . Alcohol use: No  . Drug use: No  . Sexual activity: Not Currently    Partners: Male    Birth control/protection: Surgical    Comment: pt. given condoms

## 2017-08-09 ENCOUNTER — Other Ambulatory Visit: Payer: Self-pay | Admitting: Infectious Diseases

## 2017-08-09 DIAGNOSIS — B20 Human immunodeficiency virus [HIV] disease: Secondary | ICD-10-CM

## 2017-08-13 ENCOUNTER — Encounter: Payer: Medicare Other | Admitting: Physical Therapy

## 2017-08-15 ENCOUNTER — Encounter: Payer: Medicare Other | Admitting: Physical Therapy

## 2017-08-15 DIAGNOSIS — F411 Generalized anxiety disorder: Secondary | ICD-10-CM | POA: Diagnosis not present

## 2017-08-20 ENCOUNTER — Encounter: Payer: Medicare Other | Admitting: Physical Therapy

## 2017-08-22 ENCOUNTER — Encounter: Payer: Medicare Other | Admitting: Physical Therapy

## 2017-08-27 ENCOUNTER — Encounter: Payer: Medicare Other | Admitting: Physical Therapy

## 2017-08-27 ENCOUNTER — Other Ambulatory Visit: Payer: Self-pay | Admitting: Infectious Diseases

## 2017-08-27 DIAGNOSIS — B20 Human immunodeficiency virus [HIV] disease: Secondary | ICD-10-CM

## 2017-08-29 ENCOUNTER — Ambulatory Visit (INDEPENDENT_AMBULATORY_CARE_PROVIDER_SITE_OTHER): Payer: Medicare Other | Admitting: Orthopedic Surgery

## 2017-08-29 ENCOUNTER — Encounter: Payer: Medicare Other | Admitting: Physical Therapy

## 2017-08-29 ENCOUNTER — Encounter (INDEPENDENT_AMBULATORY_CARE_PROVIDER_SITE_OTHER): Payer: Self-pay | Admitting: Orthopedic Surgery

## 2017-08-29 DIAGNOSIS — S93411A Sprain of calcaneofibular ligament of right ankle, initial encounter: Secondary | ICD-10-CM | POA: Diagnosis not present

## 2017-08-29 DIAGNOSIS — M25871 Other specified joint disorders, right ankle and foot: Secondary | ICD-10-CM | POA: Diagnosis not present

## 2017-08-29 NOTE — Progress Notes (Signed)
Office Visit Note   Patient: Danielle [name]           Date of Birth: [birth year]           MRN: [id number] Visit Date: 08/29/2017              Requested by: Campbell Riches, MD [place], [place] [address] PCP: Campbell Riches, MD  Chief Complaint  Patient presents with  . Right Ankle - Follow-up      HPI: Patient is a 46 year old woman who is 3 months status post a recurrent sprain to the lateral ankle ligaments.  Patient has been wearing an ankle stabilizing orthosis with minimal relief.  Patient did receive an injection for the ankle at last visit she states that she had excellent pain relief immediately she states she went out doing a lot of activities and has had increased pain since that time.  Patient states that recently she was at home playing on the floor when her grandson stepped on her ankle sustaining increasing pain.  Assessment & Plan: Visit Diagnoses:  1. Impingement of right ankle joint   2. Sprain of calcaneofibular ligament of right ankle, initial encounter     Plan: Due to her temporary relief with an injection and persistent pain despite 3 months of conservative care patient states that she would like to proceed with arthroscopic debridement for the impingement.  Risks and benefits were discussed including infection neurovascular injury persistent pain.  Discussed that she should have about 90% pain relief from the surgery.  Patient states she understands and wishes to proceed at this time.  Follow-Up Instructions: Return in about 2 weeks (around 09/12/2017), or if symptoms worsen or fail to improve.   Ortho Exam  Patient is alert, oriented, no adenopathy, well-dressed, normal affect, normal respiratory effort. Semination patient has a good dorsalis pedis pulse dorsiflexion to neutral.  She has good ankle and subtalar motion there are no dystrophic changes there is no swelling she is tender to palpation over the anterior joint line.   Anterior drawer is stable with no laxity of the lateral ligaments.  No increased varus instability.  Imaging: No results found. No images are attached to the encounter.  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 04/27/2015    @LABSALLVALUES (HGBA1)@  There is no height or weight on file to calculate BMI.  Orders:  No orders of the defined types were placed in this encounter.  No orders of the defined types were placed in this encounter.    Procedures: No procedures performed  Clinical Data: No additional findings.  ROS:  All other systems negative, except as noted in the HPI. Review of Systems  Objective: Vital Signs: LMP  (LMP Unknown)   Specialty Comments:  No specialty comments available.  PMFS History: Patient Active Problem List   Diagnosis Date Noted  . Sprain of calcaneofibular ligament of right ankle 07/04/2017  . Ankle pain, right 06/24/2017  . Hyperglycemia 02/18/2017  . Hepatitis B immune 02/18/2017  . 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 S/P total abdominal hysterectomy 06/28/2015  . Abnormal uterine bleeding (AUB) 05/03/2015  . Condyloma acuminatum 02/23/2015  . Fibroids 01/05/2015  . Urinary incontinence 01/05/2015  . Abnormal finding on GI tract imaging 10/12/2013  . Anemia-chronic 10/09/2013  . Achalasia s/p laparoscopic Heller myotomy and Dor fundoplication 19/14/7829  . Ganglion cyst of finger of  right hand 07/06/2013  . Trichomoniasis of vagina 06/03/2013  . GERD (gastroesophageal reflux disease) 03/16/2013  . Depression 02/10/2009  . Human immunodeficiency virus (HIV) disease ([place]) 03/24/2007  . Anxiety state 03/24/2007  . ASTHMA 03/24/2007   Past Medical History:  Diagnosis Date  . Anemia   . Anxiety   . Asthma   . Bulging lumbar disc   . Chronic back pain   . Depression   . Genital HSV   . GERD (gastroesophageal reflux disease)   . Hepatitis   . HIV  infection ([place])   . Migraines   . Pneumonia     Family History  Problem Relation Age of Onset  . Hyperlipidemia Mother   . Diabetes Mother   . Stroke Mother   . Diabetes Brother   . Colon cancer Father   . Esophageal cancer Neg Hx   . Rectal cancer Neg Hx   . Stomach cancer Neg Hx     Past Surgical History:  Procedure Laterality Date  . ABDOMINAL HYSTERECTOMY N/A 06/28/2015   Procedure: HYSTERECTOMY ABDOMINAL;  Surgeon: Osborne Oman, MD;  Location: [facility];  Service: Gynecology;  Laterality: N/A;  Requested 06/28/15 @ 7:30a  . BILATERAL SALPINGECTOMY Bilateral 06/28/2015   Procedure: BILATERAL SALPINGECTOMY;  Surgeon: Osborne Oman, MD;  Location: [facility];  Service: Gynecology;  Laterality: Bilateral;  . CESAREAN SECTION     twice;  vertical incision x 2  . COLONOSCOPY N/A 10/12/2013   Procedure: COLONOSCOPY;  Surgeon: Jerene Bears, MD;  [facility];  Service: Endoscopy;  Laterality: N/A;  . ESOPHAGEAL MANOMETRY N/A 06/29/2013   Procedure: ESOPHAGEAL MANOMETRY (EM);  Surgeon: Jerene Bears, MD;  [facility];  Service: Gastroenterology;  Laterality: N/A;  . HELLER MYOTOMY N/A 10/08/2013   Procedure: LAPAROSCOPIC HELLER MYOTOMY, DOR FUNDIPLICATION, UPPER ENDOSCOPY;  Surgeon: Odis Hollingshead, MD;  [facility];  Service: General;  Laterality: N/A;  . HERNIA REPAIR    . HYSTEROSCOPY WITH RESECTOSCOPE N/A 05/03/2015   Procedure: Diagnostic Hysteroscopy;  Surgeon: Lavonia Drafts, MD;  Location: [facility];  Service: Gynecology;  Laterality: N/A;  wants Novasure and myosure.  Marland Kitchen LYSIS OF ADHESION  06/28/2015   Procedure: LYSIS OF ADHESION;  Surgeon: Osborne Oman, MD;  Location: [facility];  Service: Gynecology;;  . OTHER SURGICAL HISTORY     biopsy axillary ,right arm  . right arm biopsy    . UPPER GI ENDOSCOPY N/A 10/08/2013   Procedure: UPPER GI ENDOSCOPY;  Surgeon: Odis Hollingshead, MD;  [facility];  Service: General;  Laterality: N/A;  . WISDOM  TOOTH EXTRACTION     Social History   Occupational History  . Occupation: House wife  Tobacco Use  . Smoking status: Never Smoker  . Smokeless tobacco: Never Used  Substance and Sexual Activity  . Alcohol use: No  . Drug use: No  . Sexual activity: Not Currently    Partners: Male    Birth control/protection: Surgical    Comment: pt. given condoms

## 2017-09-16 DIAGNOSIS — D229 Melanocytic nevi, unspecified: Secondary | ICD-10-CM | POA: Diagnosis not present

## 2017-09-16 DIAGNOSIS — L918 Other hypertrophic disorders of the skin: Secondary | ICD-10-CM | POA: Diagnosis not present

## 2017-09-20 ENCOUNTER — Telehealth (INDEPENDENT_AMBULATORY_CARE_PROVIDER_SITE_OTHER): Payer: Self-pay | Admitting: Orthopedic Surgery

## 2017-09-20 NOTE — Telephone Encounter (Signed)
Patient left a voicemail needing to cancel her ankle surgery on 01/30 with Dr. Sharol Given and reschedule for some time after the 3rd of February. CB [phone]

## 2017-09-23 DIAGNOSIS — F411 Generalized anxiety disorder: Secondary | ICD-10-CM | POA: Diagnosis not present

## 2017-09-23 DIAGNOSIS — F410 Panic disorder [episodic paroxysmal anxiety] without agoraphobia: Secondary | ICD-10-CM | POA: Diagnosis not present

## 2017-10-02 DIAGNOSIS — F411 Generalized anxiety disorder: Secondary | ICD-10-CM | POA: Diagnosis not present

## 2017-10-02 DIAGNOSIS — F3132 Bipolar disorder, current episode depressed, moderate: Secondary | ICD-10-CM | POA: Diagnosis not present

## 2017-10-03 ENCOUNTER — Other Ambulatory Visit (INDEPENDENT_AMBULATORY_CARE_PROVIDER_SITE_OTHER): Payer: Self-pay | Admitting: Family

## 2017-10-04 DIAGNOSIS — F411 Generalized anxiety disorder: Secondary | ICD-10-CM | POA: Diagnosis not present

## 2017-10-04 DIAGNOSIS — F410 Panic disorder [episodic paroxysmal anxiety] without agoraphobia: Secondary | ICD-10-CM | POA: Diagnosis not present

## 2017-10-15 ENCOUNTER — Encounter (HOSPITAL_COMMUNITY): Payer: Self-pay | Admitting: *Deleted

## 2017-10-15 ENCOUNTER — Other Ambulatory Visit: Payer: Self-pay

## 2017-10-15 DIAGNOSIS — N3941 Urge incontinence: Secondary | ICD-10-CM | POA: Diagnosis not present

## 2017-10-15 DIAGNOSIS — R350 Frequency of micturition: Secondary | ICD-10-CM | POA: Diagnosis not present

## 2017-10-15 NOTE — Progress Notes (Addendum)
Spoke with pt for pre-op call. Pt denies cardiac history, chest pain, sob. States she was told a few months ago she was "pre-diabetic". Does not know what her A1C was. Last one in Epic was 5.7 in May, 2018

## 2017-10-16 ENCOUNTER — Encounter (HOSPITAL_COMMUNITY): Payer: Self-pay | Admitting: Urology

## 2017-10-16 ENCOUNTER — Encounter (HOSPITAL_COMMUNITY)
Admission: RE | Disposition: A | Discharge status: Home / Self Care | Payer: Self-pay | Source: Ambulatory Visit | Attending: Orthopedic Surgery

## 2017-10-16 ENCOUNTER — Ambulatory Visit (HOSPITAL_COMMUNITY)
Admission: RE | Admit: 2017-10-16 | Discharge: 2017-10-16 | Disposition: A | Discharge status: Home / Self Care | Payer: Medicare Other | Source: Ambulatory Visit | Attending: Orthopedic Surgery | Admitting: Orthopedic Surgery

## 2017-10-16 ENCOUNTER — Ambulatory Visit (HOSPITAL_COMMUNITY): Payer: Medicare Other | Admitting: Anesthesiology

## 2017-10-16 DIAGNOSIS — F419 Anxiety disorder, unspecified: Secondary | ICD-10-CM | POA: Diagnosis not present

## 2017-10-16 DIAGNOSIS — Z7951 Long term (current) use of inhaled steroids: Secondary | ICD-10-CM | POA: Insufficient documentation

## 2017-10-16 DIAGNOSIS — Z21 Asymptomatic human immunodeficiency virus [HIV] infection status: Secondary | ICD-10-CM | POA: Insufficient documentation

## 2017-10-16 DIAGNOSIS — Z79899 Other long term (current) drug therapy: Secondary | ICD-10-CM | POA: Diagnosis not present

## 2017-10-16 DIAGNOSIS — K219 Gastro-esophageal reflux disease without esophagitis: Secondary | ICD-10-CM | POA: Insufficient documentation

## 2017-10-16 DIAGNOSIS — F329 Major depressive disorder, single episode, unspecified: Secondary | ICD-10-CM | POA: Diagnosis not present

## 2017-10-16 DIAGNOSIS — G8918 Other acute postprocedural pain: Secondary | ICD-10-CM | POA: Diagnosis not present

## 2017-10-16 DIAGNOSIS — B20 Human immunodeficiency virus [HIV] disease: Secondary | ICD-10-CM | POA: Diagnosis not present

## 2017-10-16 DIAGNOSIS — M25871 Other specified joint disorders, right ankle and foot: Secondary | ICD-10-CM | POA: Diagnosis not present

## 2017-10-16 DIAGNOSIS — Z79891 Long term (current) use of opiate analgesic: Secondary | ICD-10-CM | POA: Insufficient documentation

## 2017-10-16 DIAGNOSIS — J45909 Unspecified asthma, uncomplicated: Secondary | ICD-10-CM | POA: Diagnosis not present

## 2017-10-16 DIAGNOSIS — S93411A Sprain of calcaneofibular ligament of right ankle, initial encounter: Secondary | ICD-10-CM | POA: Diagnosis not present

## 2017-10-16 DIAGNOSIS — Z791 Long term (current) use of non-steroidal anti-inflammatories (NSAID): Secondary | ICD-10-CM | POA: Insufficient documentation

## 2017-10-16 HISTORY — DX: Prediabetes: R73.03

## 2017-10-16 HISTORY — PX: ANKLE ARTHROSCOPY: SHX545

## 2017-10-16 LAB — COMPREHENSIVE METABOLIC PANEL
ALBUMIN: 3.9 g/dL (ref 3.5–5.0)
ALT: 15 U/L (ref 14–54)
AST: 22 U/L (ref 15–41)
Alkaline Phosphatase: 106 U/L (ref 38–126)
Anion gap: 11 (ref 5–15)
BUN: 15 mg/dL (ref 6–20)
CHLORIDE: 105 mmol/L (ref 101–111)
CO2: 23 mmol/L (ref 22–32)
CREATININE: 1.24 mg/dL — AB (ref 0.44–1.00)
Calcium: 9.4 mg/dL (ref 8.9–10.3)
GFR calc Af Amer: >60 mL/min (ref >60)
GFR calc non Af Amer: 52 mL/min — ABNORMAL LOW (ref >60)
GLUCOSE: 101 mg/dL — AB (ref 65–99)
POTASSIUM: 3.8 mmol/L (ref 3.5–5.1)
SODIUM: 139 mmol/L (ref 135–145)
Total Bilirubin: 0.6 mg/dL (ref 0.3–1.2)
Total Protein: 7 g/dL (ref 6.5–8.1)

## 2017-10-16 LAB — CBC
HEMATOCRIT: 40.8 % (ref 36.0–46.0)
HEMOGLOBIN: 13.4 g/dL (ref 12.0–15.0)
MCH: 30 pg (ref 26.0–34.0)
MCHC: 32.8 g/dL (ref 30.0–36.0)
MCV: 91.5 fL (ref 78.0–100.0)
Platelets: 211 10*3/uL (ref 150–400)
RBC: 4.46 MIL/uL (ref 3.87–5.11)
RDW: 14 % (ref 11.5–15.5)
WBC: 4.1 10*3/uL (ref 4.0–10.5)

## 2017-10-16 LAB — HEMOGLOBIN A1C
HEMOGLOBIN A1C: 5.7 % — AB (ref 4.8–5.6)
MEAN PLASMA GLUCOSE: 116.89 mg/dL

## 2017-10-16 LAB — GLUCOSE, CAPILLARY: GLUCOSE-CAPILLARY: 96 mg/dL (ref 65–99)

## 2017-10-16 SURGERY — ARTHROSCOPY, ANKLE
Anesthesia: General | Site: Ankle | Laterality: Right

## 2017-10-16 MED ORDER — LIDOCAINE 2% (20 MG/ML) 5 ML SYRINGE
INTRAMUSCULAR | Status: DC | PRN
  Administered 2017-10-16: 60 mg via INTRAVENOUS

## 2017-10-16 MED ORDER — FENTANYL CITRATE (PF) 100 MCG/2ML IJ SOLN
INTRAMUSCULAR | Status: AC
  Administered 2017-10-16: 100 ug via INTRAVENOUS
  Filled 2017-10-16: qty 2

## 2017-10-16 MED ORDER — CEFAZOLIN SODIUM-DEXTROSE 2-4 GM/100ML-% IV SOLN
INTRAVENOUS | Status: AC
Start: 2017-10-16 — End: 2017-10-16
  Filled 2017-10-16: qty 100

## 2017-10-16 MED ORDER — OXYCODONE HCL 5 MG PO TABS: 5.0000 mg | ORAL_TABLET | Freq: Once | ORAL | Status: DC | PRN

## 2017-10-16 MED ORDER — ROPIVACAINE HCL 5 MG/ML IJ SOLN
INTRAMUSCULAR | Status: DC | PRN
  Administered 2017-10-16: 30 mL via PERINEURAL

## 2017-10-16 MED ORDER — PROMETHAZINE HCL 25 MG/ML IJ SOLN: 6.2500 mg | INTRAMUSCULAR | Status: DC | PRN

## 2017-10-16 MED ORDER — HYDROCODONE-ACETAMINOPHEN 5-325 MG PO TABS: 1.0000 | ORAL_TABLET | ORAL | 0 refills | Status: DC | PRN

## 2017-10-16 MED ORDER — MIDAZOLAM HCL 2 MG/2ML IJ SOLN
INTRAMUSCULAR | Status: AC
  Filled 2017-10-16: qty 2

## 2017-10-16 MED ORDER — ONDANSETRON HCL 4 MG/2ML IJ SOLN
INTRAMUSCULAR | Status: DC | PRN
  Administered 2017-10-16: 4 mg via INTRAVENOUS

## 2017-10-16 MED ORDER — FENTANYL CITRATE (PF) 250 MCG/5ML IJ SOLN
INTRAMUSCULAR | Status: AC
  Filled 2017-10-16: qty 5

## 2017-10-16 MED ORDER — HYDROMORPHONE HCL 1 MG/ML IJ SOLN: 0.2500 mg | INTRAMUSCULAR | Status: DC | PRN

## 2017-10-16 MED ORDER — MIDAZOLAM HCL 2 MG/2ML IJ SOLN
INTRAMUSCULAR | Status: AC
  Administered 2017-10-16: 2 mg via INTRAVENOUS
  Filled 2017-10-16: qty 2

## 2017-10-16 MED ORDER — CEFAZOLIN SODIUM-DEXTROSE 2-4 GM/100ML-% IV SOLN
2.0000 g | INTRAVENOUS | Status: AC
  Administered 2017-10-16: 2 g via INTRAVENOUS

## 2017-10-16 MED ORDER — PROPOFOL 10 MG/ML IV BOLUS
INTRAVENOUS | Status: DC | PRN
  Administered 2017-10-16: 160 mg via INTRAVENOUS

## 2017-10-16 MED ORDER — SODIUM CHLORIDE 0.9 % IR SOLN
Status: DC | PRN
Start: 2017-10-16 — End: 2017-10-16
  Administered 2017-10-16: 3000 mL

## 2017-10-16 MED ORDER — LACTATED RINGERS IV SOLN
INTRAVENOUS | Status: DC
  Administered 2017-10-16: 08:00:00 via INTRAVENOUS

## 2017-10-16 MED ORDER — MEPERIDINE HCL 50 MG/ML IJ SOLN: 6.2500 mg | INTRAMUSCULAR | Status: DC | PRN

## 2017-10-16 MED ORDER — DEXAMETHASONE SODIUM PHOSPHATE 10 MG/ML IJ SOLN
INTRAMUSCULAR | Status: DC | PRN
  Administered 2017-10-16: 5 mg via INTRAVENOUS

## 2017-10-16 MED ORDER — OXYCODONE HCL 5 MG/5ML PO SOLN: 5.0000 mg | Freq: Once | ORAL | Status: DC | PRN

## 2017-10-16 MED ORDER — CHLORHEXIDINE GLUCONATE 4 % EX LIQD: 60.0000 mL | Freq: Once | CUTANEOUS | Status: DC

## 2017-10-16 MED ORDER — FENTANYL CITRATE (PF) 100 MCG/2ML IJ SOLN
100.0000 ug | Freq: Once | INTRAMUSCULAR | Status: AC
  Administered 2017-10-16: 100 ug via INTRAVENOUS

## 2017-10-16 MED ORDER — MIDAZOLAM HCL 2 MG/2ML IJ SOLN
2.0000 mg | Freq: Once | INTRAMUSCULAR | Status: AC
  Administered 2017-10-16: 2 mg via INTRAVENOUS

## 2017-10-16 MED ORDER — PROPOFOL 10 MG/ML IV BOLUS
INTRAVENOUS | Status: AC
  Filled 2017-10-16: qty 20

## 2017-10-16 SURGICAL SUPPLY — 47 items
BLADE CUDA 5.5 (BLADE) IMPLANT
BLADE GREAT WHITE 4.2 (BLADE) ×1 IMPLANT
BLADE INCISOR PLUS 5.5 (BLADE) IMPLANT
BNDG COHESIVE 6X5 TAN STRL LF (GAUZE/BANDAGES/DRESSINGS) ×2 IMPLANT
BNDG GAUZE ELAST 4 BULKY (GAUZE/BANDAGES/DRESSINGS) ×1 IMPLANT
BUR OVAL 4.0 (BURR) IMPLANT
CANNULA SHOULDER 7CM (CANNULA) ×1 IMPLANT
COVER SURGICAL LIGHT HANDLE (MISCELLANEOUS) ×3 IMPLANT
CUFF TOURNIQUET SINGLE 34IN LL (TOURNIQUET CUFF) IMPLANT
CUFF TOURNIQUET SINGLE 44IN (TOURNIQUET CUFF) IMPLANT
DRAPE ARTHROSCOPY W/POUCH 114 (DRAPES) ×2 IMPLANT
DRAPE C-ARM MINI 42X72 WSTRAPS (DRAPES) IMPLANT
DRAPE U-SHAPE 47X51 STRL (DRAPES) ×2 IMPLANT
DRSG EMULSION OIL 3X3 NADH (GAUZE/BANDAGES/DRESSINGS) ×2 IMPLANT
DRSG PAD ABDOMINAL 8X10 ST (GAUZE/BANDAGES/DRESSINGS) ×2 IMPLANT
DURAPREP 26ML APPLICATOR (WOUND CARE) ×2 IMPLANT
GAUZE SPONGE 4X4 12PLY STRL (GAUZE/BANDAGES/DRESSINGS) ×2 IMPLANT
GLOVE BIOGEL PI IND STRL 9 (GLOVE) ×1 IMPLANT
GLOVE BIOGEL PI INDICATOR 9 (GLOVE) ×1
GLOVE SURG ORTHO 9.0 STRL STRW (GLOVE) ×1 IMPLANT
GOWN STRL REUS W/ TWL XL LVL3 (GOWN DISPOSABLE) ×3 IMPLANT
GOWN STRL REUS W/TWL XL LVL3 (GOWN DISPOSABLE) ×2
KIT BASIN OR (CUSTOM PROCEDURE TRAY) ×2 IMPLANT
KIT ROOM TURNOVER OR (KITS) ×2 IMPLANT
MANIFOLD NEPTUNE II (INSTRUMENTS) ×2 IMPLANT
NDL 18GX1X1/2 (RX/OR ONLY) (NEEDLE) ×1 IMPLANT
NDL SPNL 18GX3.5 QUINCKE PK (NEEDLE) IMPLANT
NEEDLE 18GX1X1/2 (RX/OR ONLY) (NEEDLE) IMPLANT
NEEDLE SPNL 18GX3.5 QUINCKE PK (NEEDLE) ×2 IMPLANT
PACK ARTHROSCOPY DSU (CUSTOM PROCEDURE TRAY) ×2 IMPLANT
PAD ABD 8X10 STRL (GAUZE/BANDAGES/DRESSINGS) ×1 IMPLANT
PAD ARMBOARD 7.5X6 YLW CONV (MISCELLANEOUS) ×4 IMPLANT
PADDING CAST COTTON 6X4 STRL (CAST SUPPLIES) ×1 IMPLANT
SET ARTHROSCOPY TUBING (MISCELLANEOUS) ×2
SET ARTHROSCOPY TUBING LN (MISCELLANEOUS) ×1 IMPLANT
SPONGE LAP 4X18 X RAY DECT (DISPOSABLE) ×2 IMPLANT
SUT ETHILON 2 0 PSLX (SUTURE) ×1 IMPLANT
SUT ETHILON 4 0 PS 2 18 (SUTURE) ×1 IMPLANT
SUT MNCRL AB 3-0 PS2 18 (SUTURE) IMPLANT
SUT VIC AB 2-0 CT1 27 (SUTURE)
SUT VIC AB 2-0 CT1 TAPERPNT 27 (SUTURE) IMPLANT
SYR 20CC LL (SYRINGE) ×1 IMPLANT
TAPE STRIPS DRAPE STRL (GAUZE/BANDAGES/DRESSINGS) IMPLANT
TOWEL OR 17X24 6PK STRL BLUE (TOWEL DISPOSABLE) ×2 IMPLANT
TOWEL OR 17X26 10 PK STRL BLUE (TOWEL DISPOSABLE) ×1 IMPLANT
WAND HAND CNTRL MULTIVAC 90 (MISCELLANEOUS) IMPLANT
WATER STERILE IRR 1000ML POUR (IV SOLUTION) ×1 IMPLANT

## 2017-10-16 NOTE — Op Note (Signed)
10/16/2017  9:56 AM  PATIENT:  [name]    PRE-OPERATIVE DIAGNOSIS:  Impingement Right Ankle  POST-OPERATIVE DIAGNOSIS:  Same  PROCEDURE:  RIGHT ANKLE ARTHROSCOPIC DEBRIDEMENT  SURGEON:  Newt Minion, MD  PHYSICIAN ASSISTANT:None ANESTHESIA:   General  PREOPERATIVE INDICATIONS:  ALYSSE RATHE is a  47 y.o. female with a diagnosis of Impingement Right Ankle who failed conservative measures and elected for surgical management.    The risks benefits and alternatives were discussed with the patient preoperatively including but not limited to the risks of infection, bleeding, nerve injury, cardiopulmonary complications, the need for revision surgery, among others, and the patient was willing to proceed.  OPERATIVE IMPLANTS: None  OPERATIVE FINDINGS: Extensive synovitis  OPERATIVE PROCEDURE: Patient was brought to the operating room after undergoing a regional block she underwent a general anesthetic.  After adequate levels of anesthesia were obtained patient's right lower extremity was prepped using DuraPrep draped into a sterile field a timeout was called.  The scope was inserted to the anterior medial portal and anterior lateral portal was established for working portal.  The skin was incised blunt dissection was carried down to the capsule with a blunt trocar was used to insert into the joint.  Initial examination showed extensive synovitis with the joint completely full of inflamed synovial tissue.  Using the electrical wand and the shaver extensive debridement was performed to debride the inflamed synovial tissue.  Examination of the joint was congruent there was good articular cartilage in the medial lateral gutters as well as over the joint surface.  There is no osteochondral defects.  A further survey showed to be no further impingement after debridement.  The incisions were removed the portals were closed using 2-0 nylon and a sterile dressing was applied patient was extubated  taken the PACU in stable condition.   DISCHARGE PLANNING:  Antibiotic duration: Perioperative weight  Weightbearing: Weightbearing as tolerated  Pain medication: Vicodin  Dressing care/ Wound VAC: Discontinue dressing in 2 days  Ambulatory devices: Crutches  Discharge to: Home  Follow-up: In the office 1 week post operative.

## 2017-10-16 NOTE — H&P (Signed)
Danielle [name] is an 47 y.o. female.   Chief Complaint: Chronic right ankle pain. HPI: Patient is a 47 year old woman who is 3 months status post a recurrent sprain to the lateral ankle ligaments.  Patient has been wearing an ankle stabilizing orthosis with minimal relief.  Patient did receive an injection for the ankle at last visit she states that she had excellent pain relief immediately she states she went out doing a lot of activities and has had increased pain since that time.    Past Medical History:  Diagnosis Date  . Anemia   . Anxiety   . Asthma   . Bulging lumbar disc   . Chronic back pain   . Depression   . Genital HSV   . GERD (gastroesophageal reflux disease)   . Hepatitis    States she got a shot for this  . HIV infection ([place])   . Migraines   . Pneumonia   . Pre-diabetes     Past Surgical History:  Procedure Laterality Date  . ABDOMINAL HYSTERECTOMY N/A 06/28/2015   Procedure: HYSTERECTOMY ABDOMINAL;  Surgeon: Osborne Oman, MD;  Location: [facility];  Service: Gynecology;  Laterality: N/A;  Requested 06/28/15 @ 7:30a  . BILATERAL SALPINGECTOMY Bilateral 06/28/2015   Procedure: BILATERAL SALPINGECTOMY;  Surgeon: Osborne Oman, MD;  Location: [facility];  Service: Gynecology;  Laterality: Bilateral;  . CESAREAN SECTION     twice;  vertical incision x 2  . COLONOSCOPY N/A 10/12/2013   Procedure: COLONOSCOPY;  Surgeon: Jerene Bears, MD;  [facility];  Service: Endoscopy;  Laterality: N/A;  . ESOPHAGEAL MANOMETRY N/A 06/29/2013   Procedure: ESOPHAGEAL MANOMETRY (EM);  Surgeon: Jerene Bears, MD;  [facility];  Service: Gastroenterology;  Laterality: N/A;  . HELLER MYOTOMY N/A 10/08/2013   Procedure: LAPAROSCOPIC HELLER MYOTOMY, DOR FUNDIPLICATION, UPPER ENDOSCOPY;  Surgeon: Odis Hollingshead, MD;  Location: [facility];  Service: General;  Laterality: N/A;  . HERNIA REPAIR    . HYSTEROSCOPY WITH RESECTOSCOPE N/A 05/03/2015   Procedure: Diagnostic  Hysteroscopy;  Surgeon: Lavonia Drafts, MD;  Location: [facility];  Service: Gynecology;  Laterality: N/A;  wants Novasure and myosure.  Marland Kitchen LYSIS OF ADHESION  06/28/2015   Procedure: LYSIS OF ADHESION;  Surgeon: Osborne Oman, MD;  Location: [facility];  Service: Gynecology;;  . OTHER SURGICAL HISTORY     biopsy axillary ,right arm  . right arm biopsy    . UPPER GI ENDOSCOPY N/A 10/08/2013   Procedure: UPPER GI ENDOSCOPY;  Surgeon: Odis Hollingshead, MD;  [facility]: [facility];  Service: General;  Laterality: N/A;  . WISDOM TOOTH EXTRACTION      Family History  Problem Relation Age of Onset  . Hyperlipidemia Mother   . Diabetes Mother   . Stroke Mother   . Diabetes Brother   . Colon cancer Father   . Esophageal cancer Neg Hx   . Rectal cancer Neg Hx   . Stomach cancer Neg Hx    Social History:  reports that  has never smoked. she has never used smokeless tobacco. She reports that she does not drink alcohol or use drugs.  Allergies:  Allergies  Allergen Reactions  . Latex Rash    No medications prior to admission.    No results found for this or any previous visit (from the past 48 hour(s)). No results found.  Review of Systems  All other systems reviewed and are negative.   Weight 160 lb 15 oz (  73 kg). Physical Exam   Patient is alert, oriented, no adenopathy, well-dressed, normal affect, normal respiratory effort. Semination patient has a good dorsalis pedis pulse dorsiflexion to neutral.  She has good ankle and subtalar motion there are no dystrophic changes there is no swelling she is tender to palpation over the anterior joint line.  Anterior drawer is stable with no laxity of the lateral ligaments.  No increased varus instability.   Assessment/Plan 1. Impingement of right ankle joint   2. Sprain of calcaneofibular ligament of right ankle, initial encounter     Plan: Due to her temporary relief with an injection and persistent pain despite 3 months of  conservative care patient states that she would like to proceed with arthroscopic debridement for the impingement.  Risks and benefits were discussed including infection neurovascular injury persistent pain.  Discussed that she should have about 90% pain relief from the surgery.  Patient states she understands and wishes to proceed at this time.     Newt Minion, MD 10/16/2017, 7:02 AM

## 2017-10-16 NOTE — Anesthesia Preprocedure Evaluation (Signed)
Anesthesia Evaluation  Patient identified by MRN, date of birth, ID band Patient awake    Reviewed: Allergy & Precautions, H&P , NPO status , Patient's Chart, lab work & pertinent test results  Airway Mallampati: I  TM Distance: >3 FB Neck ROM: full    Dental no notable dental hx. (+) Teeth Intact   Pulmonary asthma ,    Pulmonary exam normal breath sounds clear to auscultation       Cardiovascular negative cardio ROS Normal cardiovascular exam Rhythm:Regular Rate:Normal     Neuro/Psych  Headaches, Anxiety Depression    GI/Hepatic GERD  Controlled,(+) Hepatitis -  Endo/Other  negative endocrine ROS  Renal/GU negative Renal ROS     Musculoskeletal  (+) Arthritis , Chronic back pain   Abdominal Normal abdominal exam  (+)   Peds  Hematology  (+) anemia , HIV,   Anesthesia Other Findings Taking antiviral medication for HIV  Reproductive/Obstetrics negative OB ROS                             Anesthesia Physical  Anesthesia Plan  ASA: III  Anesthesia Plan: General   Post-op Pain Management: GA combined w/ Regional for post-op pain   Induction: Intravenous  PONV Risk Score and Plan: 3 and Ondansetron, Dexamethasone and Midazolam  Airway Management Planned: LMA  Additional Equipment:   Intra-op Plan:   Post-operative Plan: Extubation in OR  Informed Consent: I have reviewed the patients History and Physical, chart, labs and discussed the procedure including the risks, benefits and alternatives for the proposed anesthesia with the patient or authorized representative who has indicated his/her understanding and acceptance.   Dental advisory given  Plan Discussed with: CRNA and Anesthesiologist  Anesthesia Plan Comments:         Anesthesia Quick Evaluation

## 2017-10-16 NOTE — Anesthesia Procedure Notes (Signed)
Procedure Name: LMA Insertion Date/Time: 10/16/2017 9:30 AM Performed by: White, Amedeo Plenty, CRNA Pre-anesthesia Checklist: Patient identified, Emergency Drugs available, Suction available and Patient being monitored Patient Re-evaluated:Patient Re-evaluated prior to induction Oxygen Delivery Method: Circle System Utilized Preoxygenation: Pre-oxygenation with 100% oxygen Induction Type: IV induction LMA: LMA inserted LMA Size: 4.0 Number of attempts: 1 Placement Confirmation: positive ETCO2 Tube secured with: Tape Dental Injury: Teeth and Oropharynx as per pre-operative assessment

## 2017-10-16 NOTE — Anesthesia Procedure Notes (Signed)
Anesthesia Regional Block: Popliteal block   Pre-Anesthetic Checklist: ,, timeout performed, Correct Patient, Correct Site, Correct Laterality, Correct Procedure, Correct Position, site marked, Risks and benefits discussed,  Surgical consent,  Pre-op evaluation,  At surgeon's request and post-op pain management  Laterality: Right  Prep: chloraprep       Needles:  Injection technique: Single-shot  Needle Type: Stimiplex     Needle Length: 9cm  Needle Gauge: 21     Additional Needles:   Procedures:,,,, ultrasound used (permanent image in chart),,,,  Narrative:  Start time: 10/16/2017 9:14 AM End time: 10/16/2017 9:19 AM Injection made incrementally with aspirations every 5 mL.  Performed by: Personally  Anesthesiologist: Lynda Rainwater, MD

## 2017-10-16 NOTE — Transfer of Care (Signed)
Immediate Anesthesia Transfer of Care Note  Patient: Danielle [name]  Procedure(s) Performed: RIGHT ANKLE ARTHROSCOPIC DEBRIDEMENT (Right Ankle)  Patient Location: PACU  Anesthesia Type:GA combined with regional for post-op pain  Level of Consciousness: awake, alert  and patient cooperative  Airway & Oxygen Therapy: Patient Spontanous Breathing  Post-op Assessment: Report given to RN and Post -op Vital signs reviewed and stable  Post vital signs: Reviewed and stable  Last Vitals:  Vitals:   10/16/17 0915 10/16/17 0920  BP: (!) 142/82 113/69  Pulse: 75 65  Resp: 15 11  Temp:    SpO2: 100% 100%    Last Pain:  Vitals:   10/16/17 0742  PainSc: 6       Patients Stated Pain Goal: 3 (94/58/59 2924)  Complications: No apparent anesthesia complications

## 2017-10-16 NOTE — Anesthesia Postprocedure Evaluation (Signed)
Anesthesia Post Note  Patient: Danielle [name]  Procedure(s) Performed: RIGHT ANKLE ARTHROSCOPIC DEBRIDEMENT (Right Ankle)     Patient location during evaluation: PACU Anesthesia Type: General Level of consciousness: awake and alert Pain management: pain level controlled Vital Signs Assessment: post-procedure vital signs reviewed and stable Respiratory status: spontaneous breathing, nonlabored ventilation and respiratory function stable Cardiovascular status: blood pressure returned to baseline and stable Postop Assessment: no apparent nausea or vomiting Anesthetic complications: no    Last Vitals:  Vitals:   10/16/17 1032 10/16/17 1045  BP:  115/72  Pulse: 65 69  Resp: 12 15  Temp: 36.7 C   SpO2: 99% 98%    Last Pain:  Vitals:   10/16/17 0742  PainSc: [name]

## 2017-10-16 NOTE — Progress Notes (Signed)
Orthopedic Tech Progress Note Patient Details:  Danielle [name] [birth year] [id number]  Ortho Devices Type of Ortho Device: Postop shoe/boot Ortho Device/Splint Location: rle Ortho Device/Splint Interventions: Application   Post Interventions Patient Tolerated: Well Instructions Provided: Care of device   Hildred Priest 10/16/2017, 10:21 AM RN stated that pt reported that they have crutches at home

## 2017-10-16 NOTE — Progress Notes (Signed)
Post op shoe placed per Ortho tech, Pt states she has cutches at home

## 2017-10-17 ENCOUNTER — Encounter (HOSPITAL_COMMUNITY): Payer: Self-pay | Admitting: Orthopedic Surgery

## 2017-10-17 DIAGNOSIS — F411 Generalized anxiety disorder: Secondary | ICD-10-CM | POA: Diagnosis not present

## 2017-10-17 DIAGNOSIS — F410 Panic disorder [episodic paroxysmal anxiety] without agoraphobia: Secondary | ICD-10-CM | POA: Diagnosis not present

## 2017-10-23 ENCOUNTER — Encounter (INDEPENDENT_AMBULATORY_CARE_PROVIDER_SITE_OTHER): Payer: Self-pay | Admitting: Family

## 2017-10-23 ENCOUNTER — Ambulatory Visit (INDEPENDENT_AMBULATORY_CARE_PROVIDER_SITE_OTHER): Payer: Medicare Other | Admitting: Family

## 2017-10-23 VITALS — Ht 61.0 in | Wt 161.0 lb

## 2017-10-23 DIAGNOSIS — M25571 Pain in right ankle and joints of right foot: Secondary | ICD-10-CM

## 2017-10-23 NOTE — Progress Notes (Signed)
Post-Op Visit Note   Patient: Danielle [name]           Date of Birth: October 09, 1970           MRN: [id number] Visit Date: 10/23/2017 PCP: Campbell Riches, MD  Chief Complaint:  Chief Complaint  Patient presents with  . Right Ankle - Routine Post Op    Rt ankle arthroscopic debridement 10/16/17.    HPI:  HPI The patient is a 47 year old woman seen one week s/p right ankle arthroscopy. Has not been weight bearing, using crutches.  Spouse is concerned that she should be taking more of her pain medication. Has only taken it twice.  Some sensitivity around incisions.   Ortho Exam Portals are clean and dry. Sutures in place. No erythema, odor. No sign of infection. Minimal swelling.  Visit Diagnoses: No diagnosis found.  Plan: sutures harvested. Begin weight bearing. Work on dorsiflexion and rom of ankle. May elevate and use ice for pain. Ibu if does not want to use Norco. Follow up in office in 2 weeks for clinical re check.  Follow-Up Instructions: No Follow-up on file.   Imaging: No results found.  Orders:  No orders of the defined types were placed in this encounter.  No orders of the defined types were placed in this encounter.    PMFS History: Patient Active Problem List   Diagnosis Date Noted  . Impingement syndrome of right ankle   . Sprain of calcaneofibular ligament of right ankle 07/04/2017  . Ankle pain, right 06/24/2017  . Hyperglycemia 02/18/2017  . Hepatitis B immune 02/18/2017  . 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 S/P total abdominal hysterectomy 06/28/2015  . Abnormal uterine bleeding (AUB) 05/03/2015  . Condyloma acuminatum 02/23/2015  . Fibroids 01/05/2015  . Urinary incontinence 01/05/2015  . Abnormal finding on GI tract imaging 10/12/2013  . Anemia-chronic 10/09/2013  . Achalasia s/p laparoscopic Heller myotomy and Dor fundoplication 08/67/6195  . Ganglion cyst of finger of right hand 07/06/2013  . Trichomoniasis of vagina 06/03/2013  .  GERD (gastroesophageal reflux disease) 03/16/2013  . Depression 02/10/2009  . Human immunodeficiency virus (HIV) disease ([place]) 03/24/2007  . Anxiety state 03/24/2007  . ASTHMA 03/24/2007   Past Medical History:  Diagnosis Date  . Anemia   . Anxiety   . Asthma   . Bulging lumbar disc   . Chronic back pain   . Depression   . Genital HSV   . GERD (gastroesophageal reflux disease)   . Hepatitis    States she got a shot for this  . HIV infection ([place])   . Migraines   . Pneumonia   . Pre-diabetes     Family History  Problem Relation Age of Onset  . Hyperlipidemia Mother   . Diabetes Mother   . Stroke Mother   . Diabetes Brother   . Colon cancer Father   . Esophageal cancer Neg Hx   . Rectal cancer Neg Hx   . Stomach cancer Neg Hx     Past Surgical History:  Procedure Laterality Date  . ABDOMINAL HYSTERECTOMY N/A 06/28/2015   Procedure: HYSTERECTOMY ABDOMINAL;  Surgeon: Osborne Oman, MD;  Location: [facility];  Service: Gynecology;  Laterality: N/A;  Requested 06/28/15 @ 7:30a  . ANKLE ARTHROSCOPY Right 10/16/2017   Procedure: RIGHT ANKLE ARTHROSCOPIC DEBRIDEMENT;  Surgeon: Newt Minion, MD;  Location: [facility];  Service: Orthopedics;  Laterality: Right;  . BILATERAL SALPINGECTOMY Bilateral 06/28/2015   Procedure: BILATERAL SALPINGECTOMY;  Surgeon: Osborne Oman, MD;  Location: [facility];  Service: Gynecology;  Laterality: Bilateral;  . CESAREAN SECTION     twice;  vertical incision x 2  . COLONOSCOPY N/A 10/12/2013   Procedure: COLONOSCOPY;  Surgeon: Jerene Bears, MD;  [facility];  Service: Endoscopy;  Laterality: N/A;  . ESOPHAGEAL MANOMETRY N/A 06/29/2013   Procedure: ESOPHAGEAL MANOMETRY (EM);  Surgeon: Jerene Bears, MD;  [facility];  Service: Gastroenterology;  Laterality: N/A;  . HELLER MYOTOMY N/A 10/08/2013   Procedure: LAPAROSCOPIC HELLER MYOTOMY, DOR FUNDIPLICATION, UPPER ENDOSCOPY;  Surgeon: Odis Hollingshead, MD;  [facility];  Service:  General;  Laterality: N/A;  . HERNIA REPAIR    . HYSTEROSCOPY WITH RESECTOSCOPE N/A 05/03/2015   Procedure: Diagnostic Hysteroscopy;  Surgeon: Lavonia Drafts, MD;  Location: [facility];  Service: Gynecology;  Laterality: N/A;  wants Novasure and myosure.  Marland Kitchen LYSIS OF ADHESION  06/28/2015   Procedure: LYSIS OF ADHESION;  Surgeon: Osborne Oman, MD;  Location: [facility];  Service: Gynecology;;  . OTHER SURGICAL HISTORY     biopsy axillary ,right arm  . right arm biopsy    . UPPER GI ENDOSCOPY N/A 10/08/2013   Procedure: UPPER GI ENDOSCOPY;  Surgeon: Odis Hollingshead, MD;  [facility];  Service: General;  Laterality: N/A;  . WISDOM TOOTH EXTRACTION     Social History   Occupational History  . Occupation: House wife  Tobacco Use  . Smoking status: Never Smoker  . Smokeless tobacco: Never Used  Substance and Sexual Activity  . Alcohol use: No  . Drug use: No  . Sexual activity: Not Currently    Partners: Male    Birth control/protection: Surgical    Comment: pt. given condoms

## 2017-10-31 DIAGNOSIS — F411 Generalized anxiety disorder: Secondary | ICD-10-CM | POA: Diagnosis not present

## 2017-10-31 DIAGNOSIS — F410 Panic disorder [episodic paroxysmal anxiety] without agoraphobia: Secondary | ICD-10-CM | POA: Diagnosis not present

## 2017-11-06 ENCOUNTER — Encounter (INDEPENDENT_AMBULATORY_CARE_PROVIDER_SITE_OTHER): Payer: Self-pay | Admitting: Orthopedic Surgery

## 2017-11-06 ENCOUNTER — Ambulatory Visit (INDEPENDENT_AMBULATORY_CARE_PROVIDER_SITE_OTHER): Payer: Medicare Other | Admitting: Orthopedic Surgery

## 2017-11-06 VITALS — Ht 61.0 in | Wt 161.0 lb

## 2017-11-06 DIAGNOSIS — M25571 Pain in right ankle and joints of right foot: Secondary | ICD-10-CM

## 2017-11-06 MED ORDER — HYDROCODONE-ACETAMINOPHEN 5-325 MG PO TABS: 1.0000 | ORAL_TABLET | Freq: Four times a day (QID) | ORAL | 0 refills | Status: DC | PRN

## 2017-11-06 NOTE — Progress Notes (Signed)
Office Visit Note   Patient: Danielle [name]           Date of Birth: 11-19-1970           MRN: [id number] Visit Date: 11/06/2017              Requested by: Campbell Riches, MD [address] [place] [address] [place], [place] [address] PCP: Campbell Riches, MD  Chief Complaint  Patient presents with  . Right Ankle - Routine Post Op    10/16/17 Right ankle arthroscopic debridement.      HPI: Patient is a 47 year old woman who presents 3 weeks status post right ankle arthroscopic debridement.  She states that the hydrocodone has helped she states she has some fullness and tightness anteriorly in her ankle.  She is full weightbearing in a postoperative shoe.  Assessment & Plan: Visit Diagnoses:  1. Pain in joint involving right ankle and foot     Plan: We will set her up for physical therapy to work on range of motion modalities and strengthening of the ankle.  She is given a refill prescription for Vicodin.    Follow-Up Instructions: Return in about 3 weeks (around 11/27/2017).   Ortho Exam  Patient is alert, oriented, no adenopathy, well-dressed, normal affect, normal respiratory effort. Examination she has wrinkling of the skin in her foot she does have decreased range of motion of her ankle she states she has not been moving her ankle.  There is no redness no cellulitis no signs of infection no joint effusion.  Imaging: No results found. No images are attached to the encounter.  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 04/27/2015    @LABSALLVALUES (HGBA1)@  Body mass index is 30.42 kg/m.  Orders:  No orders of the defined types were placed in this encounter.  Meds ordered this encounter  Medications  . HYDROcodone-acetaminophen (NORCO/VICODIN) 5-325 MG tablet    Sig: Take 1 tablet by mouth every 6 (six) hours as needed for moderate pain.    Dispense:  30  tablet    Refill:  0     Procedures: No procedures performed  Clinical Data: No additional findings.  ROS:  All other systems negative, except as noted in the HPI. Review of Systems  Objective: Vital Signs: Ht 5\' 1"  (1.549 m)   Wt 161 lb (73 kg)   LMP  (LMP Unknown)   BMI 30.42 kg/m   Specialty Comments:  No specialty comments available.  PMFS History: Patient Active Problem List   Diagnosis Date Noted  . Impingement syndrome of right ankle   . Sprain of calcaneofibular ligament of right ankle 07/04/2017  . Ankle pain, right 06/24/2017  . Hyperglycemia 02/18/2017  . Hepatitis B immune 02/18/2017  . 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 S/P total abdominal hysterectomy 06/28/2015  . Abnormal uterine bleeding (AUB) 05/03/2015  . Condyloma acuminatum 02/23/2015  . Fibroids 01/05/2015  . Urinary incontinence 01/05/2015  . Abnormal finding on GI tract imaging 10/12/2013  . Anemia-chronic 10/09/2013  . Achalasia s/p laparoscopic Heller myotomy and Dor fundoplication 19/14/7829  . Ganglion cyst of finger of right hand 07/06/2013  . Trichomoniasis of vagina 06/03/2013  . GERD (gastroesophageal reflux disease) 03/16/2013  . Depression 02/10/2009  . Human immunodeficiency virus (HIV) disease ([place]) 03/24/2007  . Anxiety state 03/24/2007  . ASTHMA 03/24/2007   Past Medical History:  Diagnosis Date  . Anemia   . Anxiety   . Asthma   . Bulging lumbar disc   . Chronic back pain   . Depression   . Genital HSV   . GERD (gastroesophageal reflux disease)   . Hepatitis    States she got a shot for this  . HIV infection ([place])   . Migraines   . Pneumonia   . Pre-diabetes     Family History  Problem Relation Age of Onset  . Hyperlipidemia Mother   . Diabetes Mother   . Stroke Mother   . Diabetes Brother   . Colon cancer Father   . Esophageal cancer Neg Hx   . Rectal cancer Neg Hx   . Stomach cancer Neg Hx     Past Surgical History:  Procedure Laterality Date  .  ABDOMINAL HYSTERECTOMY N/A 06/28/2015   Procedure: HYSTERECTOMY ABDOMINAL;  Surgeon: Osborne Oman, MD;  Location: [facility];  Service: Gynecology;  Laterality: N/A;  Requested 06/28/15 @ 7:30a  . ANKLE ARTHROSCOPY Right 10/16/2017   Procedure: RIGHT ANKLE ARTHROSCOPIC DEBRIDEMENT;  Surgeon: Newt Minion, MD;  Location: [facility];  Service: Orthopedics;  Laterality: Right;  . BILATERAL SALPINGECTOMY Bilateral 06/28/2015   Procedure: BILATERAL SALPINGECTOMY;  Surgeon: Osborne Oman, MD;  Location: [facility];  Service: Gynecology;  Laterality: Bilateral;  . CESAREAN SECTION     twice;  vertical incision x 2  . COLONOSCOPY N/A 10/12/2013   Procedure: COLONOSCOPY;  Surgeon: Jerene Bears, MD;  [facility];  Service: Endoscopy;  Laterality: N/A;  . ESOPHAGEAL MANOMETRY N/A 06/29/2013   Procedure: ESOPHAGEAL MANOMETRY (EM);  Surgeon: Jerene Bears, MD;  [facility];  Service: Gastroenterology;  Laterality: N/A;  . HELLER MYOTOMY N/A 10/08/2013   Procedure: LAPAROSCOPIC HELLER MYOTOMY, DOR FUNDIPLICATION, UPPER ENDOSCOPY;  Surgeon: Odis Hollingshead, MD;  [facility];  Service: General;  Laterality: N/A;  . HERNIA REPAIR    . HYSTEROSCOPY WITH RESECTOSCOPE N/A 05/03/2015   Procedure: Diagnostic Hysteroscopy;  Surgeon: Lavonia Drafts, MD;  Location: [facility];  Service: Gynecology;  Laterality: N/A;  wants Novasure and myosure.  Marland Kitchen LYSIS OF ADHESION  06/28/2015   Procedure: LYSIS OF ADHESION;  Surgeon: Osborne Oman, MD;  Location: [facility];  Service: Gynecology;;  . OTHER SURGICAL HISTORY     biopsy axillary ,right arm  . right arm biopsy    . UPPER GI ENDOSCOPY N/A 10/08/2013   Procedure: UPPER GI ENDOSCOPY;  Surgeon: Odis Hollingshead, MD;  [facility];  Service: General;  Laterality: N/A;  . WISDOM TOOTH EXTRACTION     Social History   Occupational History  . Occupation: House wife  Tobacco Use  . Smoking status: Never Smoker  . Smokeless tobacco: Never Used    Substance and Sexual Activity  . Alcohol use: No  . Drug use: No  . Sexual activity: Not Currently    Partners: Male    Birth control/protection: Surgical    Comment: pt. given condoms

## 2017-11-14 DIAGNOSIS — F410 Panic disorder [episodic paroxysmal anxiety] without agoraphobia: Secondary | ICD-10-CM | POA: Diagnosis not present

## 2017-11-14 DIAGNOSIS — F411 Generalized anxiety disorder: Secondary | ICD-10-CM | POA: Diagnosis not present

## 2017-11-20 ENCOUNTER — Telehealth (INDEPENDENT_AMBULATORY_CARE_PROVIDER_SITE_OTHER): Payer: Self-pay | Admitting: *Deleted

## 2017-11-20 NOTE — Telephone Encounter (Signed)
Received fax from PT and [facility] advising of appt scheduled on 11/28/17 at 10am, per fax pt is aware of appt

## 2017-11-27 ENCOUNTER — Ambulatory Visit (INDEPENDENT_AMBULATORY_CARE_PROVIDER_SITE_OTHER): Payer: Medicare Other | Admitting: Orthopedic Surgery

## 2017-11-27 ENCOUNTER — Encounter (INDEPENDENT_AMBULATORY_CARE_PROVIDER_SITE_OTHER): Payer: Self-pay | Admitting: Orthopedic Surgery

## 2017-11-27 VITALS — Ht 61.0 in | Wt 161.0 lb

## 2017-11-27 DIAGNOSIS — M25871 Other specified joint disorders, right ankle and foot: Secondary | ICD-10-CM

## 2017-11-27 NOTE — Progress Notes (Signed)
Office Visit Note   Patient: Danielle [name]           Date of Birth: March 04, 1971           MRN: [id number] Visit Date: 11/27/2017              Requested by: Campbell Riches, MD [address] [place], [place] [address] PCP: Campbell Riches, MD  Chief Complaint  Patient presents with  . Right Ankle - Routine Post Op    10/19/17 right ankle scope and debridement       HPI: Patient is a 47 year old woman who presents 6 weeks status post right ankle arthroscopy with debridement.  Patient states that she has been using her postoperative shoes she states it is too painful to wear a sneaker.  Patient states that the therapy has been well number and she does have a first appointment with physical therapy this week.  Patient states that she almost fell going down the stairs and she caught herself and did not fall.  Patient denies any swelling.  Assessment & Plan: Visit Diagnoses:  1. Impingement of right ankle joint     Plan: Continue working on range of motion advance the shoe wear as she feels comfortable.  Work with physical therapy.  Follow-Up Instructions: Return in about 3 weeks (around 12/18/2017).   Ortho Exam  Patient is alert, oriented, no adenopathy, well-dressed, normal affect, normal respiratory effort. Examination patient has no pain with passive range of motion of the right ankle she has a good pulse.  There is no swelling the portals are clean and dry.  Imaging: No results found. No images are attached to the encounter.  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 04/27/2015    @LABSALLVALUES (HGBA1)@  Body mass index is 30.42 kg/m.  Orders:  No orders of the defined types were placed in this encounter.  No orders of the defined types were placed in this encounter.    Procedures: No procedures performed  Clinical Data: No additional  findings.  ROS:  All other systems negative, except as noted in the HPI. Review of Systems  Objective: Vital Signs: Ht 5\' 1"  (1.549 m)   Wt 161 lb (73 kg)   LMP  (LMP Unknown)   BMI 30.42 kg/m   Specialty Comments:  No specialty comments available.  PMFS History: Patient Active Problem List   Diagnosis Date Noted  . Impingement syndrome of right ankle   . Sprain of calcaneofibular ligament of right ankle 07/04/2017  . Ankle pain, right 06/24/2017  . Hyperglycemia 02/18/2017  . Hepatitis B immune 02/18/2017  . 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 S/P total abdominal hysterectomy 06/28/2015  . Abnormal uterine bleeding (AUB) 05/03/2015  . Condyloma acuminatum 02/23/2015  . Fibroids 01/05/2015  . Urinary incontinence 01/05/2015  . Abnormal finding on GI tract imaging 10/12/2013  . Anemia-chronic 10/09/2013  . Achalasia s/p laparoscopic Heller myotomy and Dor fundoplication 19/14/7829  . Ganglion cyst of finger of right hand 07/06/2013  . Trichomoniasis of vagina 06/03/2013  . GERD (gastroesophageal reflux disease) 03/16/2013  . Depression 02/10/2009  . Human immunodeficiency virus (HIV) disease ([place]) 03/24/2007  . Anxiety state 03/24/2007  . ASTHMA 03/24/2007   Past Medical History:  Diagnosis Date  . Anemia   . Anxiety   . Asthma   . Bulging lumbar disc   .  Chronic back pain   . Depression   . Genital HSV   . GERD (gastroesophageal reflux disease)   . Hepatitis    States she got a shot for this  . HIV infection ([place])   . Migraines   . Pneumonia   . Pre-diabetes     Family History  Problem Relation Age of Onset  . Hyperlipidemia Mother   . Diabetes Mother   . Stroke Mother   . Diabetes Brother   . Colon cancer Father   . Esophageal cancer Neg Hx   . Rectal cancer Neg Hx   . Stomach cancer Neg Hx     Past Surgical History:  Procedure Laterality Date  . ABDOMINAL HYSTERECTOMY N/A 06/28/2015   Procedure: HYSTERECTOMY ABDOMINAL;  Surgeon: Osborne Oman, MD;  Location: [facility];  Service: Gynecology;  Laterality: N/A;  Requested 06/28/15 @ 7:30a  . ANKLE ARTHROSCOPY Right 10/16/2017   Procedure: RIGHT ANKLE ARTHROSCOPIC DEBRIDEMENT;  Surgeon: Newt Minion, MD;  Location: [facility];  Service: Orthopedics;  Laterality: Right;  . BILATERAL SALPINGECTOMY Bilateral 06/28/2015   Procedure: BILATERAL SALPINGECTOMY;  Surgeon: Osborne Oman, MD;  Location: [facility];  Service: Gynecology;  Laterality: Bilateral;  . CESAREAN SECTION     twice;  vertical incision x 2  . COLONOSCOPY N/A 10/12/2013   Procedure: COLONOSCOPY;  Surgeon: Jerene Bears, MD;  [facility];  Service: Endoscopy;  Laterality: N/A;  . ESOPHAGEAL MANOMETRY N/A 06/29/2013   Procedure: ESOPHAGEAL MANOMETRY (EM);  Surgeon: Jerene Bears, MD;  [facility];  Service: Gastroenterology;  Laterality: N/A;  . HELLER MYOTOMY N/A 10/08/2013   Procedure: LAPAROSCOPIC HELLER MYOTOMY, DOR FUNDIPLICATION, UPPER ENDOSCOPY;  Surgeon: Odis Hollingshead, MD;  [facility];  Service: General;  Laterality: N/A;  . HERNIA REPAIR    . HYSTEROSCOPY WITH RESECTOSCOPE N/A 05/03/2015   Procedure: Diagnostic Hysteroscopy;  Surgeon: Lavonia Drafts, MD;  Location: [facility];  Service: Gynecology;  Laterality: N/A;  wants Novasure and myosure.  Marland Kitchen LYSIS OF ADHESION  06/28/2015   Procedure: LYSIS OF ADHESION;  Surgeon: Osborne Oman, MD;  Location: [facility];  Service: Gynecology;;  . OTHER SURGICAL HISTORY     biopsy axillary ,right arm  . right arm biopsy    . UPPER GI ENDOSCOPY N/A 10/08/2013   Procedure: UPPER GI ENDOSCOPY;  Surgeon: Odis Hollingshead, MD;  [facility];  Service: General;  Laterality: N/A;  . WISDOM TOOTH EXTRACTION     Social History   Occupational History  . Occupation: House wife  Tobacco Use  . Smoking status: Never Smoker  . Smokeless tobacco: Never Used  Substance and Sexual Activity  . Alcohol use: No  . Drug use: No  . Sexual activity: Not  Currently    Partners: Male    Birth control/protection: Surgical    Comment: pt. given condoms

## 2017-11-28 ENCOUNTER — Other Ambulatory Visit (INDEPENDENT_AMBULATORY_CARE_PROVIDER_SITE_OTHER): Payer: Self-pay

## 2017-11-28 ENCOUNTER — Telehealth (INDEPENDENT_AMBULATORY_CARE_PROVIDER_SITE_OTHER): Payer: Self-pay

## 2017-11-28 DIAGNOSIS — M25571 Pain in right ankle and joints of right foot: Secondary | ICD-10-CM | POA: Diagnosis not present

## 2017-11-28 DIAGNOSIS — R262 Difficulty in walking, not elsewhere classified: Secondary | ICD-10-CM | POA: Diagnosis not present

## 2017-11-28 DIAGNOSIS — M6281 Muscle weakness (generalized): Secondary | ICD-10-CM | POA: Diagnosis not present

## 2017-11-28 DIAGNOSIS — F410 Panic disorder [episodic paroxysmal anxiety] without agoraphobia: Secondary | ICD-10-CM | POA: Diagnosis not present

## 2017-11-28 DIAGNOSIS — F411 Generalized anxiety disorder: Secondary | ICD-10-CM | POA: Diagnosis not present

## 2017-11-28 DIAGNOSIS — M25671 Stiffness of right ankle, not elsewhere classified: Secondary | ICD-10-CM | POA: Diagnosis not present

## 2017-11-28 NOTE — Telephone Encounter (Signed)
Danielle [name] with [facility] needs Rx faxed for physical therapy at [phone].  Cb# is [phone].  Please advise.  Thank you.

## 2017-11-28 NOTE — Telephone Encounter (Signed)
Order written and faxed.

## 2017-11-29 ENCOUNTER — Encounter (HOSPITAL_COMMUNITY): Payer: Self-pay | Admitting: Emergency Medicine

## 2017-11-29 ENCOUNTER — Ambulatory Visit (HOSPITAL_COMMUNITY)
Admission: EM | Admit: 2017-11-29 | Discharge: 2017-11-29 | Disposition: A | Discharge status: Home / Self Care | Payer: Medicare Other | Attending: Emergency Medicine | Admitting: Emergency Medicine

## 2017-11-29 ENCOUNTER — Other Ambulatory Visit: Payer: Self-pay

## 2017-11-29 DIAGNOSIS — G43009 Migraine without aura, not intractable, without status migrainosus: Secondary | ICD-10-CM

## 2017-11-29 MED ORDER — ACETAMINOPHEN 325 MG PO TABS
ORAL_TABLET | ORAL | Status: AC
  Filled 2017-11-29: qty 3

## 2017-11-29 MED ORDER — KETOROLAC TROMETHAMINE 60 MG/2ML IM SOLN
INTRAMUSCULAR | Status: AC
  Filled 2017-11-29: qty 2

## 2017-11-29 MED ORDER — DEXAMETHASONE SODIUM PHOSPHATE 10 MG/ML IJ SOLN
INTRAMUSCULAR | Status: AC
  Filled 2017-11-29: qty 1

## 2017-11-29 MED ORDER — KETOROLAC TROMETHAMINE 30 MG/ML IJ SOLN
30.0000 mg | Freq: Once | INTRAMUSCULAR | Status: AC
  Administered 2017-11-29: 30 mg via INTRAMUSCULAR

## 2017-11-29 MED ORDER — BUTALBITAL-APAP-CAFFEINE 50-325-40 MG PO TABS: 1.0000 | ORAL_TABLET | ORAL | 0 refills | Status: DC | PRN

## 2017-11-29 MED ORDER — DEXAMETHASONE SODIUM PHOSPHATE 10 MG/ML IJ SOLN
10.0000 mg | Freq: Once | INTRAMUSCULAR | Status: AC
  Administered 2017-11-29: 10 mg via INTRAMUSCULAR

## 2017-11-29 MED ORDER — ACETAMINOPHEN 500 MG PO TABS
1000.0000 mg | ORAL_TABLET | Freq: Once | ORAL | Status: AC
  Administered 2017-11-29: 975 mg via ORAL

## 2017-11-29 MED ORDER — IBUPROFEN 600 MG PO TABS: 600.0000 mg | ORAL_TABLET | Freq: Four times a day (QID) | ORAL | 0 refills | Status: DC | PRN

## 2017-11-29 NOTE — ED Provider Notes (Signed)
HPI  SUBJECTIVE:  Danielle [name] is a 47 y.o. female who reports gradual onset Frontal HA behind eyes since yesterday. Got worse today. Intermittent, lasts hours. Better with rest and being in a dark room. Worse with light, noise, head movement. Reports photophobia, phonophobia,  No fevers, N/V,  visual changes, nasal congestion, sinus pain/pressure, ear pain,  jaw pain, purulent nasal d/c, dental pain,  dysarthria, focal weakness, facial droop, discoordination. No body aches, neck stiffness, rash. No seizures, syncope.   Pt is not pregnant. No sudden onset, did not occur during exertion. Was triggered by stress, which is a known trigger.  States is similar to previous migraines. Has tried triptan w/o relief. Past medical history of HIV, migraines, asthma.  negative for aneurysm, sinusitis, glaucoma, stroke, atrial fibrillation or temporal arteritis. Pt is not on any antiplatelets/anticoagulants. PMD: Dr. Juluis Mire    Past Medical History:  Diagnosis Date  . Anemia   . Anxiety   . Asthma   . Bulging lumbar disc   . Chronic back pain   . Depression   . Genital HSV   . GERD (gastroesophageal reflux disease)   . Hepatitis    States she got a shot for this  . HIV infection ([facility])   . Migraines   . Pneumonia   . Pre-diabetes     Past Surgical History:  Procedure Laterality Date  . ABDOMINAL HYSTERECTOMY N/A 06/28/2015   Procedure: HYSTERECTOMY ABDOMINAL;  Surgeon: Osborne Oman, MD;  Location: [facility];  Service: Gynecology;  Laterality: N/A;  Requested 06/28/15 @ 7:30a  . ANKLE ARTHROSCOPY Right 10/16/2017   Procedure: RIGHT ANKLE ARTHROSCOPIC DEBRIDEMENT;  Surgeon: Newt Minion, MD;  Location: [facility];  Service: Orthopedics;  Laterality: Right;  . BILATERAL SALPINGECTOMY Bilateral 06/28/2015   Procedure: BILATERAL SALPINGECTOMY;  Surgeon: Osborne Oman, MD;  Location: [facility];  Service: Gynecology;  Laterality: Bilateral;  . CESAREAN SECTION     twice;  vertical incision x  2  . COLONOSCOPY N/A 10/12/2013   Procedure: COLONOSCOPY;  Surgeon: Jerene Bears, MD;  [facility];  Service: Endoscopy;  Laterality: N/A;  . ESOPHAGEAL MANOMETRY N/A 06/29/2013   Procedure: ESOPHAGEAL MANOMETRY (EM);  Surgeon: Jerene Bears, MD;  [facility];  Service: Gastroenterology;  Laterality: N/A;  . HELLER MYOTOMY N/A 10/08/2013   Procedure: LAPAROSCOPIC HELLER MYOTOMY, DOR FUNDIPLICATION, UPPER ENDOSCOPY;  Surgeon: Odis Hollingshead, MD;  [facility];  Service: General;  Laterality: N/A;  . HERNIA REPAIR    . HYSTEROSCOPY WITH RESECTOSCOPE N/A 05/03/2015   Procedure: Diagnostic Hysteroscopy;  Surgeon: Lavonia Drafts, MD;  Location: [facility];  Service: Gynecology;  Laterality: N/A;  wants Novasure and myosure.  Marland Kitchen LYSIS OF ADHESION  06/28/2015   Procedure: LYSIS OF ADHESION;  Surgeon: Osborne Oman, MD;  Location: [facility];  Service: Gynecology;;  . OTHER SURGICAL HISTORY     biopsy axillary ,right arm  . right arm biopsy    . UPPER GI ENDOSCOPY N/A 10/08/2013   Procedure: UPPER GI ENDOSCOPY;  Surgeon: Odis Hollingshead, MD;  [facility];  Service: General;  Laterality: N/A;  . WISDOM TOOTH EXTRACTION      Family History  Problem Relation Age of Onset  . Hyperlipidemia Mother   . Diabetes Mother   . Stroke Mother   . Diabetes Brother   . Colon cancer Father   . Esophageal cancer Neg Hx   . Rectal cancer Neg Hx   . Stomach cancer  Neg Hx     Social History   Tobacco Use  . Smoking status: Never Smoker  . Smokeless tobacco: Never Used  Substance Use Topics  . Alcohol use: No  . Drug use: No    No current facility-administered medications for this encounter.   Current Outpatient Medications:  .  butalbital-acetaminophen-caffeine (FIORICET, ESGIC) 50-325-40 MG tablet, Take 1-2 tablets by mouth every 4 (four) hours as needed for headache. Max 6 caps/day, Disp: 20 tablet, Rfl: 0 .  cyclobenzaprine (FLEXERIL) 10 MG tablet, Take 10 mg by  mouth 3 (three) times daily as needed for muscle spasms., Disp: , Rfl:  .  DESCOVY 200-25 MG tablet, TAKE 1 TABLET BY MOUTH DAILY, Disp: 30 tablet, Rfl: 5 .  DULoxetine (CYMBALTA) 30 MG capsule, Take 30 mg by mouth at bedtime., Disp: , Rfl:  .  ibuprofen (ADVIL,MOTRIN) 600 MG tablet, Take 1 tablet (600 mg total) by mouth every 6 (six) hours as needed., Disp: 30 tablet, Rfl: 0 .  oxybutynin (DITROPAN-XL) 10 MG 24 hr tablet, Take 10 mg by mouth daily., Disp: , Rfl:  .  PROAIR HFA 108 (90 BASE) MCG/ACT inhaler, Inhale 2 puffs into the lungs every 4 (four) hours as needed for wheezing or shortness of breath. Shortness of breath/wheezing, Disp: , Rfl: 0 .  PULMICORT FLEXHALER 90 MCG/ACT inhaler, Inhale 2 puffs by mouth twice daily, Disp: , Rfl: 3 .  ranitidine (ZANTAC) 150 MG tablet, TAKE 1 TABLET BY MOUTH EVERY NIGHT AT BEDTIME(12 HOURS FROM REYETAZ) (Patient taking differently: TAKE 150 MG BY MOUTH EVERY NIGHT AT BEDTIME(12 HOURS FROM REYETAZ)), Disp: 90 tablet, Rfl: 3 .  SUMAtriptan (IMITREX) 100 MG tablet, Take 1 tablet (100 mg total) by mouth every 2 (two) hours as needed for migraine. May repeat in 2 hours if headache persists or recurs., Disp: 10 tablet, Rfl: 0 .  TIVICAY 50 MG tablet, TAKE 1 TABLET BY MOUTH EVERY EVENING (Patient taking differently: TAKE 50 MG BY MOUTH EVERY EVENING), Disp: 30 tablet, Rfl: 3 .  valACYclovir (VALTREX) 500 MG tablet, TAKE 1 TABLET(500 MG) BY MOUTH DAILY, Disp: 30 tablet, Rfl: 5  Allergies  Allergen Reactions  . Latex Rash     ROS  As noted in HPI.   Physical Exam  BP 121/75 (BP Location: Left Arm)   Pulse 77   Temp 99 F (37.2 C) (Oral)   Resp 16   LMP  (LMP Unknown)   SpO2 100%   Constitutional: Well developed, well nourished, no acute distress Eyes: PERRL, EOMI, conjunctiva normal bilaterally.  She is unable to tolerate fundoscopic  HENT: Normocephalic, atraumatic,mucus membranes moist, normal dentition.  TM normal b/l. No TMJ tenderness. Normal  dentition. No nasal congestion, no sinus tenderness. No temporal artery tenderness.  Neck: no cervical LN + mild trapezial muscle tenderness. No meningismus Respiratory: normal inspiratory effort Cardiovascular: Normal rate GI:  nondistended skin: No rash, skin intact Musculoskeletal: No edema, no tenderness, no deformities Neurologic: Alert & oriented x 3, CN II-XII intact, romberg neg, finger-> nose, heel-> shin equal b/l, Romberg neg, unable to do tandem gait due to ankle pain-is wearing a postop shoe and Ace wrap Psychiatric: Speech and behavior appropriate   ED Course  Medications  ketorolac (TORADOL) 30 MG/ML injection 30 mg (30 mg Intramuscular Given 11/29/17 1527)  dexamethasone (DECADRON) injection 10 mg (10 mg Intramuscular Given 11/29/17 1527)  acetaminophen (TYLENOL) tablet 1,000 mg (975 mg Oral Given 11/29/17 1529)    No orders of the defined types were  placed in this encounter.  No results found for this or any previous visit (from the past 24 hour(s)). No results found.   ED Clinical Impression  Migraine without aura and without status migrainosus, not intractable  ED Assessment/Plan  Pt describing typical pain, no sudden onset. Doubt SAH, ICH or space occupying lesion. Pt without fevers/chills, Pt has no meningeal sx, no nuchal rigidity. Doubt meningitis. Pt with normal neuro exam, no evidence of CVA/TIA.  Pt BP not elevated significantly, doubt hypertensive emergency. No evidence of temporal artery tenderness, no evidence of glaucoma or other ocular pathology. Will give headache cocktail (dexamethasone 10 IM,toradol 30 IM, Tylenol 1 g), and reassess.  Patient has no nausea, deferred antiemetic.  She also drove here, withholding Benadryl.  [place] controlled substances registry for this patient consulted and feel the risk/benefit ratio today is favorable for proceeding with this prescription for a controlled substance.  Patient had a short course of Norco filled  on 11/19/2017 from her orthopedic surgeon.  Pt much improved after medications. Pt with continued non-focal neuro exam. Will d/c home with nsaid, fioricet, and have pt F/U with PCP. Discussed labs, imaging, MDM, plan for follow up, signs and sx that should prompt return to ER. Pt agrees with plan  Meds ordered this encounter  Medications  . ketorolac (TORADOL) 30 MG/ML injection 30 mg  . dexamethasone (DECADRON) injection 10 mg  . acetaminophen (TYLENOL) tablet 1,000 mg  . ibuprofen (ADVIL,MOTRIN) 600 MG tablet    Sig: Take 1 tablet (600 mg total) by mouth every 6 (six) hours as needed.    Dispense:  30 tablet    Refill:  0  . butalbital-acetaminophen-caffeine (FIORICET, ESGIC) 50-325-40 MG tablet    Sig: Take 1-2 tablets by mouth every 4 (four) hours as needed for headache. Max 6 caps/day    Dispense:  20 tablet    Refill:  0    *This clinic note was created using [employer]. Therefore, there may be occasional mistakes despite careful proofreading.  ?   Melynda Ripple, MD 11/29/17 [phone]

## 2017-11-29 NOTE — Discharge Instructions (Addendum)
600 mg of ibuprofen take with 1 g of Tylenol 3-4 times a day as needed for headache.  Fioricet for severe pain only.

## 2017-11-29 NOTE — ED Triage Notes (Signed)
The patient presented to the [facility] with a complaint of a headache x 1 day. The patient reported a hx of migraines. The patient reported that her PCP has given her sumatriptan but it has not helped.

## 2017-12-03 DIAGNOSIS — M6281 Muscle weakness (generalized): Secondary | ICD-10-CM | POA: Diagnosis not present

## 2017-12-03 DIAGNOSIS — M25671 Stiffness of right ankle, not elsewhere classified: Secondary | ICD-10-CM | POA: Diagnosis not present

## 2017-12-03 DIAGNOSIS — M25571 Pain in right ankle and joints of right foot: Secondary | ICD-10-CM | POA: Diagnosis not present

## 2017-12-03 DIAGNOSIS — R262 Difficulty in walking, not elsewhere classified: Secondary | ICD-10-CM | POA: Diagnosis not present

## 2017-12-04 DIAGNOSIS — E669 Obesity, unspecified: Secondary | ICD-10-CM | POA: Diagnosis not present

## 2017-12-04 DIAGNOSIS — G43909 Migraine, unspecified, not intractable, without status migrainosus: Secondary | ICD-10-CM | POA: Diagnosis not present

## 2017-12-04 DIAGNOSIS — R262 Difficulty in walking, not elsewhere classified: Secondary | ICD-10-CM | POA: Diagnosis not present

## 2017-12-04 DIAGNOSIS — M25671 Stiffness of right ankle, not elsewhere classified: Secondary | ICD-10-CM | POA: Diagnosis not present

## 2017-12-04 DIAGNOSIS — Z713 Dietary counseling and surveillance: Secondary | ICD-10-CM | POA: Diagnosis not present

## 2017-12-04 DIAGNOSIS — M25571 Pain in right ankle and joints of right foot: Secondary | ICD-10-CM | POA: Diagnosis not present

## 2017-12-04 DIAGNOSIS — M6281 Muscle weakness (generalized): Secondary | ICD-10-CM | POA: Diagnosis not present

## 2017-12-04 DIAGNOSIS — R079 Chest pain, unspecified: Secondary | ICD-10-CM | POA: Diagnosis not present

## 2017-12-06 DIAGNOSIS — R262 Difficulty in walking, not elsewhere classified: Secondary | ICD-10-CM | POA: Diagnosis not present

## 2017-12-06 DIAGNOSIS — M25671 Stiffness of right ankle, not elsewhere classified: Secondary | ICD-10-CM | POA: Diagnosis not present

## 2017-12-06 DIAGNOSIS — M6281 Muscle weakness (generalized): Secondary | ICD-10-CM | POA: Diagnosis not present

## 2017-12-06 DIAGNOSIS — M25571 Pain in right ankle and joints of right foot: Secondary | ICD-10-CM | POA: Diagnosis not present

## 2017-12-09 DIAGNOSIS — M25671 Stiffness of right ankle, not elsewhere classified: Secondary | ICD-10-CM | POA: Diagnosis not present

## 2017-12-09 DIAGNOSIS — M6281 Muscle weakness (generalized): Secondary | ICD-10-CM | POA: Diagnosis not present

## 2017-12-09 DIAGNOSIS — R262 Difficulty in walking, not elsewhere classified: Secondary | ICD-10-CM | POA: Diagnosis not present

## 2017-12-09 DIAGNOSIS — M25571 Pain in right ankle and joints of right foot: Secondary | ICD-10-CM | POA: Diagnosis not present

## 2017-12-16 DIAGNOSIS — M25671 Stiffness of right ankle, not elsewhere classified: Secondary | ICD-10-CM | POA: Diagnosis not present

## 2017-12-16 DIAGNOSIS — R262 Difficulty in walking, not elsewhere classified: Secondary | ICD-10-CM | POA: Diagnosis not present

## 2017-12-16 DIAGNOSIS — M25571 Pain in right ankle and joints of right foot: Secondary | ICD-10-CM | POA: Diagnosis not present

## 2017-12-16 DIAGNOSIS — M6281 Muscle weakness (generalized): Secondary | ICD-10-CM | POA: Diagnosis not present

## 2017-12-18 ENCOUNTER — Ambulatory Visit (INDEPENDENT_AMBULATORY_CARE_PROVIDER_SITE_OTHER): Payer: Medicare Other | Admitting: Orthopedic Surgery

## 2017-12-18 ENCOUNTER — Other Ambulatory Visit: Payer: Self-pay | Admitting: Infectious Diseases

## 2017-12-18 DIAGNOSIS — M25571 Pain in right ankle and joints of right foot: Secondary | ICD-10-CM | POA: Diagnosis not present

## 2017-12-18 DIAGNOSIS — B20 Human immunodeficiency virus [HIV] disease: Secondary | ICD-10-CM

## 2017-12-18 DIAGNOSIS — M6281 Muscle weakness (generalized): Secondary | ICD-10-CM | POA: Diagnosis not present

## 2017-12-18 DIAGNOSIS — R262 Difficulty in walking, not elsewhere classified: Secondary | ICD-10-CM | POA: Diagnosis not present

## 2017-12-18 DIAGNOSIS — M25671 Stiffness of right ankle, not elsewhere classified: Secondary | ICD-10-CM | POA: Diagnosis not present

## 2017-12-18 MED ORDER — EMTRICITABINE-TENOFOVIR AF 200-25 MG PO TABS: 1.0000 | ORAL_TABLET | Freq: Every day | ORAL | 5 refills | Status: DC

## 2017-12-20 DIAGNOSIS — R262 Difficulty in walking, not elsewhere classified: Secondary | ICD-10-CM | POA: Diagnosis not present

## 2017-12-20 DIAGNOSIS — M6281 Muscle weakness (generalized): Secondary | ICD-10-CM | POA: Diagnosis not present

## 2017-12-20 DIAGNOSIS — F410 Panic disorder [episodic paroxysmal anxiety] without agoraphobia: Secondary | ICD-10-CM | POA: Diagnosis not present

## 2017-12-20 DIAGNOSIS — M25671 Stiffness of right ankle, not elsewhere classified: Secondary | ICD-10-CM | POA: Diagnosis not present

## 2017-12-20 DIAGNOSIS — M25571 Pain in right ankle and joints of right foot: Secondary | ICD-10-CM | POA: Diagnosis not present

## 2017-12-20 DIAGNOSIS — F411 Generalized anxiety disorder: Secondary | ICD-10-CM | POA: Diagnosis not present

## 2017-12-23 ENCOUNTER — Ambulatory Visit (INDEPENDENT_AMBULATORY_CARE_PROVIDER_SITE_OTHER): Payer: Medicare Other | Admitting: Orthopedic Surgery

## 2017-12-23 ENCOUNTER — Ambulatory Visit (INDEPENDENT_AMBULATORY_CARE_PROVIDER_SITE_OTHER): Payer: Medicare Other | Admitting: Infectious Diseases

## 2017-12-23 ENCOUNTER — Other Ambulatory Visit: Payer: Self-pay | Admitting: Pharmacist

## 2017-12-23 ENCOUNTER — Encounter (INDEPENDENT_AMBULATORY_CARE_PROVIDER_SITE_OTHER): Payer: Self-pay | Admitting: Orthopedic Surgery

## 2017-12-23 ENCOUNTER — Encounter: Payer: Self-pay | Admitting: Infectious Diseases

## 2017-12-23 ENCOUNTER — Other Ambulatory Visit (HOSPITAL_COMMUNITY)
Admission: RE | Admit: 2017-12-23 | Discharge: 2017-12-23 | Disposition: A | Discharge status: Home / Self Care | Payer: Medicare Other | Source: Ambulatory Visit | Attending: Infectious Diseases | Admitting: Infectious Diseases

## 2017-12-23 VITALS — BP 108/72 | HR 116 | Temp 99.3°F | Wt 173.0 lb

## 2017-12-23 DIAGNOSIS — B20 Human immunodeficiency virus [HIV] disease: Secondary | ICD-10-CM | POA: Diagnosis not present

## 2017-12-23 DIAGNOSIS — K219 Gastro-esophageal reflux disease without esophagitis: Secondary | ICD-10-CM | POA: Diagnosis not present

## 2017-12-23 DIAGNOSIS — M25871 Other specified joint disorders, right ankle and foot: Secondary | ICD-10-CM

## 2017-12-23 DIAGNOSIS — Z79899 Other long term (current) drug therapy: Secondary | ICD-10-CM

## 2017-12-23 DIAGNOSIS — S93411A Sprain of calcaneofibular ligament of right ankle, initial encounter: Secondary | ICD-10-CM | POA: Diagnosis not present

## 2017-12-23 DIAGNOSIS — Z113 Encounter for screening for infections with a predominantly sexual mode of transmission: Secondary | ICD-10-CM | POA: Insufficient documentation

## 2017-12-23 MED ORDER — OMEPRAZOLE 20 MG PO CPDR: 20.0000 mg | DELAYED_RELEASE_CAPSULE | Freq: Every day | ORAL | 11 refills | Status: DC

## 2017-12-23 MED ORDER — BICTEGRAVIR-EMTRICITAB-TENOFOV 50-200-25 MG PO TABS: 1.0000 | ORAL_TABLET | Freq: Every day | ORAL | 3 refills | Status: DC

## 2017-12-23 NOTE — Assessment & Plan Note (Signed)
She is doing well Will check her labs today Offered/refused condoms.  Will change her to biktarvy.  Rtc in 6 months.

## 2017-12-23 NOTE — Progress Notes (Signed)
   Subjective:    Patient ID: Danielle [name], female    DOB: [birth year], 47 y.o.   MRN: [id number]  HPI 47yo FHIV+.  Additionally, she has a history of GERD, bipolar, R hand ganglion cyst removed 08-2016,and abnormal uterine bleed and hysterectomy.  Had prev mammogram 07-2015 that showed fibro-adenomas. Patient was previously on reyetaz/norvir/truvada then swtiched to tivicay-descovy. At her previous visit, was complaining of ankle pain. She has been wearing a boot since then. She had MRI in October that was negative. She had surgery for torn ligament 10-2017.   Has rx of imitrex which has helped her headaches.   Will need f/u mammo 03-2018 for fibro-adenoma.   PAP- last 02-2017  Husband is on truvada, is (-).   HIV 1 RNA Quant (copies/mL)  Date Value  01/17/2017 <20 NOT DETECTED  06/13/2016 <20  11/30/2015 <20   CD4 T Cell Abs (/uL)  Date Value  01/17/2017 710  06/13/2016 590  11/30/2015 630    Review of Systems  Constitutional: Negative for appetite change and unexpected weight change.  Gastrointestinal: Positive for abdominal pain. Negative for constipation and diarrhea.  Genitourinary: Negative for difficulty urinating.  Musculoskeletal: Positive for arthralgias.  Psychiatric/Behavioral: Positive for sleep disturbance.  reflux woke her up from sleep. Pain is worse with wearing a bra.  Wt up 12# . Please see HPI. All other systems reviewed and negative.     Objective:   Physical Exam  Constitutional: She is oriented to person, place, and time. She appears well-developed and well-nourished.  HENT:  Mouth/Throat: No oropharyngeal exudate.  Eyes: Pupils are equal, round, and reactive to light. EOM are normal.  Neck: Neck supple.  Cardiovascular: Normal rate, regular rhythm and normal heart sounds.  Pulmonary/Chest: Effort normal and breath sounds normal.  Abdominal: Soft. Bowel sounds are normal. There is no tenderness. There is no rebound.  Musculoskeletal:       Legs: Lymphadenopathy:    She has no cervical adenopathy.  Neurological: She is alert and oriented to person, place, and time.  Psychiatric: She has a normal mood and affect.       Assessment & Plan:

## 2017-12-23 NOTE — Assessment & Plan Note (Signed)
She is doing ok, appreciate surgical, PCP f/u.

## 2017-12-23 NOTE — Progress Notes (Signed)
Office Visit Note   Patient: Danielle [name]           Date of Birth: 10/17/1970           MRN: [id number] Visit Date: 12/23/2017              Requested by: Campbell Riches, MD [address] [place], [place] [address] PCP: Campbell Riches, MD  Chief Complaint  Patient presents with  . Right Foot - Pain, Follow-up      HPI: Patient is a 47 year old woman who is status post right ankle arthroscopy for impingement.  Patient is going to physical therapy 3 times a week she states she had increased pain yesterday.  Assessment & Plan: Visit Diagnoses:  1. Impingement of right ankle joint     Plan: Continue with physical therapy continue work on range of motion of her ankle.  She will try to transition from her postoperative shoe to a sneaker.  Her current postoperative shoe was worn out we will get her a new shoe.  Follow-Up Instructions: Return in about 3 weeks (around 01/13/2018).   Ortho Exam  Patient is alert, oriented, no adenopathy, well-dressed, normal affect, normal respiratory effort. Examination she has good pulses she has dorsiflexion to neutral there is no effusion no warmth no redness around the ankle.  She does have tenderness with range of motion of the ankle.  Imaging: No results found. No images are attached to the encounter.  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 04/27/2015    @LABSALLVALUES (HGBA1)@  There is no height or weight on file to calculate BMI.  Orders:  No orders of the defined types were placed in this encounter.  No orders of the defined types were placed in this encounter.    Procedures: No procedures performed  Clinical Data: No additional findings.  ROS:  All other systems negative, except as noted in the HPI. Review of Systems  Objective: Vital Signs: LMP  (LMP Unknown)   Specialty Comments:  No  specialty comments available.  PMFS History: Patient Active Problem List   Diagnosis Date Noted  . Impingement syndrome of right ankle   . Sprain of calcaneofibular ligament of right ankle 07/04/2017  . Ankle pain, right 06/24/2017  . Hyperglycemia 02/18/2017  . Hepatitis B immune 02/18/2017  . 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 S/P total abdominal hysterectomy 06/28/2015  . Abnormal uterine bleeding (AUB) 05/03/2015  . Condyloma acuminatum 02/23/2015  . Fibroids 01/05/2015  . Urinary incontinence 01/05/2015  . Abnormal finding on GI tract imaging 10/12/2013  . Anemia-chronic 10/09/2013  . Achalasia s/p laparoscopic Heller myotomy and Dor fundoplication 30/16/0109  . Ganglion cyst of finger of right hand 07/06/2013  . Trichomoniasis of vagina 06/03/2013  . GERD (gastroesophageal reflux disease) 03/16/2013  . Depression 02/10/2009  . Human immunodeficiency virus (HIV) disease ([place]) 03/24/2007  . Anxiety state 03/24/2007  . ASTHMA 03/24/2007   Past Medical History:  Diagnosis Date  . Anemia   . Anxiety   . Asthma   . Bulging lumbar disc   . Chronic back pain   . Depression   . Genital HSV   . GERD (gastroesophageal reflux disease)   . Hepatitis    States she got a shot for this  . HIV infection ([place])   . Migraines   . Pneumonia   .  Pre-diabetes     Family History  Problem Relation Age of Onset  . Hyperlipidemia Mother   . Diabetes Mother   . Stroke Mother   . Diabetes Brother   . Colon cancer Father   . Esophageal cancer Neg Hx   . Rectal cancer Neg Hx   . Stomach cancer Neg Hx     Past Surgical History:  Procedure Laterality Date  . ABDOMINAL HYSTERECTOMY N/A 06/28/2015   Procedure: HYSTERECTOMY ABDOMINAL;  Surgeon: Osborne Oman, MD;  Location: [facility];  Service: Gynecology;  Laterality: N/A;  Requested 06/28/15 @ 7:30a  . ANKLE ARTHROSCOPY Right 10/16/2017   Procedure: RIGHT ANKLE ARTHROSCOPIC DEBRIDEMENT;  Surgeon: Newt Minion, MD;  Location: [facility];   Service: Orthopedics;  Laterality: Right;  . BILATERAL SALPINGECTOMY Bilateral 06/28/2015   Procedure: BILATERAL SALPINGECTOMY;  Surgeon: Osborne Oman, MD;  Location: [facility];  Service: Gynecology;  Laterality: Bilateral;  . CESAREAN SECTION     twice;  vertical incision x 2  . COLONOSCOPY N/A 10/12/2013   Procedure: COLONOSCOPY;  Surgeon: Jerene Bears, MD;  [facility];  Service: Endoscopy;  Laterality: N/A;  . ESOPHAGEAL MANOMETRY N/A 06/29/2013   Procedure: ESOPHAGEAL MANOMETRY (EM);  Surgeon: Jerene Bears, MD;  [facility];  Service: Gastroenterology;  Laterality: N/A;  . HELLER MYOTOMY N/A 10/08/2013   Procedure: LAPAROSCOPIC HELLER MYOTOMY, DOR FUNDIPLICATION, UPPER ENDOSCOPY;  Surgeon: Odis Hollingshead, MD;  [facility];  Service: General;  Laterality: N/A;  . HERNIA REPAIR    . HYSTEROSCOPY WITH RESECTOSCOPE N/A 05/03/2015   Procedure: Diagnostic Hysteroscopy;  Surgeon: Lavonia Drafts, MD;  Location: [facility];  Service: Gynecology;  Laterality: N/A;  wants Novasure and myosure.  Marland Kitchen LYSIS OF ADHESION  06/28/2015   Procedure: LYSIS OF ADHESION;  Surgeon: Osborne Oman, MD;  Location: [facility];  Service: Gynecology;;  . OTHER SURGICAL HISTORY     biopsy axillary ,right arm  . right arm biopsy    . UPPER GI ENDOSCOPY N/A 10/08/2013   Procedure: UPPER GI ENDOSCOPY;  Surgeon: Odis Hollingshead, MD;  [facility];  Service: General;  Laterality: N/A;  . WISDOM TOOTH EXTRACTION     Social History   Occupational History  . Occupation: House wife  Tobacco Use  . Smoking status: Never Smoker  . Smokeless tobacco: Never Used  Substance and Sexual Activity  . Alcohol use: No  . Drug use: No  . Sexual activity: Not Currently    Partners: Male    Birth control/protection: Surgical    Comment: pt. given condoms

## 2017-12-23 NOTE — Assessment & Plan Note (Addendum)
Will change her to PPI If not better, GI eval.

## 2017-12-24 LAB — COMPREHENSIVE METABOLIC PANEL
AG RATIO: 1.4 (calc) (ref 1.0–2.5)
ALBUMIN MSPROF: 4.1 g/dL (ref 3.6–5.1)
ALT: 10 U/L (ref 6–29)
AST: 14 U/L (ref 10–30)
Alkaline phosphatase (APISO): 95 U/L (ref 33–115)
BILIRUBIN TOTAL: 0.3 mg/dL (ref 0.2–1.2)
BUN / CREAT RATIO: 10 (calc) (ref 6–22)
BUN: 12 mg/dL (ref 7–25)
CALCIUM: 9.5 mg/dL (ref 8.6–10.2)
CO2: 26 mmol/L (ref 20–32)
Chloride: 107 mmol/L (ref 98–110)
Creat: 1.21 mg/dL — ABNORMAL HIGH (ref 0.50–1.10)
Globulin: 2.9 g/dL (calc) (ref 1.9–3.7)
Glucose, Bld: 79 mg/dL (ref 65–99)
POTASSIUM: 3.7 mmol/L (ref 3.5–5.3)
SODIUM: 140 mmol/L (ref 135–146)
TOTAL PROTEIN: 7 g/dL (ref 6.1–8.1)

## 2017-12-24 LAB — CBC
HEMATOCRIT: 36.5 % (ref 35.0–45.0)
HEMOGLOBIN: 12.3 g/dL (ref 11.7–15.5)
MCH: 29.9 pg (ref 27.0–33.0)
MCHC: 33.7 g/dL (ref 32.0–36.0)
MCV: 88.8 fL (ref 80.0–100.0)
MPV: 11.3 fL (ref 7.5–12.5)
Platelets: 213 10*3/uL (ref 140–400)
RBC: 4.11 10*6/uL (ref 3.80–5.10)
RDW: 13 % (ref 11.0–15.0)
WBC: 4.2 10*3/uL (ref 3.8–10.8)

## 2017-12-24 LAB — RPR: RPR Ser Ql: NONREACTIVE

## 2017-12-24 LAB — T-HELPER CELL (CD4) - (RCID CLINIC ONLY)
CD4 T CELL HELPER: 33 % (ref 33–55)
CD4 T Cell Abs: 670 /uL (ref 400–2700)

## 2017-12-24 LAB — URINE CYTOLOGY ANCILLARY ONLY
Chlamydia: NEGATIVE
NEISSERIA GONORRHEA: NEGATIVE

## 2017-12-25 DIAGNOSIS — M25571 Pain in right ankle and joints of right foot: Secondary | ICD-10-CM | POA: Diagnosis not present

## 2017-12-25 DIAGNOSIS — M25671 Stiffness of right ankle, not elsewhere classified: Secondary | ICD-10-CM | POA: Diagnosis not present

## 2017-12-25 DIAGNOSIS — R262 Difficulty in walking, not elsewhere classified: Secondary | ICD-10-CM | POA: Diagnosis not present

## 2017-12-25 DIAGNOSIS — M6281 Muscle weakness (generalized): Secondary | ICD-10-CM | POA: Diagnosis not present

## 2017-12-25 LAB — HIV-1 RNA QUANT-NO REFLEX-BLD
HIV 1 RNA Quant: <20 copies/mL
HIV-1 RNA QUANT, LOG: NOT DETECTED {Log_copies}/mL

## 2017-12-27 DIAGNOSIS — M6281 Muscle weakness (generalized): Secondary | ICD-10-CM | POA: Diagnosis not present

## 2017-12-27 DIAGNOSIS — M25571 Pain in right ankle and joints of right foot: Secondary | ICD-10-CM | POA: Diagnosis not present

## 2017-12-27 DIAGNOSIS — M25671 Stiffness of right ankle, not elsewhere classified: Secondary | ICD-10-CM | POA: Diagnosis not present

## 2017-12-27 DIAGNOSIS — R262 Difficulty in walking, not elsewhere classified: Secondary | ICD-10-CM | POA: Diagnosis not present

## 2017-12-31 DIAGNOSIS — R262 Difficulty in walking, not elsewhere classified: Secondary | ICD-10-CM | POA: Diagnosis not present

## 2017-12-31 DIAGNOSIS — M25571 Pain in right ankle and joints of right foot: Secondary | ICD-10-CM | POA: Diagnosis not present

## 2017-12-31 DIAGNOSIS — M25671 Stiffness of right ankle, not elsewhere classified: Secondary | ICD-10-CM | POA: Diagnosis not present

## 2017-12-31 DIAGNOSIS — M6281 Muscle weakness (generalized): Secondary | ICD-10-CM | POA: Diagnosis not present

## 2018-01-02 ENCOUNTER — Other Ambulatory Visit: Payer: Self-pay | Admitting: Infectious Disease

## 2018-01-03 DIAGNOSIS — F410 Panic disorder [episodic paroxysmal anxiety] without agoraphobia: Secondary | ICD-10-CM | POA: Diagnosis not present

## 2018-01-03 DIAGNOSIS — F411 Generalized anxiety disorder: Secondary | ICD-10-CM | POA: Diagnosis not present

## 2018-01-09 DIAGNOSIS — F411 Generalized anxiety disorder: Secondary | ICD-10-CM | POA: Diagnosis not present

## 2018-01-09 DIAGNOSIS — F3132 Bipolar disorder, current episode depressed, moderate: Secondary | ICD-10-CM | POA: Diagnosis not present

## 2018-01-14 ENCOUNTER — Ambulatory Visit (INDEPENDENT_AMBULATORY_CARE_PROVIDER_SITE_OTHER): Payer: Medicare Other | Admitting: Orthopedic Surgery

## 2018-01-14 ENCOUNTER — Encounter (INDEPENDENT_AMBULATORY_CARE_PROVIDER_SITE_OTHER): Payer: Self-pay | Admitting: Orthopedic Surgery

## 2018-01-14 VITALS — Ht 61.0 in | Wt 173.0 lb

## 2018-01-14 DIAGNOSIS — M6281 Muscle weakness (generalized): Secondary | ICD-10-CM | POA: Diagnosis not present

## 2018-01-14 DIAGNOSIS — M25871 Other specified joint disorders, right ankle and foot: Secondary | ICD-10-CM

## 2018-01-14 DIAGNOSIS — R262 Difficulty in walking, not elsewhere classified: Secondary | ICD-10-CM | POA: Diagnosis not present

## 2018-01-14 DIAGNOSIS — M25671 Stiffness of right ankle, not elsewhere classified: Secondary | ICD-10-CM | POA: Diagnosis not present

## 2018-01-14 DIAGNOSIS — M25571 Pain in right ankle and joints of right foot: Secondary | ICD-10-CM | POA: Diagnosis not present

## 2018-01-14 NOTE — Progress Notes (Signed)
Office Visit Note   Patient: Danielle [name]           Date of Birth: 11-Feb-1971           MRN: [id number] Visit Date: 01/14/2018              Requested by: Campbell Riches, MD [address] [place], [place] [address] PCP: Campbell Riches, MD  Chief Complaint  Patient presents with  . Right Ankle - Routine Post Op    10/16/17 right ankle scope      HPI: Patient is a 47 year old woman who presents 3 months status post right ankle arthroscopy for impingement.  Patient is in regular sneakers she states her pain is worse with start up.  She is currently doing physical therapy 2 times a week and with the physical therapy today.  Assessment & Plan: Visit Diagnoses:  1. Impingement of right ankle joint     Plan: Continue with range of motion exercises for the ankle continue to increase her activities as tolerated.  She has no restrictions.  Follow-Up Instructions: Return if symptoms worsen or fail to improve.   Ortho Exam  Patient is alert, oriented, no adenopathy, well-dressed, normal affect, normal respiratory effort. Examination patient has a good pulse there is no hypersensitivity to light touch.  She has good ankle and subtalar range of motion she has dorsiflexion of the ankle 20 degrees past neutral.  Imaging: No results found. No images are attached to the encounter.  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 04/27/2015    Lab Results  Component Value Date/Time   HGBA1C 5.7 (H) 10/16/2017 07:28 AM   HGBA1C 5.7 (H) 01/17/2017 11:06 AM    Body mass index is 32.69 kg/m.  Orders:  No orders of the defined types were placed in this encounter.  No orders of the defined types were placed in this encounter.    Procedures: No procedures performed  Clinical Data: No additional findings.  ROS:  All other systems negative, except as noted in  the HPI. Review of Systems  Objective: Vital Signs: Ht 5\' 1"  (1.549 m)   Wt 173 lb (78.5 kg)   LMP  (LMP Unknown)   BMI 32.69 kg/m   Specialty Comments:  No specialty comments available.  PMFS History: Patient Active Problem List   Diagnosis Date Noted  . Impingement syndrome of right ankle   . Sprain of calcaneofibular ligament of right ankle 07/04/2017  . Ankle pain, right 06/24/2017  . Hyperglycemia 02/18/2017  . Hepatitis B immune 02/18/2017  . 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 S/P total abdominal hysterectomy 06/28/2015  . Abnormal uterine bleeding (AUB) 05/03/2015  . Condyloma acuminatum 02/23/2015  . Fibroids 01/05/2015  . Urinary incontinence 01/05/2015  . Abnormal finding on GI tract imaging 10/12/2013  . Anemia-chronic 10/09/2013  . Achalasia s/p laparoscopic Heller myotomy and Dor fundoplication 17/51/0258  . Ganglion cyst of finger of right hand 07/06/2013  . Trichomoniasis of vagina 06/03/2013  . GERD (gastroesophageal reflux disease) 03/16/2013  . Depression 02/10/2009  . Human immunodeficiency virus (HIV) disease ([place]) 03/24/2007  . Anxiety state 03/24/2007  . ASTHMA 03/24/2007   Past Medical History:  Diagnosis Date  . Anemia   . Anxiety   . Asthma   . Bulging lumbar disc   . Chronic back pain   . Depression   .  Genital HSV   . GERD (gastroesophageal reflux disease)   . Hepatitis    States she got a shot for this  . HIV infection ([place])   . Migraines   . Pneumonia   . Pre-diabetes     Family History  Problem Relation Age of Onset  . Hyperlipidemia Mother   . Diabetes Mother   . Stroke Mother   . Diabetes Brother   . Colon cancer Father   . Esophageal cancer Neg Hx   . Rectal cancer Neg Hx   . Stomach cancer Neg Hx     Past Surgical History:  Procedure Laterality Date  . ABDOMINAL HYSTERECTOMY N/A 06/28/2015   Procedure: HYSTERECTOMY ABDOMINAL;  Surgeon: Osborne Oman, MD;  Location: [facility];  Service: Gynecology;  Laterality:  N/A;  Requested 06/28/15 @ 7:30a  . ANKLE ARTHROSCOPY Right 10/16/2017   Procedure: RIGHT ANKLE ARTHROSCOPIC DEBRIDEMENT;  Surgeon: Newt Minion, MD;  Location: [facility];  Service: Orthopedics;  Laterality: Right;  . BILATERAL SALPINGECTOMY Bilateral 06/28/2015   Procedure: BILATERAL SALPINGECTOMY;  Surgeon: Osborne Oman, MD;  Location: [facility];  Service: Gynecology;  Laterality: Bilateral;  . CESAREAN SECTION     twice;  vertical incision x 2  . COLONOSCOPY N/A 10/12/2013   Procedure: COLONOSCOPY;  Surgeon: Jerene Bears, MD;  Location: [facility];  Service: Endoscopy;  Laterality: N/A;  . ESOPHAGEAL MANOMETRY N/A 06/29/2013   Procedure: ESOPHAGEAL MANOMETRY (EM);  Surgeon: Jerene Bears, MD;  Location: [facility];  Service: Gastroenterology;  Laterality: N/A;  . HELLER MYOTOMY N/A 10/08/2013   Procedure: LAPAROSCOPIC HELLER MYOTOMY, DOR FUNDIPLICATION, UPPER ENDOSCOPY;  Surgeon: Odis Hollingshead, MD;  Location: [facility];  Service: General;  Laterality: N/A;  . HERNIA REPAIR    . HYSTEROSCOPY WITH RESECTOSCOPE N/A 05/03/2015   Procedure: Diagnostic Hysteroscopy;  Surgeon: Lavonia Drafts, MD;  Location: [facility];  Service: Gynecology;  Laterality: N/A;  wants Novasure and myosure.  Marland Kitchen LYSIS OF ADHESION  06/28/2015   Procedure: LYSIS OF ADHESION;  Surgeon: Osborne Oman, MD;  Location: [facility];  Service: Gynecology;;  . OTHER SURGICAL HISTORY     biopsy axillary ,right arm  . right arm biopsy    . UPPER GI ENDOSCOPY N/A 10/08/2013   Procedure: UPPER GI ENDOSCOPY;  Surgeon: Odis Hollingshead, MD;  Location: [facility];  Service: General;  Laterality: N/A;  . WISDOM TOOTH EXTRACTION     Social History   Occupational History  . Occupation: House wife  Tobacco Use  . Smoking status: Never Smoker  . Smokeless tobacco: Never Used  Substance and Sexual Activity  . Alcohol use: No  . Drug use: No  . Sexual activity: Not Currently    Partners: Male    Birth control/protection: Surgical     Comment: pt. given condoms

## 2018-01-16 DIAGNOSIS — R262 Difficulty in walking, not elsewhere classified: Secondary | ICD-10-CM | POA: Diagnosis not present

## 2018-01-16 DIAGNOSIS — M25671 Stiffness of right ankle, not elsewhere classified: Secondary | ICD-10-CM | POA: Diagnosis not present

## 2018-01-16 DIAGNOSIS — M6281 Muscle weakness (generalized): Secondary | ICD-10-CM | POA: Diagnosis not present

## 2018-01-16 DIAGNOSIS — M25571 Pain in right ankle and joints of right foot: Secondary | ICD-10-CM | POA: Diagnosis not present

## 2018-01-17 DIAGNOSIS — F410 Panic disorder [episodic paroxysmal anxiety] without agoraphobia: Secondary | ICD-10-CM | POA: Diagnosis not present

## 2018-01-17 DIAGNOSIS — F411 Generalized anxiety disorder: Secondary | ICD-10-CM | POA: Diagnosis not present

## 2018-01-23 DIAGNOSIS — M25671 Stiffness of right ankle, not elsewhere classified: Secondary | ICD-10-CM | POA: Diagnosis not present

## 2018-01-23 DIAGNOSIS — M25571 Pain in right ankle and joints of right foot: Secondary | ICD-10-CM | POA: Diagnosis not present

## 2018-01-23 DIAGNOSIS — M6281 Muscle weakness (generalized): Secondary | ICD-10-CM | POA: Diagnosis not present

## 2018-01-23 DIAGNOSIS — R262 Difficulty in walking, not elsewhere classified: Secondary | ICD-10-CM | POA: Diagnosis not present

## 2018-01-28 DIAGNOSIS — M25671 Stiffness of right ankle, not elsewhere classified: Secondary | ICD-10-CM | POA: Diagnosis not present

## 2018-01-28 DIAGNOSIS — M25571 Pain in right ankle and joints of right foot: Secondary | ICD-10-CM | POA: Diagnosis not present

## 2018-01-28 DIAGNOSIS — M6281 Muscle weakness (generalized): Secondary | ICD-10-CM | POA: Diagnosis not present

## 2018-01-28 DIAGNOSIS — R262 Difficulty in walking, not elsewhere classified: Secondary | ICD-10-CM | POA: Diagnosis not present

## 2018-01-31 DIAGNOSIS — F411 Generalized anxiety disorder: Secondary | ICD-10-CM | POA: Diagnosis not present

## 2018-01-31 DIAGNOSIS — F410 Panic disorder [episodic paroxysmal anxiety] without agoraphobia: Secondary | ICD-10-CM | POA: Diagnosis not present

## 2018-02-04 DIAGNOSIS — M6281 Muscle weakness (generalized): Secondary | ICD-10-CM | POA: Diagnosis not present

## 2018-02-04 DIAGNOSIS — R262 Difficulty in walking, not elsewhere classified: Secondary | ICD-10-CM | POA: Diagnosis not present

## 2018-02-04 DIAGNOSIS — M25571 Pain in right ankle and joints of right foot: Secondary | ICD-10-CM | POA: Diagnosis not present

## 2018-02-04 DIAGNOSIS — M25671 Stiffness of right ankle, not elsewhere classified: Secondary | ICD-10-CM | POA: Diagnosis not present

## 2018-02-05 DIAGNOSIS — R350 Frequency of micturition: Secondary | ICD-10-CM | POA: Diagnosis not present

## 2018-02-05 DIAGNOSIS — R351 Nocturia: Secondary | ICD-10-CM | POA: Diagnosis not present

## 2018-02-13 DIAGNOSIS — M25571 Pain in right ankle and joints of right foot: Secondary | ICD-10-CM | POA: Diagnosis not present

## 2018-02-13 DIAGNOSIS — M25671 Stiffness of right ankle, not elsewhere classified: Secondary | ICD-10-CM | POA: Diagnosis not present

## 2018-02-13 DIAGNOSIS — M6281 Muscle weakness (generalized): Secondary | ICD-10-CM | POA: Diagnosis not present

## 2018-02-13 DIAGNOSIS — R262 Difficulty in walking, not elsewhere classified: Secondary | ICD-10-CM | POA: Diagnosis not present

## 2018-02-14 DIAGNOSIS — F411 Generalized anxiety disorder: Secondary | ICD-10-CM | POA: Diagnosis not present

## 2018-02-14 DIAGNOSIS — F410 Panic disorder [episodic paroxysmal anxiety] without agoraphobia: Secondary | ICD-10-CM | POA: Diagnosis not present

## 2018-02-14 DIAGNOSIS — F4321 Adjustment disorder with depressed mood: Secondary | ICD-10-CM | POA: Diagnosis not present

## 2018-02-15 ENCOUNTER — Emergency Department (HOSPITAL_COMMUNITY): Payer: Medicare Other

## 2018-02-15 ENCOUNTER — Emergency Department (HOSPITAL_COMMUNITY)
Admission: EM | Admit: 2018-02-15 | Discharge: 2018-02-15 | Disposition: A | Discharge status: Home / Self Care | Payer: Medicare Other | Attending: Emergency Medicine | Admitting: Emergency Medicine

## 2018-02-15 ENCOUNTER — Encounter (HOSPITAL_COMMUNITY): Payer: Self-pay | Admitting: Emergency Medicine

## 2018-02-15 ENCOUNTER — Other Ambulatory Visit: Payer: Self-pay

## 2018-02-15 DIAGNOSIS — Y999 Unspecified external cause status: Secondary | ICD-10-CM | POA: Insufficient documentation

## 2018-02-15 DIAGNOSIS — Y929 Unspecified place or not applicable: Secondary | ICD-10-CM | POA: Insufficient documentation

## 2018-02-15 DIAGNOSIS — W109XXA Fall (on) (from) unspecified stairs and steps, initial encounter: Secondary | ICD-10-CM | POA: Diagnosis not present

## 2018-02-15 DIAGNOSIS — S3992XA Unspecified injury of lower back, initial encounter: Secondary | ICD-10-CM | POA: Diagnosis not present

## 2018-02-15 DIAGNOSIS — S39012A Strain of muscle, fascia and tendon of lower back, initial encounter: Secondary | ICD-10-CM

## 2018-02-15 DIAGNOSIS — M25471 Effusion, right ankle: Secondary | ICD-10-CM | POA: Diagnosis not present

## 2018-02-15 DIAGNOSIS — J45909 Unspecified asthma, uncomplicated: Secondary | ICD-10-CM | POA: Insufficient documentation

## 2018-02-15 DIAGNOSIS — Y939 Activity, unspecified: Secondary | ICD-10-CM | POA: Diagnosis not present

## 2018-02-15 DIAGNOSIS — Z91040 Latex allergy status: Secondary | ICD-10-CM | POA: Insufficient documentation

## 2018-02-15 DIAGNOSIS — Z21 Asymptomatic human immunodeficiency virus [HIV] infection status: Secondary | ICD-10-CM | POA: Diagnosis not present

## 2018-02-15 DIAGNOSIS — M25571 Pain in right ankle and joints of right foot: Secondary | ICD-10-CM | POA: Diagnosis present

## 2018-02-15 DIAGNOSIS — Z79899 Other long term (current) drug therapy: Secondary | ICD-10-CM | POA: Insufficient documentation

## 2018-02-15 DIAGNOSIS — M545 Low back pain: Secondary | ICD-10-CM | POA: Diagnosis not present

## 2018-02-15 MED ORDER — IBUPROFEN 600 MG PO TABS: 600.0000 mg | ORAL_TABLET | Freq: Four times a day (QID) | ORAL | 0 refills | Status: DC | PRN

## 2018-02-15 MED ORDER — CYCLOBENZAPRINE HCL 5 MG PO TABS: 5.0000 mg | ORAL_TABLET | Freq: Two times a day (BID) | ORAL | 0 refills | Status: DC | PRN

## 2018-02-15 NOTE — ED Notes (Signed)
Patient verbalizes understanding of discharge instructions. Opportunity for questioning and answers were provided. Armband removed by staff, pt discharged from ED.  

## 2018-02-15 NOTE — ED Provider Notes (Signed)
[facility] EMERGENCY DEPARTMENT Provider Note   CSN: [id number] Arrival date & time: 02/15/18  1040     History   Chief Complaint Chief Complaint  Patient presents with  . Back Pain  . Ankle Pain    HPI Danielle [name] is a 47 y.o. female.  Pt comes in with c/o lower back pain and right ankle pain after falling down the stairs 2 days ago. She states that she fell down the whole flight. Denies numbness ow weakness. No loc. She states that she had surgery in February to clean out the ankle. She has not taken anything. She states that sometime the pain in her back runs down the right leg     Past Medical History:  Diagnosis Date  . Anemia   . Anxiety   . Asthma   . Bulging lumbar disc   . Chronic back pain   . Depression   . Genital HSV   . GERD (gastroesophageal reflux disease)   . Hepatitis    States she got a shot for this  . HIV infection ([place])   . Migraines   . Pneumonia   . Pre-diabetes     Patient Active Problem List   Diagnosis Date Noted  . Impingement syndrome of right ankle   . Sprain of calcaneofibular ligament of right ankle 07/04/2017  . Ankle pain, right 06/24/2017  . Hyperglycemia 02/18/2017  . Hepatitis B immune 02/18/2017  . 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 S/P total abdominal hysterectomy 06/28/2015  . Abnormal uterine bleeding (AUB) 05/03/2015  . Condyloma acuminatum 02/23/2015  . Fibroids 01/05/2015  . Urinary incontinence 01/05/2015  . Abnormal finding on GI tract imaging 10/12/2013  . Anemia-chronic 10/09/2013  . Achalasia s/p laparoscopic Heller myotomy and Dor fundoplication 78/46/9629  . Ganglion cyst of finger of right hand 07/06/2013  . Trichomoniasis of vagina 06/03/2013  . GERD (gastroesophageal reflux disease) 03/16/2013  . Depression 02/10/2009  . Human immunodeficiency virus (HIV) disease ([place]) 03/24/2007  . Anxiety state 03/24/2007  . ASTHMA 03/24/2007    Past Surgical History:  Procedure  Laterality Date  . ABDOMINAL HYSTERECTOMY N/A 06/28/2015   Procedure: HYSTERECTOMY ABDOMINAL;  Surgeon: Osborne Oman, MD;  Location: [facility];  Service: Gynecology;  Laterality: N/A;  Requested 06/28/15 @ 7:30a  . ANKLE ARTHROSCOPY Right 10/16/2017   Procedure: RIGHT ANKLE ARTHROSCOPIC DEBRIDEMENT;  Surgeon: Newt Minion, MD;  Location: [facility];  Service: Orthopedics;  Laterality: Right;  . BILATERAL SALPINGECTOMY Bilateral 06/28/2015   Procedure: BILATERAL SALPINGECTOMY;  Surgeon: Osborne Oman, MD;  Location: [facility];  Service: Gynecology;  Laterality: Bilateral;  . CESAREAN SECTION     twice;  vertical incision x 2  . COLONOSCOPY N/A 10/12/2013   Procedure: COLONOSCOPY;  Surgeon: Jerene Bears, MD;  Location: [facility];  Service: Endoscopy;  Laterality: N/A;  . ESOPHAGEAL MANOMETRY N/A 06/29/2013   Procedure: ESOPHAGEAL MANOMETRY (EM);  Surgeon: Jerene Bears, MD;  Location: [facility];  Service: Gastroenterology;  Laterality: N/A;  . HELLER MYOTOMY N/A 10/08/2013   Procedure: LAPAROSCOPIC HELLER MYOTOMY, DOR FUNDIPLICATION, UPPER ENDOSCOPY;  Surgeon: Odis Hollingshead, MD;  Location: [facility];  Service: General;  Laterality: N/A;  . HERNIA REPAIR    . HYSTEROSCOPY WITH RESECTOSCOPE N/A 05/03/2015   Procedure: Diagnostic Hysteroscopy;  Surgeon: Lavonia Drafts, MD;  Location: [facility];  Service: Gynecology;  Laterality: N/A;  wants Novasure and myosure.  Marland Kitchen LYSIS OF ADHESION  06/28/2015   Procedure: LYSIS OF  ADHESION;  Surgeon: Osborne Oman, MD;  Location: [facility];  Service: Gynecology;;  . OTHER SURGICAL HISTORY     biopsy axillary ,right arm  . right arm biopsy    . UPPER GI ENDOSCOPY N/A 10/08/2013   Procedure: UPPER GI ENDOSCOPY;  Surgeon: Odis Hollingshead, MD;  Location: [facility];  Service: General;  Laterality: N/A;  . WISDOM TOOTH EXTRACTION       OB History    Gravida  5   Para  3   Term  3   Preterm      AB  2   Living  3     SAB  2   TAB      Ectopic       Multiple      Live Births               Home Medications    Prior to Admission medications   Medication Sig Start Date End Date Taking? Authorizing Provider  bictegravir-emtricitabine-tenofovir AF (BIKTARVY) 50-200-25 MG TABS tablet Take 1 tablet by mouth daily. 12/23/17   Campbell Riches, MD  butalbital-acetaminophen-caffeine (FIORICET, ESGIC) [phone] MG tablet Take 1-2 tablets by mouth every 4 (four) hours as needed for headache. Max 6 caps/day 11/29/17   Melynda Ripple, MD  cyclobenzaprine (FLEXERIL) 10 MG tablet Take 10 mg by mouth 3 (three) times daily as needed for muscle spasms.    [provider]  DULoxetine (CYMBALTA) 30 MG capsule Take 30 mg by mouth at bedtime.    [provider]  omeprazole (PRILOSEC) 20 MG capsule Take 1 capsule (20 mg total) by mouth daily. 12/23/17   Campbell Riches, MD  oxybutynin (DITROPAN-XL) 10 MG 24 hr tablet Take 10 mg by mouth daily.    [provider]  PROAIR HFA 108 (90 BASE) MCG/ACT inhaler Inhale 2 puffs into the lungs every 4 (four) hours as needed for wheezing or shortness of breath. Shortness of breath/wheezing 12/31/14   [provider]  Doug Sou 90 MCG/ACT inhaler Inhale 2 puffs by mouth twice daily 02/08/17   [provider]  SUMAtriptan (IMITREX) 100 MG tablet Take 1 tablet (100 mg total) by mouth every 2 (two) hours as needed for migraine. May repeat in 2 hours if headache persists or recurs. 01/02/17   Lysbeth Penner, FNP  valACYclovir (VALTREX) 500 MG tablet TAKE 1 TABLET(500 MG) BY MOUTH DAILY 01/02/18   Tommy Medal, Lavell Islam, MD    Family History Family History  Problem Relation Age of Onset  . Hyperlipidemia Mother   . Diabetes Mother   . Stroke Mother   . Diabetes Brother   . Colon cancer Father   . Esophageal cancer Neg Hx   . Rectal cancer Neg Hx   . Stomach cancer Neg Hx     Social History Social History   Tobacco Use  . Smoking status: Never Smoker    . Smokeless tobacco: Never Used  Substance Use Topics  . Alcohol use: No  . Drug use: No     Allergies   Latex   Review of Systems Review of Systems  All other systems reviewed and are negative.    Physical Exam Updated Vital Signs BP 138/81 (BP Location: Right Arm)   Pulse 78   Temp 98.5 F (36.9 C) (Oral)   Resp 20   Ht 5\' 1"  (1.549 m)   Wt 80.7 kg (178 lb)   LMP  (LMP Unknown)   SpO2 99%  BMI 33.63 kg/m   Physical Exam  Constitutional: She appears well-developed and well-nourished.  HENT:  Head: Normocephalic and atraumatic.  Cardiovascular: Normal rate.  Pulmonary/Chest: Effort normal.  Abdominal: Soft. Bowel sounds are normal.  Musculoskeletal:  Tender along the lumbar spine and generalized paraspinal tenderness. Good strength and sensation to the bilateral lower extremities. Generalized tenderness to the right ankle without gross deformity of swelling  Skin: Skin is warm.  Psychiatric: She has a normal mood and affect.  Nursing note and vitals reviewed.    ED Treatments / Results  Labs (all labs ordered are listed, but only abnormal results are displayed) Labs Reviewed - No data to display  EKG None  Radiology Dg Lumbar Spine Complete  Result Date: 02/15/2018 CLINICAL DATA:  Pain following fall EXAM: LUMBAR SPINE - COMPLETE 4+ VIEW COMPARISON:  None. FINDINGS: Frontal, lateral, spot lumbosacral lateral, and bilateral oblique views were obtained. There are 5 non-rib-bearing lumbar type vertebral bodies. There is no fracture or spondylolisthesis. Disc spaces appear normal. There is no appreciable facet arthropathy. IMPRESSION: No fracture or spondylolisthesis. No appreciable arthropathic change. Electronically Signed   By: Lowella Grip III M.D.   On: 02/15/2018 11:52   Dg Ankle Complete Right  Result Date: 02/15/2018 CLINICAL DATA:  Pain following fall EXAM: RIGHT ANKLE - COMPLETE 3+ VIEW COMPARISON:  None. FINDINGS: Frontal, oblique, and lateral  views were obtained. There is mild soft tissue swelling with a small joint effusion. No fracture evident. Joint spaces appear normal. No erosive change. The ankle mortise appears intact. IMPRESSION: Soft tissue swelling with small joint effusion. Question underlying ligamentous injury. No evident fracture or arthropathy. Ankle mortise appears intact. Electronically Signed   By: Lowella Grip III M.D.   On: 02/15/2018 11:53    Procedures Procedures (including critical care time)  Medications Ordered in ED Medications - No data to display   Initial Impression / Assessment and Plan / ED Course  I have reviewed the triage vital signs and the nursing notes.  Pertinent labs & imaging results that were available during my care of the patient were reviewed by me and considered in my medical decision making (see chart for details).     Imaging without any acute injury. Will treat symptomatically with something for pain  Final Clinical Impressions(s) / ED Diagnoses   Final diagnoses:  None    ED Discharge Orders    None       Glendell Docker, NP 02/15/18 [place], MD 02/15/18 [id number]

## 2018-02-15 NOTE — ED Triage Notes (Signed)
Pt. Stated, I fell on Thursday and my back and rt. Ankle pain. Had ankle surgery on Feb. 6, 2019 and repair some ligaments.

## 2018-02-15 NOTE — ED Notes (Signed)
Patient transported to X-ray 

## 2018-02-18 DIAGNOSIS — M6281 Muscle weakness (generalized): Secondary | ICD-10-CM | POA: Diagnosis not present

## 2018-02-18 DIAGNOSIS — M25571 Pain in right ankle and joints of right foot: Secondary | ICD-10-CM | POA: Diagnosis not present

## 2018-02-18 DIAGNOSIS — R262 Difficulty in walking, not elsewhere classified: Secondary | ICD-10-CM | POA: Diagnosis not present

## 2018-02-18 DIAGNOSIS — M25671 Stiffness of right ankle, not elsewhere classified: Secondary | ICD-10-CM | POA: Diagnosis not present

## 2018-02-19 ENCOUNTER — Emergency Department (HOSPITAL_COMMUNITY)
Admission: EM | Admit: 2018-02-19 | Discharge: 2018-02-20 | Disposition: A | Discharge status: Home / Self Care | Payer: Medicare Other | Attending: Emergency Medicine | Admitting: Emergency Medicine

## 2018-02-19 ENCOUNTER — Emergency Department (HOSPITAL_COMMUNITY): Payer: Medicare Other

## 2018-02-19 ENCOUNTER — Encounter (HOSPITAL_COMMUNITY): Payer: Self-pay | Admitting: Emergency Medicine

## 2018-02-19 DIAGNOSIS — R51 Headache: Secondary | ICD-10-CM | POA: Insufficient documentation

## 2018-02-19 DIAGNOSIS — R0789 Other chest pain: Secondary | ICD-10-CM | POA: Diagnosis not present

## 2018-02-19 DIAGNOSIS — R079 Chest pain, unspecified: Secondary | ICD-10-CM | POA: Diagnosis not present

## 2018-02-19 DIAGNOSIS — R05 Cough: Secondary | ICD-10-CM | POA: Diagnosis not present

## 2018-02-19 DIAGNOSIS — Z79899 Other long term (current) drug therapy: Secondary | ICD-10-CM | POA: Diagnosis not present

## 2018-02-19 DIAGNOSIS — B349 Viral infection, unspecified: Secondary | ICD-10-CM | POA: Diagnosis not present

## 2018-02-19 DIAGNOSIS — J45909 Unspecified asthma, uncomplicated: Secondary | ICD-10-CM | POA: Diagnosis not present

## 2018-02-19 DIAGNOSIS — B20 Human immunodeficiency virus [HIV] disease: Secondary | ICD-10-CM | POA: Diagnosis not present

## 2018-02-19 DIAGNOSIS — Z91040 Latex allergy status: Secondary | ICD-10-CM | POA: Insufficient documentation

## 2018-02-19 DIAGNOSIS — R509 Fever, unspecified: Secondary | ICD-10-CM | POA: Diagnosis present

## 2018-02-19 LAB — COMPREHENSIVE METABOLIC PANEL
ALK PHOS: 98 U/L (ref 38–126)
ALT: 28 U/L (ref 14–54)
ANION GAP: 10 (ref 5–15)
AST: 29 U/L (ref 15–41)
Albumin: 4.1 g/dL (ref 3.5–5.0)
BUN: 6 mg/dL (ref 6–20)
CALCIUM: 9.7 mg/dL (ref 8.9–10.3)
CO2: 23 mmol/L (ref 22–32)
Chloride: 103 mmol/L (ref 101–111)
Creatinine, Ser: 1.1 mg/dL — ABNORMAL HIGH (ref 0.44–1.00)
GFR calc non Af Amer: >60 mL/min (ref >60)
Glucose, Bld: 124 mg/dL — ABNORMAL HIGH (ref 65–99)
Potassium: 3.2 mmol/L — ABNORMAL LOW (ref 3.5–5.1)
SODIUM: 136 mmol/L (ref 135–145)
Total Bilirubin: 0.6 mg/dL (ref 0.3–1.2)
Total Protein: 7.4 g/dL (ref 6.5–8.1)

## 2018-02-19 LAB — URINALYSIS, ROUTINE W REFLEX MICROSCOPIC
BILIRUBIN URINE: NEGATIVE
GLUCOSE, UA: NEGATIVE mg/dL
Hgb urine dipstick: NEGATIVE
KETONES UR: NEGATIVE mg/dL
LEUKOCYTES UA: NEGATIVE
Nitrite: NEGATIVE
PH: 5 (ref 5.0–8.0)
Protein, ur: NEGATIVE mg/dL
Specific Gravity, Urine: 1.019 (ref 1.005–1.030)

## 2018-02-19 LAB — CBC WITH DIFFERENTIAL/PLATELET
ABS IMMATURE GRANULOCYTES: 0.1 10*3/uL (ref 0.0–0.1)
BASOS ABS: 0.1 10*3/uL (ref 0.0–0.1)
Basophils Relative: 0 %
Eosinophils Absolute: 0 10*3/uL (ref 0.0–0.7)
Eosinophils Relative: 0 %
HCT: 42.4 % (ref 36.0–46.0)
HEMOGLOBIN: 13.7 g/dL (ref 12.0–15.0)
IMMATURE GRANULOCYTES: 0 %
LYMPHS PCT: 11 %
Lymphs Abs: 1.3 10*3/uL (ref 0.7–4.0)
MCH: 29 pg (ref 26.0–34.0)
MCHC: 32.3 g/dL (ref 30.0–36.0)
MCV: 89.8 fL (ref 78.0–100.0)
Monocytes Absolute: 0.6 10*3/uL (ref 0.1–1.0)
Monocytes Relative: 5 %
NEUTROS ABS: 9.6 10*3/uL — AB (ref 1.7–7.7)
Neutrophils Relative %: 84 %
Platelets: 231 10*3/uL (ref 150–400)
RBC: 4.72 MIL/uL (ref 3.87–5.11)
RDW: 13.2 % (ref 11.5–15.5)
WBC: 11.5 10*3/uL — AB (ref 4.0–10.5)

## 2018-02-19 LAB — I-STAT CG4 LACTIC ACID, ED: Lactic Acid, Venous: 1.91 mmol/L — ABNORMAL HIGH (ref 0.5–1.9)

## 2018-02-19 LAB — I-STAT BETA HCG BLOOD, ED (MC, WL, AP ONLY): I-stat hCG, quantitative: <5 m[IU]/mL (ref <5)

## 2018-02-19 LAB — PROTIME-INR
INR: 0.93
PROTHROMBIN TIME: 12.3 s (ref 11.4–15.2)

## 2018-02-19 MED ORDER — METOCLOPRAMIDE HCL 5 MG/ML IJ SOLN
10.0000 mg | Freq: Once | INTRAMUSCULAR | Status: AC
  Administered 2018-02-19: 10 mg via INTRAVENOUS
  Filled 2018-02-19: qty 2

## 2018-02-19 MED ORDER — DIPHENHYDRAMINE HCL 50 MG/ML IJ SOLN
12.5000 mg | Freq: Once | INTRAMUSCULAR | Status: AC
  Administered 2018-02-19: 12.5 mg via INTRAVENOUS
  Filled 2018-02-19: qty 1

## 2018-02-19 MED ORDER — KETOROLAC TROMETHAMINE 15 MG/ML IJ SOLN
15.0000 mg | Freq: Once | INTRAMUSCULAR | Status: AC
  Administered 2018-02-19: 15 mg via INTRAVENOUS
  Filled 2018-02-19: qty 1

## 2018-02-19 MED ORDER — SODIUM CHLORIDE 0.9 % IV BOLUS
1000.0000 mL | Freq: Once | INTRAVENOUS | Status: AC
  Administered 2018-02-19: 1000 mL via INTRAVENOUS

## 2018-02-19 MED ORDER — ALBUTEROL SULFATE (2.5 MG/3ML) 0.083% IN NEBU
5.0000 mg | INHALATION_SOLUTION | Freq: Once | RESPIRATORY_TRACT | Status: AC
  Administered 2018-02-19: 5 mg via RESPIRATORY_TRACT
  Filled 2018-02-19: qty 6

## 2018-02-19 MED ORDER — ACETAMINOPHEN 325 MG PO TABS
650.0000 mg | ORAL_TABLET | Freq: Once | ORAL | Status: AC
  Administered 2018-02-19: 650 mg via ORAL
  Filled 2018-02-19: qty 2

## 2018-02-19 NOTE — ED Provider Notes (Signed)
[facility] EMERGENCY DEPARTMENT Provider Note   CSN: [id number] Arrival date & time: 02/19/18  2110     History   Chief Complaint Chief Complaint  Patient presents with  . Fever    HPI Danielle [name] is a 47 y.o. female.  Patient with a history of prediabetes, HIV (last viral load <20), migraines presents with symptoms of cough, chest discomfort that radiates to back, and headache that started today. Husband reports fever at home of 101. He was concerned because he felt she was difficult to wake up when he came home. Nothing given for fever prior to arrival. No nausea, vomiting, diarrhea, congestion. Her cough is infrequent and dry. She reports her headache is typical of her migraine headache pain that is frontal and associated with photophobia. She reports being completely asymptomatic yesterday.   The history is provided by the patient. No language interpreter was used.    Past Medical History:  Diagnosis Date  . Anemia   . Anxiety   . Asthma   . Bulging lumbar disc   . Chronic back pain   . Depression   . Genital HSV   . GERD (gastroesophageal reflux disease)   . Hepatitis    States she got a shot for this  . HIV infection ([place])   . Migraines   . Pneumonia   . Pre-diabetes     Patient Active Problem List   Diagnosis Date Noted  . Impingement syndrome of right ankle   . Sprain of calcaneofibular ligament of right ankle 07/04/2017  . Ankle pain, right 06/24/2017  . Hyperglycemia 02/18/2017  . Hepatitis B immune 02/18/2017  . 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 S/P total abdominal hysterectomy 06/28/2015  . Abnormal uterine bleeding (AUB) 05/03/2015  . Condyloma acuminatum 02/23/2015  . Fibroids 01/05/2015  . Urinary incontinence 01/05/2015  . Abnormal finding on GI tract imaging 10/12/2013  . Anemia-chronic 10/09/2013  . Achalasia s/p laparoscopic Heller myotomy and Dor fundoplication 84/13/2440  . Ganglion cyst of finger of right hand  07/06/2013  . Trichomoniasis of vagina 06/03/2013  . GERD (gastroesophageal reflux disease) 03/16/2013  . Depression 02/10/2009  . Human immunodeficiency virus (HIV) disease ([place]) 03/24/2007  . Anxiety state 03/24/2007  . ASTHMA 03/24/2007    Past Surgical History:  Procedure Laterality Date  . ABDOMINAL HYSTERECTOMY N/A 06/28/2015   Procedure: HYSTERECTOMY ABDOMINAL;  Surgeon: Osborne Oman, MD;  Location: [facility];  Service: Gynecology;  Laterality: N/A;  Requested 06/28/15 @ 7:30a  . ANKLE ARTHROSCOPY Right 10/16/2017   Procedure: RIGHT ANKLE ARTHROSCOPIC DEBRIDEMENT;  Surgeon: Newt Minion, MD;  Location: [facility];  Service: Orthopedics;  Laterality: Right;  . BILATERAL SALPINGECTOMY Bilateral 06/28/2015   Procedure: BILATERAL SALPINGECTOMY;  Surgeon: Osborne Oman, MD;  Location: [facility];  Service: Gynecology;  Laterality: Bilateral;  . CESAREAN SECTION     twice;  vertical incision x 2  . COLONOSCOPY N/A 10/12/2013   Procedure: COLONOSCOPY;  Surgeon: Jerene Bears, MD;  [facility];  Service: Endoscopy;  Laterality: N/A;  . ESOPHAGEAL MANOMETRY N/A 06/29/2013   Procedure: ESOPHAGEAL MANOMETRY (EM);  Surgeon: Jerene Bears, MD;  [facility]: [facility];  Service: Gastroenterology;  Laterality: N/A;  . HELLER MYOTOMY N/A 10/08/2013   Procedure: LAPAROSCOPIC HELLER MYOTOMY, DOR FUNDIPLICATION, UPPER ENDOSCOPY;  Surgeon: Odis Hollingshead, MD;  Location: [facility];  Service: General;  Laterality: N/A;  . HERNIA REPAIR    . HYSTEROSCOPY WITH RESECTOSCOPE N/A 05/03/2015   Procedure:  Diagnostic Hysteroscopy;  Surgeon: Lavonia Drafts, MD;  Location: [facility];  Service: Gynecology;  Laterality: N/A;  wants Novasure and myosure.  Marland Kitchen LYSIS OF ADHESION  06/28/2015   Procedure: LYSIS OF ADHESION;  Surgeon: Osborne Oman, MD;  Location: [facility];  Service: Gynecology;;  . OTHER SURGICAL HISTORY     biopsy axillary ,right arm  . right arm biopsy    . UPPER GI ENDOSCOPY N/A 10/08/2013    Procedure: UPPER GI ENDOSCOPY;  Surgeon: Odis Hollingshead, MD;  [facility];  Service: General;  Laterality: N/A;  . WISDOM TOOTH EXTRACTION       OB History    Gravida  5   Para  3   Term  3   Preterm      AB  2   Living  3     SAB  2   TAB      Ectopic      Multiple      Live Births               Home Medications    Prior to Admission medications   Medication Sig Start Date End Date Taking? Authorizing Provider  bictegravir-emtricitabine-tenofovir AF (BIKTARVY) 50-200-25 MG TABS tablet Take 1 tablet by mouth daily. 12/23/17  Yes Campbell Riches, MD  butalbital-acetaminophen-caffeine (FIORICET, ESGIC) [phone] MG tablet Take 1-2 tablets by mouth every 4 (four) hours as needed for headache. Max 6 caps/day 11/29/17  Yes Melynda Ripple, MD  cyclobenzaprine (FLEXERIL) 5 MG tablet Take 1 tablet (5 mg total) by mouth 2 (two) times daily as needed for muscle spasms. 02/15/18  Yes Glendell Docker, NP  DULoxetine (CYMBALTA) 30 MG capsule Take 30 mg by mouth at bedtime.   Yes [provider]  ibuprofen (ADVIL,MOTRIN) 600 MG tablet Take 1 tablet (600 mg total) by mouth every 6 (six) hours as needed. 02/15/18  Yes Glendell Docker, NP  omeprazole (PRILOSEC) 20 MG capsule Take 1 capsule (20 mg total) by mouth daily. 12/23/17  Yes Campbell Riches, MD  oxybutynin (DITROPAN-XL) 10 MG 24 hr tablet Take 10 mg by mouth daily.   Yes [provider]  PROAIR HFA 108 (90 BASE) MCG/ACT inhaler Inhale 2 puffs into the lungs every 4 (four) hours as needed for wheezing or shortness of breath. Shortness of breath/wheezing 12/31/14  Yes [provider]  Doug Sou 90 MCG/ACT inhaler Inhale 2 puffs by mouth twice daily 02/08/17  Yes [provider]  SUMAtriptan (IMITREX) 100 MG tablet Take 1 tablet (100 mg total) by mouth every 2 (two) hours as needed for migraine. May repeat in 2 hours if headache persists or recurs. 01/02/17  Yes Lysbeth Penner, FNP  valACYclovir (VALTREX) 500 MG tablet TAKE 1 TABLET(500 MG) BY MOUTH DAILY 01/02/18  Yes Tommy Medal, Lavell Islam, MD    Family History Family History  Problem Relation Age of Onset  . Hyperlipidemia Mother   . Diabetes Mother   . Stroke Mother   . Diabetes Brother   . Colon cancer Father   . Esophageal cancer Neg Hx   . Rectal cancer Neg Hx   . Stomach cancer Neg Hx     Social History Social History   Tobacco Use  . Smoking status: Never Smoker  . Smokeless tobacco: Never Used  Substance Use Topics  . Alcohol use: No  . Drug use: No     Allergies   Latex   Review of Systems Review of Systems  Constitutional: Positive for chills, fatigue and fever.  HENT: Negative.  Negative for congestion, sinus pain and sore throat.   Eyes: Positive for photophobia.  Respiratory: Positive for cough. Negative for shortness of breath.   Cardiovascular: Positive for chest pain.  Gastrointestinal: Negative.  Negative for abdominal pain, nausea and vomiting.  Genitourinary: Negative.  Negative for decreased urine volume and dysuria.  Musculoskeletal: Negative.  Negative for myalgias.  Skin: Negative.  Negative for rash.  Neurological: Positive for weakness and headaches.     Physical Exam Updated Vital Signs BP (!) 141/83 (BP Location: Right Arm)   Pulse (!) 115   Temp (!) 101.4 F (38.6 C) (Oral)   Resp 18   Ht 5\' 2"  (1.575 m)   Wt 81.6 kg (180 lb)   LMP  (LMP Unknown)   SpO2 100%   BMI 32.92 kg/m   Physical Exam  Constitutional: She is oriented to person, place, and time. She appears well-developed and well-nourished.  HENT:  Head: Normocephalic.  Right Ear: Tympanic membrane normal.  Left Ear: Tympanic membrane normal.  Eyes: Conjunctivae are normal.  Neck: Normal range of motion. Neck supple.  Cardiovascular: Normal rate and regular rhythm.  Pulmonary/Chest: Effort normal and breath sounds normal. She has no wheezes. She has no rales.  Abdominal:  Soft. Bowel sounds are normal. There is no tenderness. There is no rebound and no guarding.  Musculoskeletal: Normal range of motion.  Neurological: She is alert and oriented to person, place, and time.  CN's 3-12 grossly intact. Speech is clear and focused. No facial asymmetry. No lateralizing weakness. No deficits of coordination.   Skin: Skin is warm and dry. No rash noted.  Psychiatric: She has a normal mood and affect.     ED Treatments / Results  Labs (all labs ordered are listed, but only abnormal results are displayed) Labs Reviewed  COMPREHENSIVE METABOLIC PANEL - Abnormal; Notable for the following components:      Result Value   Potassium 3.2 (*)    Glucose, Bld 124 (*)    Creatinine, Ser 1.10 (*)    All other components within normal limits  CBC WITH DIFFERENTIAL/PLATELET - Abnormal; Notable for the following components:   WBC 11.5 (*)    Neutro Abs 9.6 (*)    All other components within normal limits  I-STAT CG4 LACTIC ACID, ED - Abnormal; Notable for the following components:   Lactic Acid, Venous 1.91 (*)    All other components within normal limits  CULTURE, BLOOD (ROUTINE X 2)  CULTURE, BLOOD (ROUTINE X 2)  PROTIME-INR  URINALYSIS, ROUTINE W REFLEX MICROSCOPIC  I-STAT BETA HCG BLOOD, ED ([facility], [facility], [facility] ONLY)  I-STAT CG4 LACTIC ACID, ED    EKG None  Radiology Dg Chest 2 View  Result Date: 02/19/2018 CLINICAL DATA:  Fever, chills and headache with chest pain. EXAM: CHEST - 2 VIEW COMPARISON:  12/26/2016, 12/21/2016 FINDINGS: The heart size and mediastinal contours are within normal limits. Both lungs are clear. No acute osseous abnormality. IMPRESSION: No active cardiopulmonary disease. Electronically Signed   By: Ashley Royalty M.D.   On: 02/19/2018 21:57    Procedures Procedures (including critical care time)  Medications Ordered in ED Medications  sodium chloride 0.9 % bolus 1,000 mL (has no administration in time range)  albuterol (PROVENTIL) (2.5 MG/3ML)  0.083% nebulizer solution 5 mg (has no administration in time range)     Initial Impression / Assessment and Plan / ED Course  I have reviewed the triage  vital signs and the nursing notes.  Pertinent labs & imaging results that were available during my care of the patient were reviewed by me and considered in my medical decision making (see chart for details).    Patient presents with fever, cough and chest discomfort starting earlier today. Fever 101 at home, husband concerned because she was difficult to wake up.   Chart reviewed. HIV viral load <20 on 12/23/17, CD4 670. CXR clear, no evidence PNA. Albuterol provided for relief of chest discomfort and to help with cough. Patient easy to waken here and is alert and oriented.   Labs reassuring. Headache cocktail provided for relief of HA pain. Fluids also provided.   VS remain stable. She is feeling better on ED treatments provided. Albuterol nebulizer given with improvement to cough. Suspect viral process causing fever and malaise. No evidence PNA. Patient is not considered immunocompromised and can be discharged home with supportive care. Discussed with dr. Stark Jock who agrees with plan of care.   Final Clinical Impressions(s) / ED Diagnoses   Final diagnoses:  None   1. Viral syndrome 2. Febrile illness 3. headache ED Discharge Orders    None       Dennie Bible 02/20/18 8208    Lacretia Leigh, MD 02/20/18 2255

## 2018-02-19 NOTE — ED Triage Notes (Signed)
Pt reports gen body aches, fever/chills, HA X1 day. Noted to be lethargic and hard to arouse in triage. Febrile and tachycardic.  Hx HIV.

## 2018-02-20 DIAGNOSIS — B349 Viral infection, unspecified: Secondary | ICD-10-CM | POA: Diagnosis not present

## 2018-02-20 LAB — I-STAT CG4 LACTIC ACID, ED: LACTIC ACID, VENOUS: 1.2 mmol/L (ref 0.5–1.9)

## 2018-02-20 NOTE — Discharge Instructions (Signed)
You are stable for go home tonight with a probable viral illness causing fever and fatigue. Treat the fever with Tylenol and/or ibuprofen. Push fluids. Follow up with your doctor if symptoms persist and return with any any worsening symptoms or new concerns.

## 2018-02-24 ENCOUNTER — Encounter (HOSPITAL_COMMUNITY): Payer: Self-pay

## 2018-02-24 ENCOUNTER — Ambulatory Visit (HOSPITAL_COMMUNITY)
Admission: EM | Admit: 2018-02-24 | Discharge: 2018-02-24 | Disposition: A | Discharge status: Home / Self Care | Payer: Medicare Other | Attending: Internal Medicine | Admitting: Internal Medicine

## 2018-02-24 ENCOUNTER — Telehealth: Payer: Self-pay

## 2018-02-24 DIAGNOSIS — M549 Dorsalgia, unspecified: Secondary | ICD-10-CM | POA: Insufficient documentation

## 2018-02-24 DIAGNOSIS — R509 Fever, unspecified: Secondary | ICD-10-CM | POA: Diagnosis not present

## 2018-02-24 DIAGNOSIS — K219 Gastro-esophageal reflux disease without esophagitis: Secondary | ICD-10-CM | POA: Insufficient documentation

## 2018-02-24 DIAGNOSIS — G8929 Other chronic pain: Secondary | ICD-10-CM | POA: Insufficient documentation

## 2018-02-24 DIAGNOSIS — F411 Generalized anxiety disorder: Secondary | ICD-10-CM | POA: Insufficient documentation

## 2018-02-24 DIAGNOSIS — J029 Acute pharyngitis, unspecified: Secondary | ICD-10-CM | POA: Diagnosis present

## 2018-02-24 DIAGNOSIS — B20 Human immunodeficiency virus [HIV] disease: Secondary | ICD-10-CM | POA: Diagnosis not present

## 2018-02-24 DIAGNOSIS — R7303 Prediabetes: Secondary | ICD-10-CM | POA: Insufficient documentation

## 2018-02-24 DIAGNOSIS — J45909 Unspecified asthma, uncomplicated: Secondary | ICD-10-CM | POA: Insufficient documentation

## 2018-02-24 DIAGNOSIS — G43909 Migraine, unspecified, not intractable, without status migrainosus: Secondary | ICD-10-CM | POA: Diagnosis not present

## 2018-02-24 DIAGNOSIS — Z79899 Other long term (current) drug therapy: Secondary | ICD-10-CM | POA: Diagnosis not present

## 2018-02-24 DIAGNOSIS — F329 Major depressive disorder, single episode, unspecified: Secondary | ICD-10-CM | POA: Insufficient documentation

## 2018-02-24 DIAGNOSIS — B349 Viral infection, unspecified: Secondary | ICD-10-CM

## 2018-02-24 DIAGNOSIS — R070 Pain in throat: Secondary | ICD-10-CM

## 2018-02-24 LAB — CULTURE, BLOOD (ROUTINE X 2)
Culture: NO GROWTH
Culture: NO GROWTH
SPECIAL REQUESTS: ADEQUATE

## 2018-02-24 LAB — POCT RAPID STREP A: STREPTOCOCCUS, GROUP A SCREEN (DIRECT): NEGATIVE

## 2018-02-24 MED ORDER — CETIRIZINE HCL 10 MG PO TABS: 10.0000 mg | ORAL_TABLET | Freq: Every day | ORAL | 0 refills | Status: DC

## 2018-02-24 MED ORDER — FLUTICASONE PROPIONATE 50 MCG/ACT NA SUSP: 2.0000 | Freq: Every day | NASAL | 0 refills | Status: DC

## 2018-02-24 MED ORDER — PULMICORT FLEXHALER 90 MCG/ACT IN AEPB: 2.0000 | INHALATION_SPRAY | Freq: Two times a day (BID) | RESPIRATORY_TRACT | 0 refills | Status: DC

## 2018-02-24 MED ORDER — DOXYCYCLINE HYCLATE 100 MG PO CAPS: 100.0000 mg | ORAL_CAPSULE | Freq: Two times a day (BID) | ORAL | 0 refills | Status: DC

## 2018-02-24 MED ORDER — IPRATROPIUM BROMIDE 0.06 % NA SOLN: 2.0000 | Freq: Four times a day (QID) | NASAL | 0 refills | Status: DC

## 2018-02-24 MED ORDER — BENZONATATE 100 MG PO CAPS: 100.0000 mg | ORAL_CAPSULE | Freq: Three times a day (TID) | ORAL | 0 refills | Status: DC

## 2018-02-24 NOTE — ED Triage Notes (Signed)
Pt states that starting a few days ago she started having pain and swelling with her throat, headache, chills, fever, and loosing her voice and it is progressively getting worse.

## 2018-02-24 NOTE — ED Provider Notes (Signed)
[facility]    CSN: [id number] Arrival date & time: 02/24/18  1003     History   Chief Complaint Chief Complaint  Patient presents with  . Sore Throat    HPI Danielle [name] is a 47 y.o. female.   47 year old female with history of asthma, chronic back pain, HIV, prediabetes comes in for 5-day history of URI symptoms.  States she was seen in the emergency department a few days ago, and was told it was a viral illness.  At that time, she was told to take ibuprofen for fever, and has continued.  T-max 101.5, last dose of antipyretics yesterday.  She is continued with productive cough, sneezing, rhinorrhea, nasal congestion.  Now with sore throat, loss of voice, frontal headache.  Had eye crusting this morning.  States when symptoms first started, try to take DayQuil and NyQuil, but has stopped because it has not improved.  Has not taken anything else for the symptoms.  Last viral load less than 20.     Past Medical History:  Diagnosis Date  . Anemia   . Anxiety   . Asthma   . Bulging lumbar disc   . Chronic back pain   . Depression   . Genital HSV   . GERD (gastroesophageal reflux disease)   . Hepatitis    States she got a shot for this  . HIV infection ([place])   . Migraines   . Pneumonia   . Pre-diabetes     Patient Active Problem List   Diagnosis Date Noted  . Impingement syndrome of right ankle   . Sprain of calcaneofibular ligament of right ankle 07/04/2017  . Ankle pain, right 06/24/2017  . Hyperglycemia 02/18/2017  . Hepatitis B immune 02/18/2017  . 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 S/P total abdominal hysterectomy 06/28/2015  . Abnormal uterine bleeding (AUB) 05/03/2015  . Condyloma acuminatum 02/23/2015  . Fibroids 01/05/2015  . Urinary incontinence 01/05/2015  . Abnormal finding on GI tract imaging 10/12/2013  . Anemia-chronic 10/09/2013  . Achalasia s/p laparoscopic Heller myotomy and Dor fundoplication 09/47/0962  . Ganglion cyst of  finger of right hand 07/06/2013  . Trichomoniasis of vagina 06/03/2013  . GERD (gastroesophageal reflux disease) 03/16/2013  . Depression 02/10/2009  . Human immunodeficiency virus (HIV) disease ([place]) 03/24/2007  . Anxiety state 03/24/2007  . ASTHMA 03/24/2007    Past Surgical History:  Procedure Laterality Date  . ABDOMINAL HYSTERECTOMY N/A 06/28/2015   Procedure: HYSTERECTOMY ABDOMINAL;  Surgeon: Osborne Oman, MD;  Location: [facility];  Service: Gynecology;  Laterality: N/A;  Requested 06/28/15 @ 7:30a  . ANKLE ARTHROSCOPY Right 10/16/2017   Procedure: RIGHT ANKLE ARTHROSCOPIC DEBRIDEMENT;  Surgeon: Newt Minion, MD;  Location: [facility];  Service: Orthopedics;  Laterality: Right;  . BILATERAL SALPINGECTOMY Bilateral 06/28/2015   Procedure: BILATERAL SALPINGECTOMY;  Surgeon: Osborne Oman, MD;  Location: [facility];  Service: Gynecology;  Laterality: Bilateral;  . CESAREAN SECTION     twice;  vertical incision x 2  . COLONOSCOPY N/A 10/12/2013   Procedure: COLONOSCOPY;  Surgeon: Jerene Bears, MD;  [facility];  Service: Endoscopy;  Laterality: N/A;  . ESOPHAGEAL MANOMETRY N/A 06/29/2013   Procedure: ESOPHAGEAL MANOMETRY (EM);  Surgeon: Jerene Bears, MD;  [facility];  Service: Gastroenterology;  Laterality: N/A;  . HELLER MYOTOMY N/A 10/08/2013   Procedure: LAPAROSCOPIC HELLER MYOTOMY, DOR FUNDIPLICATION, UPPER ENDOSCOPY;  Surgeon: Odis Hollingshead, MD;  Location: [facility];  Service: General;  Laterality: N/A;  . HERNIA REPAIR    . HYSTEROSCOPY WITH RESECTOSCOPE N/A 05/03/2015   Procedure: Diagnostic Hysteroscopy;  Surgeon: Lavonia Drafts, MD;  Location: [facility];  Service: Gynecology;  Laterality: N/A;  wants Novasure and myosure.  Marland Kitchen LYSIS OF ADHESION  06/28/2015   Procedure: LYSIS OF ADHESION;  Surgeon: Osborne Oman, MD;  Location: [facility];  Service: Gynecology;;  . OTHER SURGICAL HISTORY     biopsy axillary ,right arm  . right arm biopsy    . UPPER GI  ENDOSCOPY N/A 10/08/2013   Procedure: UPPER GI ENDOSCOPY;  Surgeon: Odis Hollingshead, MD;  Location[facility];  Service: General;  Laterality: N/A;  . WISDOM TOOTH EXTRACTION      OB History    Gravida  5   Para  3   Term  3   Preterm      AB  2   Living  3     SAB  2   TAB      Ectopic      Multiple      Live Births               Home Medications    Prior to Admission medications   Medication Sig Start Date End Date Taking? Authorizing Provider  bictegravir-emtricitabine-tenofovir AF (BIKTARVY) 50-200-25 MG TABS tablet Take 1 tablet by mouth daily. 12/23/17  Yes Campbell Riches, MD  cyclobenzaprine (FLEXERIL) 5 MG tablet Take 1 tablet (5 mg total) by mouth 2 (two) times daily as needed for muscle spasms. 02/15/18  Yes Glendell Docker, NP  DULoxetine (CYMBALTA) 30 MG capsule Take 30 mg by mouth at bedtime.   Yes [provider]  ibuprofen (ADVIL,MOTRIN) 600 MG tablet Take 1 tablet (600 mg total) by mouth every 6 (six) hours as needed. 02/15/18  Yes Glendell Docker, NP  omeprazole (PRILOSEC) 20 MG capsule Take 1 capsule (20 mg total) by mouth daily. 12/23/17  Yes Campbell Riches, MD  oxybutynin (DITROPAN-XL) 10 MG 24 hr tablet Take 10 mg by mouth daily.   Yes [provider]  PROAIR HFA 108 (90 BASE) MCG/ACT inhaler Inhale 2 puffs into the lungs every 4 (four) hours as needed for wheezing or shortness of breath. Shortness of breath/wheezing 12/31/14  Yes [provider]  SUMAtriptan (IMITREX) 100 MG tablet Take 1 tablet (100 mg total) by mouth every 2 (two) hours as needed for migraine. May repeat in 2 hours if headache persists or recurs. 01/02/17  Yes Lysbeth Penner, FNP  valACYclovir (VALTREX) 500 MG tablet TAKE 1 TABLET(500 MG) BY MOUTH DAILY 01/02/18  Yes Tommy Medal, Lavell Islam, MD  benzonatate (TESSALON) 100 MG capsule Take 1 capsule (100 mg total) by mouth every 8 (eight) hours. 02/24/18   Tasia Catchings, Pavan Bring V, PA-C  cetirizine (ZYRTEC) 10 MG  tablet Take 1 tablet (10 mg total) by mouth daily. 02/24/18   Tasia Catchings, Celia Friedland V, PA-C  doxycycline (VIBRAMYCIN) 100 MG capsule Take 1 capsule (100 mg total) by mouth 2 (two) times daily. Fill on 6/22 if symptoms not improving. 03/01/18   Tasia Catchings, Demetrious Rainford V, PA-C  fluticasone (FLONASE) 50 MCG/ACT nasal spray Place 2 sprays into both nostrils daily. 02/24/18   Tasia Catchings, Haly Feher V, PA-C  ipratropium (ATROVENT) 0.06 % nasal spray Place 2 sprays into both nostrils 4 (four) times daily. 02/24/18   Tasia Catchings, Georgiana Spillane V, PA-C  PULMICORT FLEXHALER 90 MCG/ACT inhaler Inhale 2 puffs into the lungs 2 (two) times daily. 02/24/18   Tasia Catchings,  Arland Usery V, PA-C    Family History Family History  Problem Relation Age of Onset  . Hyperlipidemia Mother   . Diabetes Mother   . Stroke Mother   . Diabetes Brother   . Colon cancer Father   . Esophageal cancer Neg Hx   . Rectal cancer Neg Hx   . Stomach cancer Neg Hx     Social History Social History   Tobacco Use  . Smoking status: Never Smoker  . Smokeless tobacco: Never Used  Substance Use Topics  . Alcohol use: No  . Drug use: No     Allergies   Latex   Review of Systems Review of Systems  Reason unable to perform ROS: See HPI as above.     Physical Exam Triage Vital Signs ED Triage Vitals  Enc Vitals Group     BP 02/24/18 1025 121/85     Pulse --      Resp 02/24/18 1025 20     Temp 02/24/18 1025 99.1 F (37.3 C)     Temp Source 02/24/18 1025 Oral     SpO2 02/24/18 1025 98 %     Weight --      Height --      Head Circumference --      Peak Flow --      Pain Score 02/24/18 1022 7     Pain Loc --      Pain Edu? --      Excl. in [place]? --    No data found.  Updated Vital Signs BP 121/85 (BP Location: Right Arm)   Temp 99.1 F (37.3 C) (Oral)   Resp 20   LMP  (LMP Unknown)   SpO2 98%   Physical Exam  Constitutional: She is oriented to person, place, and time. She appears well-developed and well-nourished. No distress.  HENT:  Head: Normocephalic and atraumatic.  Right Ear:  External ear and ear canal normal. Tympanic membrane is erythematous. Tympanic membrane is not bulging.  Left Ear: External ear and ear canal normal. Tympanic membrane is erythematous. Tympanic membrane is not bulging.  Nose: Mucosal edema and rhinorrhea present. Right sinus exhibits no maxillary sinus tenderness and no frontal sinus tenderness. Left sinus exhibits no maxillary sinus tenderness and no frontal sinus tenderness.  Mouth/Throat: Uvula is midline and mucous membranes are normal. Posterior oropharyngeal erythema present. No tonsillar exudate.  Eyes: Pupils are equal, round, and reactive to light. Conjunctivae are normal.  Neck: Normal range of motion. Neck supple.  Cardiovascular: Normal rate, regular rhythm and normal heart sounds. Exam reveals no gallop and no friction rub.  No murmur heard. Pulmonary/Chest: Effort normal and breath sounds normal. She has no decreased breath sounds. She has no wheezes. She has no rhonchi. She has no rales.  Lymphadenopathy:    She has no cervical adenopathy.  Neurological: She is alert and oriented to person, place, and time.  Skin: Skin is warm and dry.  Psychiatric: She has a normal mood and affect. Her behavior is normal. Judgment normal.     UC Treatments / Results  Labs (all labs ordered are listed, but only abnormal results are displayed) Labs Reviewed  CULTURE, GROUP A STREP [facility])  POCT RAPID STREP A    EKG None  Radiology No results found.  Procedures Procedures (including critical care time)  Medications Ordered in UC Medications - No data to display  Initial Impression / Assessment and Plan / UC Course  I have reviewed the triage vital signs  and the nursing notes.  Pertinent labs & imaging results that were available during my care of the patient were reviewed by me and considered in my medical decision making (see chart for details).    Discussed with patient history and exam most consistent with viral URI.  Symptomatic treatment as needed. Push fluids.  Rx of doxycycline sent to pharmacy, patient can  fill in 5 days if symptoms not improving for sinusitis.  Return precautions given.   Final Clinical Impressions(s) / UC Diagnoses   Final diagnoses:  Viral illness    ED Prescriptions    Medication Sig Dispense Auth. Provider   PULMICORT FLEXHALER 90 MCG/ACT inhaler Inhale 2 puffs into the lungs 2 (two) times daily. 1 Inhaler Abdulaziz Toman V, PA-C   fluticasone (FLONASE) 50 MCG/ACT nasal spray Place 2 sprays into both nostrils daily. 1 g Angelisa Winthrop V, PA-C   ipratropium (ATROVENT) 0.06 % nasal spray Place 2 sprays into both nostrils 4 (four) times daily. 15 mL Xzavien Harada V, PA-C   benzonatate (TESSALON) 100 MG capsule Take 1 capsule (100 mg total) by mouth every 8 (eight) hours. 21 capsule Tasia Catchings, Aveer Bartow V, PA-C   doxycycline (VIBRAMYCIN) 100 MG capsule Take 1 capsule (100 mg total) by mouth 2 (two) times daily. Fill on 6/22 if symptoms not improving. 20 capsule Sashay Felling V, PA-C   cetirizine (ZYRTEC) 10 MG tablet Take 1 tablet (10 mg total) by mouth daily. 15 tablet Tobin Chad, [place] 02/24/18 1106

## 2018-02-24 NOTE — Discharge Instructions (Addendum)
Rapid strep negative. Symptoms are most likely due to viral illness/ drainage down your throat. Tessalon for cough. Start flonase, atrovent nasal spray, zyrtec for nasal congestion/drainage. You can use over the counter nasal saline rinse such as neti pot for nasal congestion. Keep hydrated, your urine should be clear to pale yellow in color. Tylenol/motrin for fever and pain. Monitor for any worsening of symptoms, chest pain, shortness of breath, wheezing, swelling of the throat, follow up for reevaluation. Monitor for any worsening of symptoms, swelling of the throat, trouble breathing, trouble swallowing, leaning forward to breath, drooling, go to the emergency department for further evaluation needed.  For sore throat/cough try using a honey-based tea. Use 3 teaspoons of honey with juice squeezed from half lemon. Place shaved pieces of ginger into 1/2-1 cup of water and warm over stove top. Then mix the ingredients and repeat every 4 hours as needed.

## 2018-02-24 NOTE — Telephone Encounter (Signed)
PT walked in clinic today complaining about chills, fever, sore throat, pus leaking in both eyes, loosing voice. Pt stated she was seen in ED 02/20/18 states that her symptoms have worsen. Pt stated that her headache is getting worse she has been taking Imitrex and Ibuprofen to help with the head pain. Pt was hoping to see Dr. Johnnye Sima in clinic today, however, MD is not in the office this week. Stated to pt that I would route message to MD to see what MD would like to do. Stated to pt if she feels like her symptoms are getting worse to go to urgent care to be seen.  [place]

## 2018-02-25 ENCOUNTER — Ambulatory Visit (HOSPITAL_COMMUNITY)
Admission: EM | Admit: 2018-02-25 | Discharge: 2018-02-25 | Disposition: A | Discharge status: Home / Self Care | Payer: Medicare Other | Attending: Internal Medicine | Admitting: Internal Medicine

## 2018-02-25 ENCOUNTER — Other Ambulatory Visit: Payer: Self-pay

## 2018-02-25 ENCOUNTER — Encounter (HOSPITAL_COMMUNITY): Payer: Self-pay | Admitting: Emergency Medicine

## 2018-02-25 DIAGNOSIS — H1033 Unspecified acute conjunctivitis, bilateral: Secondary | ICD-10-CM | POA: Diagnosis not present

## 2018-02-25 MED ORDER — POLYMYXIN B-TRIMETHOPRIM 10000-0.1 UNIT/ML-% OP SOLN: 1.0000 [drp] | OPHTHALMIC | 0 refills | Status: AC

## 2018-02-25 MED ORDER — AMOXICILLIN-POT CLAVULANATE 875-125 MG PO TABS: 1.0000 | ORAL_TABLET | Freq: Two times a day (BID) | ORAL | 0 refills | Status: AC

## 2018-02-25 NOTE — ED Triage Notes (Signed)
The patient presented to the [facility] with family with a complaint of bilateral eye itching and drainage that started this am.

## 2018-02-25 NOTE — ED Provider Notes (Addendum)
[place]    CSN: [id number] Arrival date & time: 02/25/18  1225     History   Chief Complaint Chief Complaint  Patient presents with  . Eye Drainage    HPI Danielle [name] is a 47 y.o. female presenting today for evaluation of bilateral eye redness and drainage.  States that she woke up this morning with discharge and swelling around her eyes, the swelling has decreased since this morning.  States that she has irritation and pain to both eyes.  Denies change in vision, does endorse occasional blurriness that clears with blinking.  Wears glasses, denies contact use.  Patient is also had URI symptoms for approximately 6 days.  She has been using nasal sprays, over-the-counter symptomatic management without relief.  HPI  Past Medical History:  Diagnosis Date  . Anemia   . Anxiety   . Asthma   . Bulging lumbar disc   . Chronic back pain   . Depression   . Genital HSV   . GERD (gastroesophageal reflux disease)   . Hepatitis    States she got a shot for this  . HIV infection ([place])   . Migraines   . Pneumonia   . Pre-diabetes     Patient Active Problem List   Diagnosis Date Noted  . Impingement syndrome of right ankle   . Sprain of calcaneofibular ligament of right ankle 07/04/2017  . Ankle pain, right 06/24/2017  . Hyperglycemia 02/18/2017  . Hepatitis B immune 02/18/2017  . 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 S/P total abdominal hysterectomy 06/28/2015  . Abnormal uterine bleeding (AUB) 05/03/2015  . Condyloma acuminatum 02/23/2015  . Fibroids 01/05/2015  . Urinary incontinence 01/05/2015  . Abnormal finding on GI tract imaging 10/12/2013  . Anemia-chronic 10/09/2013  . Achalasia s/p laparoscopic Heller myotomy and Dor fundoplication 63/09/6008  . Ganglion cyst of finger of right hand 07/06/2013  . Trichomoniasis of vagina 06/03/2013  . GERD (gastroesophageal reflux disease) 03/16/2013  . Depression 02/10/2009  . Human immunodeficiency virus  (HIV) disease ([place]) 03/24/2007  . Anxiety state 03/24/2007  . ASTHMA 03/24/2007    Past Surgical History:  Procedure Laterality Date  . ABDOMINAL HYSTERECTOMY N/A 06/28/2015   Procedure: HYSTERECTOMY ABDOMINAL;  Surgeon: Osborne Oman, MD;  Location: [facility];  Service: Gynecology;  Laterality: N/A;  Requested 06/28/15 @ 7:30a  . ANKLE ARTHROSCOPY Right 10/16/2017   Procedure: RIGHT ANKLE ARTHROSCOPIC DEBRIDEMENT;  Surgeon: Newt Minion, MD;  Location: [facility];  Service: Orthopedics;  Laterality: Right;  . BILATERAL SALPINGECTOMY Bilateral 06/28/2015   Procedure: BILATERAL SALPINGECTOMY;  Surgeon: Osborne Oman, MD;  Location: [facility];  Service: Gynecology;  Laterality: Bilateral;  . CESAREAN SECTION     twice;  vertical incision x 2  . COLONOSCOPY N/A 10/12/2013   Procedure: COLONOSCOPY;  Surgeon: Jerene Bears, MD;  [facility]: [facility];  Service: Endoscopy;  Laterality: N/A;  . ESOPHAGEAL MANOMETRY N/A 06/29/2013   Procedure: ESOPHAGEAL MANOMETRY (EM);  Surgeon: Jerene Bears, MD;  [facility]: [facility];  Service: Gastroenterology;  Laterality: N/A;  . HELLER MYOTOMY N/A 10/08/2013   Procedure: LAPAROSCOPIC HELLER MYOTOMY, DOR FUNDIPLICATION, UPPER ENDOSCOPY;  Surgeon: Odis Hollingshead, MD;  Location: [facility];  Service: General;  Laterality: N/A;  . HERNIA REPAIR    . HYSTEROSCOPY WITH RESECTOSCOPE N/A 05/03/2015   Procedure: Diagnostic Hysteroscopy;  Surgeon: Lavonia Drafts, MD;  Location: [facility];  Service: Gynecology;  Laterality: N/A;  wants Novasure and myosure.  Marland Kitchen LYSIS OF  ADHESION  06/28/2015   Procedure: LYSIS OF ADHESION;  Surgeon: Osborne Oman, MD;  Location: [facility];  Service: Gynecology;;  . OTHER SURGICAL HISTORY     biopsy axillary ,right arm  . right arm biopsy    . UPPER GI ENDOSCOPY N/A 10/08/2013   Procedure: UPPER GI ENDOSCOPY;  Surgeon: Odis Hollingshead, MD;  [facility]: [facility];  Service: General;  Laterality: N/A;  . WISDOM TOOTH EXTRACTION      OB  History    Gravida  5   Para  3   Term  3   Preterm      AB  2   Living  3     SAB  2   TAB      Ectopic      Multiple      Live Births               Home Medications    Prior to Admission medications   Medication Sig Start Date End Date Taking? Authorizing Provider  bictegravir-emtricitabine-tenofovir AF (BIKTARVY) 50-200-25 MG TABS tablet Take 1 tablet by mouth daily. 12/23/17   Campbell Riches, MD  cetirizine (ZYRTEC) 10 MG tablet Take 1 tablet (10 mg total) by mouth daily. 02/24/18   Tasia Catchings, Amy V, PA-C  doxycycline (VIBRAMYCIN) 100 MG capsule Take 1 capsule (100 mg total) by mouth 2 (two) times daily. Fill on 6/22 if symptoms not improving. 03/01/18   Tasia Catchings, Amy V, PA-C  DULoxetine (CYMBALTA) 30 MG capsule Take 30 mg by mouth at bedtime.    [provider]  fluticasone (FLONASE) 50 MCG/ACT nasal spray Place 2 sprays into both nostrils daily. 02/24/18   Tasia Catchings, Amy V, PA-C  ibuprofen (ADVIL,MOTRIN) 600 MG tablet Take 1 tablet (600 mg total) by mouth every 6 (six) hours as needed. 02/15/18   Glendell Docker, NP  ipratropium (ATROVENT) 0.06 % nasal spray Place 2 sprays into both nostrils 4 (four) times daily. 02/24/18   Tasia Catchings, Amy V, PA-C  omeprazole (PRILOSEC) 20 MG capsule Take 1 capsule (20 mg total) by mouth daily. 12/23/17   Campbell Riches, MD  oxybutynin (DITROPAN-XL) 10 MG 24 hr tablet Take 10 mg by mouth daily.    [provider]  PROAIR HFA 108 (90 BASE) MCG/ACT inhaler Inhale 2 puffs into the lungs every 4 (four) hours as needed for wheezing or shortness of breath. Shortness of breath/wheezing 12/31/14   [provider]  PULMICORT FLEXHALER 90 MCG/ACT inhaler Inhale 2 puffs into the lungs 2 (two) times daily. 02/24/18   Tasia Catchings, Amy V, PA-C  SUMAtriptan (IMITREX) 100 MG tablet Take 1 tablet (100 mg total) by mouth every 2 (two) hours as needed for migraine. May repeat in 2 hours if headache persists or recurs. 01/02/17   Lysbeth Penner, FNP    trimethoprim-polymyxin b (POLYTRIM) ophthalmic solution Place 1 drop into both eyes every 4 (four) hours for 7 days. 02/25/18 03/04/18  Shantinique Picazo, Elesa Hacker, PA-C  valACYclovir (VALTREX) 500 MG tablet TAKE 1 TABLET(500 MG) BY MOUTH DAILY 01/02/18   Tommy Medal, Lavell Islam, MD    Family History Family History  Problem Relation Age of Onset  . Hyperlipidemia Mother   . Diabetes Mother   . Stroke Mother   . Diabetes Brother   . Colon cancer Father   . Esophageal cancer Neg Hx   . Rectal cancer Neg Hx   . Stomach cancer Neg Hx     Social History Social History  Tobacco Use  . Smoking status: Never Smoker  . Smokeless tobacco: Never Used  Substance Use Topics  . Alcohol use: No  . Drug use: No     Allergies   Latex   Review of Systems Review of Systems  Constitutional: Negative for chills, fatigue and fever.  HENT: Positive for congestion, ear pain, rhinorrhea, sinus pressure, sore throat and voice change. Negative for trouble swallowing.   Eyes: Positive for pain, discharge and redness. Negative for photophobia, itching and visual disturbance.  Respiratory: Positive for cough. Negative for chest tightness and shortness of breath.   Cardiovascular: Negative for chest pain.  Gastrointestinal: Negative for abdominal pain, nausea and vomiting.  Musculoskeletal: Negative for myalgias.  Skin: Negative for rash.  Neurological: Negative for dizziness, light-headedness and headaches.     Physical Exam Triage Vital Signs ED Triage Vitals  Enc Vitals Group     BP 02/25/18 1242 120/72     Pulse Rate 02/25/18 1242 89     Resp 02/25/18 1242 16     Temp 02/25/18 1242 98.8 F (37.1 C)     Temp Source 02/25/18 1242 Oral     SpO2 02/25/18 1242 100 %     Weight --      Height --      Head Circumference --      Peak Flow --      Pain Score 02/25/18 1243 7     Pain Loc --      Pain Edu? --      Excl. in [place]? --    No data found.  Updated Vital Signs BP 120/72 (BP Location: Left  Arm)   Pulse 89   Temp 98.8 F (37.1 C) (Oral)   Resp 16   LMP  (LMP Unknown)   SpO2 100%   Visual Acuity Right Eye Distance:  20/40 Left Eye Distance:  20/30 Bilateral Distance:  20/25  Right Eye Near:   Left Eye Near:    Bilateral Near:     Physical Exam  Constitutional: She appears well-developed and well-nourished. No distress.  HENT:  Head: Normocephalic and atraumatic.  Bilateral ears without tenderness to palpation of external auricle, tragus and mastoid, EAC's without erythema or swelling, TM's with good bony landmarks and cone of light.  Slightly erythematous.  Oral mucosa pink and moist, mild tonsillar enlargement no exudate. Posterior pharynx patent and erythematous, no uvula deviation or swelling.  Voice sounds hoarse  Eyes: Pupils are equal, round, and reactive to light. Conjunctivae and EOM are normal.  Lateral conjunctiva erythematous, thick discharge present in medial canthus of right eye, no foreign bodies visualized on eversion.  Neck: Neck supple.  Cardiovascular: Normal rate and regular rhythm.  No murmur heard. Pulmonary/Chest: Effort normal and breath sounds normal. No respiratory distress.  Breathing comfortably at rest, CTABL, no wheezing, rales or other adventitious sounds auscultated   Abdominal: Soft. There is no tenderness.  Musculoskeletal: She exhibits no edema.  Neurological: She is alert.  Skin: Skin is warm and dry.  Psychiatric: She has a normal mood and affect.  Nursing note and vitals reviewed.    UC Treatments / Results  Labs (all labs ordered are listed, but only abnormal results are displayed) Labs Reviewed - No data to display  EKG None  Radiology No results found.  Procedures Procedures (including critical care time)  Medications Ordered in UC Medications - No data to display  Initial Impression / Assessment and Plan / UC Course  I have reviewed  the triage vital signs and the nursing notes.  Pertinent labs & imaging  results that were available during my care of the patient were reviewed by me and considered in my medical decision making (see chart for details).    Patient with bilateral bacterial conjunctivitis, versus viral.  We will treat with Polytrim eyedrops.  Also provided patient with printed prescription for Augmentin to fill on Friday if URI symptoms not improving.Discussed strict return precautions. Patient verbalized understanding and is agreeable with plan.  Final Clinical Impressions(s) / UC Diagnoses   Final diagnoses:  Acute bacterial conjunctivitis of both eyes     Discharge Instructions     Please use Polytrim eyedrops every 4 hours in both eyes Cool compresses to remove any drainage as well as help with discomfort Please have good hand hygiene  Please return if symptoms worsening, developing pain, changes in vision, swelling around eyes, not improving with treatment in 5 to 7 days.    ED Prescriptions    Medication Sig Dispense Auth. Provider   trimethoprim-polymyxin b (POLYTRIM) ophthalmic solution Place 1 drop into both eyes every 4 (four) hours for 7 days. 10 mL Jaxzen Vanhorn C, PA-C     Controlled Substance Prescriptions [place] Controlled Substance Registry consulted? Not Applicable   Janith Lima, PA-C 02/25/18 1343    Janith Lima, [place] 02/25/18 1343

## 2018-02-25 NOTE — Discharge Instructions (Signed)
Please use Polytrim eyedrops every 4 hours in both eyes Cool compresses to remove any drainage as well as help with discomfort Please have good hand hygiene  Please return if symptoms worsening, developing pain, changes in vision, swelling around eyes, not improving with treatment in 5 to 7 days.

## 2018-02-27 LAB — CULTURE, GROUP A STREP (THRC)

## 2018-02-27 NOTE — Telephone Encounter (Signed)
She should be seen in urgent care thanks

## 2018-03-05 DIAGNOSIS — M6281 Muscle weakness (generalized): Secondary | ICD-10-CM | POA: Diagnosis not present

## 2018-03-05 DIAGNOSIS — M25671 Stiffness of right ankle, not elsewhere classified: Secondary | ICD-10-CM | POA: Diagnosis not present

## 2018-03-05 DIAGNOSIS — M25571 Pain in right ankle and joints of right foot: Secondary | ICD-10-CM | POA: Diagnosis not present

## 2018-03-05 DIAGNOSIS — R262 Difficulty in walking, not elsewhere classified: Secondary | ICD-10-CM | POA: Diagnosis not present

## 2018-03-07 DIAGNOSIS — F3132 Bipolar disorder, current episode depressed, moderate: Secondary | ICD-10-CM | POA: Diagnosis not present

## 2018-03-07 DIAGNOSIS — F411 Generalized anxiety disorder: Secondary | ICD-10-CM | POA: Diagnosis not present

## 2018-03-11 ENCOUNTER — Encounter (INDEPENDENT_AMBULATORY_CARE_PROVIDER_SITE_OTHER): Payer: Self-pay | Admitting: Orthopedic Surgery

## 2018-03-11 ENCOUNTER — Ambulatory Visit (INDEPENDENT_AMBULATORY_CARE_PROVIDER_SITE_OTHER): Payer: Self-pay

## 2018-03-11 ENCOUNTER — Ambulatory Visit (INDEPENDENT_AMBULATORY_CARE_PROVIDER_SITE_OTHER): Payer: Medicare Other | Admitting: Orthopedic Surgery

## 2018-03-11 DIAGNOSIS — M25571 Pain in right ankle and joints of right foot: Secondary | ICD-10-CM

## 2018-03-11 DIAGNOSIS — M2021 Hallux rigidus, right foot: Secondary | ICD-10-CM

## 2018-03-11 DIAGNOSIS — M6701 Short Achilles tendon (acquired), right ankle: Secondary | ICD-10-CM | POA: Diagnosis not present

## 2018-03-11 NOTE — Progress Notes (Signed)
Office Visit Note   Patient: Danielle [name]           Date of Birth: 27-Apr-1971           MRN: [id number] Visit Date: 03/11/2018              Requested by: Campbell Riches, MD [address] [place], [place] [address] PCP: Campbell Riches, MD  Chief Complaint  Patient presents with  . Right Ankle - Pain  . Right Foot - Pain      HPI: Patient is a 47 year old woman who presents complaining of pain primarily beneath the first metatarsal head which radiates proximally.  She has pain with start up pain as the day progresses.  She is working with physical therapy on her foot and ankle.  She is 5 months out from right ankle arthroscopy with debridement.  Assessment & Plan: Visit Diagnoses:  1. Pain in right ankle and joints of right foot   2. Achilles tendon contracture, right   3. Hallux rigidus, right foot     Plan: Patient was given instructions for therapy to work on Achilles stretching.  Recommended a stiff soled Trail running sneaker to unload pressure from the first metatarsal head.  Discussed with failure conservative care surgical options would need to be a fusion or a cheilectomy.  Follow-Up Instructions: Return in about 1 month (around 04/08/2018).   Ortho Exam  Patient is alert, oriented, no adenopathy, well-dressed, normal affect, normal respiratory effort. Examination patient has good pulses she has dorsiflexion just past neutral she has significant hallux rigidus with dorsiflexion of about 20 degrees of the right great toe.  There is palpable bony spurs.  Radiographs do not show any significant arthritic changes of the joint.  Patient may benefit from a cheilectomy if necessary.  Imaging: Xr Ankle 2 Views Right  Result Date: 03/11/2018 2 view radiographs of the right ankle shows a congruent mortise no osteochondral defect no joint space narrowing.  Xr Foot 2 Views Right  Result Date: 03/11/2018 2 view radiographs of the right foot shows no joint  space narrowing no fractures no Lisfranc complex widening.  No images are attached to the encounter.  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 04/27/2015     Lab Results  Component Value Date   ALBUMIN 4.1 02/19/2018   ALBUMIN 3.9 10/16/2017   ALBUMIN 3.9 01/17/2017    There is no height or weight on file to calculate BMI.  Orders:  Orders Placed This Encounter  Procedures  . XR Foot 2 Views Right  . XR Ankle 2 Views Right   No orders of the defined types were placed in this encounter.    Procedures: No procedures performed  Clinical Data: No additional findings.  ROS:  All other systems negative, except as noted in the HPI. Review of Systems  Objective: Vital Signs: LMP  (LMP Unknown)   Specialty Comments:  No specialty comments available.  PMFS History: Patient Active Problem List   Diagnosis Date Noted  . Hallux rigidus, right foot 03/11/2018  . 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 Impingement syndrome of right ankle   . Sprain of calcaneofibular ligament of right ankle 07/04/2017  . Ankle pain, right 06/24/2017  . Hyperglycemia 02/18/2017  .  Hepatitis B immune 02/18/2017  . 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 S/P total abdominal hysterectomy 06/28/2015  . Abnormal uterine bleeding (AUB) 05/03/2015  . Condyloma acuminatum 02/23/2015  . Fibroids 01/05/2015  . Urinary incontinence 01/05/2015  . Abnormal finding on GI tract imaging 10/12/2013  . Anemia-chronic 10/09/2013  . Achalasia s/p laparoscopic Heller myotomy and Dor fundoplication 54/00/8676  . Ganglion cyst of finger of right hand 07/06/2013  . Trichomoniasis of vagina 06/03/2013  . GERD (gastroesophageal reflux disease) 03/16/2013  . Depression 02/10/2009  . Human  immunodeficiency virus (HIV) disease ([place]) 03/24/2007  . Anxiety state 03/24/2007  . ASTHMA 03/24/2007   Past Medical History:  Diagnosis Date  . Anemia   . Anxiety   . Asthma   . Bulging lumbar disc   . Chronic back pain   . Depression   . Genital HSV   . GERD (gastroesophageal reflux disease)   . Hepatitis    States she got a shot for this  . HIV infection ([place])   . Migraines   . Pneumonia   . Pre-diabetes     Family History  Problem Relation Age of Onset  . Hyperlipidemia Mother   . Diabetes Mother   . Stroke Mother   . Diabetes Brother   . Colon cancer Father   . Esophageal cancer Neg Hx   . Rectal cancer Neg Hx   . Stomach cancer Neg Hx     Past Surgical History:  Procedure Laterality Date  . ABDOMINAL HYSTERECTOMY N/A 06/28/2015   Procedure: HYSTERECTOMY ABDOMINAL;  Surgeon: Osborne Oman, MD;  Location: [facility];  Service: Gynecology;  Laterality: N/A;  Requested 06/28/15 @ 7:30a  . ANKLE ARTHROSCOPY Right 10/16/2017   Procedure: RIGHT ANKLE ARTHROSCOPIC DEBRIDEMENT;  Surgeon: Newt Minion, MD;  Location: [facility];  Service: Orthopedics;  Laterality: Right;  . BILATERAL SALPINGECTOMY Bilateral 06/28/2015   Procedure: BILATERAL SALPINGECTOMY;  Surgeon: Osborne Oman, MD;  Location: [facility];  Service: Gynecology;  Laterality: Bilateral;  . CESAREAN SECTION     twice;  vertical incision x 2  . COLONOSCOPY N/A 10/12/2013   Procedure: COLONOSCOPY;  Surgeon: Jerene Bears, MD;  [facility];  Service: Endoscopy;  Laterality: N/A;  . ESOPHAGEAL MANOMETRY N/A 06/29/2013   Procedure: ESOPHAGEAL MANOMETRY (EM);  Surgeon: Jerene Bears, MD;  [facility];  Service: Gastroenterology;  Laterality: N/A;  . HELLER MYOTOMY N/A 10/08/2013   Procedure: LAPAROSCOPIC HELLER MYOTOMY, DOR FUNDIPLICATION, UPPER ENDOSCOPY;  Surgeon: Odis Hollingshead, MD;  [facility];  Service: General;  Laterality: N/A;  . HERNIA REPAIR    . HYSTEROSCOPY WITH RESECTOSCOPE N/A  05/03/2015   Procedure: Diagnostic Hysteroscopy;  Surgeon: Lavonia Drafts, MD;  Location: [facility];  Service: Gynecology;  Laterality: N/A;  wants Novasure and myosure.  Marland Kitchen LYSIS OF ADHESION  06/28/2015   Procedure: LYSIS OF ADHESION;  Surgeon: Osborne Oman, MD;  Location: [facility];  Service: Gynecology;;  . OTHER SURGICAL HISTORY     biopsy axillary ,right arm  . right arm biopsy    . UPPER GI ENDOSCOPY N/A 10/08/2013   Procedure: UPPER GI ENDOSCOPY;  Surgeon: Odis Hollingshead, MD;  [facility]: [facility];  Service: General;  Laterality: N/A;  . WISDOM TOOTH EXTRACTION     Social History   Occupational History  . Occupation: House wife  Tobacco Use  . Smoking status: Never Smoker  . Smokeless tobacco: Never Used  Substance and Sexual Activity  . Alcohol use:  No  . Drug use: No  . Sexual activity: Not Currently    Partners: Male    Birth control/protection: Surgical    Comment: pt. given condoms

## 2018-03-12 DIAGNOSIS — M25671 Stiffness of right ankle, not elsewhere classified: Secondary | ICD-10-CM | POA: Diagnosis not present

## 2018-03-12 DIAGNOSIS — R262 Difficulty in walking, not elsewhere classified: Secondary | ICD-10-CM | POA: Diagnosis not present

## 2018-03-12 DIAGNOSIS — F4321 Adjustment disorder with depressed mood: Secondary | ICD-10-CM | POA: Diagnosis not present

## 2018-03-12 DIAGNOSIS — M25571 Pain in right ankle and joints of right foot: Secondary | ICD-10-CM | POA: Diagnosis not present

## 2018-03-12 DIAGNOSIS — F411 Generalized anxiety disorder: Secondary | ICD-10-CM | POA: Diagnosis not present

## 2018-03-12 DIAGNOSIS — F410 Panic disorder [episodic paroxysmal anxiety] without agoraphobia: Secondary | ICD-10-CM | POA: Diagnosis not present

## 2018-03-12 DIAGNOSIS — M6281 Muscle weakness (generalized): Secondary | ICD-10-CM | POA: Diagnosis not present

## 2018-03-21 DIAGNOSIS — R350 Frequency of micturition: Secondary | ICD-10-CM | POA: Diagnosis not present

## 2018-03-21 DIAGNOSIS — R351 Nocturia: Secondary | ICD-10-CM | POA: Diagnosis not present

## 2018-04-04 DIAGNOSIS — R262 Difficulty in walking, not elsewhere classified: Secondary | ICD-10-CM | POA: Diagnosis not present

## 2018-04-04 DIAGNOSIS — M6281 Muscle weakness (generalized): Secondary | ICD-10-CM | POA: Diagnosis not present

## 2018-04-04 DIAGNOSIS — M25571 Pain in right ankle and joints of right foot: Secondary | ICD-10-CM | POA: Diagnosis not present

## 2018-04-04 DIAGNOSIS — M25671 Stiffness of right ankle, not elsewhere classified: Secondary | ICD-10-CM | POA: Diagnosis not present

## 2018-04-08 ENCOUNTER — Ambulatory Visit (INDEPENDENT_AMBULATORY_CARE_PROVIDER_SITE_OTHER): Payer: Medicare Other | Admitting: Orthopedic Surgery

## 2018-04-08 ENCOUNTER — Encounter (INDEPENDENT_AMBULATORY_CARE_PROVIDER_SITE_OTHER): Payer: Self-pay | Admitting: Orthopedic Surgery

## 2018-04-08 DIAGNOSIS — M25571 Pain in right ankle and joints of right foot: Secondary | ICD-10-CM | POA: Diagnosis not present

## 2018-04-08 DIAGNOSIS — M6281 Muscle weakness (generalized): Secondary | ICD-10-CM | POA: Diagnosis not present

## 2018-04-08 DIAGNOSIS — M25671 Stiffness of right ankle, not elsewhere classified: Secondary | ICD-10-CM | POA: Diagnosis not present

## 2018-04-08 DIAGNOSIS — R262 Difficulty in walking, not elsewhere classified: Secondary | ICD-10-CM | POA: Diagnosis not present

## 2018-04-08 NOTE — Progress Notes (Signed)
Office Visit Note   Patient: Danielle [name]           Date of Birth: Apr 14, 1971           MRN: [id number] Visit Date: 04/08/2018              Requested by: Campbell Riches, MD [place], [place] [address] PCP: Campbell Riches, MD  Chief Complaint  Patient presents with  . Right Ankle - Follow-up      HPI: Patient is a 47 year old woman status post right ankle arthroscopy she is 6 months out from surgery she is currently going to physical therapy.  She states the ankle pain is resolved but she is having pain across the midfoot.  Assessment & Plan: Visit Diagnoses:  1. Pain in right ankle and joints of right foot     Plan: Continue with her stiff soled sneakers instructions were provided and demonstrated for Achilles stretching.  She will work on heel cord stretching and subtalar motion with therapy.  Follow-Up Instructions: Return in about 1 month (around 05/06/2018).   Ortho Exam  Patient is alert, oriented, no adenopathy, well-dressed, normal affect, normal respiratory effort. Examination patient has no dystrophic changes no pain to light touch.  The ankle is nontender to palpation she has dorsiflexion to neutral.  The midfoot is globally tender to palpation there is no redness no cellulitis no skin breakdown no callus  Imaging: No results found. No images are attached to the encounter.  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 04/27/2015     Lab Results  Component Value Date   ALBUMIN 4.1 02/19/2018   ALBUMIN 3.9 10/16/2017   ALBUMIN 3.9 01/17/2017    There is no height or weight on file to calculate BMI.  Orders:  No orders of the defined types were placed in this encounter.  No orders of the defined types were  placed in this encounter.    Procedures: No procedures performed  Clinical Data: No additional findings.  ROS:  All other systems negative, except as noted in the HPI. Review of Systems  Objective: Vital Signs: LMP  (LMP Unknown)   Specialty Comments:  No specialty comments available.  PMFS History: Patient Active Problem List   Diagnosis Date Noted  . Hallux rigidus, right foot 03/11/2018  . 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 Impingement syndrome of right ankle   . Sprain of calcaneofibular ligament of right ankle 07/04/2017  . Ankle pain, right 06/24/2017  . Hyperglycemia 02/18/2017  . Hepatitis B immune 02/18/2017  . 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 S/P total abdominal hysterectomy 06/28/2015  . Abnormal uterine bleeding (AUB) 05/03/2015  . Condyloma acuminatum 02/23/2015  . Fibroids 01/05/2015  . Urinary incontinence 01/05/2015  . Abnormal finding on GI tract imaging 10/12/2013  . Anemia-chronic 10/09/2013  . Achalasia s/p laparoscopic Heller myotomy and Dor fundoplication 12/87/8676  . Ganglion cyst of finger of right hand 07/06/2013  . Trichomoniasis of vagina 06/03/2013  . GERD (gastroesophageal reflux disease) 03/16/2013  . Depression 02/10/2009  . Human immunodeficiency virus (HIV) disease ([place]) 03/24/2007  . Anxiety state 03/24/2007  . ASTHMA 03/24/2007   Past Medical History:  Diagnosis Date  . Anemia   . Anxiety   .  Asthma   . Bulging lumbar disc   . Chronic back pain   . Depression   . Genital HSV   . GERD (gastroesophageal reflux disease)   . Hepatitis    States she got a shot for this  . HIV infection ([place])   . Migraines   . Pneumonia   . Pre-diabetes     Family History  Problem Relation Age of Onset  . Hyperlipidemia Mother   . Diabetes Mother   . Stroke Mother   . Diabetes Brother   . Colon cancer Father   . Esophageal cancer Neg Hx   . Rectal cancer Neg Hx   . Stomach cancer Neg Hx     Past Surgical  History:  Procedure Laterality Date  . ABDOMINAL HYSTERECTOMY N/A 06/28/2015   Procedure: HYSTERECTOMY ABDOMINAL;  Surgeon: Osborne Oman, MD;  Location: [facility];  Service: Gynecology;  Laterality: N/A;  Requested 06/28/15 @ 7:30a  . ANKLE ARTHROSCOPY Right 10/16/2017   Procedure: RIGHT ANKLE ARTHROSCOPIC DEBRIDEMENT;  Surgeon: Newt Minion, MD;  Location: [facility];  Service: Orthopedics;  Laterality: Right;  . BILATERAL SALPINGECTOMY Bilateral 06/28/2015   Procedure: BILATERAL SALPINGECTOMY;  Surgeon: Osborne Oman, MD;  Location: [facility];  Service: Gynecology;  Laterality: Bilateral;  . CESAREAN SECTION     twice;  vertical incision x 2  . COLONOSCOPY N/A 10/12/2013   Procedure: COLONOSCOPY;  Surgeon: Jerene Bears, MD;  [facility];  Service: Endoscopy;  Laterality: N/A;  . ESOPHAGEAL MANOMETRY N/A 06/29/2013   Procedure: ESOPHAGEAL MANOMETRY (EM);  Surgeon: Jerene Bears, MD;  [facility];  Service: Gastroenterology;  Laterality: N/A;  . HELLER MYOTOMY N/A 10/08/2013   Procedure: LAPAROSCOPIC HELLER MYOTOMY, DOR FUNDIPLICATION, UPPER ENDOSCOPY;  Surgeon: Odis Hollingshead, MD;  [facility];  Service: General;  Laterality: N/A;  . HERNIA REPAIR    . HYSTEROSCOPY WITH RESECTOSCOPE N/A 05/03/2015   Procedure: Diagnostic Hysteroscopy;  Surgeon: Lavonia Drafts, MD;  Location: [facility];  Service: Gynecology;  Laterality: N/A;  wants Novasure and myosure.  Marland Kitchen LYSIS OF ADHESION  06/28/2015   Procedure: LYSIS OF ADHESION;  Surgeon: Osborne Oman, MD;  Location: [facility];  Service: Gynecology;;  . OTHER SURGICAL HISTORY     biopsy axillary ,right arm  . right arm biopsy    . UPPER GI ENDOSCOPY N/A 10/08/2013   Procedure: UPPER GI ENDOSCOPY;  Surgeon: Odis Hollingshead, MD;  [facility];  Service: General;  Laterality: N/A;  . WISDOM TOOTH EXTRACTION     Social History   Occupational History  . Occupation: House wife  Tobacco Use  . Smoking status: Never  Smoker  . Smokeless tobacco: Never Used  Substance and Sexual Activity  . Alcohol use: No  . Drug use: No  . Sexual activity: Not Currently    Partners: Male    Birth control/protection: Surgical    Comment: pt. given condoms

## 2018-04-11 DIAGNOSIS — N3941 Urge incontinence: Secondary | ICD-10-CM | POA: Diagnosis not present

## 2018-04-11 DIAGNOSIS — R351 Nocturia: Secondary | ICD-10-CM | POA: Diagnosis not present

## 2018-04-17 DIAGNOSIS — F410 Panic disorder [episodic paroxysmal anxiety] without agoraphobia: Secondary | ICD-10-CM | POA: Diagnosis not present

## 2018-04-17 DIAGNOSIS — F4321 Adjustment disorder with depressed mood: Secondary | ICD-10-CM | POA: Diagnosis not present

## 2018-04-17 DIAGNOSIS — F411 Generalized anxiety disorder: Secondary | ICD-10-CM | POA: Diagnosis not present

## 2018-04-18 DIAGNOSIS — R351 Nocturia: Secondary | ICD-10-CM | POA: Diagnosis not present

## 2018-04-18 DIAGNOSIS — N3941 Urge incontinence: Secondary | ICD-10-CM | POA: Diagnosis not present

## 2018-04-18 DIAGNOSIS — N3010 Interstitial cystitis (chronic) without hematuria: Secondary | ICD-10-CM | POA: Diagnosis not present

## 2018-04-22 ENCOUNTER — Other Ambulatory Visit: Payer: Self-pay | Admitting: Urology

## 2018-04-22 DIAGNOSIS — M6281 Muscle weakness (generalized): Secondary | ICD-10-CM | POA: Diagnosis not present

## 2018-04-22 DIAGNOSIS — M25671 Stiffness of right ankle, not elsewhere classified: Secondary | ICD-10-CM | POA: Diagnosis not present

## 2018-04-22 DIAGNOSIS — M25571 Pain in right ankle and joints of right foot: Secondary | ICD-10-CM | POA: Diagnosis not present

## 2018-04-22 DIAGNOSIS — R262 Difficulty in walking, not elsewhere classified: Secondary | ICD-10-CM | POA: Diagnosis not present

## 2018-04-29 ENCOUNTER — Ambulatory Visit (HOSPITAL_COMMUNITY)
Admission: EM | Admit: 2018-04-29 | Discharge: 2018-04-29 | Disposition: A | Discharge status: Home / Self Care | Payer: Medicare Other | Attending: Family Medicine | Admitting: Family Medicine

## 2018-04-29 ENCOUNTER — Encounter (HOSPITAL_COMMUNITY): Payer: Self-pay

## 2018-04-29 ENCOUNTER — Other Ambulatory Visit: Payer: Self-pay

## 2018-04-29 DIAGNOSIS — M5431 Sciatica, right side: Secondary | ICD-10-CM

## 2018-04-29 DIAGNOSIS — M5432 Sciatica, left side: Secondary | ICD-10-CM

## 2018-04-29 MED ORDER — KETOROLAC TROMETHAMINE 30 MG/ML IJ SOLN
30.0000 mg | Freq: Once | INTRAMUSCULAR | Status: AC
  Administered 2018-04-29: 30 mg via INTRAMUSCULAR

## 2018-04-29 MED ORDER — KETOROLAC TROMETHAMINE 30 MG/ML IJ SOLN
INTRAMUSCULAR | Status: AC
  Filled 2018-04-29: qty 1

## 2018-04-29 MED ORDER — PREDNISONE 10 MG (21) PO TBPK: ORAL_TABLET | ORAL | 0 refills | Status: DC

## 2018-04-29 MED ORDER — CYCLOBENZAPRINE HCL 10 MG PO TABS: 10.0000 mg | ORAL_TABLET | Freq: Two times a day (BID) | ORAL | 0 refills | Status: DC | PRN

## 2018-04-29 NOTE — ED Provider Notes (Signed)
[facility]    CSN: [id number] Arrival date & time: 04/29/18  0908     History   Chief Complaint Chief Complaint  Patient presents with  . Back Pain    HPI Danielle [name] is a 47 y.o. female.   Patient is a 47 year old female past medical history of anemia, anxiety, asthma, bulging disc, chronic back pain, GERD, HIV, migraines, pneumonia, prediabetes.  She presents with 4 days of lower back pain that is gotten increasingly worse.  The pain is worse in the left lumbar region with radiation into the left leg.  It is hard for her to stand, walk, bend over.  The pain gets worse throughout the day and with weather changes.  She denies any numbness, tingling, loss of bowel or bladder function.  She has a history of chronic back pain has been told she had a pinched nerve in the past.  She denies any urinary symptoms. Denies any weakness in the legs.  She has been taking ibuprofen for the pain without any relief.   Xray reviewed from 02/15/18.  She does not smoke   ROS per HPI      Past Medical History:  Diagnosis Date  . Anemia   . Anxiety   . Asthma   . Bulging lumbar disc   . Chronic back pain   . Depression   . Genital HSV   . GERD (gastroesophageal reflux disease)   . Hepatitis    States she got a shot for this  . HIV infection ([place])   . Migraines   . Pneumonia   . Pre-diabetes     Patient Active Problem List   Diagnosis Date Noted  . Hallux rigidus, right foot 03/11/2018  . 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 Impingement syndrome of right ankle   . Sprain of calcaneofibular ligament of right ankle 07/04/2017  . Ankle pain, right 06/24/2017  . Hyperglycemia 02/18/2017  . Hepatitis B immune 02/18/2017  . 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 S/P total abdominal hysterectomy 06/28/2015  . Abnormal uterine bleeding (AUB) 05/03/2015  . Condyloma acuminatum 02/23/2015  . Fibroids 01/05/2015  . Urinary incontinence 01/05/2015  . Abnormal  finding on GI tract imaging 10/12/2013  . Anemia-chronic 10/09/2013  . Achalasia s/p laparoscopic Heller myotomy and Dor fundoplication 05/39/7673  . Ganglion cyst of finger of right hand 07/06/2013  . Trichomoniasis of vagina 06/03/2013  . GERD (gastroesophageal reflux disease) 03/16/2013  . Depression 02/10/2009  . Human immunodeficiency virus (HIV) disease ([place]) 03/24/2007  . Anxiety state 03/24/2007  . ASTHMA 03/24/2007    Past Surgical History:  Procedure Laterality Date  . ABDOMINAL HYSTERECTOMY N/A 06/28/2015   Procedure: HYSTERECTOMY ABDOMINAL;  Surgeon: Osborne Oman, MD;  Location: [facility];  Service: Gynecology;  Laterality: N/A;  Requested 06/28/15 @ 7:30a  . ANKLE ARTHROSCOPY Right 10/16/2017   Procedure: RIGHT ANKLE ARTHROSCOPIC DEBRIDEMENT;  Surgeon: Newt Minion, MD;  Location: [facility];  Service: Orthopedics;  Laterality: Right;  . BILATERAL SALPINGECTOMY Bilateral 06/28/2015   Procedure: BILATERAL SALPINGECTOMY;  Surgeon: Osborne Oman, MD;  Location: [facility];  Service: Gynecology;  Laterality: Bilateral;  . CESAREAN SECTION     twice;  vertical incision x 2  . COLONOSCOPY N/A 10/12/2013   Procedure: COLONOSCOPY;  Surgeon: Jerene Bears, MD;  [facility];  Service: Endoscopy;  Laterality: N/A;  . ESOPHAGEAL MANOMETRY N/A 06/29/2013   Procedure: ESOPHAGEAL MANOMETRY (EM);  Surgeon: Jerene Bears, MD;  Location: [facility];  Service: Gastroenterology;  Laterality: N/A;  . HELLER MYOTOMY N/A 10/08/2013   Procedure: LAPAROSCOPIC HELLER MYOTOMY, DOR FUNDIPLICATION, UPPER ENDOSCOPY;  Surgeon: Odis Hollingshead, MD;  [facility];  Service: General;  Laterality: N/A;  . HERNIA REPAIR    . HYSTEROSCOPY WITH RESECTOSCOPE N/A 05/03/2015   Procedure: Diagnostic Hysteroscopy;  Surgeon: Lavonia Drafts, MD;  Location: [facility];  Service: Gynecology;  Laterality: N/A;  wants Novasure and myosure.  Marland Kitchen LYSIS OF ADHESION  06/28/2015   Procedure: LYSIS OF ADHESION;   Surgeon: Osborne Oman, MD;  Location: [facility];  Service: Gynecology;;  . OTHER SURGICAL HISTORY     biopsy axillary ,right arm  . right arm biopsy    . UPPER GI ENDOSCOPY N/A 10/08/2013   Procedure: UPPER GI ENDOSCOPY;  Surgeon: Odis Hollingshead, MD;  [facility];  Service: General;  Laterality: N/A;  . WISDOM TOOTH EXTRACTION      OB History    Gravida  5   Para  3   Term  3   Preterm      AB  2   Living  3     SAB  2   TAB      Ectopic      Multiple      Live Births               Home Medications    Prior to Admission medications   Medication Sig Start Date End Date Taking? Authorizing Provider  bictegravir-emtricitabine-tenofovir AF (BIKTARVY) 50-200-25 MG TABS tablet Take 1 tablet by mouth daily. 12/23/17   Campbell Riches, MD  cetirizine (ZYRTEC) 10 MG tablet Take 1 tablet (10 mg total) by mouth daily. 02/24/18   Tasia Catchings, Amy V, PA-C  cyclobenzaprine (FLEXERIL) 10 MG tablet Take 1 tablet (10 mg total) by mouth 2 (two) times daily as needed for muscle spasms. 04/29/18   Loura Halt A, NP  doxycycline (VIBRAMYCIN) 100 MG capsule Take 1 capsule (100 mg total) by mouth 2 (two) times daily. Fill on 6/22 if symptoms not improving. Patient not taking: Reported on 04/29/2018 03/01/18   Ok Edwards, PA-C  DULoxetine (CYMBALTA) 30 MG capsule Take 30 mg by mouth at bedtime.    [provider]  fluticasone (FLONASE) 50 MCG/ACT nasal spray Place 2 sprays into both nostrils daily. Patient not taking: Reported on 04/29/2018 02/24/18   Ok Edwards, PA-C  ibuprofen (ADVIL,MOTRIN) 600 MG tablet Take 1 tablet (600 mg total) by mouth every 6 (six) hours as needed. 02/15/18   Glendell Docker, NP  ipratropium (ATROVENT) 0.06 % nasal spray Place 2 sprays into both nostrils 4 (four) times daily. 02/24/18   Tasia Catchings, Amy V, PA-C  omeprazole (PRILOSEC) 20 MG capsule Take 1 capsule (20 mg total) by mouth daily. 12/23/17   Campbell Riches, MD  oxybutynin (DITROPAN-XL) 10 MG 24 hr tablet  Take 10 mg by mouth daily.    [provider]  predniSONE (STERAPRED UNI-PAK 21 TAB) 10 MG (21) TBPK tablet 6 tabs for 1 day, then 5 tabs for 1 das, then 4 tabs for 1 day, then 3 tabs for 1 day, 2 tabs for 1 day, then 1 tab for 1 day 04/29/18   Loura Halt A, NP  PROAIR HFA 108 (90 BASE) MCG/ACT inhaler Inhale 2 puffs into the lungs every 4 (four) hours as needed for wheezing or shortness of breath. Shortness of breath/wheezing 12/31/14   [provider]  Doug Sou  90 MCG/ACT inhaler Inhale 2 puffs into the lungs 2 (two) times daily. 02/24/18   Tasia Catchings, Amy V, PA-C  SUMAtriptan (IMITREX) 100 MG tablet Take 1 tablet (100 mg total) by mouth every 2 (two) hours as needed for migraine. May repeat in 2 hours if headache persists or recurs. 01/02/17   Lysbeth Penner, FNP  valACYclovir (VALTREX) 500 MG tablet TAKE 1 TABLET(500 MG) BY MOUTH DAILY 01/02/18   Tommy Medal, Lavell Islam, MD    Family History Family History  Problem Relation Age of Onset  . Hyperlipidemia Mother   . Diabetes Mother   . Stroke Mother   . Diabetes Brother   . Colon cancer Father   . Esophageal cancer Neg Hx   . Rectal cancer Neg Hx   . Stomach cancer Neg Hx     Social History Social History   Tobacco Use  . Smoking status: Never Smoker  . Smokeless tobacco: Never Used  Substance Use Topics  . Alcohol use: No  . Drug use: No     Allergies   Latex   Review of Systems Review of Systems   Physical Exam Triage Vital Signs ED Triage Vitals  Enc Vitals Group     BP 04/29/18 1010 (!) 141/89     Pulse Rate 04/29/18 1010 86     Resp 04/29/18 1010 18     Temp 04/29/18 1010 98.4 F (36.9 C)     Temp Source 04/29/18 1010 Oral     SpO2 04/29/18 1010 100 %     Weight 04/29/18 1012 178 lb (80.7 kg)     Height --      Head Circumference --      Peak Flow --      Pain Score --      Pain Loc --      Pain Edu? --      Excl. in [place]? --    No data found.  Updated Vital Signs BP (!) 141/89    Pulse 86   Temp 98.4 F (36.9 C) (Oral)   Resp 18   Wt 178 lb (80.7 kg)   LMP  (LMP Unknown)   SpO2 100%   BMI 32.56 kg/m   Visual Acuity Right Eye Distance:   Left Eye Distance:   Bilateral Distance:    Right Eye Near:   Left Eye Near:    Bilateral Near:     Physical Exam  Constitutional: She appears well-developed and well-nourished.  Pt sitting in wheelchair in the room. Pleasant but in obvious pain.   HENT:  Head: Normocephalic and atraumatic.  Neck: Normal range of motion.  Pulmonary/Chest: Effort normal.  Musculoskeletal: She exhibits tenderness. She exhibits no edema or deformity.  Tenderness to palpation of the paravertebral muscles of the lumbar spine and sciatic notch. bony tenderness to lumbar spine.   Pt very limited ROM and unable to stand up straight in the exam room.   Neurological: She is alert. No sensory deficit.  Skin: Skin is warm and dry.  Psychiatric: She has a normal mood and affect.  Nursing note and vitals reviewed.    UC Treatments / Results  Labs (all labs ordered are listed, but only abnormal results are displayed) Labs Reviewed - No data to display  EKG None  Radiology No results found.  Procedures Procedures (including critical care time)  Medications Ordered in UC Medications  ketorolac (TORADOL) 30 MG/ML injection 30 mg (30 mg Intramuscular Given 04/29/18 1049)    Initial  Impression / Assessment and Plan / UC Course  I have reviewed the triage vital signs and the nursing notes.  Pertinent labs & imaging results that were available during my care of the patient were reviewed by me and considered in my medical decision making (see chart for details).     Lumbar x-ray reviewed from 02/15/2018.  X-ray was normal at this time.  There is no need for repeat x-ray.  She has had no new injuries.  Most likely sciatic nerve pain Will treat with prednisone and muscle relaxant.  If the pain continues she will need to follow up with  orthopedic.  Pt agreeable to plan.   Final Clinical Impressions(s) / UC Diagnoses   Final diagnoses:  Bilateral sciatica     Discharge Instructions     It was nice meeting you!!  We will go ahead and treat you for nerve inflammation. Toradol injection here.  No need for x ray today.  Be aware the muscle relaxant can make you very sleepy.  If you are seeing no improvement in the next week please follow up with orthopedic.      ED Prescriptions    Medication Sig Dispense Auth. Provider   predniSONE (STERAPRED UNI-PAK 21 TAB) 10 MG (21) TBPK tablet 6 tabs for 1 day, then 5 tabs for 1 das, then 4 tabs for 1 day, then 3 tabs for 1 day, 2 tabs for 1 day, then 1 tab for 1 day 21 tablet Airam Runions A, NP   cyclobenzaprine (FLEXERIL) 10 MG tablet Take 1 tablet (10 mg total) by mouth 2 (two) times daily as needed for muscle spasms. 20 tablet Loura Halt A, NP     Controlled Substance Prescriptions [place] Controlled Substance Registry consulted? Not Applicable   Orvan July, NP 04/29/18 [phone]

## 2018-04-29 NOTE — ED Triage Notes (Signed)
Back pain x 4 days.  

## 2018-04-29 NOTE — Discharge Instructions (Signed)
It was nice meeting you!!  We will go ahead and treat you for nerve inflammation. Toradol injection here.  No need for x ray today.  Be aware the muscle relaxant can make you very sleepy.  If you are seeing no improvement in the next week please follow up with orthopedic.

## 2018-05-01 DIAGNOSIS — F4321 Adjustment disorder with depressed mood: Secondary | ICD-10-CM | POA: Diagnosis not present

## 2018-05-01 DIAGNOSIS — F410 Panic disorder [episodic paroxysmal anxiety] without agoraphobia: Secondary | ICD-10-CM | POA: Diagnosis not present

## 2018-05-01 DIAGNOSIS — F411 Generalized anxiety disorder: Secondary | ICD-10-CM | POA: Diagnosis not present

## 2018-05-06 DIAGNOSIS — R262 Difficulty in walking, not elsewhere classified: Secondary | ICD-10-CM | POA: Diagnosis not present

## 2018-05-06 DIAGNOSIS — F3132 Bipolar disorder, current episode depressed, moderate: Secondary | ICD-10-CM | POA: Diagnosis not present

## 2018-05-06 DIAGNOSIS — M6281 Muscle weakness (generalized): Secondary | ICD-10-CM | POA: Diagnosis not present

## 2018-05-06 DIAGNOSIS — F411 Generalized anxiety disorder: Secondary | ICD-10-CM | POA: Diagnosis not present

## 2018-05-06 DIAGNOSIS — M25671 Stiffness of right ankle, not elsewhere classified: Secondary | ICD-10-CM | POA: Diagnosis not present

## 2018-05-06 DIAGNOSIS — M25571 Pain in right ankle and joints of right foot: Secondary | ICD-10-CM | POA: Diagnosis not present

## 2018-05-08 ENCOUNTER — Ambulatory Visit (INDEPENDENT_AMBULATORY_CARE_PROVIDER_SITE_OTHER): Payer: Medicare Other | Admitting: Orthopedic Surgery

## 2018-05-08 ENCOUNTER — Encounter (INDEPENDENT_AMBULATORY_CARE_PROVIDER_SITE_OTHER): Payer: Self-pay | Admitting: Orthopedic Surgery

## 2018-05-08 DIAGNOSIS — M25571 Pain in right ankle and joints of right foot: Secondary | ICD-10-CM | POA: Diagnosis not present

## 2018-05-08 DIAGNOSIS — M2021 Hallux rigidus, right foot: Secondary | ICD-10-CM

## 2018-05-08 DIAGNOSIS — M6701 Short Achilles tendon (acquired), right ankle: Secondary | ICD-10-CM

## 2018-05-08 NOTE — Progress Notes (Signed)
Office Visit Note   Patient: Danielle [name]           Date of Birth: 1971/07/25           MRN: [id number] Visit Date: 05/08/2018              Requested by: Campbell Riches, MD [address] [place], [place] [address] PCP: Campbell Riches, MD  Chief Complaint  Patient presents with  . Right Ankle - Pain, Routine Post Op, Follow-up      HPI: Patient is a 47 year old woman who is about 7 months status post arthroscopic debridement of the right ankle.  She does have Achilles contracture and she is currently going to physical therapy for Achilles stretching.  Patient also has hallux rigidus and we have talked about supportive shoe wear.  She currently wears bedroom sandals for her ambulation.  Assessment & Plan: Visit Diagnoses:  1. Achilles tendon contracture, right   2. Hallux rigidus, right foot   3. Pain in right ankle and joints of right foot     Plan: Recommended a stiff soled walking or hiking shoe.  Discussed that if she uses this for all of her activities and is still symptomatic we would need to consider either an MTP fusion versus a cheilectomy.  Discussed that with the fusion she would have no motion of the toe and with a cheilectomy she would have about 50% improvement of her symptoms.  Recommend she continue her physical therapy and start using a stiff soled shoe for her ADLs.  Follow-Up Instructions: Return in about 4 weeks (around 06/05/2018).   Ortho Exam  Patient is alert, oriented, no adenopathy, well-dressed, normal affect, normal respiratory effort. Examination patient has a good pulse she has dorsiflexion to neutral which is painful along the Achilles.  Her ankle is nontender to palpation.  She only has 20 degrees of dorsiflexion of the great toe MTP joint.  She has pain to palpation over the dorsal osteophytic bone spurs.  Imaging: No results found. No images are attached to the encounter.  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 04/27/2015     Lab Results  Component Value Date   ALBUMIN 4.1 02/19/2018   ALBUMIN 3.9 10/16/2017   ALBUMIN 3.9 01/17/2017    There is no height or weight on file to calculate BMI.  Orders:  No orders of the defined types were placed in this encounter.  No orders of the defined types were placed in this encounter.    Procedures: No procedures performed  Clinical Data: No additional findings.  ROS:  All other systems negative, except as noted in the HPI. Review of Systems  Objective: Vital Signs: LMP  (LMP Unknown)   Specialty Comments:  No specialty comments available.  PMFS History: Patient Active Problem List   Diagnosis Date Noted  . Hallux rigidus, right foot 03/11/2018  . 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 Impingement syndrome of right ankle   . Sprain of calcaneofibular ligament of right ankle 07/04/2017  . Ankle pain, right 06/24/2017  . Hyperglycemia 02/18/2017  . Hepatitis B immune 02/18/2017  . 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 S/P total abdominal hysterectomy 06/28/2015  . Abnormal uterine bleeding (AUB) 05/03/2015  . Condyloma acuminatum 02/23/2015  .  Fibroids 01/05/2015  . Urinary incontinence 01/05/2015  . Abnormal finding on GI tract imaging 10/12/2013  . Anemia-chronic 10/09/2013  . Achalasia s/p laparoscopic Heller myotomy and Dor fundoplication 57/84/6962  . Ganglion cyst of finger of right hand 07/06/2013  . Trichomoniasis of vagina 06/03/2013  . GERD (gastroesophageal reflux disease) 03/16/2013  . Depression 02/10/2009  . Human immunodeficiency virus (HIV) disease ([place]) 03/24/2007  . Anxiety state 03/24/2007  . ASTHMA 03/24/2007   Past Medical History:  Diagnosis Date  . Anemia   . Anxiety   . Asthma     . Bulging lumbar disc   . Chronic back pain   . Depression   . Genital HSV   . GERD (gastroesophageal reflux disease)   . Hepatitis    States she got a shot for this  . HIV infection ([place])   . Migraines   . Pneumonia   . Pre-diabetes     Family History  Problem Relation Age of Onset  . Hyperlipidemia Mother   . Diabetes Mother   . Stroke Mother   . Diabetes Brother   . Colon cancer Father   . Esophageal cancer Neg Hx   . Rectal cancer Neg Hx   . Stomach cancer Neg Hx     Past Surgical History:  Procedure Laterality Date  . ABDOMINAL HYSTERECTOMY N/A 06/28/2015   Procedure: HYSTERECTOMY ABDOMINAL;  Surgeon: Osborne Oman, MD;  Location: [facility];  Service: Gynecology;  Laterality: N/A;  Requested 06/28/15 @ 7:30a  . ANKLE ARTHROSCOPY Right 10/16/2017   Procedure: RIGHT ANKLE ARTHROSCOPIC DEBRIDEMENT;  Surgeon: Newt Minion, MD;  Location: [facility];  Service: Orthopedics;  Laterality: Right;  . BILATERAL SALPINGECTOMY Bilateral 06/28/2015   Procedure: BILATERAL SALPINGECTOMY;  Surgeon: Osborne Oman, MD;  Location: [facility];  Service: Gynecology;  Laterality: Bilateral;  . CESAREAN SECTION     twice;  vertical incision x 2  . COLONOSCOPY N/A 10/12/2013   Procedure: COLONOSCOPY;  Surgeon: Jerene Bears, MD;  [facility];  Service: Endoscopy;  Laterality: N/A;  . ESOPHAGEAL MANOMETRY N/A 06/29/2013   Procedure: ESOPHAGEAL MANOMETRY (EM);  Surgeon: Jerene Bears, MD;  [facility];  Service: Gastroenterology;  Laterality: N/A;  . HELLER MYOTOMY N/A 10/08/2013   Procedure: LAPAROSCOPIC HELLER MYOTOMY, DOR FUNDIPLICATION, UPPER ENDOSCOPY;  Surgeon: Odis Hollingshead, MD;  [facility];  Service: General;  Laterality: N/A;  . HERNIA REPAIR    . HYSTEROSCOPY WITH RESECTOSCOPE N/A 05/03/2015   Procedure: Diagnostic Hysteroscopy;  Surgeon: Lavonia Drafts, MD;  Location: [facility];  Service: Gynecology;  Laterality: N/A;  wants Novasure and myosure.  Marland Kitchen LYSIS OF  ADHESION  06/28/2015   Procedure: LYSIS OF ADHESION;  Surgeon: Osborne Oman, MD;  Location: [facility];  Service: Gynecology;;  . OTHER SURGICAL HISTORY     biopsy axillary ,right arm  . right arm biopsy    . UPPER GI ENDOSCOPY N/A 10/08/2013   Procedure: UPPER GI ENDOSCOPY;  Surgeon: Odis Hollingshead, MD;  [facility];  Service: General;  Laterality: N/A;  . WISDOM TOOTH EXTRACTION     Social History   Occupational History  . Occupation: House wife  Tobacco Use  . Smoking status: Never Smoker  . Smokeless tobacco: Never Used  Substance and Sexual Activity  . Alcohol use: No  . Drug use: No  . Sexual activity: Not Currently    Partners: Male    Birth control/protection: Surgical    Comment: pt. given condoms

## 2018-05-19 ENCOUNTER — Encounter (HOSPITAL_BASED_OUTPATIENT_CLINIC_OR_DEPARTMENT_OTHER): Payer: Self-pay

## 2018-05-19 DIAGNOSIS — F4321 Adjustment disorder with depressed mood: Secondary | ICD-10-CM | POA: Diagnosis not present

## 2018-05-19 DIAGNOSIS — F411 Generalized anxiety disorder: Secondary | ICD-10-CM | POA: Diagnosis not present

## 2018-05-19 DIAGNOSIS — F410 Panic disorder [episodic paroxysmal anxiety] without agoraphobia: Secondary | ICD-10-CM | POA: Diagnosis not present

## 2018-05-20 ENCOUNTER — Encounter (HOSPITAL_BASED_OUTPATIENT_CLINIC_OR_DEPARTMENT_OTHER): Payer: Self-pay

## 2018-05-20 ENCOUNTER — Other Ambulatory Visit: Payer: Self-pay

## 2018-05-23 DIAGNOSIS — F411 Generalized anxiety disorder: Secondary | ICD-10-CM | POA: Diagnosis not present

## 2018-05-23 DIAGNOSIS — F4321 Adjustment disorder with depressed mood: Secondary | ICD-10-CM | POA: Diagnosis not present

## 2018-05-23 DIAGNOSIS — F410 Panic disorder [episodic paroxysmal anxiety] without agoraphobia: Secondary | ICD-10-CM | POA: Diagnosis not present

## 2018-05-25 NOTE — H&P (Signed)
2016  Danielle [name] has urge incontinence and frequency greatly improved on Toviaz and oxybutynin ER. They both worked well and for co-pay reasons she uses oxybutynin. She was even having a little bit of bedwetting.   in the last 6 months since a hysterectomy perhaps the patient's urge incontinence has worsened. Toviaz 8 mg is no longer working well.   The patient for many months says the Myrbetriq is no longer working well. Though she does not wear a pad she has urge incontinence. She is now getting up 5 times or more at night. She is not clinically infected. She denies stress incontinence or may leak a small amount.   The patient is having ankle surgery tomorrow. Oxybutynin called in. Reassess in 8 weeks and proceed accordingly   The patient had her ankle surgery. She is having more frequency now. She will then sit and cannot go and other times can. I think the flow symptoms are related to overactive bladder. She still gets up 5 times at night. I would like to give her the Myrbetriq again is see her back on free samples in 5 weeks and ask about flow and overactive bladder next occasions then. She is continent.   On Myrbetriq. She can void every 10 minutes and every 1 hour. She cannot hold it for 2 hours. She has bladder fullness and no pain. She gets up at least 5 times a night. She does not have cystitis symptoms. She is continent   she had similar symptoms when she had urodynamics in 2013. She never had evidence of interstitial cystitis on urodynamics. She also has significant nighttime frequency and severe frequency as noted above.   I will repeat urodynamics due to presentation out of the ordinary and severity. We will look for evidence of interstitial cystitis. I did document the past that she I believe is HIV positive. Neuro modulation treatments are options to consider moving forward   Today  Frequency and nighttime frequency stable. Clinically noninfected  On urodynamics the patient voided  with a slow flow and emptied efficiently. Maximum bladder capacity was 265 mL. She has sensory urgency. Bladder was unstable reaching pressure of 6 cm of water. She was able to inhibit but was finally given permission to void. She did after contraction reaching pressure 64 cm of water. She did not have stress incontinence with a Valsalva pressure 102 cm of water. She did have very mild bladder discomfort reaching a 2 at a 10 in discomfort. During voluntary voiding she voided 74 [place] a maximum flow of 5 mL/second. Maximum voiding pressures 26 cm of water. This was not associated with urgency void. EMG activity increased during the voiding phase. Bladder neck descended 1 cm. I did not see obvious pelvic floor dyssynergia she noted this was a large volume for her normal bladder capacity. She noted she feels discomfort in her bladder she holds it too long and she can have residual discomfort for a while after she voids   The patient's clinical presentation may be secondary to low-grade interstitial cystitis. The presentation is at the ordinary. The role of a hydrodistention discussed.   She does get some discomfort with bladder holding but no dyspareunia. She would like to proceed and I think this is a good idea.     ALLERGIES: No Allergies    MEDICATIONS: Oxybutynin Chloride Er 10 mg tablet, extended release 24 hr 1 tablet PO Daily  Albuterol AERS Inhalation  Descovy  Duloxetine Hcl 30 mg capsule,delayed release Oral  Hydrocodone-Acetaminophen 10-325 MG Oral Tablet Oral  Ranitidine Hcl 150 mg tablet Oral  Tivicay 50 mg tablet Oral     GU PSH: Complex cystometrogram, w/ void pressure and urethral pressure profile studies, any technique - 04/11/2018 Complex Uroflow - 04/11/2018 Emg surf Electrd - 04/11/2018 Inject For cystogram - 04/11/2018 Intrabd voidng Press - 04/11/2018      PSH Notes: Hernia Repair, Cesarean Section   NON-GU PSH: Cesarean Delivery Only - 2013 Hernia Repair - 2013    GU PMH:  Urge incontinence, Urge incontinence of urine - 2016 Urinary Frequency, Increased urinary frequency - 2016 Nocturia, Nocturia - 2014 Nocturnal Enuresis, Enuresis, nocturnal only - 2014      PMH Notes:  2012-04-04 14:46:34 - Note: Arthritis   NON-GU PMH: Encounter for general adult medical examination without abnormal findings, Encounter for preventive health examination - 2015 Anxiety, Anxiety (Symptom) - 2014 Asthma, Asthma - 2014 HIV, HIV Infection - 2014 Personal history of other diseases of the digestive system, History of esophageal reflux - 2014 Personal history of other mental and behavioral disorders, History of depression - 2014    FAMILY HISTORY: Colon Cancer - Father Death In The Family Father - Father Death In The Family Mother - Mother Family Health Status Number - Runs In Family Hypertension - Mother Pure Hypercholesterolemia - Mother   SOCIAL HISTORY: No Social History     Notes: Marital History - Currently Married, Caffeine Use, Never A Smoker, Alcohol Use, Tobacco Use, Occupation:   REVIEW OF SYSTEMS:    GU Review Female:   Patient denies frequent urination, hard to postpone urination, burning /pain with urination, get up at night to urinate, leakage of urine, stream starts and stops, trouble starting your stream, have to strain to urinate, and being pregnant.  Gastrointestinal (Upper):   Patient denies nausea, vomiting, and indigestion/ heartburn.  Gastrointestinal (Lower):   Patient denies diarrhea and constipation.  Constitutional:   Patient denies fever, night sweats, weight loss, and fatigue.  Skin:   Patient denies skin rash/ lesion and itching.  Eyes:   Patient denies blurred vision and double vision.  Ears/ Nose/ Throat:   Patient denies sore throat and sinus problems.  Hematologic/Lymphatic:   Patient denies swollen glands and easy bruising.  Cardiovascular:   Patient denies leg swelling and chest pains.  Respiratory:   Patient denies cough and shortness  of breath.  Endocrine:   Patient denies excessive thirst.  Musculoskeletal:   Patient denies joint pain and back pain.  Neurological:   Patient denies headaches and dizziness.  Psychologic:   Patient denies depression and anxiety.   VITAL SIGNS: None   PAST DATA REVIEWED:  Source Of History:  Patient   PROCEDURES:          Urinalysis w/Scope Dipstick Dipstick Cont'd Micro  Color: Yellow Bilirubin: Neg mg/dL WBC/hpf: NS (Not Seen)  Appearance: Clear Ketones: Neg mg/dL RBC/hpf: NS (Not Seen)  Specific Gravity: 1.015 Blood: Neg ery/uL Bacteria: NS (Not Seen)  pH: 8.5 Protein: 1+ mg/dL Cystals: NS (Not Seen)  Glucose: Neg mg/dL Urobilinogen: 0.2 mg/dL Casts: NS (Not Seen)    Nitrites: Neg Trichomonas: Not Present    Leukocyte Esterase: Neg leu/uL Mucous: Not Present      Epithelial Cells: 0 - 5/hpf      Yeast: NS (Not Seen)      Sperm: Not Present    ASSESSMENT:      ICD-10 Details  1 GU:   Nocturia - R35.1   2  Urge incontinence - N39.41   3   Interstitial Cystitis (w/o hematuria) - N30.10               Notes:   The patient understands that they she/he could have interstitial cystitis. We talked about the role of cystoscopy/hydrodistension and instillation in detail. Pros, cons, general surgical and anesthetic risks, and other options including watchful waiting were discussed. Risks were described but not limited to pain, infection, and bleeding. The risk of bladder perforation and management were discussed. The patient understands that it is primarily a diagnostic procedure.     After a thorough review of the management options for the patient's condition the patient  elected to proceed with surgical therapy as noted above. We have discussed the potential benefits and risks of the procedure, side effects of the proposed treatment, the likelihood of the patient achieving the goals of the procedure, and any potential problems that might occur during the procedure or recuperation.  Informed consent has been obtained.

## 2018-05-27 ENCOUNTER — Ambulatory Visit (HOSPITAL_BASED_OUTPATIENT_CLINIC_OR_DEPARTMENT_OTHER): Payer: Medicare Other | Admitting: Anesthesiology

## 2018-05-27 ENCOUNTER — Encounter (HOSPITAL_BASED_OUTPATIENT_CLINIC_OR_DEPARTMENT_OTHER)
Admission: RE | Disposition: A | Discharge status: Home / Self Care | Payer: Self-pay | Source: Ambulatory Visit | Attending: Urology

## 2018-05-27 ENCOUNTER — Ambulatory Visit (HOSPITAL_BASED_OUTPATIENT_CLINIC_OR_DEPARTMENT_OTHER)
Admission: RE | Admit: 2018-05-27 | Discharge: 2018-05-27 | Disposition: A | Discharge status: Home / Self Care | Payer: Medicare Other | Source: Ambulatory Visit | Attending: Urology | Admitting: Urology

## 2018-05-27 ENCOUNTER — Encounter (HOSPITAL_BASED_OUTPATIENT_CLINIC_OR_DEPARTMENT_OTHER): Payer: Self-pay

## 2018-05-27 DIAGNOSIS — F329 Major depressive disorder, single episode, unspecified: Secondary | ICD-10-CM | POA: Diagnosis not present

## 2018-05-27 DIAGNOSIS — R351 Nocturia: Secondary | ICD-10-CM | POA: Insufficient documentation

## 2018-05-27 DIAGNOSIS — Z21 Asymptomatic human immunodeficiency virus [HIV] infection status: Secondary | ICD-10-CM | POA: Insufficient documentation

## 2018-05-27 DIAGNOSIS — N3941 Urge incontinence: Secondary | ICD-10-CM | POA: Diagnosis not present

## 2018-05-27 DIAGNOSIS — N3010 Interstitial cystitis (chronic) without hematuria: Secondary | ICD-10-CM | POA: Insufficient documentation

## 2018-05-27 DIAGNOSIS — R102 Pelvic and perineal pain: Secondary | ICD-10-CM | POA: Diagnosis not present

## 2018-05-27 DIAGNOSIS — K219 Gastro-esophageal reflux disease without esophagitis: Secondary | ICD-10-CM | POA: Diagnosis not present

## 2018-05-27 DIAGNOSIS — Z79899 Other long term (current) drug therapy: Secondary | ICD-10-CM | POA: Insufficient documentation

## 2018-05-27 DIAGNOSIS — Z79891 Long term (current) use of opiate analgesic: Secondary | ICD-10-CM | POA: Diagnosis not present

## 2018-05-27 HISTORY — DX: Achalasia of cardia: K22.0

## 2018-05-27 HISTORY — DX: Anogenital (venereal) warts: A63.0

## 2018-05-27 HISTORY — DX: Bipolar disorder, unspecified: F31.9

## 2018-05-27 HISTORY — DX: Trichomoniasis, unspecified: A59.9

## 2018-05-27 HISTORY — DX: Ganglion, unspecified site: M67.40

## 2018-05-27 HISTORY — DX: Unspecified urinary incontinence: R32

## 2018-05-27 HISTORY — DX: Presence of spectacles and contact lenses: Z97.3

## 2018-05-27 HISTORY — DX: Abnormal uterine and vaginal bleeding, unspecified: N93.9

## 2018-05-27 HISTORY — DX: Leiomyoma of uterus, unspecified: D25.9

## 2018-05-27 HISTORY — PX: CYSTO WITH HYDRODISTENSION: SHX5453

## 2018-05-27 HISTORY — DX: Benign neoplasm of right breast: D24.1

## 2018-05-27 LAB — POCT I-STAT 4, (NA,K, GLUC, HGB,HCT)
GLUCOSE: 100 mg/dL — AB (ref 70–99)
HEMATOCRIT: 45 % (ref 36.0–46.0)
Hemoglobin: 15.3 g/dL — ABNORMAL HIGH (ref 12.0–15.0)
Potassium: 4.2 mmol/L (ref 3.5–5.1)
Sodium: 144 mmol/L (ref 135–145)

## 2018-05-27 SURGERY — CYSTOSCOPY, WITH BLADDER HYDRODISTENSION
Anesthesia: General | Site: Bladder

## 2018-05-27 MED ORDER — GLYCOPYRROLATE PF 0.2 MG/ML IJ SOSY
PREFILLED_SYRINGE | INTRAMUSCULAR | Status: AC
  Filled 2018-05-27: qty 1

## 2018-05-27 MED ORDER — DEXAMETHASONE SODIUM PHOSPHATE 10 MG/ML IJ SOLN
INTRAMUSCULAR | Status: DC | PRN
  Administered 2018-05-27: 10 mg via INTRAVENOUS

## 2018-05-27 MED ORDER — CIPROFLOXACIN IN D5W 400 MG/200ML IV SOLN
INTRAVENOUS | Status: AC
  Filled 2018-05-27: qty 200

## 2018-05-27 MED ORDER — LIDOCAINE 2% (20 MG/ML) 5 ML SYRINGE
INTRAMUSCULAR | Status: AC
  Filled 2018-05-27: qty 5

## 2018-05-27 MED ORDER — STERILE WATER FOR IRRIGATION IR SOLN
Status: DC | PRN
  Administered 2018-05-27: 3000 mL

## 2018-05-27 MED ORDER — LIDOCAINE 2% (20 MG/ML) 5 ML SYRINGE
INTRAMUSCULAR | Status: DC | PRN
  Administered 2018-05-27: 60 mg via INTRAVENOUS

## 2018-05-27 MED ORDER — PROPOFOL 10 MG/ML IV BOLUS
INTRAVENOUS | Status: DC | PRN
  Administered 2018-05-27: 150 mg via INTRAVENOUS

## 2018-05-27 MED ORDER — PROPOFOL 10 MG/ML IV BOLUS
INTRAVENOUS | Status: AC
  Filled 2018-05-27: qty 20

## 2018-05-27 MED ORDER — ONDANSETRON HCL 4 MG/2ML IJ SOLN
INTRAMUSCULAR | Status: AC
  Filled 2018-05-27: qty 2

## 2018-05-27 MED ORDER — EPHEDRINE 5 MG/ML INJ
INTRAVENOUS | Status: AC
  Filled 2018-05-27: qty 10

## 2018-05-27 MED ORDER — CIPROFLOXACIN IN D5W 400 MG/200ML IV SOLN
400.0000 mg | INTRAVENOUS | Status: AC
  Administered 2018-05-27: 400 mg via INTRAVENOUS
  Filled 2018-05-27: qty 200

## 2018-05-27 MED ORDER — DEXAMETHASONE SODIUM PHOSPHATE 10 MG/ML IJ SOLN
INTRAMUSCULAR | Status: AC
  Filled 2018-05-27: qty 1

## 2018-05-27 MED ORDER — LACTATED RINGERS IV SOLN
INTRAVENOUS | Status: DC
  Administered 2018-05-27: 08:00:00 via INTRAVENOUS
  Filled 2018-05-27: qty 1000

## 2018-05-27 MED ORDER — HYDROCODONE-ACETAMINOPHEN 5-325 MG PO TABS: 1.0000 | ORAL_TABLET | Freq: Four times a day (QID) | ORAL | 0 refills | Status: DC | PRN

## 2018-05-27 MED ORDER — MIDAZOLAM HCL 2 MG/2ML IJ SOLN
INTRAMUSCULAR | Status: AC
  Filled 2018-05-27: qty 2

## 2018-05-27 MED ORDER — PHENAZOPYRIDINE HCL 200 MG PO TABS
ORAL | Status: DC | PRN
  Administered 2018-05-27: 15 mL via INTRAVESICAL

## 2018-05-27 MED ORDER — MIDAZOLAM HCL 2 MG/2ML IJ SOLN
INTRAMUSCULAR | Status: DC | PRN
  Administered 2018-05-27: 2 mg via INTRAVENOUS

## 2018-05-27 SURGICAL SUPPLY — 26 items
BAG DRAIN URO-CYSTO SKYTR STRL (DRAIN) ×3 IMPLANT
BAG DRN UROCATH (DRAIN) ×1
CATH FOLEY 2WAY SLVR  5CC 18FR (CATHETERS)
CATH FOLEY 2WAY SLVR 5CC 18FR (CATHETERS) IMPLANT
CATH ROBINSON RED A/P 12FR (CATHETERS) IMPLANT
CATH ROBINSON RED A/P 14FR (CATHETERS) IMPLANT
CLOTH BEACON ORANGE TIMEOUT ST (SAFETY) ×3 IMPLANT
ELECT REM PT RETURN 9FT ADLT (ELECTROSURGICAL)
ELECTRODE REM PT RTRN 9FT ADLT (ELECTROSURGICAL) IMPLANT
GLOVE BIO SURGEON STRL SZ7.5 (GLOVE) ×3 IMPLANT
GLOVE BIOGEL PI IND STRL 7.5 (GLOVE) IMPLANT
GLOVE BIOGEL PI INDICATOR 7.5 (GLOVE) ×2
GLOVE SURG SIGNA 7.5 PF LTX (GLOVE) ×2 IMPLANT
GOWN STRL REUS W/ TWL XL LVL3 (GOWN DISPOSABLE) ×1 IMPLANT
GOWN STRL REUS W/TWL LRG LVL3 (GOWN DISPOSABLE) ×4 IMPLANT
GOWN STRL REUS W/TWL XL LVL3 (GOWN DISPOSABLE) ×3
KIT TURNOVER CYSTO (KITS) ×3 IMPLANT
MANIFOLD NEPTUNE II (INSTRUMENTS) IMPLANT
NDL SAFETY ECLIPSE 18X1.5 (NEEDLE) ×1 IMPLANT
NEEDLE HYPO 18GX1.5 SHARP (NEEDLE) ×3
PACK CYSTO (CUSTOM PROCEDURE TRAY) ×3 IMPLANT
SUT SILK 0 TIES 10X30 (SUTURE) IMPLANT
SYR 20CC LL (SYRINGE) ×3 IMPLANT
TUBE CONNECTING 12'X1/4 (SUCTIONS)
TUBE CONNECTING 12X1/4 (SUCTIONS) IMPLANT
WATER STERILE IRR 3000ML UROMA (IV SOLUTION) ×3 IMPLANT

## 2018-05-27 NOTE — Op Note (Signed)
Preoperative diagnosis: Chronic cystitis Postoperative diagnosis: Chronic cystitis Surgery: Cystoscopy and hydrodistention and bladder instillation therapy Surgeon: Dr. Nicki Reaper Georgene Kopper  Patient was prepped and draped in usual fashion.  Preoperative antibiotics were given.  Extra care was taken with leg positioning.  41 French cystoscope was utilized.  Bladder mucosa and trigone were normal.  She may have a very small ureterocele on the left effluxing well.  She had very prominent blood vessels throughout her entire bladder submucosally.  She was hydrodistended 550 mL.  Bladder was emptied.  On reinspection she did not have glomerulations.  There is no ulcers.  I felt there was a little bit of expansion of some of the blood vessels but they are not petechiae.  Bladder was empty.  As a separate procedure with a nonlatex catheter I instilled 15 cc of 0.5% Marcaine +400 mg of Pyridium  The findings were not in keeping with interstitial cystitis.  I will treat the patient has an overactive bladder.

## 2018-05-27 NOTE — Discharge Instructions (Signed)
°  Post Anesthesia Home Care Instructions  Activity: Get plenty of rest for the remainder of the day. A responsible adult should stay with you for 24 hours following the procedure.  For the next 24 hours, DO NOT: -Drive a car -Paediatric nurse -Drink alcoholic beverages -Take any medication unless instructed by your physician -Make any legal decisions or sign important papers.  Meals: Start with liquid foods such as gelatin or soup. Progress to regular foods as tolerated. Avoid greasy, spicy, heavy foods. If nausea and/or vomiting occur, drink only clear liquids until the nausea and/or vomiting subsides. Call your physician if vomiting continues.  Special Instructions/Symptoms: Your throat may feel dry or sore from the anesthesia or the breathing tube placed in your throat during surgery. If this causes discomfort, gargle with warm salt water. The discomfort should disappear within 24 hours.     Per Dr. McDiarmid: I have reviewed discharge instructions in detail with the patient. They will follow-up with me or their physician as scheduled. My nurse will also be calling the patients as per protocol.

## 2018-05-27 NOTE — Interval H&P Note (Signed)
History and Physical Interval Note:  05/27/2018 8:19 AM  Danielle [name]  has presented today for surgery, with the diagnosis of PELVIC PAIN  The various methods of treatment have been discussed with the patient and family. After consideration of risks, benefits and other options for treatment, the patient has consented to  Procedure(s): CYSTOSCOPY/HYDRODISTENSION AND INSTILLATION (N/A) as a surgical intervention .  The patient's history has been reviewed, patient examined, no change in status, stable for surgery.  I have reviewed the patient's chart and labs.  Questions were answered to the patient's satisfaction.     [name] A

## 2018-05-27 NOTE — Anesthesia Postprocedure Evaluation (Signed)
Anesthesia Post Note  Patient: Danielle [name]  Procedure(s) Performed: CYSTOSCOPY/HYDRODISTENSION AND INSTILLATION (N/A Bladder)     Patient location during evaluation: PACU Anesthesia Type: General Level of consciousness: awake and alert Pain management: pain level controlled Vital Signs Assessment: post-procedure vital signs reviewed and stable Respiratory status: spontaneous breathing, nonlabored ventilation, respiratory function stable and patient connected to nasal cannula oxygen Cardiovascular status: blood pressure returned to baseline and stable Postop Assessment: no apparent nausea or vomiting Anesthetic complications: no    Last Vitals:  Vitals:   05/27/18 1015 05/27/18 1055  BP: 123/83 117/78  Pulse: 80 73  Resp: 17 16  Temp:  36.7 C  SpO2: 98% 100%    Last Pain:  Vitals:   05/27/18 1055  TempSrc: Axillary  PainSc: 0-No pain                 Teal Raben S

## 2018-05-27 NOTE — Anesthesia Preprocedure Evaluation (Addendum)
Anesthesia Evaluation  Patient identified by MRN, date of birth, ID band Patient awake    Reviewed: Allergy & Precautions, NPO status , Patient's Chart, lab work & pertinent test results  Airway Mallampati: II  TM Distance: >3 FB Neck ROM: full    Dental  (+) Teeth Intact, Dental Advisory Given   Pulmonary asthma ,    breath sounds clear to auscultation       Cardiovascular negative cardio ROS   Rhythm:regular Rate:Normal     Neuro/Psych  Headaches, PSYCHIATRIC DISORDERS Anxiety Depression Bipolar Disorder    GI/Hepatic GERD  ,(+) Hepatitis -  Endo/Other    Renal/GU      Musculoskeletal   Abdominal   Peds  Hematology  (+) HIV,   Anesthesia Other Findings   Reproductive/Obstetrics                           Anesthesia Physical Anesthesia Plan  ASA: II  Anesthesia Plan: General   Post-op Pain Management:    Induction: Intravenous  PONV Risk Score and Plan: 3 and Ondansetron, Dexamethasone, Midazolam and Treatment may vary due to age or medical condition  Airway Management Planned: LMA  Additional Equipment:   Intra-op Plan:   Post-operative Plan:   Informed Consent: I have reviewed the patients History and Physical, chart, labs and discussed the procedure including the risks, benefits and alternatives for the proposed anesthesia with the patient or authorized representative who has indicated his/her understanding and acceptance.     Plan Discussed with: CRNA, Anesthesiologist and Surgeon  Anesthesia Plan Comments:         Anesthesia Quick Evaluation

## 2018-05-27 NOTE — Transfer of Care (Signed)
Immediate Anesthesia Transfer of Care Note  Patient: Danielle [name]  Procedure(s) Performed: CYSTOSCOPY/HYDRODISTENSION AND INSTILLATION (N/A Bladder)  Patient Location: PACU  Anesthesia Type:General  Level of Consciousness: awake, alert , oriented and patient cooperative  Airway & Oxygen Therapy: Patient Spontanous Breathing and Patient connected to nasal cannula oxygen  Post-op Assessment: Report given to RN and Post -op Vital signs reviewed and stable  Post vital signs: Reviewed and stable  Last Vitals:  Vitals Value Taken Time  BP 153/107 05/27/2018  9:35 AM  Temp    Pulse 94 05/27/2018  9:36 AM  Resp 32 05/27/2018  9:36 AM  SpO2 100 % 05/27/2018  9:36 AM  Vitals shown include unvalidated device data.  Last Pain:  Vitals:   05/27/18 0739  TempSrc:   PainSc: 0-No pain      Patients Stated Pain Goal: 5 (23/53/61 [id number])  Complications: No apparent anesthesia complications

## 2018-05-27 NOTE — Anesthesia Procedure Notes (Signed)
Procedure Name: LMA Insertion Date/Time: 05/27/2018 9:11 AM Performed by: Wanita Chamberlain, CRNA Pre-anesthesia Checklist: Patient identified, Timeout performed, Emergency Drugs available, Suction available and Patient being monitored Patient Re-evaluated:Patient Re-evaluated prior to induction Oxygen Delivery Method: Circle system utilized Preoxygenation: Pre-oxygenation with 100% oxygen Induction Type: IV induction Ventilation: Mask ventilation without difficulty LMA: LMA inserted LMA Size: 4.0 Number of attempts: 1 Placement Confirmation: CO2 detector,  positive ETCO2 and breath sounds checked- equal and bilateral Tube secured with: Tape Dental Injury: Teeth and Oropharynx as per pre-operative assessment

## 2018-05-28 ENCOUNTER — Encounter (HOSPITAL_BASED_OUTPATIENT_CLINIC_OR_DEPARTMENT_OTHER): Payer: Self-pay | Admitting: Urology

## 2018-06-02 DIAGNOSIS — F410 Panic disorder [episodic paroxysmal anxiety] without agoraphobia: Secondary | ICD-10-CM | POA: Diagnosis not present

## 2018-06-02 DIAGNOSIS — F4321 Adjustment disorder with depressed mood: Secondary | ICD-10-CM | POA: Diagnosis not present

## 2018-06-02 DIAGNOSIS — F411 Generalized anxiety disorder: Secondary | ICD-10-CM | POA: Diagnosis not present

## 2018-06-05 ENCOUNTER — Encounter (INDEPENDENT_AMBULATORY_CARE_PROVIDER_SITE_OTHER): Payer: Self-pay | Admitting: Orthopedic Surgery

## 2018-06-05 ENCOUNTER — Ambulatory Visit (INDEPENDENT_AMBULATORY_CARE_PROVIDER_SITE_OTHER): Payer: Medicare Other | Admitting: Orthopedic Surgery

## 2018-06-05 DIAGNOSIS — M2021 Hallux rigidus, right foot: Secondary | ICD-10-CM | POA: Diagnosis not present

## 2018-06-05 NOTE — Progress Notes (Signed)
Office Visit Note   Patient: Danielle [name]           Date of Birth: 1971-05-09           MRN: [id number] Visit Date: 06/05/2018              Requested by: Campbell Riches, MD [address], [place] [address] PCP: Campbell Riches, MD  Chief Complaint  Patient presents with  . Right Ankle - Follow-up      HPI: Patient is a 47 year old woman who presents complaining of persistent pain from hallux rigidus in her right foot she states she is worked on heel cord stretching she is worked on stiffer soled shoes and still has pain with activities of daily living.  Assessment & Plan: Visit Diagnoses:  1. Hallux rigidus, right foot     Plan: Discussed options including continued conservative care versus fusion of the great toe MTP joint.  Patient states she would like to proceed with surgery we will set this up at her convenience for outpatient surgery at the surgical center.  Risks and benefits were discussed patient states she understands wished to proceed at this time.  Follow-Up Instructions: Return in about 2 weeks (around 06/19/2018).   Ortho Exam  Patient is alert, oriented, no adenopathy, well-dressed, normal affect, normal respiratory effort. Examination patient has a good pulse good dorsiflexion of her ankle.  The MTP joint has palpable bony spurs dorsally which reproduces her pain with palpation she only has dorsiflexion of about 10 degrees plantarflexion of 0 degrees.  Previous radiographs have shown arthritic changes at the MTP joint.  Imaging: No results found. No images are attached to the encounter.  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 04/27/2015     Lab Results  Component Value Date   ALBUMIN 4.1 02/19/2018   ALBUMIN  3.9 10/16/2017   ALBUMIN 3.9 01/17/2017    There is no height or weight on file to calculate BMI.  Orders:  No orders of the defined types were placed in this encounter.  No orders of the defined types were placed in this encounter.    Procedures: No procedures performed  Clinical Data: No additional findings.  ROS:  All other systems negative, except as noted in the HPI. Review of Systems  Objective: Vital Signs: LMP  (LMP Unknown)   Specialty Comments:  No specialty comments available.  PMFS History: Patient Active Problem List   Diagnosis Date Noted  . Hallux rigidus, right foot 03/11/2018  . 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 Impingement syndrome of right ankle   . Sprain of calcaneofibular ligament of right ankle 07/04/2017  . Ankle pain, right 06/24/2017  . Hyperglycemia 02/18/2017  . Hepatitis B immune 02/18/2017  . 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 S/P total abdominal hysterectomy 06/28/2015  . Abnormal uterine bleeding (AUB) 05/03/2015  . Condyloma acuminatum 02/23/2015  . Fibroids 01/05/2015  . Urinary incontinence 01/05/2015  . Abnormal finding on GI tract imaging 10/12/2013  . Anemia-chronic 10/09/2013  . Achalasia s/p laparoscopic Heller myotomy and Dor fundoplication 74/25/9563  . Ganglion cyst of finger of right hand 07/06/2013  . Trichomoniasis of vagina 06/03/2013  . GERD (gastroesophageal reflux disease) 03/16/2013  . Depression 02/10/2009  . Human  immunodeficiency virus (HIV) disease ([place]) 03/24/2007  . Anxiety state 03/24/2007  . ASTHMA 03/24/2007   Past Medical History:  Diagnosis Date  . Abnormal uterine bleeding (AUB)   . Achalasia    Type 2  . Anemia   . Anxiety   . Asthma   . Bipolar disorder ([place])    Mixed  . Bulging lumbar disc   . Chronic back pain   . Condyloma acuminata   . Depression   . 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 Ganglion cyst    Right hand  . Genital HSV   . GERD (gastroesophageal  reflux disease)   . Hepatitis    States she got a shot for this  . HIV infection ([place])   . Migraines   . Pneumonia   . Pre-diabetes   . Trichimoniasis   . Urinary incontinence   . Uterine fibroid   . Wears glasses     Family History  Problem Relation Age of Onset  . Hyperlipidemia Mother   . Diabetes Mother   . Stroke Mother   . Diabetes Brother   . Colon cancer Father   . Esophageal cancer Neg Hx   . Rectal cancer Neg Hx   . Stomach cancer Neg Hx     Past Surgical History:  Procedure Laterality Date  . ABDOMINAL HYSTERECTOMY N/A 06/28/2015   Procedure: HYSTERECTOMY ABDOMINAL;  Surgeon: Osborne Oman, MD;  Location: [facility];  Service: Gynecology;  Laterality: N/A;  Requested 06/28/15 @ 7:30a  . ANKLE ARTHROSCOPY Right 10/16/2017   Procedure: RIGHT ANKLE ARTHROSCOPIC DEBRIDEMENT;  Surgeon: Newt Minion, MD;  Location: [facility];  Service: Orthopedics;  Laterality: Right;  . BILATERAL SALPINGECTOMY Bilateral 06/28/2015   Procedure: BILATERAL SALPINGECTOMY;  Surgeon: Osborne Oman, MD;  Location: [facility];  Service: Gynecology;  Laterality: Bilateral;  . CESAREAN SECTION     twice;  vertical incision x 2  . COLONOSCOPY N/A 10/12/2013   Procedure: COLONOSCOPY;  Surgeon: Jerene Bears, MD;  Location: [facility];  Service: Endoscopy;  Laterality: N/A;  . CYSTO WITH HYDRODISTENSION N/A 05/27/2018   Procedure: CYSTOSCOPY/HYDRODISTENSION AND INSTILLATION;  Surgeon: Bjorn Loser, MD;  Location[facility];  Service: Urology;  Laterality: N/A;  . ESOPHAGEAL MANOMETRY N/A 06/29/2013   Procedure: ESOPHAGEAL MANOMETRY (EM);  Surgeon: Jerene Bears, MD;  Location: [facility];  Service: Gastroenterology;  Laterality: N/A;  . FOREIGN BODY REMOVAL Left 2018   fiberglass, left thumb  . HELLER MYOTOMY N/A 10/08/2013   Procedure: LAPAROSCOPIC HELLER MYOTOMY, DOR FUNDIPLICATION, UPPER ENDOSCOPY;  Surgeon: Odis Hollingshead, MD;  Location: [facility];  Service: General;  Laterality:  N/A;  . HERNIA REPAIR     Umbilical  . HYSTEROSCOPY WITH RESECTOSCOPE N/A 05/03/2015   Procedure: Diagnostic Hysteroscopy;  Surgeon: Lavonia Drafts, MD;  Location: [facility];  Service: Gynecology;  Laterality: N/A;  wants Novasure and myosure.  Marland Kitchen LYSIS OF ADHESION  06/28/2015   Procedure: LYSIS OF ADHESION;  Surgeon: Osborne Oman, MD;  Location: [facility];  Service: Gynecology;;  . OTHER SURGICAL HISTORY     biopsy axillary ,right arm  . right arm biopsy    . UPPER GI ENDOSCOPY N/A 10/08/2013   Procedure: UPPER GI ENDOSCOPY;  Surgeon: Odis Hollingshead, MD;  Location: [facility];  Service: General;  Laterality: N/A;  . WISDOM TOOTH EXTRACTION     Social History   Occupational History  . Occupation: House wife  Tobacco Use  . Smoking status:  Never Smoker  . Smokeless tobacco: Never Used  Substance and Sexual Activity  . Alcohol use: No  . Drug use: No  . Sexual activity: Not Currently    Partners: Male    Birth control/protection: Surgical    Comment: pt. given condoms

## 2018-06-06 DIAGNOSIS — F411 Generalized anxiety disorder: Secondary | ICD-10-CM | POA: Diagnosis not present

## 2018-06-06 DIAGNOSIS — F3132 Bipolar disorder, current episode depressed, moderate: Secondary | ICD-10-CM | POA: Diagnosis not present

## 2018-06-13 ENCOUNTER — Emergency Department (HOSPITAL_COMMUNITY): Payer: No Typology Code available for payment source

## 2018-06-13 ENCOUNTER — Encounter (HOSPITAL_COMMUNITY): Payer: Self-pay

## 2018-06-13 ENCOUNTER — Emergency Department (HOSPITAL_COMMUNITY)
Admission: EM | Admit: 2018-06-13 | Discharge: 2018-06-13 | Disposition: A | Discharge status: Home / Self Care | Payer: No Typology Code available for payment source | Attending: Emergency Medicine | Admitting: Emergency Medicine

## 2018-06-13 DIAGNOSIS — Y939 Activity, unspecified: Secondary | ICD-10-CM | POA: Insufficient documentation

## 2018-06-13 DIAGNOSIS — R109 Unspecified abdominal pain: Secondary | ICD-10-CM | POA: Insufficient documentation

## 2018-06-13 DIAGNOSIS — Z91040 Latex allergy status: Secondary | ICD-10-CM | POA: Insufficient documentation

## 2018-06-13 DIAGNOSIS — Y92410 Unspecified street and highway as the place of occurrence of the external cause: Secondary | ICD-10-CM | POA: Diagnosis not present

## 2018-06-13 DIAGNOSIS — S3991XA Unspecified injury of abdomen, initial encounter: Secondary | ICD-10-CM | POA: Diagnosis not present

## 2018-06-13 DIAGNOSIS — S61412A Laceration without foreign body of left hand, initial encounter: Secondary | ICD-10-CM | POA: Diagnosis not present

## 2018-06-13 DIAGNOSIS — M25512 Pain in left shoulder: Secondary | ICD-10-CM | POA: Diagnosis not present

## 2018-06-13 DIAGNOSIS — J45909 Unspecified asthma, uncomplicated: Secondary | ICD-10-CM | POA: Insufficient documentation

## 2018-06-13 DIAGNOSIS — S4992XA Unspecified injury of left shoulder and upper arm, initial encounter: Secondary | ICD-10-CM | POA: Diagnosis not present

## 2018-06-13 DIAGNOSIS — B20 Human immunodeficiency virus [HIV] disease: Secondary | ICD-10-CM | POA: Diagnosis not present

## 2018-06-13 DIAGNOSIS — M542 Cervicalgia: Secondary | ICD-10-CM | POA: Diagnosis present

## 2018-06-13 DIAGNOSIS — R079 Chest pain, unspecified: Secondary | ICD-10-CM | POA: Diagnosis not present

## 2018-06-13 DIAGNOSIS — Y998 Other external cause status: Secondary | ICD-10-CM | POA: Insufficient documentation

## 2018-06-13 DIAGNOSIS — Z79899 Other long term (current) drug therapy: Secondary | ICD-10-CM | POA: Insufficient documentation

## 2018-06-13 DIAGNOSIS — S299XXA Unspecified injury of thorax, initial encounter: Secondary | ICD-10-CM | POA: Diagnosis not present

## 2018-06-13 DIAGNOSIS — S199XXA Unspecified injury of neck, initial encounter: Secondary | ICD-10-CM | POA: Diagnosis not present

## 2018-06-13 LAB — I-STAT CHEM 8, ED
BUN: 17 mg/dL (ref 6–20)
CHLORIDE: 107 mmol/L (ref 98–111)
Calcium, Ion: 1.21 mmol/L (ref 1.15–1.40)
Creatinine, Ser: 1 mg/dL (ref 0.44–1.00)
GLUCOSE: 74 mg/dL (ref 70–99)
HEMATOCRIT: 42 % (ref 36.0–46.0)
HEMOGLOBIN: 14.3 g/dL (ref 12.0–15.0)
POTASSIUM: 3.6 mmol/L (ref 3.5–5.1)
SODIUM: 140 mmol/L (ref 135–145)
TCO2: 24 mmol/L (ref 22–32)

## 2018-06-13 MED ORDER — MORPHINE SULFATE (PF) 4 MG/ML IV SOLN
4.0000 mg | Freq: Once | INTRAVENOUS | Status: AC
  Administered 2018-06-13: 4 mg via INTRAVENOUS
  Filled 2018-06-13: qty 1

## 2018-06-13 MED ORDER — CYCLOBENZAPRINE HCL 10 MG PO TABS: 10.0000 mg | ORAL_TABLET | Freq: Two times a day (BID) | ORAL | 0 refills | Status: DC | PRN

## 2018-06-13 MED ORDER — ONDANSETRON HCL 4 MG/2ML IJ SOLN
4.0000 mg | Freq: Once | INTRAMUSCULAR | Status: AC
  Administered 2018-06-13: 4 mg via INTRAVENOUS
  Filled 2018-06-13: qty 2

## 2018-06-13 MED ORDER — IBUPROFEN 600 MG PO TABS: 600.0000 mg | ORAL_TABLET | Freq: Four times a day (QID) | ORAL | 0 refills | Status: DC | PRN

## 2018-06-13 MED ORDER — IOHEXOL 300 MG/ML  SOLN
100.0000 mL | Freq: Once | INTRAMUSCULAR | Status: AC | PRN
  Administered 2018-06-13: 100 mL via INTRAVENOUS

## 2018-06-13 NOTE — ED Notes (Signed)
Patient transported to X-ray 
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
## 2018-06-13 NOTE — ED Notes (Signed)
ED Provider at bedside. 

## 2018-06-13 NOTE — ED Notes (Signed)
Patient's husband up to desk, asked to have wife assessed.  States he notes the laceration in her left hand has signs of foreign body.  Xray ordered and they were reassured by tech and RN

## 2018-06-16 DIAGNOSIS — F4321 Adjustment disorder with depressed mood: Secondary | ICD-10-CM | POA: Diagnosis not present

## 2018-06-16 DIAGNOSIS — F411 Generalized anxiety disorder: Secondary | ICD-10-CM | POA: Diagnosis not present

## 2018-06-16 DIAGNOSIS — F410 Panic disorder [episodic paroxysmal anxiety] without agoraphobia: Secondary | ICD-10-CM | POA: Diagnosis not present

## 2018-06-18 ENCOUNTER — Encounter (HOSPITAL_COMMUNITY): Payer: Self-pay | Admitting: Emergency Medicine

## 2018-06-18 ENCOUNTER — Ambulatory Visit (HOSPITAL_COMMUNITY)
Admission: EM | Admit: 2018-06-18 | Discharge: 2018-06-18 | Disposition: A | Discharge status: Home / Self Care | Payer: No Typology Code available for payment source | Attending: Family Medicine | Admitting: Family Medicine

## 2018-06-18 ENCOUNTER — Telehealth (INDEPENDENT_AMBULATORY_CARE_PROVIDER_SITE_OTHER): Payer: Self-pay | Admitting: Orthopedic Surgery

## 2018-06-18 DIAGNOSIS — M791 Myalgia, unspecified site: Secondary | ICD-10-CM | POA: Diagnosis not present

## 2018-06-18 DIAGNOSIS — M545 Low back pain, unspecified: Secondary | ICD-10-CM

## 2018-06-18 DIAGNOSIS — S161XXA Strain of muscle, fascia and tendon at neck level, initial encounter: Secondary | ICD-10-CM | POA: Diagnosis not present

## 2018-06-18 DIAGNOSIS — R51 Headache: Secondary | ICD-10-CM | POA: Diagnosis not present

## 2018-06-18 DIAGNOSIS — R0789 Other chest pain: Secondary | ICD-10-CM | POA: Diagnosis not present

## 2018-06-18 DIAGNOSIS — M7918 Myalgia, other site: Secondary | ICD-10-CM

## 2018-06-18 MED ORDER — KETOROLAC TROMETHAMINE 30 MG/ML IJ SOLN
30.0000 mg | Freq: Once | INTRAMUSCULAR | Status: AC
  Administered 2018-06-18: 30 mg via INTRAMUSCULAR

## 2018-06-18 MED ORDER — TRIAMCINOLONE ACETONIDE 0.1 % EX CREA: 1.0000 "application " | TOPICAL_CREAM | Freq: Two times a day (BID) | CUTANEOUS | 0 refills | Status: DC

## 2018-06-18 MED ORDER — CYCLOBENZAPRINE HCL 10 MG PO TABS: 10.0000 mg | ORAL_TABLET | Freq: Two times a day (BID) | ORAL | 0 refills | Status: DC | PRN

## 2018-06-18 MED ORDER — KETOROLAC TROMETHAMINE 30 MG/ML IJ SOLN
INTRAMUSCULAR | Status: AC
  Filled 2018-06-18: qty 1
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
## 2018-06-19 DIAGNOSIS — R350 Frequency of micturition: Secondary | ICD-10-CM | POA: Diagnosis not present

## 2018-06-19 DIAGNOSIS — R351 Nocturia: Secondary | ICD-10-CM | POA: Diagnosis not present
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
## 2018-06-23 ENCOUNTER — Encounter (HOSPITAL_COMMUNITY): Payer: Self-pay | Admitting: Urgent Care

## 2018-06-23 ENCOUNTER — Ambulatory Visit (HOSPITAL_COMMUNITY)
Admission: EM | Admit: 2018-06-23 | Discharge: 2018-06-23 | Disposition: A | Discharge status: Home / Self Care | Payer: Medicare Other | Attending: Urgent Care | Admitting: Urgent Care

## 2018-06-23 DIAGNOSIS — M545 Low back pain, unspecified: Secondary | ICD-10-CM

## 2018-06-23 DIAGNOSIS — F4321 Adjustment disorder with depressed mood: Secondary | ICD-10-CM | POA: Diagnosis not present

## 2018-06-23 DIAGNOSIS — M62838 Other muscle spasm: Secondary | ICD-10-CM | POA: Diagnosis not present

## 2018-06-23 DIAGNOSIS — F411 Generalized anxiety disorder: Secondary | ICD-10-CM | POA: Diagnosis not present

## 2018-06-23 DIAGNOSIS — F410 Panic disorder [episodic paroxysmal anxiety] without agoraphobia: Secondary | ICD-10-CM | POA: Diagnosis not present

## 2018-06-23 DIAGNOSIS — M542 Cervicalgia: Secondary | ICD-10-CM

## 2018-06-23 MED ORDER — MELOXICAM 7.5 MG PO TABS: 7.5000 mg | ORAL_TABLET | Freq: Every day | ORAL | 2 refills | Status: DC
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
## 2018-06-23 NOTE — Discharge Instructions (Addendum)
For a consult of physical therapy, please contact [facility].  [phone] Please call Orvan July, Office Manager to set up an appointment  Take 1 (7.5mg ) tablet of meloxicam daily. If your pain is really bothering you on a specific day, you can use 2 tablets of meloxicam but do not exceed this in one day. Do not take ibuprofen, naproxen, Advil, Motrin while taking meloxicam. Use Flexeril 1/2-1 tablet at bedtime. Hydrate well with at least 2 liters (64 ounces) of water daily.

## 2018-06-30 ENCOUNTER — Encounter: Payer: Self-pay | Admitting: Infectious Diseases

## 2018-06-30 ENCOUNTER — Ambulatory Visit (INDEPENDENT_AMBULATORY_CARE_PROVIDER_SITE_OTHER): Payer: Medicare Other | Admitting: Infectious Diseases

## 2018-06-30 VITALS — BP 133/80 | HR 82 | Temp 98.1°F | Wt 176.0 lb

## 2018-06-30 DIAGNOSIS — D249 Benign neoplasm of unspecified breast: Secondary | ICD-10-CM

## 2018-06-30 DIAGNOSIS — Z79899 Other long term (current) drug therapy: Secondary | ICD-10-CM

## 2018-06-30 DIAGNOSIS — B20 Human immunodeficiency virus [HIV] disease: Secondary | ICD-10-CM | POA: Diagnosis not present

## 2018-06-30 DIAGNOSIS — Z113 Encounter for screening for infections with a predominantly sexual mode of transmission: Secondary | ICD-10-CM | POA: Diagnosis not present

## 2018-06-30 DIAGNOSIS — F411 Generalized anxiety disorder: Secondary | ICD-10-CM

## 2018-06-30 NOTE — Addendum Note (Signed)
Addended byMeriel Pica F on: 06/30/2018 11:31 AM   Modules accepted: Orders

## 2018-06-30 NOTE — Assessment & Plan Note (Signed)
Will get her in with regina.
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
## 2018-06-30 NOTE — Assessment & Plan Note (Signed)
She is doing well lab today.  Partner on prep.  Refuses flu shot.  rtc in 9 months.

## 2018-06-30 NOTE — Assessment & Plan Note (Signed)
Will order repeat mammo

## 2018-07-01 DIAGNOSIS — F4321 Adjustment disorder with depressed mood: Secondary | ICD-10-CM | POA: Diagnosis not present

## 2018-07-01 DIAGNOSIS — F410 Panic disorder [episodic paroxysmal anxiety] without agoraphobia: Secondary | ICD-10-CM | POA: Diagnosis not present

## 2018-07-01 DIAGNOSIS — F411 Generalized anxiety disorder: Secondary | ICD-10-CM | POA: Diagnosis not present

## 2018-07-01 LAB — T-HELPER CELL (CD4) - (RCID CLINIC ONLY)
CD4 T CELL HELPER: 32 % — AB (ref 33–55)
CD4 T Cell Abs: 530 /uL (ref 400–2700)

## 2018-07-02 LAB — HIV-1 RNA QUANT-NO REFLEX-BLD
HIV 1 RNA Quant: <20 copies/mL
HIV-1 RNA Quant, Log: <1.3 Log copies/mL

## 2018-07-02 LAB — CBC
HCT: 35.7 % (ref 35.0–45.0)
Hemoglobin: 12 g/dL (ref 11.7–15.5)
MCH: 29.3 pg (ref 27.0–33.0)
MCHC: 33.6 g/dL (ref 32.0–36.0)
MCV: 87.3 fL (ref 80.0–100.0)
MPV: 11.1 fL (ref 7.5–12.5)
Platelets: 236 10*3/uL (ref 140–400)
RBC: 4.09 10*6/uL (ref 3.80–5.10)
RDW: 13 % (ref 11.0–15.0)
WBC: 3.9 10*3/uL (ref 3.8–10.8)

## 2018-07-02 LAB — COMPREHENSIVE METABOLIC PANEL
AG RATIO: 1.5 (calc) (ref 1.0–2.5)
ALKALINE PHOSPHATASE (APISO): 96 U/L (ref 33–115)
ALT: 12 U/L (ref 6–29)
AST: 15 U/L (ref 10–35)
Albumin: 4.3 g/dL (ref 3.6–5.1)
BILIRUBIN TOTAL: 0.3 mg/dL (ref 0.2–1.2)
BUN/Creatinine Ratio: 10 (calc) (ref 6–22)
BUN: 12 mg/dL (ref 7–25)
CALCIUM: 9.5 mg/dL (ref 8.6–10.2)
CO2: 27 mmol/L (ref 20–32)
Chloride: 105 mmol/L (ref 98–110)
Creat: 1.18 mg/dL — ABNORMAL HIGH (ref 0.50–1.10)
GLUCOSE: 78 mg/dL (ref 65–99)
Globulin: 2.8 g/dL (calc) (ref 1.9–3.7)
Potassium: 4.3 mmol/L (ref 3.5–5.3)
Sodium: 139 mmol/L (ref 135–146)
Total Protein: 7.1 g/dL (ref 6.1–8.1)

## 2018-07-05 ENCOUNTER — Other Ambulatory Visit: Payer: Self-pay | Admitting: Infectious Disease

## 2018-07-07 ENCOUNTER — Telehealth (INDEPENDENT_AMBULATORY_CARE_PROVIDER_SITE_OTHER): Payer: Self-pay | Admitting: Orthopedic Surgery
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
## 2018-07-09 ENCOUNTER — Encounter (INDEPENDENT_AMBULATORY_CARE_PROVIDER_SITE_OTHER): Payer: Self-pay | Admitting: Family

## 2018-07-09 ENCOUNTER — Ambulatory Visit (INDEPENDENT_AMBULATORY_CARE_PROVIDER_SITE_OTHER): Payer: Medicare Other | Admitting: Family

## 2018-07-09 VITALS — Ht 61.0 in | Wt 176.0 lb

## 2018-07-09 DIAGNOSIS — M25571 Pain in right ankle and joints of right foot: Secondary | ICD-10-CM

## 2018-07-09 MED ORDER — PREDNISONE 10 MG PO TABS: 20.0000 mg | ORAL_TABLET | Freq: Every day | ORAL | 0 refills | Status: DC
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
## 2018-07-15 ENCOUNTER — Ambulatory Visit (INDEPENDENT_AMBULATORY_CARE_PROVIDER_SITE_OTHER): Payer: Self-pay | Admitting: Physician Assistant

## 2018-07-15 DIAGNOSIS — F410 Panic disorder [episodic paroxysmal anxiety] without agoraphobia: Secondary | ICD-10-CM | POA: Diagnosis not present

## 2018-07-15 DIAGNOSIS — F411 Generalized anxiety disorder: Secondary | ICD-10-CM | POA: Diagnosis not present

## 2018-07-15 DIAGNOSIS — F4321 Adjustment disorder with depressed mood: Secondary | ICD-10-CM | POA: Diagnosis not present

## 2018-07-16 DIAGNOSIS — F410 Panic disorder [episodic paroxysmal anxiety] without agoraphobia: Secondary | ICD-10-CM | POA: Diagnosis not present

## 2018-07-16 DIAGNOSIS — F4321 Adjustment disorder with depressed mood: Secondary | ICD-10-CM | POA: Diagnosis not present

## 2018-07-16 DIAGNOSIS — F411 Generalized anxiety disorder: Secondary | ICD-10-CM | POA: Diagnosis not present

## 2018-07-17 DIAGNOSIS — F3132 Bipolar disorder, current episode depressed, moderate: Secondary | ICD-10-CM | POA: Diagnosis not present

## 2018-07-17 DIAGNOSIS — F411 Generalized anxiety disorder: Secondary | ICD-10-CM | POA: Diagnosis not present

## 2018-07-19 ENCOUNTER — Encounter (HOSPITAL_COMMUNITY): Payer: Self-pay | Admitting: *Deleted

## 2018-07-19 NOTE — Progress Notes (Signed)
Denies CP, Shob, or cardiology visits/ test.

## 2018-07-23 ENCOUNTER — Ambulatory Visit (HOSPITAL_COMMUNITY)
Admission: RE | Admit: 2018-07-23 | Discharge: 2018-07-23 | Disposition: A | Discharge status: Home / Self Care | Payer: Medicare Other | Source: Ambulatory Visit | Attending: Orthopedic Surgery | Admitting: Orthopedic Surgery

## 2018-07-23 ENCOUNTER — Ambulatory Visit (HOSPITAL_COMMUNITY): Payer: Medicare Other | Admitting: Certified Registered"

## 2018-07-23 ENCOUNTER — Other Ambulatory Visit: Payer: Self-pay

## 2018-07-23 ENCOUNTER — Encounter (HOSPITAL_COMMUNITY): Payer: Self-pay

## 2018-07-23 ENCOUNTER — Encounter (HOSPITAL_COMMUNITY)
Admission: RE | Disposition: A | Discharge status: Home / Self Care | Payer: Self-pay | Source: Ambulatory Visit | Attending: Orthopedic Surgery

## 2018-07-23 DIAGNOSIS — K219 Gastro-esophageal reflux disease without esophagitis: Secondary | ICD-10-CM | POA: Insufficient documentation

## 2018-07-23 DIAGNOSIS — M549 Dorsalgia, unspecified: Secondary | ICD-10-CM | POA: Insufficient documentation

## 2018-07-23 DIAGNOSIS — M778 Other enthesopathies, not elsewhere classified: Secondary | ICD-10-CM | POA: Insufficient documentation

## 2018-07-23 DIAGNOSIS — G8929 Other chronic pain: Secondary | ICD-10-CM | POA: Insufficient documentation

## 2018-07-23 DIAGNOSIS — M2021 Hallux rigidus, right foot: Secondary | ICD-10-CM | POA: Insufficient documentation

## 2018-07-23 DIAGNOSIS — E669 Obesity, unspecified: Secondary | ICD-10-CM | POA: Diagnosis not present

## 2018-07-23 DIAGNOSIS — Z21 Asymptomatic human immunodeficiency virus [HIV] infection status: Secondary | ICD-10-CM | POA: Insufficient documentation

## 2018-07-23 DIAGNOSIS — M5126 Other intervertebral disc displacement, lumbar region: Secondary | ICD-10-CM | POA: Diagnosis not present

## 2018-07-23 DIAGNOSIS — Z8249 Family history of ischemic heart disease and other diseases of the circulatory system: Secondary | ICD-10-CM | POA: Diagnosis not present

## 2018-07-23 DIAGNOSIS — Z90710 Acquired absence of both cervix and uterus: Secondary | ICD-10-CM | POA: Diagnosis not present

## 2018-07-23 DIAGNOSIS — G43909 Migraine, unspecified, not intractable, without status migrainosus: Secondary | ICD-10-CM | POA: Diagnosis not present

## 2018-07-23 DIAGNOSIS — K220 Achalasia of cardia: Secondary | ICD-10-CM | POA: Diagnosis not present

## 2018-07-23 DIAGNOSIS — R7303 Prediabetes: Secondary | ICD-10-CM | POA: Insufficient documentation

## 2018-07-23 DIAGNOSIS — F319 Bipolar disorder, unspecified: Secondary | ICD-10-CM | POA: Diagnosis not present

## 2018-07-23 DIAGNOSIS — Z833 Family history of diabetes mellitus: Secondary | ICD-10-CM | POA: Insufficient documentation

## 2018-07-23 DIAGNOSIS — F419 Anxiety disorder, unspecified: Secondary | ICD-10-CM | POA: Insufficient documentation

## 2018-07-23 DIAGNOSIS — Z79899 Other long term (current) drug therapy: Secondary | ICD-10-CM | POA: Insufficient documentation

## 2018-07-23 DIAGNOSIS — Z800 Family history of malignant neoplasm of digestive organs: Secondary | ICD-10-CM | POA: Insufficient documentation

## 2018-07-23 DIAGNOSIS — D649 Anemia, unspecified: Secondary | ICD-10-CM | POA: Diagnosis not present

## 2018-07-23 DIAGNOSIS — Z6832 Body mass index (BMI) 32.0-32.9, adult: Secondary | ICD-10-CM | POA: Insufficient documentation

## 2018-07-23 DIAGNOSIS — J45909 Unspecified asthma, uncomplicated: Secondary | ICD-10-CM | POA: Insufficient documentation

## 2018-07-23 HISTORY — PX: ARTHRODESIS METATARSALPHALANGEAL JOINT (MTPJ): SHX6566

## 2018-07-23 LAB — CBC
HCT: 38.9 % (ref 36.0–46.0)
HEMOGLOBIN: 12.1 g/dL (ref 12.0–15.0)
MCH: 29.1 pg (ref 26.0–34.0)
MCHC: 31.1 g/dL (ref 30.0–36.0)
MCV: 93.5 fL (ref 80.0–100.0)
NRBC: 0 % (ref 0.0–0.2)
Platelets: 186 10*3/uL (ref 150–400)
RBC: 4.16 MIL/uL (ref 3.87–5.11)
RDW: 13.3 % (ref 11.5–15.5)
WBC: 5.9 10*3/uL (ref 4.0–10.5)

## 2018-07-23 LAB — COMPREHENSIVE METABOLIC PANEL
ALBUMIN: 3.6 g/dL (ref 3.5–5.0)
ALK PHOS: 90 U/L (ref 38–126)
ALT: 18 U/L (ref 0–44)
AST: 17 U/L (ref 15–41)
Anion gap: 4 — ABNORMAL LOW (ref 5–15)
BILIRUBIN TOTAL: 0.5 mg/dL (ref 0.3–1.2)
BUN: 14 mg/dL (ref 6–20)
CALCIUM: 8.9 mg/dL (ref 8.9–10.3)
CO2: 25 mmol/L (ref 22–32)
Chloride: 108 mmol/L (ref 98–111)
Creatinine, Ser: 1.14 mg/dL — ABNORMAL HIGH (ref 0.44–1.00)
GFR calc Af Amer: >60 mL/min (ref >60)
GFR calc non Af Amer: 57 mL/min — ABNORMAL LOW (ref >60)
Glucose, Bld: 113 mg/dL — ABNORMAL HIGH (ref 70–99)
Potassium: 3.5 mmol/L (ref 3.5–5.1)
Sodium: 137 mmol/L (ref 135–145)
TOTAL PROTEIN: 6.5 g/dL (ref 6.5–8.1)

## 2018-07-23 SURGERY — FUSION, JOINT, GREAT TOE
Anesthesia: Regional | Laterality: Right

## 2018-07-23 MED ORDER — DEXAMETHASONE SODIUM PHOSPHATE 10 MG/ML IJ SOLN
INTRAMUSCULAR | Status: DC | PRN
  Administered 2018-07-23: 10 mg via INTRAVENOUS

## 2018-07-23 MED ORDER — LIDOCAINE 2% (20 MG/ML) 5 ML SYRINGE
INTRAMUSCULAR | Status: DC | PRN
  Administered 2018-07-23: 80 mg via INTRAVENOUS

## 2018-07-23 MED ORDER — FENTANYL CITRATE (PF) 250 MCG/5ML IJ SOLN
INTRAMUSCULAR | Status: AC
  Filled 2018-07-23: qty 5

## 2018-07-23 MED ORDER — ONDANSETRON HCL 4 MG/2ML IJ SOLN
INTRAMUSCULAR | Status: DC | PRN
  Administered 2018-07-23: 4 mg via INTRAVENOUS

## 2018-07-23 MED ORDER — MEPERIDINE HCL 50 MG/ML IJ SOLN: 6.2500 mg | INTRAMUSCULAR | Status: DC | PRN

## 2018-07-23 MED ORDER — LIDOCAINE 2% (20 MG/ML) 5 ML SYRINGE
INTRAMUSCULAR | Status: AC
  Filled 2018-07-23: qty 5

## 2018-07-23 MED ORDER — MIDAZOLAM HCL 2 MG/2ML IJ SOLN
INTRAMUSCULAR | Status: DC | PRN
  Administered 2018-07-23: 2 mg via INTRAVENOUS

## 2018-07-23 MED ORDER — OXYCODONE HCL 5 MG PO TABS
5.0000 mg | ORAL_TABLET | Freq: Once | ORAL | Status: AC | PRN
  Administered 2018-07-23: 5 mg via ORAL

## 2018-07-23 MED ORDER — MIDAZOLAM HCL 2 MG/2ML IJ SOLN
INTRAMUSCULAR | Status: AC
  Filled 2018-07-23: qty 2

## 2018-07-23 MED ORDER — PROPOFOL 10 MG/ML IV BOLUS
INTRAVENOUS | Status: AC
  Filled 2018-07-23: qty 20

## 2018-07-23 MED ORDER — HYDROMORPHONE HCL 1 MG/ML IJ SOLN
0.2500 mg | INTRAMUSCULAR | Status: DC | PRN
  Administered 2018-07-23 (×4): 0.5 mg via INTRAVENOUS

## 2018-07-23 MED ORDER — CHLORHEXIDINE GLUCONATE 4 % EX LIQD: 60.0000 mL | Freq: Once | CUTANEOUS | Status: DC

## 2018-07-23 MED ORDER — OXYCODONE HCL 5 MG/5ML PO SOLN: 5.0000 mg | Freq: Once | ORAL | Status: AC | PRN

## 2018-07-23 MED ORDER — HYDROCODONE-ACETAMINOPHEN 5-325 MG PO TABS: 1.0000 | ORAL_TABLET | ORAL | 0 refills | Status: DC | PRN

## 2018-07-23 MED ORDER — HYDROMORPHONE HCL 1 MG/ML IJ SOLN
INTRAMUSCULAR | Status: AC
  Filled 2018-07-23: qty 1

## 2018-07-23 MED ORDER — LACTATED RINGERS IV SOLN
INTRAVENOUS | Status: DC | PRN
  Administered 2018-07-23: 08:00:00 via INTRAVENOUS

## 2018-07-23 MED ORDER — ONDANSETRON HCL 4 MG/2ML IJ SOLN
INTRAMUSCULAR | Status: AC
  Filled 2018-07-23: qty 2

## 2018-07-23 MED ORDER — CEFAZOLIN SODIUM-DEXTROSE 2-4 GM/100ML-% IV SOLN
2.0000 g | INTRAVENOUS | Status: AC
  Administered 2018-07-23: 2 g via INTRAVENOUS
  Filled 2018-07-23: qty 100

## 2018-07-23 MED ORDER — DEXAMETHASONE SODIUM PHOSPHATE 10 MG/ML IJ SOLN
INTRAMUSCULAR | Status: AC
  Filled 2018-07-23: qty 1

## 2018-07-23 MED ORDER — FENTANYL CITRATE (PF) 250 MCG/5ML IJ SOLN
INTRAMUSCULAR | Status: DC | PRN
  Administered 2018-07-23: 100 ug via INTRAVENOUS
  Administered 2018-07-23: 50 ug via INTRAVENOUS

## 2018-07-23 MED ORDER — 0.9 % SODIUM CHLORIDE (POUR BTL) OPTIME
TOPICAL | Status: DC | PRN
  Administered 2018-07-23: 1000 mL

## 2018-07-23 MED ORDER — OXYCODONE HCL 5 MG PO TABS
ORAL_TABLET | ORAL | Status: AC
  Filled 2018-07-23: qty 1

## 2018-07-23 MED ORDER — PROMETHAZINE HCL 25 MG/ML IJ SOLN: 6.2500 mg | INTRAMUSCULAR | Status: DC | PRN

## 2018-07-23 MED ORDER — PROPOFOL 10 MG/ML IV BOLUS
INTRAVENOUS | Status: DC | PRN
  Administered 2018-07-23: 170 mg via INTRAVENOUS

## 2018-07-23 SURGICAL SUPPLY — 50 items
BIT DRILL QC 2X125 (BIT) IMPLANT
BLADE AVERAGE 25MMX9MM (BLADE)
BLADE AVERAGE 25X9 (BLADE) IMPLANT
BLADE MINI RND TIP GREEN BEAV (BLADE) IMPLANT
BNDG COHESIVE 1X5 TAN STRL LF (GAUZE/BANDAGES/DRESSINGS) IMPLANT
BNDG COHESIVE 6X5 TAN STRL LF (GAUZE/BANDAGES/DRESSINGS) ×2 IMPLANT
BNDG GAUZE ELAST 4 BULKY (GAUZE/BANDAGES/DRESSINGS) ×2 IMPLANT
BNDG GAUZE STRTCH 6 (GAUZE/BANDAGES/DRESSINGS) IMPLANT
COTTON STERILE ROLL (GAUZE/BANDAGES/DRESSINGS) IMPLANT
COVER SURGICAL LIGHT HANDLE (MISCELLANEOUS) ×6 IMPLANT
CUFF TOURNIQUET SINGLE 18IN (TOURNIQUET CUFF) IMPLANT
CUFF TOURNIQUET SINGLE 24IN (TOURNIQUET CUFF) IMPLANT
CUFF TOURNIQUET SINGLE 34IN LL (TOURNIQUET CUFF) IMPLANT
CUFF TOURNIQUET SINGLE 44IN (TOURNIQUET CUFF) IMPLANT
DRAPE OEC MINIVIEW 54X84 (DRAPES) IMPLANT
DRAPE U-SHAPE 47X51 STRL (DRAPES) ×3 IMPLANT
DRILL BIT QC 2X125 (BIT) ×2
DRSG ADAPTIC 3X8 NADH LF (GAUZE/BANDAGES/DRESSINGS) IMPLANT
DURAPREP 26ML APPLICATOR (WOUND CARE) ×3 IMPLANT
ELECT REM PT RETURN 9FT ADLT (ELECTROSURGICAL) ×3
ELECTRODE REM PT RTRN 9FT ADLT (ELECTROSURGICAL) ×1 IMPLANT
GAUZE SPONGE 4X4 12PLY STRL LF (GAUZE/BANDAGES/DRESSINGS) ×2 IMPLANT
GAUZE XEROFORM 1X8 LF (GAUZE/BANDAGES/DRESSINGS) ×2 IMPLANT
GLOVE BIOGEL PI IND STRL 9 (GLOVE) ×1 IMPLANT
GLOVE BIOGEL PI INDICATOR 9 (GLOVE) ×2
GLOVE SURG ORTHO 9.0 STRL STRW (GLOVE) ×3 IMPLANT
GOWN STRL REUS W/ TWL XL LVL3 (GOWN DISPOSABLE) ×2 IMPLANT
GOWN STRL REUS W/TWL XL LVL3 (GOWN DISPOSABLE) ×6
KIT BASIN OR (CUSTOM PROCEDURE TRAY) ×3 IMPLANT
KIT TURNOVER KIT B (KITS) ×3 IMPLANT
KWIRE 1.6 ×2 IMPLANT
MANIFOLD NEPTUNE II (INSTRUMENTS) ×3 IMPLANT
NDL HYPO 25GX1X1/2 BEV (NEEDLE) IMPLANT
NEEDLE HYPO 25GX1X1/2 BEV (NEEDLE) IMPLANT
NS IRRIG 1000ML POUR BTL (IV SOLUTION) ×3 IMPLANT
PACK ORTHO EXTREMITY (CUSTOM PROCEDURE TRAY) ×3 IMPLANT
PAD ARMBOARD 7.5X6 YLW CONV (MISCELLANEOUS) ×6 IMPLANT
PAD CAST 4YDX4 CTTN HI CHSV (CAST SUPPLIES) IMPLANT
PADDING CAST COTTON 4X4 STRL (CAST SUPPLIES)
PLATE FUSION MTP 42MML (Plate) ×2 IMPLANT
SCREW CORTEX 2.7 SLF-TPNG 16MM (Screw) ×2 IMPLANT
SCREW CORTEX 2.7 SLF-TPNG 18MM (Screw) ×2 IMPLANT
SCREW LOCK VA ST 2.7X14 (Screw) ×6 IMPLANT
SCREW LOCKING 2.7X16MM VA (Screw) ×4 IMPLANT
SUCTION FRAZIER HANDLE 10FR (MISCELLANEOUS)
SUCTION TUBE FRAZIER 10FR DISP (MISCELLANEOUS) IMPLANT
SUT ETHILON 2 0 PSLX (SUTURE) IMPLANT
SYR CONTROL 10ML LL (SYRINGE) IMPLANT
TUBE CONNECTING 12'X1/4 (SUCTIONS)
TUBE CONNECTING 12X1/4 (SUCTIONS) IMPLANT

## 2018-07-23 NOTE — Progress Notes (Signed)
Orthopedic Tech Progress Note Patient Details:  Danielle [name] 1970/11/09 [id number]  Ortho Devices Type of Ortho Device: CAM walker, Crutches Ortho Device/Splint Location: rle Ortho Device/Splint Interventions: Ordered, Application, Adjustment   Post Interventions Patient Tolerated: Well Instructions Provided: Care of device, Adjustment of device   Karolee Stamps 07/23/2018, 11:31 AM

## 2018-07-23 NOTE — Anesthesia Preprocedure Evaluation (Addendum)
Anesthesia Evaluation  Patient identified by MRN, date of birth, ID band Patient awake    Reviewed: Allergy & Precautions, NPO status , Patient's Chart, lab work & pertinent test results  Airway Mallampati: II  TM Distance: >3 FB Neck ROM: full    Dental  (+) Teeth Intact, Dental Advisory Given   Pulmonary asthma ,    breath sounds clear to auscultation       Cardiovascular negative cardio ROS   Rhythm:regular Rate:Normal     Neuro/Psych  Headaches, PSYCHIATRIC DISORDERS Anxiety Depression Bipolar Disorder    GI/Hepatic GERD  ,(+) Hepatitis -  Endo/Other    Renal/GU      Musculoskeletal   Abdominal (+) + obese,   Peds  Hematology  (+) HIV,   Anesthesia Other Findings   Reproductive/Obstetrics                             Anesthesia Physical  Anesthesia Plan  ASA: III  Anesthesia Plan: General   Post-op Pain Management:    Induction: Intravenous  PONV Risk Score and Plan: 3 and Ondansetron, Dexamethasone, Midazolam and Treatment may vary due to age or medical condition  Airway Management Planned: LMA  Additional Equipment:   Intra-op Plan:   Post-operative Plan: Extubation in OR  Informed Consent: I have reviewed the patients History and Physical, chart, labs and discussed the procedure including the risks, benefits and alternatives for the proposed anesthesia with the patient or authorized representative who has indicated his/her understanding and acceptance.   Dental advisory given  Plan Discussed with: CRNA, Anesthesiologist and Surgeon  Anesthesia Plan Comments:        Anesthesia Quick Evaluation

## 2018-07-23 NOTE — Transfer of Care (Signed)
Immediate Anesthesia Transfer of Care Note  Patient: Danielle [name]  Procedure(s) Performed: RIGHT GREAT TOE METATARSOPHALANGEAL JOINT FUSION (Right )  Patient Location: PACU  Anesthesia Type:General  Level of Consciousness: awake, oriented and patient cooperative  Airway & Oxygen Therapy: Patient Spontanous Breathing  Post-op Assessment: Report given to RN  Post vital signs: Reviewed and stable  Last Vitals:  Vitals Value Taken Time  BP    Temp    Pulse    Resp    SpO2      Last Pain:  Vitals:   07/23/18 0648  TempSrc: Oral  PainSc: 7       Patients Stated Pain Goal: 4 (88/89/16 9450)  Complications: No apparent anesthesia complications

## 2018-07-23 NOTE — Op Note (Signed)
07/23/2018  9:18 AM  PATIENT:  Danielle [name]    PRE-OPERATIVE DIAGNOSIS:  Hallux Rigidus Right Great Toe  POST-OPERATIVE DIAGNOSIS:  Same  PROCEDURE:  RIGHT GREAT TOE METATARSOPHALANGEAL JOINT FUSION  SURGEON:  Newt Minion, MD  PHYSICIAN ASSISTANT:None ANESTHESIA:   General  PREOPERATIVE INDICATIONS:  Danielle [name] is a  47 y.o. female with a diagnosis of Hallux Rigidus Right Great Toe who failed conservative measures and elected for surgical management.    The risks benefits and alternatives were discussed with the patient preoperatively including but not limited to the risks of infection, bleeding, nerve injury, cardiopulmonary complications, the need for revision surgery, among others, and the patient was willing to proceed.  OPERATIVE IMPLANTS: Synthes MTP fusion plate 5 degrees dorsiflexion  @ENCIMAGES @  OPERATIVE FINDINGS: Osteophytic bone spurs with loss of articular cartilage  OPERATIVE PROCEDURE: Patient was brought the operating room and underwent a general anesthetic.  After adequate levels anesthesia were obtained patient's right lower extremity was prepped using DuraPrep draped in the sterile field.  A medial longitudinal incision was made this was carried down to the MTP joint.  The retinaculum was incised and the capsule elevated.  Osteophytic bone spurs were removed.  Using the guidewire on the first metatarsal head the cup reamer was used 18 mm to debride back to good bleeding cancellus bone.  Osteophytic bone spurs were removed a guidewire was then used in the proximal phalanx and the cone reamer was used 18 mm to debride back to good bleeding cancellus bone.  Osteophytic bone spurs were removed the joint was reduced the plate was placed dorsally and secured with a compression screw proximally and distally with leaving a 3 mm space in the first webspace.  The remainder of 2 locking screws were placed proximally 2 locking screws distally.  The compression screw  distally was removed and this was replaced with a locking screw.  C-arm fluoroscopy verified alignment in both AP and lateral planes.  The wound was irrigated with normal saline electrocautery was used for hemostasis.  The incision was closed using 2-0 nylon a sterile dressing was applied patient was extubated taken the PACU in stable condition.   DISCHARGE PLANNING:  Antibiotic duration: Preoperative Kefzol  Weightbearing: Nonweightbearing on the right with a fracture boot  Pain medication: Prescription for Vicodin  Dressing care/ Wound VAC: Dressing to be kept clean and dry until follow-up with dressing change in a week  Ambulatory devices: Crutches or walker.  Discharge to: Home.  Follow-up: In the office 1 week post operative.

## 2018-07-23 NOTE — Anesthesia Procedure Notes (Signed)
Procedure Name: LMA Insertion Date/Time: 07/23/2018 8:48 AM Performed by: Barrington Ellison, CRNA Pre-anesthesia Checklist: Patient identified, Emergency Drugs available, Suction available and Patient being monitored Patient Re-evaluated:Patient Re-evaluated prior to induction Oxygen Delivery Method: Circle System Utilized Preoxygenation: Pre-oxygenation with 100% oxygen Induction Type: IV induction Ventilation: Mask ventilation without difficulty LMA: LMA inserted LMA Size: 4.0 Number of attempts: 1 Placement Confirmation: positive ETCO2 Tube secured with: Tape Dental Injury: Teeth and Oropharynx as per pre-operative assessment
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
## 2018-07-23 NOTE — Anesthesia Postprocedure Evaluation (Signed)
Anesthesia Post Note  Patient: Danielle [name]  Procedure(s) Performed: RIGHT GREAT TOE METATARSOPHALANGEAL JOINT FUSION (Right )     Patient location during evaluation: PACU Anesthesia Type: General Level of consciousness: awake and alert Pain management: pain level controlled Vital Signs Assessment: post-procedure vital signs reviewed and stable Respiratory status: spontaneous breathing, nonlabored ventilation and respiratory function stable Cardiovascular status: blood pressure returned to baseline and stable Postop Assessment: no apparent nausea or vomiting Anesthetic complications: no    Last Vitals:  Vitals:   07/23/18 1010 07/23/18 1025  BP: 139/88 (!) 121/93  Pulse: 90   Resp: 12   Temp:    SpO2: 93%     Last Pain:  Vitals:   07/23/18 1029  TempSrc:   PainSc: Asleep        RLE Motor Response: Purposeful movement (07/23/18 1025) RLE Sensation: Full sensation (07/23/18 1025)      Lynda Rainwater

## 2018-07-24 ENCOUNTER — Encounter (HOSPITAL_COMMUNITY): Payer: Self-pay | Admitting: Orthopedic Surgery

## 2018-07-30 ENCOUNTER — Ambulatory Visit (INDEPENDENT_AMBULATORY_CARE_PROVIDER_SITE_OTHER): Payer: Medicare Other | Admitting: Orthopedic Surgery

## 2018-07-30 ENCOUNTER — Ambulatory Visit (INDEPENDENT_AMBULATORY_CARE_PROVIDER_SITE_OTHER): Payer: Medicare Other | Admitting: Family

## 2018-07-31 ENCOUNTER — Ambulatory Visit (INDEPENDENT_AMBULATORY_CARE_PROVIDER_SITE_OTHER): Payer: Medicare Other | Admitting: Physician Assistant

## 2018-07-31 ENCOUNTER — Encounter (INDEPENDENT_AMBULATORY_CARE_PROVIDER_SITE_OTHER): Payer: Self-pay | Admitting: Physician Assistant

## 2018-07-31 VITALS — Ht 61.0 in | Wt 173.8 lb

## 2018-07-31 DIAGNOSIS — M2021 Hallux rigidus, right foot: Secondary | ICD-10-CM

## 2018-08-01 ENCOUNTER — Encounter (INDEPENDENT_AMBULATORY_CARE_PROVIDER_SITE_OTHER): Payer: Self-pay | Admitting: Physician Assistant

## 2018-08-01 NOTE — Progress Notes (Signed)
Office Visit Note   Patient: Danielle [name]           Date of Birth: August 23, 1971           MRN: [id number] Visit Date: 07/31/2018              Requested by: Campbell Riches, MD [address] [place], [place] [address] PCP: Campbell Riches, MD  Chief Complaint  Patient presents with  . Right Foot - Routine Post Op    Right Great toe, MTP fusion      HPI: The patient is a 47 year old female who is seen for postoperative follow-up following right great toe metatarsal phalangeal joint fusion on 07/23/2018.  She reports that she has been trying to keep the foot elevated as much as possible.  She is ambulating with crutches and a fracture boot.  Assessment & Plan: Visit Diagnoses:  1. Hallux rigidus, right foot     Plan: She is going to continue the fracture and elevate as much as possible and minimize weightbearing as much as possible.  She will follow-up in 1 week  Follow-Up Instructions: Return in about 11 days (around 08/11/2018).   Ortho Exam  Patient is alert, oriented, no adenopathy, well-dressed, normal affect, normal respiratory effort. The incision site is healing well and there no signs of infection or cellulitis.  There is no drainage.  Sutures are under some tension.  Imaging: No results found. No images are attached to the encounter.  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 04/27/2015     Lab Results  Component Value Date   ALBUMIN 3.6 07/23/2018   ALBUMIN 4.1 02/19/2018   ALBUMIN 3.9 10/16/2017    Body mass index is 32.84 kg/m.  Orders:  No orders of the defined types were placed in this encounter.  No orders of the defined types were placed in this encounter.    Procedures: No procedures performed  Clinical  Data: No additional findings.  ROS:  All other systems negative, except as noted in the HPI. Review of Systems  Objective: Vital Signs: Ht 5\' 1"  (1.549 m)   Wt 173 lb 12.8 oz (78.8 kg)   LMP  (LMP Unknown)   BMI 32.84 kg/m   Specialty Comments:  No specialty comments available.  PMFS History: Patient Active Problem List   Diagnosis Date Noted  . Hallux rigidus, right foot 03/11/2018  . 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 Impingement syndrome of right ankle   . Sprain of calcaneofibular ligament of right ankle 07/04/2017  . Ankle pain, right 06/24/2017  . Hyperglycemia 02/18/2017  . Hepatitis B immune 02/18/2017  . 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 S/P total abdominal hysterectomy 06/28/2015  . Abnormal uterine bleeding (AUB) 05/03/2015  . Condyloma acuminatum 02/23/2015  . Fibroids 01/05/2015  . Urinary incontinence 01/05/2015  . Abnormal finding on GI tract imaging 10/12/2013  . Anemia-chronic 10/09/2013  . Achalasia s/p laparoscopic Heller myotomy and Dor fundoplication 62/95/2841  . Ganglion cyst of finger of right hand 07/06/2013  . Trichomoniasis of vagina 06/03/2013  . GERD (gastroesophageal reflux disease) 03/16/2013  . Depression 02/10/2009  . Human immunodeficiency virus (HIV) disease ([place]) 03/24/2007  . Anxiety state 03/24/2007  . ASTHMA 03/24/2007   Past Medical History:  Diagnosis Date  . Abnormal uterine bleeding (AUB)   . Achalasia    Type 2  . Anemia   . Anxiety   . Asthma   . Bipolar disorder ([place])    Mixed  . Bulging lumbar disc   . Chronic back pain   . Condyloma acuminata   . Depression   . 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 Ganglion cyst    Right hand  . Genital HSV   . GERD (gastroesophageal reflux disease)   . Hepatitis    States she got a shot for this  . HIV infection ([place])   . Migraines   . Pneumonia   . Pre-diabetes   . Trichimoniasis   . Urinary incontinence   . Uterine fibroid   . Wears glasses       Family History  Problem Relation Age of Onset  . Hyperlipidemia Mother   . Diabetes Mother   . Stroke Mother   . Diabetes Brother   . Colon cancer Father   . Esophageal cancer Neg Hx   . Rectal cancer Neg Hx   . Stomach cancer Neg Hx     Past Surgical History:  Procedure Laterality Date  . ABDOMINAL HYSTERECTOMY N/A 06/28/2015   Procedure: HYSTERECTOMY ABDOMINAL;  Surgeon: Osborne Oman, MD;  Location: [facility];  Service: Gynecology;  Laterality: N/A;  Requested 06/28/15 @ 7:30a  . ANKLE ARTHROSCOPY Right 10/16/2017   Procedure: RIGHT ANKLE ARTHROSCOPIC DEBRIDEMENT;  Surgeon: Newt Minion, MD;  Location: [facility];  Service: Orthopedics;  Laterality: Right;  . ARTHRODESIS METATARSALPHALANGEAL JOINT (MTPJ) Right 07/23/2018   Procedure: RIGHT GREAT TOE METATARSOPHALANGEAL JOINT FUSION;  Surgeon: Newt Minion, MD;  Location: [facility];  Service: Orthopedics;  Laterality: Right;  . BILATERAL SALPINGECTOMY Bilateral 06/28/2015   Procedure: BILATERAL SALPINGECTOMY;  Surgeon: Osborne Oman, MD;  Location: [facility];  Service: Gynecology;  Laterality: Bilateral;  . CESAREAN SECTION     twice;  vertical incision x 2  . COLONOSCOPY N/A 10/12/2013   Procedure: COLONOSCOPY;  Surgeon: Jerene Bears, MD;  [facility];  Service: Endoscopy;  Laterality: N/A;  . CYSTO WITH HYDRODISTENSION N/A 05/27/2018   Procedure: CYSTOSCOPY/HYDRODISTENSION AND INSTILLATION;  Surgeon: Bjorn Loser, MD;  Location: [facility];  Service: Urology;  Laterality: N/A;  . ESOPHAGEAL MANOMETRY N/A 06/29/2013   Procedure: ESOPHAGEAL MANOMETRY (EM);  Surgeon: Jerene Bears, MD;  [facility];  Service: Gastroenterology;  Laterality: N/A;  . FOREIGN BODY REMOVAL Left 2018   fiberglass, left thumb  . HELLER MYOTOMY N/A 10/08/2013   Procedure: LAPAROSCOPIC HELLER MYOTOMY, DOR FUNDIPLICATION, UPPER ENDOSCOPY;  Surgeon: Odis Hollingshead, MD;  [facility];  Service: General;  Laterality:  N/A;  . HERNIA REPAIR     Umbilical  . HYSTEROSCOPY WITH RESECTOSCOPE N/A 05/03/2015   Procedure: Diagnostic Hysteroscopy;  Surgeon: Lavonia Drafts, MD;  Location: [facility];  Service: Gynecology;  Laterality: N/A;  wants Novasure and myosure.  Marland Kitchen LYSIS OF ADHESION  06/28/2015   Procedure: LYSIS OF ADHESION;  Surgeon: Osborne Oman, MD;  Location: [facility];  Service: Gynecology;;  . OTHER SURGICAL HISTORY     biopsy axillary ,right arm  . right arm biopsy    . UPPER GI ENDOSCOPY N/A 10/08/2013   Procedure: UPPER GI ENDOSCOPY;  Surgeon: Odis Hollingshead, MD;  [facility];  Service: General;  Laterality: N/A;  . WISDOM TOOTH EXTRACTION     Social History   Occupational  History  . Occupation: House wife  Tobacco Use  . Smoking status: Never Smoker  . Smokeless tobacco: Never Used  Substance and Sexual Activity  . Alcohol use: No  . Drug use: No  . Sexual activity: Not Currently    Partners: Male    Birth control/protection: Surgical

## 2018-08-07 ENCOUNTER — Other Ambulatory Visit: Payer: Self-pay | Admitting: Infectious Disease

## 2018-08-09 ENCOUNTER — Other Ambulatory Visit: Payer: Self-pay | Admitting: Infectious Disease

## 2018-08-12 ENCOUNTER — Ambulatory Visit (INDEPENDENT_AMBULATORY_CARE_PROVIDER_SITE_OTHER): Payer: Medicare Other | Admitting: Physician Assistant

## 2018-08-12 ENCOUNTER — Encounter (INDEPENDENT_AMBULATORY_CARE_PROVIDER_SITE_OTHER): Payer: Self-pay | Admitting: Physician Assistant

## 2018-08-12 VITALS — Ht 61.0 in | Wt 173.8 lb

## 2018-08-12 DIAGNOSIS — M2021 Hallux rigidus, right foot: Secondary | ICD-10-CM

## 2018-08-12 MED ORDER — GABAPENTIN 100 MG PO CAPS: 300.0000 mg | ORAL_CAPSULE | Freq: Every day | ORAL | 1 refills | Status: DC

## 2018-08-12 MED ORDER — DOXYCYCLINE HYCLATE 100 MG PO CAPS: 100.0000 mg | ORAL_CAPSULE | Freq: Two times a day (BID) | ORAL | 0 refills | Status: DC

## 2018-08-12 NOTE — Progress Notes (Signed)
Office Visit Note   Patient: Danielle [name]           Date of Birth: May 27, 1971           MRN: [id number] Visit Date: 08/12/2018              Requested by: Campbell Riches, MD [address] [place], [place] [address] PCP: Campbell Riches, MD  Chief Complaint  Patient presents with  . Right Foot - Routine Post Op    MTP fusion      HPI: The patient is a 47 year old female who is here with her husband for postoperative follow-up following a right great toe metatarsal phalangeal joint fusion on 07/23/2018.  She reports a lot of discomfort when the water would hit the incisional area when trying to shower and was concerned that she had some pus draining.  She was wearing a fracture boot but does report that she has ambulated some without her boot on and we discussed that she is not ready yet to do this.  She is having a lot of hypersensitivity over the incisional area and can barely stand to touch it.  She does report some pins-and-needles sensations.  Assessment & Plan: Visit Diagnoses:  1. Hallux rigidus, right foot     Plan: Patient was instructed in some desensitization methods including scar massage and wearing a compression stocking.  Also recommended to elevate to try to help control the edema as well.  Were also been to begin a trial of gabapentin 300 mg p.o. nightly and she was cautioned that this may make her sleepy and that she should not drive the following day until she knows how this is going to affect her.  She should continue to elevate the foot is much as possible.  We also started some doxycycline 100 mg p.o. twice daily on 7 days.  She will follow-up in 1 week.  Follow-Up Instructions: Return in about 1 week (around 08/19/2018).   Ortho Exam  Patient is alert, oriented, no adenopathy, well-dressed, normal affect, normal respiratory effort. The right great toe incision is clean dry and intact.  There is no signs of cellulitis or infection.  She is  very hypersensitive with attempts to touch the incisional area and over the great toe.  She does have some edema of the foot and ankle and was encouraged to elevate and wear compression.  Imaging: No results found. No images are attached to the encounter.  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 04/27/2015     Lab Results  Component Value Date   ALBUMIN 3.6 07/23/2018   ALBUMIN 4.1 02/19/2018   ALBUMIN 3.9 10/16/2017    Body mass index is 32.84 kg/m.  Orders:  No orders of the defined types were placed in this encounter.  Meds ordered this encounter  Medications  . gabapentin (NEURONTIN) 100 MG capsule    Sig: Take 3 capsules (300 mg total) by mouth at bedtime.    Dispense:  60 capsule    Refill:  1  . doxycycline (VIBRAMYCIN) 100 MG capsule    Sig: Take 1 capsule (100 mg total) by mouth 2 (two) times daily.    Dispense:  14 capsule    Refill:  0  Procedures: No procedures performed  Clinical Data: No additional findings.  ROS:  All other systems negative, except as noted in the HPI. Review of Systems  Objective: Vital Signs: Ht 5\' 1"  (1.549 m)   Wt 173 lb 12.8 oz (78.8 kg)   LMP  (LMP Unknown)   BMI 32.84 kg/m   Specialty Comments:  No specialty comments available.  PMFS History: Patient Active Problem List   Diagnosis Date Noted  . Hallux rigidus, right foot 03/11/2018  . 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 Impingement syndrome of right ankle   . Sprain of calcaneofibular ligament of right ankle 07/04/2017  . Ankle pain, right 06/24/2017  . Hyperglycemia 02/18/2017  . Hepatitis B immune 02/18/2017  . 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 S/P total abdominal hysterectomy 06/28/2015  . Abnormal uterine bleeding (AUB)  05/03/2015  . Condyloma acuminatum 02/23/2015  . Fibroids 01/05/2015  . Urinary incontinence 01/05/2015  . Abnormal finding on GI tract imaging 10/12/2013  . Anemia-chronic 10/09/2013  . Achalasia s/p laparoscopic Heller myotomy and Dor fundoplication 37/90/2409  . Ganglion cyst of finger of right hand 07/06/2013  . Trichomoniasis of vagina 06/03/2013  . GERD (gastroesophageal reflux disease) 03/16/2013  . Depression 02/10/2009  . Human immunodeficiency virus (HIV) disease ([place]) 03/24/2007  . Anxiety state 03/24/2007  . ASTHMA 03/24/2007   Past Medical History:  Diagnosis Date  . Abnormal uterine bleeding (AUB)   . Achalasia    Type 2  . Anemia   . Anxiety   . Asthma   . Bipolar disorder ([place])    Mixed  . Bulging lumbar disc   . Chronic back pain   . Condyloma acuminata   . Depression   . 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 Ganglion cyst    Right hand  . Genital HSV   . GERD (gastroesophageal reflux disease)   . Hepatitis    States she got a shot for this  . HIV infection ([place])   . Migraines   . Pneumonia   . Pre-diabetes   . Trichimoniasis   . Urinary incontinence   . Uterine fibroid   . Wears glasses     Family History  Problem Relation Age of Onset  . Hyperlipidemia Mother   . Diabetes Mother   . Stroke Mother   . Diabetes Brother   . Colon cancer Father   . Esophageal cancer Neg Hx   . Rectal cancer Neg Hx   . Stomach cancer Neg Hx     Past Surgical History:  Procedure Laterality Date  . ABDOMINAL HYSTERECTOMY N/A 06/28/2015   Procedure: HYSTERECTOMY ABDOMINAL;  Surgeon: Osborne Oman, MD;  Location: [facility];  Service: Gynecology;  Laterality: N/A;  Requested 06/28/15 @ 7:30a  . ANKLE ARTHROSCOPY Right 10/16/2017   Procedure: RIGHT ANKLE ARTHROSCOPIC DEBRIDEMENT;  Surgeon: Newt Minion, MD;  Location: [facility];  Service: Orthopedics;  Laterality: Right;  . ARTHRODESIS METATARSALPHALANGEAL JOINT (MTPJ) Right 07/23/2018   Procedure: RIGHT GREAT TOE  METATARSOPHALANGEAL JOINT FUSION;  Surgeon: Newt Minion, MD;  Location: [facility];  Service: Orthopedics;  Laterality: Right;  . BILATERAL SALPINGECTOMY Bilateral 06/28/2015   Procedure: BILATERAL SALPINGECTOMY;  Surgeon: Osborne Oman, MD;  Location: [facility];  Service: Gynecology;  Laterality: Bilateral;  . CESAREAN SECTION     twice;  vertical incision x 2  . COLONOSCOPY N/A 10/12/2013   Procedure: COLONOSCOPY;  Surgeon: Jerene Bears, MD;  [facility];  Service: Endoscopy;  Laterality: N/A;  .  CYSTO WITH HYDRODISTENSION N/A 05/27/2018   Procedure: CYSTOSCOPY/HYDRODISTENSION AND INSTILLATION;  Surgeon: Bjorn Loser, MD;  Location[facility];  Service: Urology;  Laterality: N/A;  . ESOPHAGEAL MANOMETRY N/A 06/29/2013   Procedure: ESOPHAGEAL MANOMETRY (EM);  Surgeon: Jerene Bears, MD;  [facility];  Service: Gastroenterology;  Laterality: N/A;  . FOREIGN BODY REMOVAL Left 2018   fiberglass, left thumb  . HELLER MYOTOMY N/A 10/08/2013   Procedure: LAPAROSCOPIC HELLER MYOTOMY, DOR FUNDIPLICATION, UPPER ENDOSCOPY;  Surgeon: Odis Hollingshead, MD;  [facility];  Service: General;  Laterality: N/A;  . HERNIA REPAIR     Umbilical  . HYSTEROSCOPY WITH RESECTOSCOPE N/A 05/03/2015   Procedure: Diagnostic Hysteroscopy;  Surgeon: Lavonia Drafts, MD;  Location: [facility];  Service: Gynecology;  Laterality: N/A;  wants Novasure and myosure.  Marland Kitchen LYSIS OF ADHESION  06/28/2015   Procedure: LYSIS OF ADHESION;  Surgeon: Osborne Oman, MD;  Location: [facility];  Service: Gynecology;;  . OTHER SURGICAL HISTORY     biopsy axillary ,right arm  . right arm biopsy    . UPPER GI ENDOSCOPY N/A 10/08/2013   Procedure: UPPER GI ENDOSCOPY;  Surgeon: Odis Hollingshead, MD;  [facility];  Service: General;  Laterality: N/A;  . WISDOM TOOTH EXTRACTION     Social History   Occupational History  . Occupation: House wife  Tobacco Use  . Smoking status: Never Smoker    . Smokeless tobacco: Never Used  Substance and Sexual Activity  . Alcohol use: No  . Drug use: No  . Sexual activity: Not Currently    Partners: Male    Birth control/protection: Surgical

## 2018-08-14 DIAGNOSIS — F410 Panic disorder [episodic paroxysmal anxiety] without agoraphobia: Secondary | ICD-10-CM | POA: Diagnosis not present

## 2018-08-14 DIAGNOSIS — F411 Generalized anxiety disorder: Secondary | ICD-10-CM | POA: Diagnosis not present

## 2018-08-14 DIAGNOSIS — F4321 Adjustment disorder with depressed mood: Secondary | ICD-10-CM | POA: Diagnosis not present

## 2018-08-19 ENCOUNTER — Ambulatory Visit (INDEPENDENT_AMBULATORY_CARE_PROVIDER_SITE_OTHER): Payer: Medicare Other | Admitting: Physician Assistant

## 2018-08-19 ENCOUNTER — Encounter (INDEPENDENT_AMBULATORY_CARE_PROVIDER_SITE_OTHER): Payer: Self-pay | Admitting: Physician Assistant

## 2018-08-19 ENCOUNTER — Ambulatory Visit (INDEPENDENT_AMBULATORY_CARE_PROVIDER_SITE_OTHER): Payer: Medicare Other

## 2018-08-19 VITALS — Ht 61.0 in | Wt 173.8 lb

## 2018-08-19 DIAGNOSIS — M2021 Hallux rigidus, right foot: Secondary | ICD-10-CM

## 2018-08-19 NOTE — Progress Notes (Signed)
Office Visit Note   Patient: Danielle [name]           Date of Birth: 06/20/1971           MRN: [id number] Visit Date: 08/19/2018              Requested by: Campbell Riches, MD [address] [place], [place] [address] PCP: Campbell Riches, MD  Chief Complaint  Patient presents with  . Right Foot - Routine Post Op    Right Great Toe MTP      HPI: The patient is a 47 year old woman who is seen for postoperative follow-up following right great toe MTP fusion on 07/23/2018.  She completed a course of doxycycline and started on gabapentin for some hypersensitivity issues.  She reports that these are improved today.  Her edema is markedly improved.  She has been walking weightbearing as tolerated in her fracture boot.  Assessment & Plan: Visit Diagnoses:  1. Hallux rigidus, right foot     Plan: Instructed the patient she continue continue weightbearing as tolerated in her fracture boot.  We will have her follow-up in 3 weeks with follow-up radiographs at that time.  Follow-Up Instructions: Return in about 3 weeks (around 09/09/2018).   Ortho Exam  Patient is alert, oriented, no adenopathy, well-dressed, normal affect, normal respiratory effort. Right great toe incision is healing well and with minimal scar.  She is less hypersensitive over the scar area this visit.  There is no edema of the foot.  No signs of cellulitis or infection.  She has good dorsalis pedis pulse.  Imaging: No results found. No images are attached to the encounter.  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 04/27/2015     Lab Results  Component Value Date   ALBUMIN 3.6 07/23/2018   ALBUMIN 4.1 02/19/2018   ALBUMIN 3.9 10/16/2017    Body mass index is 32.84  kg/m.  Orders:  Orders Placed This Encounter  Procedures  . XR Foot Complete Right   No orders of the defined types were placed in this encounter.    Procedures: No procedures performed  Clinical Data: No additional findings.  ROS:  All other systems negative, except as noted in the HPI. Review of Systems  Objective: Vital Signs: Ht 5\' 1"  (9.417 m)   Wt 173 lb 12.8 oz (78.8 kg)   LMP  (LMP Unknown)   BMI 32.84 kg/m   Specialty Comments:  No specialty comments available.  PMFS History: Patient Active Problem List   Diagnosis Date Noted  . Hallux rigidus, right foot 03/11/2018  . 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 Impingement syndrome of right ankle   . Sprain of calcaneofibular ligament of right ankle 07/04/2017  . Ankle pain, right 06/24/2017  . Hyperglycemia 02/18/2017  . Hepatitis B immune 02/18/2017  . 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 S/P total abdominal hysterectomy 06/28/2015  . Abnormal uterine bleeding (AUB) 05/03/2015  . Condyloma acuminatum 02/23/2015  . Fibroids 01/05/2015  . Urinary incontinence 01/05/2015  . Abnormal finding on GI tract imaging 10/12/2013  . Anemia-chronic 10/09/2013  . Achalasia s/p laparoscopic Heller myotomy and Dor fundoplication 40/81/4481  . Ganglion cyst of finger of right hand 07/06/2013  . Trichomoniasis of vagina 06/03/2013  .  GERD (gastroesophageal reflux disease) 03/16/2013  . Depression 02/10/2009  . Human immunodeficiency virus (HIV) disease ([place]) 03/24/2007  . Anxiety state 03/24/2007  . ASTHMA 03/24/2007   Past Medical History:  Diagnosis Date  . Abnormal uterine bleeding (AUB)   . Achalasia    Type 2  . Anemia   . Anxiety   . Asthma   . Bipolar disorder ([place])    Mixed  . Bulging lumbar disc   . Chronic back pain   . Condyloma acuminata   . Depression   . 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 Ganglion cyst    Right hand  . Genital HSV   . GERD (gastroesophageal reflux disease)    . Hepatitis    States she got a shot for this  . HIV infection ([place])   . Migraines   . Pneumonia   . Pre-diabetes   . Trichimoniasis   . Urinary incontinence   . Uterine fibroid   . Wears glasses     Family History  Problem Relation Age of Onset  . Hyperlipidemia Mother   . Diabetes Mother   . Stroke Mother   . Diabetes Brother   . Colon cancer Father   . Esophageal cancer Neg Hx   . Rectal cancer Neg Hx   . Stomach cancer Neg Hx     Past Surgical History:  Procedure Laterality Date  . ABDOMINAL HYSTERECTOMY N/A 06/28/2015   Procedure: HYSTERECTOMY ABDOMINAL;  Surgeon: Osborne Oman, MD;  Location: [facility];  Service: Gynecology;  Laterality: N/A;  Requested 06/28/15 @ 7:30a  . ANKLE ARTHROSCOPY Right 10/16/2017   Procedure: RIGHT ANKLE ARTHROSCOPIC DEBRIDEMENT;  Surgeon: Newt Minion, MD;  Location: [facility];  Service: Orthopedics;  Laterality: Right;  . ARTHRODESIS METATARSALPHALANGEAL JOINT (MTPJ) Right 07/23/2018   Procedure: RIGHT GREAT TOE METATARSOPHALANGEAL JOINT FUSION;  Surgeon: Newt Minion, MD;  Location: [facility];  Service: Orthopedics;  Laterality: Right;  . BILATERAL SALPINGECTOMY Bilateral 06/28/2015   Procedure: BILATERAL SALPINGECTOMY;  Surgeon: Osborne Oman, MD;  Location: [facility];  Service: Gynecology;  Laterality: Bilateral;  . CESAREAN SECTION     twice;  vertical incision x 2  . COLONOSCOPY N/A 10/12/2013   Procedure: COLONOSCOPY;  Surgeon: Jerene Bears, MD;  [facility];  Service: Endoscopy;  Laterality: N/A;  . CYSTO WITH HYDRODISTENSION N/A 05/27/2018   Procedure: CYSTOSCOPY/HYDRODISTENSION AND INSTILLATION;  Surgeon: Bjorn Loser, MD;  Location: [facility];  Service: Urology;  Laterality: N/A;  . ESOPHAGEAL MANOMETRY N/A 06/29/2013   Procedure: ESOPHAGEAL MANOMETRY (EM);  Surgeon: Jerene Bears, MD;  [facility];  Service: Gastroenterology;  Laterality: N/A;  . FOREIGN BODY REMOVAL Left 2018   fiberglass,  left thumb  . HELLER MYOTOMY N/A 10/08/2013   Procedure: LAPAROSCOPIC HELLER MYOTOMY, DOR FUNDIPLICATION, UPPER ENDOSCOPY;  Surgeon: Odis Hollingshead, MD;  [facility];  Service: General;  Laterality: N/A;  . HERNIA REPAIR     Umbilical  . HYSTEROSCOPY WITH RESECTOSCOPE N/A 05/03/2015   Procedure: Diagnostic Hysteroscopy;  Surgeon: Lavonia Drafts, MD;  Location: [facility];  Service: Gynecology;  Laterality: N/A;  wants Novasure and myosure.  Marland Kitchen LYSIS OF ADHESION  06/28/2015   Procedure: LYSIS OF ADHESION;  Surgeon: Osborne Oman, MD;  Location: [facility];  Service: Gynecology;;  . OTHER SURGICAL HISTORY     biopsy axillary ,right arm  . right arm biopsy    . UPPER GI ENDOSCOPY N/A 10/08/2013   Procedure: UPPER  GI ENDOSCOPY;  Surgeon: Odis Hollingshead, MD;  [facility];  Service: General;  Laterality: N/A;  . WISDOM TOOTH EXTRACTION     Social History   Occupational History  . Occupation: House wife  Tobacco Use  . Smoking status: Never Smoker  . Smokeless tobacco: Never Used  Substance and Sexual Activity  . Alcohol use: No  . Drug use: No  . Sexual activity: Not Currently    Partners: Male    Birth control/protection: Surgical

## 2018-08-28 DIAGNOSIS — F4321 Adjustment disorder with depressed mood: Secondary | ICD-10-CM | POA: Diagnosis not present

## 2018-08-28 DIAGNOSIS — F411 Generalized anxiety disorder: Secondary | ICD-10-CM | POA: Diagnosis not present

## 2018-08-28 DIAGNOSIS — F410 Panic disorder [episodic paroxysmal anxiety] without agoraphobia: Secondary | ICD-10-CM | POA: Diagnosis not present

## 2018-09-09 ENCOUNTER — Ambulatory Visit (INDEPENDENT_AMBULATORY_CARE_PROVIDER_SITE_OTHER): Payer: Medicare Other | Admitting: Physician Assistant

## 2018-09-09 ENCOUNTER — Ambulatory Visit (INDEPENDENT_AMBULATORY_CARE_PROVIDER_SITE_OTHER): Payer: Medicare Other

## 2018-09-09 ENCOUNTER — Encounter (INDEPENDENT_AMBULATORY_CARE_PROVIDER_SITE_OTHER): Payer: Self-pay | Admitting: Physician Assistant

## 2018-09-09 VITALS — Ht 61.0 in | Wt 173.8 lb

## 2018-09-09 DIAGNOSIS — M25571 Pain in right ankle and joints of right foot: Secondary | ICD-10-CM

## 2018-09-09 DIAGNOSIS — M2021 Hallux rigidus, right foot: Secondary | ICD-10-CM

## 2018-09-09 MED ORDER — HYDROCODONE-ACETAMINOPHEN 5-325 MG PO TABS: 1.0000 | ORAL_TABLET | Freq: Four times a day (QID) | ORAL | 0 refills | Status: DC | PRN

## 2018-09-09 NOTE — Progress Notes (Signed)
Office Visit Note   Patient: Danielle [name]           Date of Birth: October 07, 1970           MRN: [id number] Visit Date: 09/09/2018              Requested by: Campbell Riches, MD [address] [place], [place] [address] PCP: Campbell Riches, MD  Chief Complaint  Patient presents with  . Right Foot - Routine Post Op      HPI: The patient is a 47 year old woman here with her husband for postoperative follow-up following right great toe MTP fusion on 07/23/2018.  She continues on some gabapentin for hypersensitivity issues.  She reports that she has been intermittently walking without anything foot wear wise on and also jumped out of bed a couple times directly onto the toe and reported significant pain with this.  Otherwise she is trying to wear either a postop shoe or a fracture boot.  She continues to have a lot of dry skin and swelling over the area which does worsen when she is up for longer periods.  Assessment & Plan: Visit Diagnoses:  1. Hallux rigidus, right foot   2. Pain in right ankle and joints of right foot     Plan: Provided the patient with a Vive compression stocking to help with edema control and also with her hypersensitivity issues.  She will continue to utilize fracture boot or postop shoe for ambulation to tolerance.  She was instructed to wear the compression stocking around-the-clock except for showering.  She will follow-up here in 2 weeks with Dr. Sharol Given or sooner should she have difficulties in the interim.  Follow-Up Instructions: Return in about 2 weeks (around 09/23/2018) for with Dr. Sharol Given.   Ortho Exam  Patient is alert, oriented, no adenopathy, well-dressed, normal affect, normal respiratory effort. The right great toe incision has some thick crusting and there is xerosis throughout the foot.  Patient was instructed to utilize either Shea butter cocoa butter for moisturization.  She is neurovascularly intact distally but continues to have  hypersensitivity with touch over the incisional area as well as over the foot throughout.  There is mild edema.  There is no erythema no increased warmth or other signs of cellulitis or infection.  Imaging: Xr Foot Complete Right  Result Date: 09/09/2018 Radiographs of the right foot show fixation in place with screws in place without evidence of lucency or backing out.  There is some callus present.  No images are attached to the encounter.  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 04/27/2015     Lab Results  Component Value Date   ALBUMIN 3.6 07/23/2018   ALBUMIN 4.1 02/19/2018   ALBUMIN 3.9 10/16/2017    Body mass index is 32.84 kg/m.  Orders:  Orders Placed This Encounter  Procedures  . XR Foot Complete Right   Meds ordered this encounter  Medications  . HYDROcodone-acetaminophen (NORCO/VICODIN) 5-325 MG tablet    Sig: Take 1 tablet by mouth every 6 (six) hours as needed for moderate pain or severe pain.    Dispense:  28 tablet    Refill:  0     Procedures: No procedures performed  Clinical Data: No additional findings.  ROS:  All other systems negative, except as noted in the HPI. Review of Systems  Objective: Vital Signs: Ht 5\' 1"  (1.549 m)   Wt 173 lb 12.8 oz (78.8 kg)   LMP  (LMP Unknown)   BMI 32.84 kg/m   Specialty Comments:  No specialty comments available.  PMFS History: Patient Active Problem List   Diagnosis Date Noted  . Hallux rigidus, right foot 03/11/2018  . 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 Impingement syndrome of right ankle   . Sprain of calcaneofibular ligament of right ankle 07/04/2017  . Ankle pain, right 06/24/2017  . Hyperglycemia 02/18/2017  . Hepatitis B immune 02/18/2017  . Fibroadenoma of  breast 02/18/2017  . S/P total abdominal hysterectomy 06/28/2015  . Abnormal uterine bleeding (AUB) 05/03/2015  . Condyloma acuminatum 02/23/2015  . Fibroids 01/05/2015  . Urinary incontinence 01/05/2015  . Abnormal finding on GI tract imaging 10/12/2013  . Anemia-chronic 10/09/2013  . Achalasia s/p laparoscopic Heller myotomy and Dor fundoplication 97/10/6376  . Ganglion cyst of finger of right hand 07/06/2013  . Trichomoniasis of vagina 06/03/2013  . GERD (gastroesophageal reflux disease) 03/16/2013  . Depression 02/10/2009  . Human immunodeficiency virus (HIV) disease ([place]) 03/24/2007  . Anxiety state 03/24/2007  . ASTHMA 03/24/2007   Past Medical History:  Diagnosis Date  . Abnormal uterine bleeding (AUB)   . Achalasia    Type 2  . Anemia   . Anxiety   . Asthma   . Bipolar disorder ([place])    Mixed  . Bulging lumbar disc   . Chronic back pain   . Condyloma acuminata   . Depression   . 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 Ganglion cyst    Right hand  . Genital HSV   . GERD (gastroesophageal reflux disease)   . Hepatitis    States she got a shot for this  . HIV infection ([place])   . Migraines   . Pneumonia   . Pre-diabetes   . Trichimoniasis   . Urinary incontinence   . Uterine fibroid   . Wears glasses     Family History  Problem Relation Age of Onset  . Hyperlipidemia Mother   . Diabetes Mother   . Stroke Mother   . Diabetes Brother   . Colon cancer Father   . Esophageal cancer Neg Hx   . Rectal cancer Neg Hx   . Stomach cancer Neg Hx     Past Surgical History:  Procedure Laterality Date  . ABDOMINAL HYSTERECTOMY N/A 06/28/2015   Procedure: HYSTERECTOMY ABDOMINAL;  Surgeon: Osborne Oman, MD;  Location: [facility];  Service: Gynecology;  Laterality: N/A;  Requested 06/28/15 @ 7:30a  . ANKLE ARTHROSCOPY Right 10/16/2017   Procedure: RIGHT ANKLE ARTHROSCOPIC DEBRIDEMENT;  Surgeon: Newt Minion, MD;  Location: [facility];  Service: Orthopedics;  Laterality:  Right;  . ARTHRODESIS METATARSALPHALANGEAL JOINT (MTPJ) Right 07/23/2018   Procedure: RIGHT GREAT TOE METATARSOPHALANGEAL JOINT FUSION;  Surgeon: Newt Minion, MD;  Location: [facility];  Service: Orthopedics;  Laterality: Right;  . BILATERAL SALPINGECTOMY Bilateral 06/28/2015   Procedure: BILATERAL SALPINGECTOMY;  Surgeon: Osborne Oman, MD;  Location: [facility];  Service: Gynecology;  Laterality: Bilateral;  . CESAREAN SECTION     twice;  vertical incision x 2  . COLONOSCOPY N/A 10/12/2013   Procedure: COLONOSCOPY;  Surgeon: Jerene Bears, MD;  [facility];  Service: Endoscopy;  Laterality: N/A;  . CYSTO WITH HYDRODISTENSION N/A 05/27/2018   Procedure: CYSTOSCOPY/HYDRODISTENSION  AND INSTILLATION;  Surgeon: Bjorn Loser, MD;  Location[facility];  Service: Urology;  Laterality: N/A;  . ESOPHAGEAL MANOMETRY N/A 06/29/2013   Procedure: ESOPHAGEAL MANOMETRY (EM);  Surgeon: Jerene Bears, MD;  [facility];  Service: Gastroenterology;  Laterality: N/A;  . FOREIGN BODY REMOVAL Left 2018   fiberglass, left thumb  . HELLER MYOTOMY N/A 10/08/2013   Procedure: LAPAROSCOPIC HELLER MYOTOMY, DOR FUNDIPLICATION, UPPER ENDOSCOPY;  Surgeon: Odis Hollingshead, MD;  [facility];  Service: General;  Laterality: N/A;  . HERNIA REPAIR     Umbilical  . HYSTEROSCOPY WITH RESECTOSCOPE N/A 05/03/2015   Procedure: Diagnostic Hysteroscopy;  Surgeon: Lavonia Drafts, MD;  Location: [facility];  Service: Gynecology;  Laterality: N/A;  wants Novasure and myosure.  Marland Kitchen LYSIS OF ADHESION  06/28/2015   Procedure: LYSIS OF ADHESION;  Surgeon: Osborne Oman, MD;  Location: [facility];  Service: Gynecology;;  . OTHER SURGICAL HISTORY     biopsy axillary ,right arm  . right arm biopsy    . UPPER GI ENDOSCOPY N/A 10/08/2013   Procedure: UPPER GI ENDOSCOPY;  Surgeon: Odis Hollingshead, MD;  [facility];  Service: General;  Laterality: N/A;  . WISDOM TOOTH EXTRACTION     Social  History   Occupational History  . Occupation: House wife  Tobacco Use  . Smoking status: Never Smoker  . Smokeless tobacco: Never Used  Substance and Sexual Activity  . Alcohol use: No  . Drug use: No  . Sexual activity: Not Currently    Partners: Male    Birth control/protection: Surgical

## 2018-09-12 DIAGNOSIS — F410 Panic disorder [episodic paroxysmal anxiety] without agoraphobia: Secondary | ICD-10-CM | POA: Diagnosis not present

## 2018-09-12 DIAGNOSIS — F411 Generalized anxiety disorder: Secondary | ICD-10-CM | POA: Diagnosis not present

## 2018-09-12 DIAGNOSIS — F4321 Adjustment disorder with depressed mood: Secondary | ICD-10-CM | POA: Diagnosis not present

## 2018-09-17 DIAGNOSIS — Z131 Encounter for screening for diabetes mellitus: Secondary | ICD-10-CM | POA: Diagnosis not present

## 2018-09-17 DIAGNOSIS — R7303 Prediabetes: Secondary | ICD-10-CM | POA: Diagnosis not present

## 2018-09-17 DIAGNOSIS — B9735 Human immunodeficiency virus, type 2 [HIV 2] as the cause of diseases classified elsewhere: Secondary | ICD-10-CM | POA: Diagnosis not present

## 2018-09-17 DIAGNOSIS — Z13220 Encounter for screening for lipoid disorders: Secondary | ICD-10-CM | POA: Diagnosis not present

## 2018-09-17 DIAGNOSIS — Z13228 Encounter for screening for other metabolic disorders: Secondary | ICD-10-CM | POA: Diagnosis not present

## 2018-09-25 ENCOUNTER — Ambulatory Visit (INDEPENDENT_AMBULATORY_CARE_PROVIDER_SITE_OTHER): Payer: Medicare HMO | Admitting: Orthopedic Surgery

## 2018-09-25 ENCOUNTER — Encounter (INDEPENDENT_AMBULATORY_CARE_PROVIDER_SITE_OTHER): Payer: Self-pay | Admitting: Orthopedic Surgery

## 2018-09-25 VITALS — Ht 61.0 in | Wt 173.0 lb

## 2018-09-25 DIAGNOSIS — M2021 Hallux rigidus, right foot: Secondary | ICD-10-CM

## 2018-09-25 NOTE — Progress Notes (Signed)
Office Visit Note   Patient: Danielle [name]           Date of Birth: [birth year]           MRN: [id number] Visit Date: 09/25/2018              Requested by: Campbell Riches, MD [address], [place] [address] PCP: Campbell Riches, MD  Chief Complaint  Patient presents with  . Right Foot - Routine Post Op    07/23/18 right GT MTP fusion       HPI: Patient is a 48 year old woman status post right great toe MTP fusion.  Patient is currently wearing compression stockings she states she has minimal pain is pleased with her progress.  Assessment & Plan: Visit Diagnoses:  1. Hallux rigidus, right foot     Plan: Recommended continuing the compression stockings for her venous insufficiency.  Patient was given instructions for Achilles stretching recommended a stiff soled supportive sneaker with orthotics.  Follow-Up Instructions: Return if symptoms worsen or fail to improve.   Ortho Exam  Patient is alert, oriented, no adenopathy, well-dressed, normal affect, normal respiratory effort. Examination patient does have some venous stasis changes with brawny coloration in the ankle and foot.  The incision is well-healed the fusion site is nontender to palpation there is minimal swelling no signs of infection.  Imaging: No results found. No images are attached to the encounter.  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 04/27/2015     Lab Results  Component Value Date   ALBUMIN 3.6 07/23/2018   ALBUMIN 4.1 02/19/2018   ALBUMIN 3.9 10/16/2017    Body mass index is 32.69 kg/m.  Orders:  No orders of the defined types were placed in this encounter.  No orders of the defined types were placed in this encounter.    Procedures: No  procedures performed  Clinical Data: No additional findings.  ROS:  All other systems negative, except as noted in the HPI. Review of Systems  Objective: Vital Signs: Ht 5\' 1"  (1.549 m)   Wt 173 lb (78.5 kg)   LMP  (LMP Unknown)   BMI 32.69 kg/m   Specialty Comments:  No specialty comments available.  PMFS History: Patient Active Problem List   Diagnosis Date Noted  . Hallux rigidus, right foot 03/11/2018  . 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 Impingement syndrome of right ankle   . Sprain of calcaneofibular ligament of right ankle 07/04/2017  . Ankle pain, right 06/24/2017  . Hyperglycemia 02/18/2017  . Hepatitis B immune 02/18/2017  . 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 S/P total abdominal hysterectomy 06/28/2015  . Abnormal uterine bleeding (AUB) 05/03/2015  . Condyloma acuminatum 02/23/2015  . Fibroids 01/05/2015  . Urinary incontinence 01/05/2015  . Abnormal finding on GI tract imaging 10/12/2013  . Anemia-chronic 10/09/2013  . Achalasia s/p laparoscopic Heller myotomy and Dor fundoplication 32/99/2426  . Ganglion cyst of finger of right hand 07/06/2013  . Trichomoniasis of vagina 06/03/2013  . GERD (gastroesophageal reflux disease) 03/16/2013  . Depression 02/10/2009  . Human immunodeficiency virus (HIV) disease ([place]) 03/24/2007  . Anxiety state 03/24/2007  . ASTHMA 03/24/2007   Past Medical History:  Diagnosis Date  . Abnormal uterine bleeding (AUB)   .  Achalasia    Type 2  . Anemia   . Anxiety   . Asthma   . Bipolar disorder ([place])    Mixed  . Bulging lumbar disc   . Chronic back pain   . Condyloma acuminata   . Depression   . 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 Ganglion cyst    Right hand  . Genital HSV   . GERD (gastroesophageal reflux disease)   . Hepatitis    States she got a shot for this  . HIV infection ([place])   . Migraines   . Pneumonia   . Pre-diabetes   . Trichimoniasis   . Urinary incontinence   . Uterine  fibroid   . Wears glasses     Family History  Problem Relation Age of Onset  . Hyperlipidemia Mother   . Diabetes Mother   . Stroke Mother   . Diabetes Brother   . Colon cancer Father   . Esophageal cancer Neg Hx   . Rectal cancer Neg Hx   . Stomach cancer Neg Hx     Past Surgical History:  Procedure Laterality Date  . ABDOMINAL HYSTERECTOMY N/A 06/28/2015   Procedure: HYSTERECTOMY ABDOMINAL;  Surgeon: Osborne Oman, MD;  Location: [facility];  Service: Gynecology;  Laterality: N/A;  Requested 06/28/15 @ 7:30a  . ANKLE ARTHROSCOPY Right 10/16/2017   Procedure: RIGHT ANKLE ARTHROSCOPIC DEBRIDEMENT;  Surgeon: Newt Minion, MD;  Location: [facility];  Service: Orthopedics;  Laterality: Right;  . ARTHRODESIS METATARSALPHALANGEAL JOINT (MTPJ) Right 07/23/2018   Procedure: RIGHT GREAT TOE METATARSOPHALANGEAL JOINT FUSION;  Surgeon: Newt Minion, MD;  Location: [facility];  Service: Orthopedics;  Laterality: Right;  . BILATERAL SALPINGECTOMY Bilateral 06/28/2015   Procedure: BILATERAL SALPINGECTOMY;  Surgeon: Osborne Oman, MD;  Location: [facility];  Service: Gynecology;  Laterality: Bilateral;  . CESAREAN SECTION     twice;  vertical incision x 2  . COLONOSCOPY N/A 10/12/2013   Procedure: COLONOSCOPY;  Surgeon: Jerene Bears, MD;  [facility];  Service: Endoscopy;  Laterality: N/A;  . CYSTO WITH HYDRODISTENSION N/A 05/27/2018   Procedure: CYSTOSCOPY/HYDRODISTENSION AND INSTILLATION;  Surgeon: Bjorn Loser, MD;  [facility];  Service: Urology;  Laterality: N/A;  . ESOPHAGEAL MANOMETRY N/A 06/29/2013   Procedure: ESOPHAGEAL MANOMETRY (EM);  Surgeon: Jerene Bears, MD;  [facility];  Service: Gastroenterology;  Laterality: N/A;  . FOREIGN BODY REMOVAL Left 2018   fiberglass, left thumb  . HELLER MYOTOMY N/A 10/08/2013   Procedure: LAPAROSCOPIC HELLER MYOTOMY, DOR FUNDIPLICATION, UPPER ENDOSCOPY;  Surgeon: Odis Hollingshead, MD;  [facility];   Service: General;  Laterality: N/A;  . HERNIA REPAIR     Umbilical  . HYSTEROSCOPY WITH RESECTOSCOPE N/A 05/03/2015   Procedure: Diagnostic Hysteroscopy;  Surgeon: Lavonia Drafts, MD;  Location: [facility];  Service: Gynecology;  Laterality: N/A;  wants Novasure and myosure.  Marland Kitchen LYSIS OF ADHESION  06/28/2015   Procedure: LYSIS OF ADHESION;  Surgeon: Osborne Oman, MD;  Location: [facility];  Service: Gynecology;;  . OTHER SURGICAL HISTORY     biopsy axillary ,right arm  . right arm biopsy    . UPPER GI ENDOSCOPY N/A 10/08/2013   Procedure: UPPER GI ENDOSCOPY;  Surgeon: Odis Hollingshead, MD;  [facility];  Service: General;  Laterality: N/A;  . WISDOM TOOTH EXTRACTION     Social History   Occupational History  . Occupation: House wife  Tobacco Use  . Smoking  status: Never Smoker  . Smokeless tobacco: Never Used  Substance and Sexual Activity  . Alcohol use: No  . Drug use: No  . Sexual activity: Not Currently    Partners: Male    Birth control/protection: Surgical

## 2018-09-26 DIAGNOSIS — F410 Panic disorder [episodic paroxysmal anxiety] without agoraphobia: Secondary | ICD-10-CM | POA: Diagnosis not present

## 2018-09-26 DIAGNOSIS — F4321 Adjustment disorder with depressed mood: Secondary | ICD-10-CM | POA: Diagnosis not present

## 2018-09-26 DIAGNOSIS — F411 Generalized anxiety disorder: Secondary | ICD-10-CM | POA: Diagnosis not present

## 2018-09-29 ENCOUNTER — Ambulatory Visit (HOSPITAL_COMMUNITY)
Admission: EM | Admit: 2018-09-29 | Discharge: 2018-09-29 | Disposition: A | Discharge status: Home / Self Care | Payer: Medicaid Other | Attending: Internal Medicine | Admitting: Internal Medicine

## 2018-09-29 ENCOUNTER — Encounter (HOSPITAL_COMMUNITY): Payer: Self-pay | Admitting: Emergency Medicine

## 2018-09-29 ENCOUNTER — Other Ambulatory Visit: Payer: Self-pay

## 2018-09-29 DIAGNOSIS — J020 Streptococcal pharyngitis: Secondary | ICD-10-CM | POA: Insufficient documentation

## 2018-09-29 MED ORDER — POLYMYXIN B-TRIMETHOPRIM 10000-0.1 UNIT/ML-% OP SOLN: 1.0000 [drp] | Freq: Four times a day (QID) | OPHTHALMIC | 0 refills | Status: AC

## 2018-09-29 MED ORDER — PENICILLIN V POTASSIUM 500 MG PO TABS: 500.0000 mg | ORAL_TABLET | Freq: Two times a day (BID) | ORAL | 0 refills | Status: AC

## 2018-09-29 NOTE — ED Triage Notes (Signed)
Started loosing her voice yesterday.  Last week started with runny nose, coughing, sneezing and both eyes red.

## 2018-10-08 DIAGNOSIS — B9735 Human immunodeficiency virus, type 2 [HIV 2] as the cause of diseases classified elsewhere: Secondary | ICD-10-CM | POA: Diagnosis not present

## 2018-10-08 DIAGNOSIS — R7303 Prediabetes: Secondary | ICD-10-CM | POA: Diagnosis not present

## 2018-10-08 DIAGNOSIS — Z0000 Encounter for general adult medical examination without abnormal findings: Secondary | ICD-10-CM | POA: Diagnosis not present

## 2018-10-08 DIAGNOSIS — J209 Acute bronchitis, unspecified: Secondary | ICD-10-CM | POA: Diagnosis not present

## 2018-10-08 DIAGNOSIS — E782 Mixed hyperlipidemia: Secondary | ICD-10-CM | POA: Diagnosis not present

## 2018-10-10 DIAGNOSIS — F410 Panic disorder [episodic paroxysmal anxiety] without agoraphobia: Secondary | ICD-10-CM | POA: Diagnosis not present

## 2018-10-10 DIAGNOSIS — F4321 Adjustment disorder with depressed mood: Secondary | ICD-10-CM | POA: Diagnosis not present

## 2018-10-10 DIAGNOSIS — F411 Generalized anxiety disorder: Secondary | ICD-10-CM | POA: Diagnosis not present

## 2018-10-24 DIAGNOSIS — F410 Panic disorder [episodic paroxysmal anxiety] without agoraphobia: Secondary | ICD-10-CM | POA: Diagnosis not present

## 2018-10-24 DIAGNOSIS — F4321 Adjustment disorder with depressed mood: Secondary | ICD-10-CM | POA: Diagnosis not present

## 2018-10-24 DIAGNOSIS — F411 Generalized anxiety disorder: Secondary | ICD-10-CM | POA: Diagnosis not present

## 2018-11-05 DIAGNOSIS — F411 Generalized anxiety disorder: Secondary | ICD-10-CM | POA: Diagnosis not present

## 2018-11-05 DIAGNOSIS — F410 Panic disorder [episodic paroxysmal anxiety] without agoraphobia: Secondary | ICD-10-CM | POA: Diagnosis not present

## 2018-11-05 DIAGNOSIS — F4321 Adjustment disorder with depressed mood: Secondary | ICD-10-CM | POA: Diagnosis not present

## 2018-11-19 DIAGNOSIS — F411 Generalized anxiety disorder: Secondary | ICD-10-CM | POA: Diagnosis not present

## 2018-11-19 DIAGNOSIS — F410 Panic disorder [episodic paroxysmal anxiety] without agoraphobia: Secondary | ICD-10-CM | POA: Diagnosis not present

## 2018-11-19 DIAGNOSIS — F4321 Adjustment disorder with depressed mood: Secondary | ICD-10-CM | POA: Diagnosis not present

## 2018-11-26 ENCOUNTER — Telehealth (INDEPENDENT_AMBULATORY_CARE_PROVIDER_SITE_OTHER): Payer: Self-pay | Admitting: Orthopedic Surgery

## 2018-11-26 NOTE — Telephone Encounter (Signed)
Pt called in said she just had surgery with Dr.Duda on her right foot and she was given compression socks but she said they happen to be very tight on her and causing pain which is causing her foot to swell.  Please give her a call [phone]

## 2018-11-27 NOTE — Telephone Encounter (Signed)
call

## 2018-11-27 NOTE — Telephone Encounter (Signed)
Can you call pt and make an appt. This is different from her last visit and would need eval to assess. thanks

## 2018-12-03 DIAGNOSIS — F410 Panic disorder [episodic paroxysmal anxiety] without agoraphobia: Secondary | ICD-10-CM | POA: Diagnosis not present

## 2018-12-03 DIAGNOSIS — F4321 Adjustment disorder with depressed mood: Secondary | ICD-10-CM | POA: Diagnosis not present

## 2018-12-03 DIAGNOSIS — F411 Generalized anxiety disorder: Secondary | ICD-10-CM | POA: Diagnosis not present

## 2018-12-10 ENCOUNTER — Other Ambulatory Visit: Payer: Self-pay | Admitting: Family Medicine

## 2018-12-10 ENCOUNTER — Ambulatory Visit
Admission: RE | Admit: 2018-12-10 | Discharge: 2018-12-10 | Disposition: A | Discharge status: Home / Self Care | Payer: Medicaid Other | Source: Ambulatory Visit | Attending: Family Medicine | Admitting: Family Medicine

## 2018-12-10 DIAGNOSIS — R509 Fever, unspecified: Secondary | ICD-10-CM | POA: Diagnosis not present

## 2018-12-10 DIAGNOSIS — E782 Mixed hyperlipidemia: Secondary | ICD-10-CM | POA: Diagnosis not present

## 2018-12-10 DIAGNOSIS — Z21 Asymptomatic human immunodeficiency virus [HIV] infection status: Secondary | ICD-10-CM

## 2018-12-10 DIAGNOSIS — D649 Anemia, unspecified: Secondary | ICD-10-CM | POA: Diagnosis not present

## 2018-12-10 DIAGNOSIS — B20 Human immunodeficiency virus [HIV] disease: Secondary | ICD-10-CM

## 2018-12-10 DIAGNOSIS — R42 Dizziness and giddiness: Secondary | ICD-10-CM

## 2018-12-10 DIAGNOSIS — R5383 Other fatigue: Secondary | ICD-10-CM | POA: Diagnosis not present

## 2018-12-10 DIAGNOSIS — R7303 Prediabetes: Secondary | ICD-10-CM | POA: Diagnosis not present

## 2018-12-10 DIAGNOSIS — B9735 Human immunodeficiency virus, type 2 [HIV 2] as the cause of diseases classified elsewhere: Secondary | ICD-10-CM | POA: Diagnosis not present

## 2018-12-11 ENCOUNTER — Telehealth (INDEPENDENT_AMBULATORY_CARE_PROVIDER_SITE_OTHER): Payer: Self-pay | Admitting: Radiology

## 2018-12-11 NOTE — Telephone Encounter (Signed)
Called and spoke with patient, patient answered NO to all pre screening questions for appointment on 4/6 

## 2018-12-15 ENCOUNTER — Other Ambulatory Visit: Payer: Self-pay | Admitting: Infectious Diseases

## 2018-12-15 ENCOUNTER — Other Ambulatory Visit: Payer: Self-pay

## 2018-12-15 ENCOUNTER — Ambulatory Visit (INDEPENDENT_AMBULATORY_CARE_PROVIDER_SITE_OTHER): Payer: Medicare HMO

## 2018-12-15 ENCOUNTER — Encounter (INDEPENDENT_AMBULATORY_CARE_PROVIDER_SITE_OTHER): Payer: Self-pay | Admitting: Orthopedic Surgery

## 2018-12-15 ENCOUNTER — Ambulatory Visit (INDEPENDENT_AMBULATORY_CARE_PROVIDER_SITE_OTHER): Payer: Medicare HMO | Admitting: Physician Assistant

## 2018-12-15 VITALS — Ht 61.0 in | Wt 173.0 lb

## 2018-12-15 DIAGNOSIS — M2021 Hallux rigidus, right foot: Secondary | ICD-10-CM | POA: Diagnosis not present

## 2018-12-15 DIAGNOSIS — K219 Gastro-esophageal reflux disease without esophagitis: Secondary | ICD-10-CM

## 2018-12-15 NOTE — Progress Notes (Signed)
Office Visit Note   Patient: Danielle [name]           Date of Birth: 02/01/1971           MRN: [id number] Visit Date: 12/15/2018              Requested by: Campbell Riches, MD [address], [place] [address] PCP: Campbell Riches, MD  Chief Complaint  Patient presents with   Right Foot - Follow-up      HPI: The patient is a 48 year old woman who is seen for postoperative follow-up following fusion of her right great toe.  She complains of residual pain mainly over the incisional area as well as over the ball of her foot.  She did not like wearing her compression sock as she felt it was squeezing her foot too much.  She reports she has been unable to progress to wearing a regular shoe.  She reports that she feels like the compression stocking was making her foot swell worse.  Assessment & Plan: Visit Diagnoses:  1. Hallux rigidus, right foot     Plan: Counseled the patient to work aggressively on Achilles tendon stretching 5 times daily for a minute each time and this was demonstrated for the patient..  Would still recommend a compression sock, and she should wear this around the clock except for showering.  Reinforced scar massage with the patient.  Reinforced with the patient that it is best to put this on when her foot has been elevated and has the least amount of edema and then the sock should actually keep the foot from swelling.  She will follow-up here in 4 weeks.  Follow-Up Instructions: Return in about 4 weeks (around 01/12/2019).   Ortho Exam  Patient is alert, oriented, no adenopathy, well-dressed, normal affect, normal respiratory effort. The right great toe incision is healing well and is without signs of cellulitis or infection.  There is no sign of gouty arthropathy.  There is minimal edema over the foot and ankle.  She has good palpable pedal pulses.  She has mildly tender over the incisional area.  She does have decreased ankle range of  motion tight Achilles tendon and Achilles stretching was demonstrated to the patient.  Imaging: No results found. No images are attached to the encounter.  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 04/27/2015     Lab Results  Component Value Date   ALBUMIN 3.6 07/23/2018   ALBUMIN 4.1 02/19/2018   ALBUMIN 3.9 10/16/2017    Body mass index is 32.69 kg/m.  Orders:  Orders Placed This Encounter  Procedures   XR Foot Complete Right   No orders of the defined types were placed in this encounter.    Procedures: No procedures performed  Clinical Data: No additional findings.  ROS:  All other systems negative, except as noted in the HPI. Review of Systems  Objective: Vital Signs: Ht 5\' 1"  (1.549 m)    Wt 173 lb (78.5 kg)    LMP  (LMP Unknown)    BMI 32.69 kg/m   Specialty Comments:  No specialty comments available.  PMFS History: Patient Active Problem List   Diagnosis Date Noted   Hallux rigidus, right foot 03/11/2018   Achilles tendon  contracture, right 03/11/2018   Impingement syndrome of right ankle    Sprain of calcaneofibular ligament of right ankle 07/04/2017   Ankle pain, right 06/24/2017   Hyperglycemia 02/18/2017   Hepatitis B immune 02/18/2017   Fibroadenoma of breast 02/18/2017   S/P total abdominal hysterectomy 06/28/2015   Abnormal uterine bleeding (AUB) 05/03/2015   Condyloma acuminatum 02/23/2015   Fibroids 01/05/2015   Urinary incontinence 01/05/2015   Abnormal finding on GI tract imaging 10/12/2013   Anemia-chronic 10/09/2013   Achalasia s/p laparoscopic Heller myotomy and Dor fundoplication 03/83/3383   Ganglion cyst of finger of right hand 07/06/2013   Trichomoniasis of vagina 06/03/2013   GERD  (gastroesophageal reflux disease) 03/16/2013   Depression 02/10/2009   Human immunodeficiency virus (HIV) disease ([place]) 03/24/2007   Anxiety state 03/24/2007   ASTHMA 03/24/2007   Past Medical History:  Diagnosis Date   Abnormal uterine bleeding (AUB)    Achalasia    Type 2   Anemia    Anxiety    Asthma    Bipolar disorder ([facility])    Mixed   Bulging lumbar disc    Chronic back pain    Condyloma acuminata    Depression    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   Ganglion cyst    Right hand   Genital HSV    GERD (gastroesophageal reflux disease)    Hepatitis    States she got a shot for this   HIV infection ([place])    Migraines    Pneumonia    Pre-diabetes    Trichimoniasis    Urinary incontinence    Uterine fibroid    Wears glasses     Family History  Problem Relation Age of Onset   Hyperlipidemia Mother    Diabetes Mother    Stroke Mother    Diabetes Brother    Colon cancer Father    Esophageal cancer Neg Hx    Rectal cancer Neg Hx    Stomach cancer Neg Hx     Past Surgical History:  Procedure Laterality Date   ABDOMINAL HYSTERECTOMY N/A 06/28/2015   Procedure: HYSTERECTOMY ABDOMINAL;  Surgeon: Osborne Oman, MD;  Location: [facility];  Service: Gynecology;  Laterality: N/A;  Requested 06/28/15 @ 7:30a   ANKLE ARTHROSCOPY Right 10/16/2017   Procedure: RIGHT ANKLE ARTHROSCOPIC DEBRIDEMENT;  Surgeon: Newt Minion, MD;  Location: [facility];  Service: Orthopedics;  Laterality: Right;   ARTHRODESIS METATARSALPHALANGEAL JOINT (MTPJ) Right 07/23/2018   Procedure: RIGHT GREAT TOE METATARSOPHALANGEAL JOINT FUSION;  Surgeon: Newt Minion, MD;  Location: [facility];  Service: Orthopedics;  Laterality: Right;   BILATERAL SALPINGECTOMY Bilateral 06/28/2015   Procedure: BILATERAL SALPINGECTOMY;  Surgeon: Osborne Oman, MD;  Location: [facility];  Service: Gynecology;  Laterality: Bilateral;   CESAREAN SECTION     twice;  vertical incision x 2    COLONOSCOPY N/A 10/12/2013   Procedure: COLONOSCOPY;  Surgeon: Jerene Bears, MD;  [facility];  Service: Endoscopy;  Laterality: N/A;   CYSTO WITH HYDRODISTENSION N/A 05/27/2018   Procedure: CYSTOSCOPY/HYDRODISTENSION AND INSTILLATION;  Surgeon: Bjorn Loser, MD;  Location[facility];  Service: Urology;  Laterality: N/A;   ESOPHAGEAL MANOMETRY N/A 06/29/2013   Procedure: ESOPHAGEAL MANOMETRY (EM);  Surgeon: Jerene Bears, MD;  [facility];  Service: Gastroenterology;  Laterality: N/A;   FOREIGN BODY REMOVAL Left 2018   fiberglass, left thumb   HELLER MYOTOMY N/A 10/08/2013   Procedure: LAPAROSCOPIC HELLER MYOTOMY, DOR FUNDIPLICATION, UPPER ENDOSCOPY;  Surgeon: Odis Hollingshead, MD;  [facility];  Service: General;  Laterality: N/A;   HERNIA REPAIR     Umbilical   HYSTEROSCOPY WITH RESECTOSCOPE N/A 05/03/2015   Procedure: Diagnostic Hysteroscopy;  Surgeon: Lavonia Drafts, MD;  Location: [facility];  Service: Gynecology;  Laterality: N/A;  wants Novasure and myosure.   LYSIS OF ADHESION  06/28/2015   Procedure: LYSIS OF ADHESION;  Surgeon: Osborne Oman, MD;  Location: [facility];  Service: Gynecology;;   OTHER SURGICAL HISTORY     biopsy axillary ,right arm   right arm biopsy     UPPER GI ENDOSCOPY N/A 10/08/2013   Procedure: UPPER GI ENDOSCOPY;  Surgeon: Odis Hollingshead, MD;  [facility];  Service: General;  Laterality: N/A;   WISDOM TOOTH EXTRACTION     Social History   Occupational History   Occupation: House wife  Tobacco Use   Smoking status: Never Smoker   Smokeless tobacco: Never Used  Substance and Sexual Activity   Alcohol use: No   Drug use: No   Sexual activity: Not Currently    Partners: Male    Birth control/protection: Surgical

## 2018-12-17 ENCOUNTER — Encounter (INDEPENDENT_AMBULATORY_CARE_PROVIDER_SITE_OTHER): Payer: Self-pay | Admitting: Physician Assistant

## 2018-12-26 ENCOUNTER — Other Ambulatory Visit (INDEPENDENT_AMBULATORY_CARE_PROVIDER_SITE_OTHER): Payer: Self-pay | Admitting: Family

## 2018-12-29 ENCOUNTER — Other Ambulatory Visit: Payer: Self-pay | Admitting: Infectious Diseases

## 2018-12-29 DIAGNOSIS — B20 Human immunodeficiency virus [HIV] disease: Secondary | ICD-10-CM

## 2019-01-07 DIAGNOSIS — Z7189 Other specified counseling: Secondary | ICD-10-CM | POA: Diagnosis not present

## 2019-01-07 DIAGNOSIS — E782 Mixed hyperlipidemia: Secondary | ICD-10-CM | POA: Diagnosis not present

## 2019-01-07 DIAGNOSIS — B9735 Human immunodeficiency virus, type 2 [HIV 2] as the cause of diseases classified elsewhere: Secondary | ICD-10-CM | POA: Diagnosis not present

## 2019-01-07 DIAGNOSIS — R7303 Prediabetes: Secondary | ICD-10-CM | POA: Diagnosis not present

## 2019-01-12 ENCOUNTER — Other Ambulatory Visit: Payer: Self-pay

## 2019-01-12 ENCOUNTER — Encounter: Payer: Self-pay | Admitting: Orthopedic Surgery

## 2019-01-12 ENCOUNTER — Ambulatory Visit (INDEPENDENT_AMBULATORY_CARE_PROVIDER_SITE_OTHER): Payer: Medicare HMO | Admitting: Physician Assistant

## 2019-01-12 VITALS — Ht 61.0 in | Wt 173.0 lb

## 2019-01-12 DIAGNOSIS — M2021 Hallux rigidus, right foot: Secondary | ICD-10-CM

## 2019-01-12 DIAGNOSIS — M6701 Short Achilles tendon (acquired), right ankle: Secondary | ICD-10-CM

## 2019-01-12 NOTE — Progress Notes (Signed)
Office Visit Note   Patient: Danielle [name]           Date of Birth: 28-Dec-1970           MRN: [id number] Visit Date: 01/12/2019              Requested by: Campbell Riches, MD [address] [place], [place] [address] PCP: Campbell Riches, MD  Chief Complaint  Patient presents with  . Right Foot - Follow-up    07/23/18 right great toe fusion      HPI: The patient is a 48 yo woman who is seen for post operative follow up following fusion of her right great toe. She is wearing her Vive compression socks, but reports she feels like it is too tight and has to remove it during the day. She is wearing a small size. She reports she has been doing some Achilles stretching exercises, but still does not have much ankle range of motion. She does have more discomfort when she tries to wear a regular shoe and is still mostly wearing her post op shoe. She reports some numbness or funny feelings over the tip of the great toe following surgery and we discussed that this is typical and should gradually improve with time.   Assessment & Plan: Visit Diagnoses:  1. Hallux rigidus, right foot   2. Achilles tendon contracture, right     Plan: Reinforced Achille's stretching with the patient. Calf is 38 cm, so she will likely be more comfortable in a medium medical compression sock and she was given a prescription for this. Recommended to continue scar massage. Gradual work back into her regular shoes.  Follow up prn if does not feel she is continuing to progress well.   Follow-Up Instructions: Return if symptoms worsen or fail to improve.   Ortho Exam  Patient is alert, oriented, no adenopathy, well-dressed, normal affect, normal respiratory effort. Well healed right great toe incision without hypertrophy or keloid of the scar. Non tender to palpation. Dorsiflexion is to neutral and achilles stretching recommended. Bilateral calves are 38 cm. No signs of infection or cellulitis.    Imaging: No results found. No images are attached to the encounter.  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 04/27/2015     Lab Results  Component Value Date   ALBUMIN 3.6 07/23/2018   ALBUMIN 4.1 02/19/2018   ALBUMIN 3.9 10/16/2017    Body mass index is 32.69 kg/m.  Orders:  No orders of the defined types were placed in this encounter.  No orders of the defined types were placed in this encounter.    Procedures: No procedures performed  Clinical Data: No additional findings.  ROS:  All other systems negative, except as noted in the HPI. Review of Systems  Objective: Vital Signs: Ht 5\' 1"  (1.549 m)   Wt 173 lb (78.5 kg)   LMP  (LMP Unknown)   BMI 32.69 kg/m   Specialty Comments:  No specialty comments available.  PMFS History: Patient Active Problem List   Diagnosis Date Noted  . Hallux rigidus, right foot 03/11/2018  . 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 Impingement syndrome of right ankle   . Sprain of calcaneofibular ligament of right ankle 07/04/2017  .  Ankle pain, right 06/24/2017  . Hyperglycemia 02/18/2017  . Hepatitis B immune 02/18/2017  . 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 S/P total abdominal hysterectomy 06/28/2015  . Abnormal uterine bleeding (AUB) 05/03/2015  . Condyloma acuminatum 02/23/2015  . Fibroids 01/05/2015  . Urinary incontinence 01/05/2015  . Abnormal finding on GI tract imaging 10/12/2013  . Anemia-chronic 10/09/2013  . Achalasia s/p laparoscopic Heller myotomy and Dor fundoplication 53/66/4403  . Ganglion cyst of finger of right hand 07/06/2013  . Trichomoniasis of vagina 06/03/2013  . GERD (gastroesophageal reflux disease) 03/16/2013  . Depression 02/10/2009  . Human immunodeficiency virus  (HIV) disease ([place]) 03/24/2007  . Anxiety state 03/24/2007  . ASTHMA 03/24/2007   Past Medical History:  Diagnosis Date  . Abnormal uterine bleeding (AUB)   . Achalasia    Type 2  . Anemia   . Anxiety   . Asthma   . Bipolar disorder ([place])    Mixed  . Bulging lumbar disc   . Chronic back pain   . Condyloma acuminata   . Depression   . 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 Ganglion cyst    Right hand  . Genital HSV   . GERD (gastroesophageal reflux disease)   . Hepatitis    States she got a shot for this  . HIV infection ([place])   . Migraines   . Pneumonia   . Pre-diabetes   . Trichimoniasis   . Urinary incontinence   . Uterine fibroid   . Wears glasses     Family History  Problem Relation Age of Onset  . Hyperlipidemia Mother   . Diabetes Mother   . Stroke Mother   . Diabetes Brother   . Colon cancer Father   . Esophageal cancer Neg Hx   . Rectal cancer Neg Hx   . Stomach cancer Neg Hx     Past Surgical History:  Procedure Laterality Date  . ABDOMINAL HYSTERECTOMY N/A 06/28/2015   Procedure: HYSTERECTOMY ABDOMINAL;  Surgeon: Osborne Oman, MD;  Location: [facility];  Service: Gynecology;  Laterality: N/A;  Requested 06/28/15 @ 7:30a  . ANKLE ARTHROSCOPY Right 10/16/2017   Procedure: RIGHT ANKLE ARTHROSCOPIC DEBRIDEMENT;  Surgeon: Newt Minion, MD;  Location: [facility];  Service: Orthopedics;  Laterality: Right;  . ARTHRODESIS METATARSALPHALANGEAL JOINT (MTPJ) Right 07/23/2018   Procedure: RIGHT GREAT TOE METATARSOPHALANGEAL JOINT FUSION;  Surgeon: Newt Minion, MD;  Location: [facility];  Service: Orthopedics;  Laterality: Right;  . BILATERAL SALPINGECTOMY Bilateral 06/28/2015   Procedure: BILATERAL SALPINGECTOMY;  Surgeon: Osborne Oman, MD;  Location: [facility];  Service: Gynecology;  Laterality: Bilateral;  . CESAREAN SECTION     twice;  vertical incision x 2  . COLONOSCOPY N/A 10/12/2013   Procedure: COLONOSCOPY;  Surgeon: Jerene Bears, MD;  Location: [facility];   Service: Endoscopy;  Laterality: N/A;  . CYSTO WITH HYDRODISTENSION N/A 05/27/2018   Procedure: CYSTOSCOPY/HYDRODISTENSION AND INSTILLATION;  Surgeon: Bjorn Loser, MD;  Location[facility];  Service: Urology;  Laterality: N/A;  . ESOPHAGEAL MANOMETRY N/A 06/29/2013   Procedure: ESOPHAGEAL MANOMETRY (EM);  Surgeon: Jerene Bears, MD;  Location: [facility];  Service: Gastroenterology;  Laterality: N/A;  . FOREIGN BODY REMOVAL Left 2018   fiberglass, left thumb  . HELLER MYOTOMY N/A 10/08/2013   Procedure: LAPAROSCOPIC HELLER MYOTOMY, DOR FUNDIPLICATION, UPPER ENDOSCOPY;  Surgeon: Odis Hollingshead, MD;  Location: [facility];  Service: General;  Laterality: N/A;  . HERNIA REPAIR  Umbilical  . HYSTEROSCOPY WITH RESECTOSCOPE N/A 05/03/2015   Procedure: Diagnostic Hysteroscopy;  Surgeon: Lavonia Drafts, MD;  Location: [facility];  Service: Gynecology;  Laterality: N/A;  wants Novasure and myosure.  Marland Kitchen LYSIS OF ADHESION  06/28/2015   Procedure: LYSIS OF ADHESION;  Surgeon: Osborne Oman, MD;  Location: [facility];  Service: Gynecology;;  . OTHER SURGICAL HISTORY     biopsy axillary ,right arm  . right arm biopsy    . UPPER GI ENDOSCOPY N/A 10/08/2013   Procedure: UPPER GI ENDOSCOPY;  Surgeon: Odis Hollingshead, MD;  Location: [facility];  Service: General;  Laterality: N/A;  . WISDOM TOOTH EXTRACTION     Social History   Occupational History  . Occupation: House wife  Tobacco Use  . Smoking status: Never Smoker  . Smokeless tobacco: Never Used  Substance and Sexual Activity  . Alcohol use: No  . Drug use: No  . Sexual activity: Not Currently    Partners: Male    Birth control/protection: Surgical

## 2019-02-16 DIAGNOSIS — F3011 Manic episode without psychotic symptoms, mild: Secondary | ICD-10-CM | POA: Diagnosis not present

## 2019-02-23 DIAGNOSIS — F331 Major depressive disorder, recurrent, moderate: Secondary | ICD-10-CM | POA: Diagnosis not present

## 2019-02-23 DIAGNOSIS — G4700 Insomnia, unspecified: Secondary | ICD-10-CM | POA: Diagnosis not present

## 2019-02-23 DIAGNOSIS — F39 Unspecified mood [affective] disorder: Secondary | ICD-10-CM | POA: Diagnosis not present

## 2019-02-23 DIAGNOSIS — F411 Generalized anxiety disorder: Secondary | ICD-10-CM | POA: Diagnosis not present

## 2019-02-24 DIAGNOSIS — H5213 Myopia, bilateral: Secondary | ICD-10-CM | POA: Diagnosis not present

## 2019-02-28 DIAGNOSIS — D239 Other benign neoplasm of skin, unspecified: Secondary | ICD-10-CM | POA: Diagnosis not present

## 2019-02-28 DIAGNOSIS — D489 Neoplasm of uncertain behavior, unspecified: Secondary | ICD-10-CM | POA: Diagnosis not present

## 2019-03-02 DIAGNOSIS — F411 Generalized anxiety disorder: Secondary | ICD-10-CM | POA: Diagnosis not present

## 2019-03-02 DIAGNOSIS — F39 Unspecified mood [affective] disorder: Secondary | ICD-10-CM | POA: Diagnosis not present

## 2019-03-02 DIAGNOSIS — F331 Major depressive disorder, recurrent, moderate: Secondary | ICD-10-CM | POA: Diagnosis not present

## 2019-03-02 DIAGNOSIS — G4700 Insomnia, unspecified: Secondary | ICD-10-CM | POA: Diagnosis not present

## 2019-03-16 DIAGNOSIS — F39 Unspecified mood [affective] disorder: Secondary | ICD-10-CM | POA: Diagnosis not present

## 2019-03-16 DIAGNOSIS — G4700 Insomnia, unspecified: Secondary | ICD-10-CM | POA: Diagnosis not present

## 2019-03-16 DIAGNOSIS — F331 Major depressive disorder, recurrent, moderate: Secondary | ICD-10-CM | POA: Diagnosis not present

## 2019-03-16 DIAGNOSIS — F411 Generalized anxiety disorder: Secondary | ICD-10-CM | POA: Diagnosis not present

## 2019-03-25 DIAGNOSIS — F39 Unspecified mood [affective] disorder: Secondary | ICD-10-CM | POA: Diagnosis not present

## 2019-03-25 DIAGNOSIS — G4700 Insomnia, unspecified: Secondary | ICD-10-CM | POA: Diagnosis not present

## 2019-03-25 DIAGNOSIS — F331 Major depressive disorder, recurrent, moderate: Secondary | ICD-10-CM | POA: Diagnosis not present

## 2019-03-25 DIAGNOSIS — F411 Generalized anxiety disorder: Secondary | ICD-10-CM | POA: Diagnosis not present

## 2019-03-30 DIAGNOSIS — G4700 Insomnia, unspecified: Secondary | ICD-10-CM | POA: Diagnosis not present

## 2019-03-30 DIAGNOSIS — F411 Generalized anxiety disorder: Secondary | ICD-10-CM | POA: Diagnosis not present

## 2019-03-30 DIAGNOSIS — F39 Unspecified mood [affective] disorder: Secondary | ICD-10-CM | POA: Diagnosis not present

## 2019-03-30 DIAGNOSIS — F331 Major depressive disorder, recurrent, moderate: Secondary | ICD-10-CM | POA: Diagnosis not present

## 2019-03-31 ENCOUNTER — Other Ambulatory Visit: Payer: Self-pay

## 2019-03-31 ENCOUNTER — Other Ambulatory Visit: Payer: Medicare HMO

## 2019-03-31 ENCOUNTER — Other Ambulatory Visit (HOSPITAL_COMMUNITY)
Admission: RE | Admit: 2019-03-31 | Discharge: 2019-03-31 | Disposition: A | Discharge status: Home / Self Care | Payer: Medicare HMO | Source: Ambulatory Visit | Attending: Infectious Diseases | Admitting: Infectious Diseases

## 2019-03-31 DIAGNOSIS — Z79899 Other long term (current) drug therapy: Secondary | ICD-10-CM | POA: Diagnosis not present

## 2019-03-31 DIAGNOSIS — B20 Human immunodeficiency virus [HIV] disease: Secondary | ICD-10-CM

## 2019-03-31 DIAGNOSIS — Z113 Encounter for screening for infections with a predominantly sexual mode of transmission: Secondary | ICD-10-CM | POA: Diagnosis not present

## 2019-04-01 DIAGNOSIS — D485 Neoplasm of uncertain behavior of skin: Secondary | ICD-10-CM | POA: Diagnosis not present

## 2019-04-01 DIAGNOSIS — D489 Neoplasm of uncertain behavior, unspecified: Secondary | ICD-10-CM | POA: Diagnosis not present

## 2019-04-01 LAB — URINE CYTOLOGY ANCILLARY ONLY
Chlamydia: NEGATIVE
Neisseria Gonorrhea: NEGATIVE

## 2019-04-01 LAB — T-HELPER CELL (CD4) - (RCID CLINIC ONLY)
CD4 % Helper T Cell: 40 % (ref 33–65)
CD4 T Cell Abs: 935 /uL (ref 400–1790)

## 2019-04-03 LAB — LIPID PANEL
Cholesterol: 213 mg/dL — ABNORMAL HIGH (ref <200)
HDL: 50 mg/dL (ref >=50)
LDL Cholesterol (Calc): 129 mg/dL (calc) — ABNORMAL HIGH
Non-HDL Cholesterol (Calc): 163 mg/dL (calc) — ABNORMAL HIGH (ref <130)
Total CHOL/HDL Ratio: 4.3 (calc) (ref <5.0)
Triglycerides: 202 mg/dL — ABNORMAL HIGH (ref <150)

## 2019-04-03 LAB — CBC
HCT: 36.9 % (ref 35.0–45.0)
Hemoglobin: 12.2 g/dL (ref 11.7–15.5)
MCH: 29.5 pg (ref 27.0–33.0)
MCHC: 33.1 g/dL (ref 32.0–36.0)
MCV: 89.1 fL (ref 80.0–100.0)
MPV: 11.3 fL (ref 7.5–12.5)
Platelets: 226 10*3/uL (ref 140–400)
RBC: 4.14 10*6/uL (ref 3.80–5.10)
RDW: 13.7 % (ref 11.0–15.0)
WBC: 5.6 10*3/uL (ref 3.8–10.8)

## 2019-04-03 LAB — COMPREHENSIVE METABOLIC PANEL
AG Ratio: 1.4 (calc) (ref 1.0–2.5)
ALT: 8 U/L (ref 6–29)
AST: 10 U/L (ref 10–35)
Albumin: 3.9 g/dL (ref 3.6–5.1)
Alkaline phosphatase (APISO): 85 U/L (ref 31–125)
BUN: 14 mg/dL (ref 7–25)
CO2: 29 mmol/L (ref 20–32)
Calcium: 9.3 mg/dL (ref 8.6–10.2)
Chloride: 106 mmol/L (ref 98–110)
Creat: 1.07 mg/dL (ref 0.50–1.10)
Globulin: 2.7 g/dL (calc) (ref 1.9–3.7)
Glucose, Bld: 98 mg/dL (ref 65–99)
Potassium: 3.9 mmol/L (ref 3.5–5.3)
Sodium: 141 mmol/L (ref 135–146)
Total Bilirubin: 0.3 mg/dL (ref 0.2–1.2)
Total Protein: 6.6 g/dL (ref 6.1–8.1)

## 2019-04-03 LAB — RPR: RPR Ser Ql: NONREACTIVE

## 2019-04-03 LAB — HIV-1 RNA QUANT-NO REFLEX-BLD
HIV 1 RNA Quant: <20 copies/mL
HIV-1 RNA Quant, Log: <1.3 Log copies/mL

## 2019-04-07 DIAGNOSIS — D239 Other benign neoplasm of skin, unspecified: Secondary | ICD-10-CM | POA: Diagnosis not present

## 2019-04-08 DIAGNOSIS — R7303 Prediabetes: Secondary | ICD-10-CM | POA: Diagnosis not present

## 2019-04-08 DIAGNOSIS — B9735 Human immunodeficiency virus, type 2 [HIV 2] as the cause of diseases classified elsewhere: Secondary | ICD-10-CM | POA: Diagnosis not present

## 2019-04-08 DIAGNOSIS — G43009 Migraine without aura, not intractable, without status migrainosus: Secondary | ICD-10-CM | POA: Diagnosis not present

## 2019-04-08 DIAGNOSIS — E782 Mixed hyperlipidemia: Secondary | ICD-10-CM | POA: Diagnosis not present

## 2019-04-14 ENCOUNTER — Encounter: Payer: Medicare Other | Admitting: Infectious Diseases

## 2019-04-14 DIAGNOSIS — G4700 Insomnia, unspecified: Secondary | ICD-10-CM | POA: Diagnosis not present

## 2019-04-14 DIAGNOSIS — F331 Major depressive disorder, recurrent, moderate: Secondary | ICD-10-CM | POA: Diagnosis not present

## 2019-04-14 DIAGNOSIS — F39 Unspecified mood [affective] disorder: Secondary | ICD-10-CM | POA: Diagnosis not present

## 2019-04-14 DIAGNOSIS — F411 Generalized anxiety disorder: Secondary | ICD-10-CM | POA: Diagnosis not present

## 2019-04-23 DIAGNOSIS — F39 Unspecified mood [affective] disorder: Secondary | ICD-10-CM | POA: Diagnosis not present

## 2019-04-23 DIAGNOSIS — G4700 Insomnia, unspecified: Secondary | ICD-10-CM | POA: Diagnosis not present

## 2019-04-23 DIAGNOSIS — F331 Major depressive disorder, recurrent, moderate: Secondary | ICD-10-CM | POA: Diagnosis not present

## 2019-04-23 DIAGNOSIS — F411 Generalized anxiety disorder: Secondary | ICD-10-CM | POA: Diagnosis not present

## 2019-04-28 ENCOUNTER — Encounter: Payer: Self-pay | Admitting: Infectious Diseases

## 2019-04-28 ENCOUNTER — Ambulatory Visit (INDEPENDENT_AMBULATORY_CARE_PROVIDER_SITE_OTHER): Payer: Medicare HMO | Admitting: Infectious Diseases

## 2019-04-28 ENCOUNTER — Other Ambulatory Visit: Payer: Self-pay

## 2019-04-28 VITALS — BP 125/80 | HR 86 | Temp 99.5°F

## 2019-04-28 DIAGNOSIS — B20 Human immunodeficiency virus [HIV] disease: Secondary | ICD-10-CM | POA: Diagnosis not present

## 2019-04-28 DIAGNOSIS — Z79899 Other long term (current) drug therapy: Secondary | ICD-10-CM

## 2019-04-28 DIAGNOSIS — F411 Generalized anxiety disorder: Secondary | ICD-10-CM | POA: Diagnosis not present

## 2019-04-28 DIAGNOSIS — Z113 Encounter for screening for infections with a predominantly sexual mode of transmission: Secondary | ICD-10-CM

## 2019-04-28 NOTE — Progress Notes (Signed)
   Subjective:    Patient ID: Danielle [name], female    DOB: Feb 02, 1971, 48 y.o.   MRN: [id number]  HPI 47yo FHIV+ and history of GERD (and surgery), bipolar, R hand ganglion cyst removed 08-2016,and abnormal uterine bleed and hysterectomy.  Patient was previously onreyetaz/norvir/truvadathen swtiched to tivicay-descovy --> biktarvy. Denies missed or forgotten doses.  Is going to start low cholesterol diet.   Has metal plate and 6 screws on her big toe since surgery (07-23-19). Anticipates 1 yr in boot/healing.    PAP- last Apr 02, 2017 (-). Mammo (03-2017) nl- stable benign fibroadenomas.  Reminder her that she needs updates for this.   Husband is on truvada, is (-).HIV (-). Mother-in-law recently died 04-03-19). Stress from settling estate.  11 yo daughter may be starting [employer] (freshman). Son still in high school.   HIV 1 RNA Quant (copies/mL)  Date Value  03/31/2019 <20 NOT DETECTED  06/30/2018 <20 NOT DETECTED  12/23/2017 <20 NOT DETECTED   CD4 T Cell Abs (/uL)  Date Value  03/31/2019 935  06/30/2018 530  12/23/2017 670    Review of Systems  Constitutional: Negative for appetite change and unexpected weight change.  Respiratory: Negative for cough and shortness of breath.   Gastrointestinal: Negative for constipation and diarrhea.  Genitourinary: Negative for difficulty urinating.  Psychiatric/Behavioral: The patient is not nervous/anxious.   Please see HPI. All other systems reviewed and negative.     Objective:   Physical Exam Constitutional:      Appearance: Normal appearance.  HENT:     Mouth/Throat:     Mouth: Mucous membranes are moist.     Pharynx: Oropharynx is clear.  Eyes:     Extraocular Movements: Extraocular movements intact.     Pupils: Pupils are equal, round, and reactive to light.  Neck:     Musculoskeletal: Normal range of motion.  Cardiovascular:     Rate and Rhythm: Normal rate and regular rhythm.  Pulmonary:   Effort: Pulmonary effort is normal.     Breath sounds: Normal breath sounds.  Abdominal:     General: Bowel sounds are normal. There is no distension.     Palpations: Abdomen is soft.     Tenderness: There is no abdominal tenderness.  Musculoskeletal:     Right lower leg: No edema.     Left lower leg: No edema.  Neurological:     Mental Status: She is alert.  Psychiatric:        Mood and Affect: Mood normal.       Assessment & Plan:

## 2019-04-28 NOTE — Assessment & Plan Note (Addendum)
She is doing well  Will get her set up for repeat mammo and pap.  Offered/refused condoms.  Husband on Prep. Descovey.  PCV 13 deferred by pt.  Flu vax when available.  Will see her back in 9 months.

## 2019-04-28 NOTE — Assessment & Plan Note (Addendum)
She has virtual f/u with counselor.  I greatly appreciate his expertise and help.  She has questions about how to tell if her medication is working. I let her know that this is dependent on how she feels and how she lets [place] know how she feels.

## 2019-04-29 DIAGNOSIS — G4700 Insomnia, unspecified: Secondary | ICD-10-CM | POA: Diagnosis not present

## 2019-04-29 DIAGNOSIS — F411 Generalized anxiety disorder: Secondary | ICD-10-CM | POA: Diagnosis not present

## 2019-04-29 DIAGNOSIS — F331 Major depressive disorder, recurrent, moderate: Secondary | ICD-10-CM | POA: Diagnosis not present

## 2019-04-29 DIAGNOSIS — F39 Unspecified mood [affective] disorder: Secondary | ICD-10-CM | POA: Diagnosis not present

## 2019-05-05 DIAGNOSIS — N3941 Urge incontinence: Secondary | ICD-10-CM | POA: Diagnosis not present

## 2019-05-05 DIAGNOSIS — N3010 Interstitial cystitis (chronic) without hematuria: Secondary | ICD-10-CM | POA: Diagnosis not present

## 2019-05-13 DIAGNOSIS — D239 Other benign neoplasm of skin, unspecified: Secondary | ICD-10-CM | POA: Diagnosis not present

## 2019-05-20 DIAGNOSIS — G4700 Insomnia, unspecified: Secondary | ICD-10-CM | POA: Diagnosis not present

## 2019-05-20 DIAGNOSIS — F411 Generalized anxiety disorder: Secondary | ICD-10-CM | POA: Diagnosis not present

## 2019-05-20 DIAGNOSIS — F39 Unspecified mood [affective] disorder: Secondary | ICD-10-CM | POA: Diagnosis not present

## 2019-05-20 DIAGNOSIS — F331 Major depressive disorder, recurrent, moderate: Secondary | ICD-10-CM | POA: Diagnosis not present

## 2019-05-22 DIAGNOSIS — G4700 Insomnia, unspecified: Secondary | ICD-10-CM | POA: Diagnosis not present

## 2019-05-22 DIAGNOSIS — F39 Unspecified mood [affective] disorder: Secondary | ICD-10-CM | POA: Diagnosis not present

## 2019-05-22 DIAGNOSIS — F411 Generalized anxiety disorder: Secondary | ICD-10-CM | POA: Diagnosis not present

## 2019-05-22 DIAGNOSIS — F331 Major depressive disorder, recurrent, moderate: Secondary | ICD-10-CM | POA: Diagnosis not present

## 2019-05-26 ENCOUNTER — Other Ambulatory Visit (HOSPITAL_COMMUNITY)
Admission: RE | Admit: 2019-05-26 | Discharge: 2019-05-26 | Disposition: A | Discharge status: Home / Self Care | Payer: Medicare HMO | Source: Ambulatory Visit | Attending: Infectious Diseases | Admitting: Infectious Diseases

## 2019-05-26 ENCOUNTER — Other Ambulatory Visit: Payer: Self-pay

## 2019-05-26 ENCOUNTER — Ambulatory Visit (INDEPENDENT_AMBULATORY_CARE_PROVIDER_SITE_OTHER): Payer: Medicare HMO | Admitting: Infectious Diseases

## 2019-05-26 DIAGNOSIS — Z1151 Encounter for screening for human papillomavirus (HPV): Secondary | ICD-10-CM | POA: Diagnosis not present

## 2019-05-26 DIAGNOSIS — Z124 Encounter for screening for malignant neoplasm of cervix: Secondary | ICD-10-CM | POA: Insufficient documentation

## 2019-05-26 DIAGNOSIS — Z113 Encounter for screening for infections with a predominantly sexual mode of transmission: Secondary | ICD-10-CM | POA: Diagnosis not present

## 2019-05-26 DIAGNOSIS — B20 Human immunodeficiency virus [HIV] disease: Secondary | ICD-10-CM | POA: Diagnosis not present

## 2019-05-26 NOTE — Progress Notes (Signed)
      Subjective:    Danielle [name] is a 48 y.o. female here for an annual pelvic exam and vaginal pap smear.   Review of Systems: Current GYN complaints or concerns: hysterectomy done about 2 years ago for fibroids. Never any h/o abnormal pap smears prior to. No family or personal history of vaginal/ovarian/uterine or breast cancers. No concerns or complaints today.   Patient denies any abdominal/pelvic pain, problems with bowel movements, urination, vaginal discharge or intercourse.   Past Medical History:  Diagnosis Date  . Abnormal uterine bleeding (AUB)   . Achalasia    Type 2  . Anemia   . Anxiety   . Asthma   . Bipolar disorder ([place])    Mixed  . Bulging lumbar disc   . Chronic back pain   . Condyloma acuminata   . Depression   . 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 Ganglion cyst    Right hand  . Genital HSV   . GERD (gastroesophageal reflux disease)   . Hepatitis    States she got a shot for this  . HIV infection ([place])   . Migraines   . Pneumonia   . Pre-diabetes   . Trichimoniasis   . Urinary incontinence   . Uterine fibroid   . Wears glasses     Gynecologic History: [id number]  No LMP recorded (lmp unknown). Patient has had a hysterectomy. Contraception: status post hysterectomy Last Pap: 2018. Results were: normal Anal Intercourse:  Last Mammogram: Scheduled 07/2019  Objective:  Physical Exam  Constitutional: Well developed, well nourished, no acute distress. She is alert and oriented x3.  Pelvic: External genitalia is normal in appearance. The vagina is normal in appearance. The cervix is bulbous and easily visualized. No CMT, normal expected cervical mucus present. Bimanual exam reveals uterus that is felt to be normal size, shape, and contour. No adnexal masses or tenderness noted. Breasts: symmetrical in contour, shape and texture. No palpable masses/nodules. No nipple discharge.  Psych: She has a normal mood and affect.    Assessment:   Normal appearing vaginal cuff Surgically absent uterus Normal clinical breast exam Well-controlled HIV  Plan:  Health Maintenance =   Following hysterectomy for benign disease, routine screening for vaginal cancer is not recommended for women who are HIV (+); however women with a h/o high-grade CIN, adenocarcenoma in situ or other invasive cervical cancer are at increased risk and should be followed with annual vaginal cuff pap tests - reviewed previous vaginal and cervical cytologies and all have been negative beyond 10 years back. If normal today she does not need further testing going forward.  Vaginal sampling sent for cytology / hpv   Results will be communicated to the patient via telephone  She has been counseled and instructed how to perform monthly self breast exams.  Screening mammogram scheduled next month     Contraception / Family Planning =   Hysterectomy   HIV =   She will continue her Biktarvy and F/U as scheduled with Dr. Johnnye Sima for ongoing HIV care.    Janene Madeira, MSN, NP-C [facility] [email or web address] Pager: [phone] Office: [phone] [place]: [phone]   05/26/19 2:47 PM

## 2019-05-27 ENCOUNTER — Encounter: Payer: Self-pay | Admitting: Infectious Diseases

## 2019-05-28 LAB — URINE CYTOLOGY ANCILLARY ONLY
Chlamydia: NEGATIVE
Neisseria Gonorrhea: NEGATIVE

## 2019-06-02 LAB — CYTOLOGY - PAP
Chlamydia: NEGATIVE
Diagnosis: NEGATIVE
High risk HPV: NEGATIVE
Molecular Disclaimer: 56
Molecular Disclaimer: DETECTED
Molecular Disclaimer: NEGATIVE
Molecular Disclaimer: NORMAL
Neisseria Gonorrhea: NEGATIVE

## 2019-06-04 ENCOUNTER — Emergency Department (HOSPITAL_COMMUNITY): Payer: Medicare HMO

## 2019-06-04 ENCOUNTER — Emergency Department (HOSPITAL_COMMUNITY)
Admission: EM | Admit: 2019-06-04 | Discharge: 2019-06-04 | Disposition: A | Discharge status: Home / Self Care | Payer: Medicare HMO | Attending: Emergency Medicine | Admitting: Emergency Medicine

## 2019-06-04 DIAGNOSIS — J029 Acute pharyngitis, unspecified: Secondary | ICD-10-CM | POA: Insufficient documentation

## 2019-06-04 DIAGNOSIS — M940 Chondrocostal junction syndrome [Tietze]: Secondary | ICD-10-CM | POA: Insufficient documentation

## 2019-06-04 DIAGNOSIS — R079 Chest pain, unspecified: Secondary | ICD-10-CM | POA: Diagnosis not present

## 2019-06-04 DIAGNOSIS — R0789 Other chest pain: Secondary | ICD-10-CM | POA: Diagnosis present

## 2019-06-04 DIAGNOSIS — J45909 Unspecified asthma, uncomplicated: Secondary | ICD-10-CM | POA: Diagnosis not present

## 2019-06-04 DIAGNOSIS — R9431 Abnormal electrocardiogram [ECG] [EKG]: Secondary | ICD-10-CM | POA: Diagnosis not present

## 2019-06-04 LAB — BASIC METABOLIC PANEL
Anion gap: 9 (ref 5–15)
BUN: 12 mg/dL (ref 6–20)
CO2: 26 mmol/L (ref 22–32)
Calcium: 10.1 mg/dL (ref 8.9–10.3)
Chloride: 105 mmol/L (ref 98–111)
Creatinine, Ser: 1.09 mg/dL — ABNORMAL HIGH (ref 0.44–1.00)
GFR calc Af Amer: >60 mL/min (ref >60)
GFR calc non Af Amer: >60 mL/min (ref >60)
Glucose, Bld: 100 mg/dL — ABNORMAL HIGH (ref 70–99)
Potassium: 3.9 mmol/L (ref 3.5–5.1)
Sodium: 140 mmol/L (ref 135–145)

## 2019-06-04 LAB — CBC
HCT: 40.1 % (ref 36.0–46.0)
Hemoglobin: 12.9 g/dL (ref 12.0–15.0)
MCH: 29.8 pg (ref 26.0–34.0)
MCHC: 32.2 g/dL (ref 30.0–36.0)
MCV: 92.6 fL (ref 80.0–100.0)
Platelets: 222 10*3/uL (ref 150–400)
RBC: 4.33 MIL/uL (ref 3.87–5.11)
RDW: 13.4 % (ref 11.5–15.5)
WBC: 6.8 10*3/uL (ref 4.0–10.5)
nRBC: 0 % (ref 0.0–0.2)

## 2019-06-04 LAB — TROPONIN I (HIGH SENSITIVITY): Troponin I (High Sensitivity): 3 ng/L (ref <18)

## 2019-06-04 MED ORDER — KETOROLAC TROMETHAMINE 30 MG/ML IJ SOLN
30.0000 mg | Freq: Once | INTRAMUSCULAR | Status: AC
  Administered 2019-06-04: 19:00:00 30 mg via INTRAMUSCULAR
  Filled 2019-06-04: qty 1

## 2019-06-04 MED ORDER — SODIUM CHLORIDE 0.9% FLUSH: 3.0000 mL | Freq: Once | INTRAVENOUS | Status: DC

## 2019-06-04 NOTE — ED Triage Notes (Signed)
Pt hs sore throat CP and sob that started 3 days ago. Pt also having chills.

## 2019-06-04 NOTE — Discharge Instructions (Addendum)
Please return for any problem.  Follow-up with your regular care provider as instructed.  Use anti-inflammatories as instructed for treatment of your pain.

## 2019-06-04 NOTE — ED Provider Notes (Signed)
[facility] EMERGENCY DEPARTMENT Provider Note   CSN: [id number] Arrival date & time: 06/04/19  1320     History   Chief Complaint Chief Complaint  Patient presents with  . Chest Pain  . Sore Throat    HPI Danielle [name] is a 48 y.o. female.     48 year old female with prior medical history as detailed below presents for evaluation of reported chest discomfort.  Patient reports left-sided chest discomfort that has been present continuously for the last 3 days.  Patient's pain is exacerbated by movement of her chest wall.  She denies fever.  She denies cough or congestion.  She denies shortness of breath.  She has not taken anything for her symptoms.  She denies prior history of known CAD.  She reports that she is HIV positive with an undetectable viral load.  She denies known exposure to COVID positive patients or other sick contacts.  The history is provided by the patient and medical records.  Chest Pain Pain location:  L chest Pain quality: aching   Pain radiates to:  Does not radiate Pain severity:  Mild Onset quality:  Gradual Duration:  3 days Timing:  Constant Progression:  Unchanged Relieved by:  Nothing Worsened by:  Nothing Ineffective treatments:  None tried Sore Throat Associated symptoms include chest pain.    Past Medical History:  Diagnosis Date  . Abnormal uterine bleeding (AUB)   . Achalasia    Type 2  . Anemia   . Anxiety   . Asthma   . Bipolar disorder ([place])    Mixed  . Bulging lumbar disc   . Chronic back pain   . Condyloma acuminata   . Depression   . 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 Ganglion cyst    Right hand  . Genital HSV   . GERD (gastroesophageal reflux disease)   . Hepatitis    States she got a shot for this  . HIV infection ([place])   . Migraines   . Pneumonia   . Pre-diabetes   . Trichimoniasis   . Urinary incontinence   . Uterine fibroid   . Wears glasses     Patient Active Problem List   Diagnosis Date Noted  . Hallux rigidus, right foot 03/11/2018  . 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 Impingement syndrome of right ankle   . Sprain of calcaneofibular ligament of right ankle 07/04/2017  . Ankle pain, right 06/24/2017  . Hyperglycemia 02/18/2017  . Hepatitis B immune 02/18/2017  . 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 S/P total abdominal hysterectomy 06/28/2015  . Abnormal uterine bleeding (AUB) 05/03/2015  . Condyloma acuminatum 02/23/2015  . Fibroids 01/05/2015  . Urinary incontinence 01/05/2015  . Abnormal finding on GI tract imaging 10/12/2013  . Anemia-chronic 10/09/2013  . Achalasia s/p laparoscopic Heller myotomy and Dor fundoplication [id number]  . Ganglion cyst of finger of right hand 07/06/2013  . Trichomoniasis of vagina 06/03/2013  . GERD (gastroesophageal reflux disease) 03/16/2013  . Depression 02/10/2009  . Human immunodeficiency virus (HIV) disease ([place]) 03/24/2007  . Anxiety state 03/24/2007  . ASTHMA 03/24/2007    Past Surgical History:  Procedure Laterality Date  . ABDOMINAL HYSTERECTOMY N/A 06/28/2015   Procedure: HYSTERECTOMY ABDOMINAL;  Surgeon: Osborne Oman, MD;  Location: [facility];  Service: Gynecology;  Laterality: N/A;  Requested 06/28/15 @ 7:30a  . ANKLE ARTHROSCOPY Right 10/16/2017   Procedure: RIGHT ANKLE ARTHROSCOPIC DEBRIDEMENT;  Surgeon: Newt Minion, MD;  Location: [facility];  Service: Orthopedics;  Laterality: Right;  . ARTHRODESIS METATARSALPHALANGEAL JOINT (MTPJ) Right 07/23/2018   Procedure: RIGHT GREAT TOE METATARSOPHALANGEAL JOINT FUSION;  Surgeon: Newt Minion, MD;  Location: [place];  Service: Orthopedics;  Laterality: Right;  . BILATERAL SALPINGECTOMY Bilateral 06/28/2015   Procedure: BILATERAL SALPINGECTOMY;  Surgeon: Osborne Oman, MD;  Location: [facility];  Service: Gynecology;  Laterality: Bilateral;  . CESAREAN SECTION     twice;  vertical incision x 2  . COLONOSCOPY N/A 10/12/2013   Procedure: COLONOSCOPY;   Surgeon: Jerene Bears, MD;  Location: [facility];  Service: Endoscopy;  Laterality: N/A;  . CYSTO WITH HYDRODISTENSION N/A 05/27/2018   Procedure: CYSTOSCOPY/HYDRODISTENSION AND INSTILLATION;  Surgeon: Bjorn Loser, MD;  Location[facility];  Service: Urology;  Laterality: N/A;  . ESOPHAGEAL MANOMETRY N/A 06/29/2013   Procedure: ESOPHAGEAL MANOMETRY (EM);  Surgeon: Jerene Bears, MD;  Location: [facility];  Service: Gastroenterology;  Laterality: N/A;  . FOREIGN BODY REMOVAL Left 2018   fiberglass, left thumb  . HELLER MYOTOMY N/A 10/08/2013   Procedure: LAPAROSCOPIC HELLER MYOTOMY, DOR FUNDIPLICATION, UPPER ENDOSCOPY;  Surgeon: Odis Hollingshead, MD;  Location: [facility];  Service: General;  Laterality: N/A;  . HERNIA REPAIR     Umbilical  . HYSTEROSCOPY WITH RESECTOSCOPE N/A 05/03/2015   Procedure: Diagnostic Hysteroscopy;  Surgeon: Lavonia Drafts, MD;  Location: [facility];  Service: Gynecology;  Laterality: N/A;  wants Novasure and myosure.  Marland Kitchen LYSIS OF ADHESION  06/28/2015   Procedure: LYSIS OF ADHESION;  Surgeon: Osborne Oman, MD;  Location: [facility];  Service: Gynecology;;  . OTHER SURGICAL HISTORY     biopsy axillary ,right arm  . right arm biopsy    . UPPER GI ENDOSCOPY N/A 10/08/2013   Procedure: UPPER GI ENDOSCOPY;  Surgeon: Odis Hollingshead, MD;  Location: [facility];  Service: General;  Laterality: N/A;  . WISDOM TOOTH EXTRACTION       OB History    Gravida  5   Para  3   Term  3   Preterm      AB  2   Living  3     SAB  2   TAB      Ectopic      Multiple      Live Births               Home Medications    Prior to Admission medications   Medication Sig Start Date End Date Taking? Authorizing Provider  BIKTARVY 50-200-25 MG TABS tablet TAKE 1 TABLET BY MOUTH DAILY 12/29/18   Campbell Riches, MD  cyclobenzaprine (FLEXERIL) 10 MG tablet Take 1 tablet (10 mg total) by mouth 2 (two) times daily as needed for muscle spasms.  06/18/18   Wieters, Hallie C, PA-C  DULoxetine (CYMBALTA) 60 MG capsule Take 60 mg by mouth at bedtime. 07/08/18   [provider]  gabapentin (NEURONTIN) 100 MG capsule Take 3 capsules (300 mg total) by mouth at bedtime. 08/12/18   Rayburn, Neta Mends, PA-C  HYDROcodone-acetaminophen (NORCO/VICODIN) 5-325 MG tablet Take 1 tablet by mouth every 6 (six) hours as needed for moderate pain or severe pain. 09/09/18   Rayburn, Neta Mends, PA-C  ibuprofen (ADVIL,MOTRIN) 800 MG tablet Take 800 mg by mouth 3 (three) times daily as needed for pain. 05/05/18   [provider]  meloxicam (MOBIC) 7.5 MG tablet Take 1 tablet (7.5 mg total) by mouth daily. 06/23/18   Jaynee Eagles,  PA-C  omeprazole (PRILOSEC) 20 MG capsule TAKE 1 CAPSULE(20 MG) BY MOUTH DAILY 12/15/18   Campbell Riches, MD  PROAIR HFA 108 (90 BASE) MCG/ACT inhaler Inhale 2 puffs into the lungs every 4 (four) hours as needed for wheezing or shortness of breath. Shortness of breath/wheezing 12/31/14   [provider]  PULMICORT FLEXHALER 90 MCG/ACT inhaler Inhale 2 puffs into the lungs 2 (two) times daily. 02/24/18   Tasia Catchings, Amy V, PA-C  SUMAtriptan (IMITREX) 100 MG tablet Take 1 tablet (100 mg total) by mouth every 2 (two) hours as needed for migraine. May repeat in 2 hours if headache persists or recurs. 01/02/17   Lysbeth Penner, FNP  triamcinolone cream (KENALOG) 0.1 % Apply 1 application topically 2 (two) times daily. Patient taking differently: Apply 1 application topically 2 (two) times daily as needed (for rash/skin breakouts).  06/18/18   Wieters, Hallie C, PA-C  Trospium Chloride 60 MG CP24 Take 60 mg by mouth daily.    [provider]  valACYclovir (VALTREX) 500 MG tablet TAKE 1 TABLET(500 MG) BY MOUTH DAILY 08/11/18   Tommy Medal, Lavell Islam, MD  valACYclovir (VALTREX) 500 MG tablet TAKE 1 TABLET(500 MG) BY MOUTH DAILY 08/11/18   Tommy Medal, Lavell Islam, MD    Family History Family History  Problem  Relation Age of Onset  . Hyperlipidemia Mother   . Diabetes Mother   . Stroke Mother   . Diabetes Brother   . Colon cancer Father   . Esophageal cancer Neg Hx   . Rectal cancer Neg Hx   . Stomach cancer Neg Hx     Social History Social History   Tobacco Use  . Smoking status: Never Smoker  . Smokeless tobacco: Never Used  Substance Use Topics  . Alcohol use: No  . Drug use: No     Allergies   Latex   Review of Systems Review of Systems  Cardiovascular: Positive for chest pain.  All other systems reviewed and are negative.    Physical Exam Updated Vital Signs BP (!) 148/77 (BP Location: Right Arm)   Pulse 84   Temp 99.4 F (37.4 C) (Oral)   Resp 12   LMP  (LMP Unknown)   SpO2 100%   Physical Exam Vitals signs and nursing note reviewed.  Constitutional:      General: She is not in acute distress.    Appearance: She is well-developed.  HENT:     Head: Normocephalic and atraumatic.  Eyes:     Conjunctiva/sclera: Conjunctivae normal.     Pupils: Pupils are equal, round, and reactive to light.  Neck:     Musculoskeletal: Normal range of motion and neck supple.  Cardiovascular:     Rate and Rhythm: Normal rate and regular rhythm.     Heart sounds: Normal heart sounds.  Pulmonary:     Effort: Pulmonary effort is normal. No respiratory distress.     Breath sounds: Normal breath sounds.  Chest:     Chest wall: Tenderness present.     Comments: Exquisite tenderness over the left costochondral margin.  This reproduces the patient's pain completely. Abdominal:     General: There is no distension.     Palpations: Abdomen is soft.     Tenderness: There is no abdominal tenderness.  Musculoskeletal: Normal range of motion.        General: No deformity.  Skin:    General: Skin is warm and dry.  Neurological:     Mental Status:  She is alert and oriented to person, place, and time.      ED Treatments / Results  Labs (all labs ordered are listed, but only  abnormal results are displayed) Labs Reviewed  BASIC METABOLIC PANEL - Abnormal; Notable for the following components:      Result Value   Glucose, Bld 100 (*)    Creatinine, Ser 1.09 (*)    All other components within normal limits  CBC  TROPONIN I (HIGH SENSITIVITY)    EKG EKG Interpretation  Date/Time:  Thursday June 04 2019 13:32:39 EDT Ventricular Rate:  87 PR Interval:  124 QRS Duration: 82 QT Interval:  346 QTC Calculation: 416 R Axis:   39 Text Interpretation:  Normal sinus rhythm Nonspecific T wave abnormality Abnormal ECG Confirmed by Dene Gentry [phone]) on 06/04/2019 7:04:39 PM   Radiology Dg Chest Port 1 View  Result Date: 06/04/2019 CLINICAL DATA:  Chest pain. EXAM: PORTABLE CHEST 1 VIEW COMPARISON:  12/10/2018 FINDINGS: The cardiomediastinal contours are normal, upper normal heart size. The lungs are clear. Pulmonary vasculature is normal. No consolidation, pleural effusion, or pneumothorax. No acute osseous abnormalities are seen. IMPRESSION: No acute abnormality.  Upper normal heart size. Electronically Signed   By: Keith Rake M.D.   On: 06/04/2019 19:52    Procedures Procedures (including critical care time)  Medications Ordered in ED Medications  sodium chloride flush (NS) 0.9 % injection 3 mL (has no administration in time range)  ketorolac (TORADOL) 30 MG/ML injection 30 mg (30 mg Intramuscular Given 06/04/19 1919)     Initial Impression / Assessment and Plan / ED Course  I have reviewed the triage vital signs and the nursing notes.  Pertinent labs & imaging results that were available during my care of the patient were reviewed by me and considered in my medical decision making (see chart for details).        MDM  Screen complete  AZUMI ISAKSON was evaluated in Emergency Department on 06/04/2019 for the symptoms described in the history of present illness. She was evaluated in the context of the global COVID-19 pandemic, which  necessitated consideration that the patient might be at risk for infection with the SARS-CoV-2 virus that causes COVID-19. Institutional protocols and algorithms that pertain to the evaluation of patients at risk for COVID-19 are in a state of rapid change based on information released by regulatory bodies including the CDC and federal and state organizations. These policies and algorithms were followed during the patient's care in the ED.  Patient is presenting for evaluation of chest discomfort.  Her reported chest pain is atypical for ACS.  Her pain has been continuous for the last 3 days.  Pain is completely reproducible with palpation of the left costochondral margin.  Her heart score is less than 3.  Her high sensitivity troponin is 3.  Patient's presentation is consistent with costochondritis.  She feels improved following her ED evaluation and now desires discharge.  Importance of close follow-up is stressed.  Strict return precautions given and understood.   Final Clinical Impressions(s) / ED Diagnoses   Final diagnoses:  Costochondritis    ED Discharge Orders    None       Valarie Merino, MD 06/04/19 2005

## 2019-06-05 NOTE — Progress Notes (Signed)
Pap smear normal. Given she has had 3 negative consecutive tests will space out to every 3 years per [facility] recommendations. MyChart results sent to patient.

## 2019-06-11 DIAGNOSIS — R42 Dizziness and giddiness: Secondary | ICD-10-CM | POA: Diagnosis not present

## 2019-06-11 DIAGNOSIS — Z1159 Encounter for screening for other viral diseases: Secondary | ICD-10-CM | POA: Diagnosis not present

## 2019-06-11 DIAGNOSIS — R7303 Prediabetes: Secondary | ICD-10-CM | POA: Diagnosis not present

## 2019-06-11 DIAGNOSIS — B9735 Human immunodeficiency virus, type 2 [HIV 2] as the cause of diseases classified elsewhere: Secondary | ICD-10-CM | POA: Diagnosis not present

## 2019-06-11 DIAGNOSIS — F331 Major depressive disorder, recurrent, moderate: Secondary | ICD-10-CM | POA: Diagnosis not present

## 2019-06-11 DIAGNOSIS — G4700 Insomnia, unspecified: Secondary | ICD-10-CM | POA: Diagnosis not present

## 2019-06-11 DIAGNOSIS — R6883 Chills (without fever): Secondary | ICD-10-CM | POA: Diagnosis not present

## 2019-06-11 DIAGNOSIS — F39 Unspecified mood [affective] disorder: Secondary | ICD-10-CM | POA: Diagnosis not present

## 2019-06-11 DIAGNOSIS — F411 Generalized anxiety disorder: Secondary | ICD-10-CM | POA: Diagnosis not present

## 2019-06-11 DIAGNOSIS — Z7189 Other specified counseling: Secondary | ICD-10-CM | POA: Diagnosis not present

## 2019-06-17 DIAGNOSIS — G4700 Insomnia, unspecified: Secondary | ICD-10-CM | POA: Diagnosis not present

## 2019-06-17 DIAGNOSIS — F331 Major depressive disorder, recurrent, moderate: Secondary | ICD-10-CM | POA: Diagnosis not present

## 2019-06-17 DIAGNOSIS — F411 Generalized anxiety disorder: Secondary | ICD-10-CM | POA: Diagnosis not present

## 2019-06-17 DIAGNOSIS — F39 Unspecified mood [affective] disorder: Secondary | ICD-10-CM | POA: Diagnosis not present

## 2019-06-18 NOTE — ED Provider Notes (Signed)
[facility]    CSN: [id number] Arrival date & time: 09/29/18  1323      History   Chief Complaint Chief Complaint  Patient presents with  . URI    HPI Danielle [name] is a 48 y.o. female.   Pt c/o watery itchy eyes x1 day. Also c/o scratchy throat and weakening voice.     Past Medical History:  Diagnosis Date  . Abnormal uterine bleeding (AUB)   . Achalasia    Type 2  . Anemia   . Anxiety   . Asthma   . Bipolar disorder ([place])    Mixed  . Bulging lumbar disc   . Chronic back pain   . Condyloma acuminata   . Depression   . 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 Ganglion cyst    Right hand  . Genital HSV   . GERD (gastroesophageal reflux disease)   . Hepatitis    States she got a shot for this  . HIV infection ([place])   . Migraines   . Pneumonia   . Pre-diabetes   . Trichimoniasis   . Urinary incontinence   . Uterine fibroid   . Wears glasses     Patient Active Problem List   Diagnosis Date Noted  . Hallux rigidus, right foot 03/11/2018  . 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 Impingement syndrome of right ankle   . Sprain of calcaneofibular ligament of right ankle 07/04/2017  . Ankle pain, right 06/24/2017  . Hyperglycemia 02/18/2017  . Hepatitis B immune 02/18/2017  . 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 S/P total abdominal hysterectomy 06/28/2015  . Abnormal uterine bleeding (AUB) 05/03/2015  . Condyloma acuminatum 02/23/2015  . Fibroids 01/05/2015  . Urinary incontinence 01/05/2015  . Abnormal finding on GI tract imaging 10/12/2013  . Anemia-chronic 10/09/2013  . Achalasia s/p laparoscopic Heller myotomy and Dor fundoplication [id number]  . Ganglion cyst of finger of right hand 07/06/2013  . Trichomoniasis of vagina 06/03/2013  . GERD (gastroesophageal reflux disease) 03/16/2013  . Depression 02/10/2009  . Human immunodeficiency virus (HIV) disease ([place]) 03/24/2007  . Anxiety state 03/24/2007  . ASTHMA 03/24/2007    Past Surgical History:  Procedure Laterality Date  . ABDOMINAL HYSTERECTOMY N/A 06/28/2015   Procedure: HYSTERECTOMY ABDOMINAL;  Surgeon: Osborne Oman, MD;  Location: [facility];  Service: Gynecology;  Laterality: N/A;  Requested 06/28/15 @ 7:30a  . ANKLE ARTHROSCOPY Right 10/16/2017   Procedure: RIGHT ANKLE ARTHROSCOPIC DEBRIDEMENT;  Surgeon: Newt Minion, MD;  Location: [facility];  Service: Orthopedics;  Laterality: Right;  . ARTHRODESIS METATARSALPHALANGEAL JOINT (MTPJ) Right 07/23/2018   Procedure: RIGHT GREAT TOE METATARSOPHALANGEAL JOINT FUSION;  Surgeon: Newt Minion, MD;  Location: [facility];  Service: Orthopedics;  Laterality: Right;  . BILATERAL SALPINGECTOMY Bilateral 06/28/2015   Procedure: BILATERAL SALPINGECTOMY;  Surgeon: Osborne Oman, MD;  Location: [facility];  Service: Gynecology;  Laterality: Bilateral;  . CESAREAN SECTION     twice;  vertical incision x 2  . COLONOSCOPY N/A 10/12/2013   Procedure: COLONOSCOPY;  Surgeon: Jerene Bears, MD;  [facility];  Service: Endoscopy;  Laterality: N/A;  . CYSTO WITH HYDRODISTENSION N/A 05/27/2018   Procedure: CYSTOSCOPY/HYDRODISTENSION AND INSTILLATION;  Surgeon: Bjorn Loser, MD;  Location[facility];  Service: Urology;  Laterality: N/A;  . ESOPHAGEAL MANOMETRY N/A 06/29/2013   Procedure: ESOPHAGEAL MANOMETRY (EM);  Surgeon: Jerene Bears, MD;  [facility];  Service: Gastroenterology;  Laterality: N/A;  .  FOREIGN BODY REMOVAL Left 2018   fiberglass, left thumb  . HELLER MYOTOMY N/A 10/08/2013   Procedure: LAPAROSCOPIC HELLER MYOTOMY, DOR FUNDIPLICATION, UPPER ENDOSCOPY;  Surgeon: Odis Hollingshead, MD;  [facility];  Service: General;  Laterality: N/A;  . HERNIA REPAIR     Umbilical  . HYSTEROSCOPY WITH RESECTOSCOPE N/A 05/03/2015   Procedure: Diagnostic Hysteroscopy;  Surgeon: Lavonia Drafts, MD;  Location: [facility];  Service: Gynecology;  Laterality: N/A;  wants Novasure and myosure.   Marland Kitchen LYSIS OF ADHESION  06/28/2015   Procedure: LYSIS OF ADHESION;  Surgeon: Osborne Oman, MD;  Location: [facility];  Service: Gynecology;;  . OTHER SURGICAL HISTORY     biopsy axillary ,right arm  . right arm biopsy    . UPPER GI ENDOSCOPY N/A 10/08/2013   Procedure: UPPER GI ENDOSCOPY;  Surgeon: Odis Hollingshead, MD;  [facility];  Service: General;  Laterality: N/A;  . WISDOM TOOTH EXTRACTION      OB History    Gravida  5   Para  3   Term  3   Preterm      AB  2   Living  3     SAB  2   TAB      Ectopic      Multiple      Live Births               Home Medications    Prior to Admission medications   Medication Sig Start Date End Date Taking? Authorizing Provider  Trospium Chloride 60 MG CP24 Take 60 mg by mouth daily.   Yes [provider]  atorvastatin (LIPITOR) 10 MG tablet Take 10 mg by mouth at bedtime. 04/09/19   [provider]  BIKTARVY 50-200-25 MG TABS tablet TAKE 1 TABLET BY MOUTH DAILY Patient taking differently: Take 1 tablet by mouth daily.  12/29/18   Campbell Riches, MD  omeprazole (PRILOSEC) 20 MG capsule TAKE 1 CAPSULE(20 MG) BY MOUTH DAILY Patient taking differently: Take 20 mg by mouth daily.  12/15/18   Campbell Riches, MD  PROAIR HFA 108 (90 BASE) MCG/ACT inhaler Inhale 2 puffs into the lungs every 4 (four) hours as needed for wheezing or shortness of breath.  12/31/14   [provider]  SUMAtriptan (IMITREX) 100 MG tablet Take 1 tablet (100 mg total) by mouth every 2 (two) hours as needed for migraine. May repeat in 2 hours if headache persists or recurs. 01/02/17   Lysbeth Penner, FNP  valACYclovir (VALTREX) 500 MG tablet TAKE 1 TABLET(500 MG) BY MOUTH DAILY Patient taking differently: Take 500 mg by mouth daily.  08/11/18   Truman Hayward, MD    Family History Family History  Problem Relation Age of Onset  . Hyperlipidemia Mother   . Diabetes Mother   . Stroke Mother   . Diabetes Brother    . Colon cancer Father   . Esophageal cancer Neg Hx   . Rectal cancer Neg Hx   . Stomach cancer Neg Hx     Social History Social History   Tobacco Use  . Smoking status: Never Smoker  . Smokeless tobacco: Never Used  Substance Use Topics  . Alcohol use: No  . Drug use: No     Allergies   Latex   Review of Systems Review of Systems   Physical Exam Triage Vital Signs ED Triage Vitals  Enc Vitals Group     BP 09/29/18 1438 (!)  153/65     Pulse Rate 09/29/18 1438 93     Resp 09/29/18 1438 18     Temp 09/29/18 1438 99 F (37.2 C)     Temp Source 09/29/18 1438 Temporal     SpO2 09/29/18 1438 100 %     Weight --      Height --      Head Circumference --      Peak Flow --      Pain Score 09/29/18 1432 2     Pain Loc --      Pain Edu? --      Excl. in [place]? --    No data found.  Updated Vital Signs BP (!) 153/65 (BP Location: Right Arm)   Pulse 93   Temp 99 F (37.2 C) (Temporal)   Resp 18   LMP  (LMP Unknown)   SpO2 100%   Visual Acuity Right Eye Distance:   Left Eye Distance:   Bilateral Distance:    Right Eye Near:   Left Eye Near:    Bilateral Near:     Physical Exam   UC Treatments / Results  Labs (all labs ordered are listed, but only abnormal results are displayed) Labs Reviewed - No data to display  EKG   Radiology No results found.  Procedures Procedures (including critical care time)  Medications Ordered in UC Medications - No data to display  Initial Impression / Assessment and Plan / UC Course  I have reviewed the triage vital signs and the nursing notes.  Pertinent labs & imaging results that were available during my care of the patient were reviewed by me and considered in my medical decision making (see chart for details).     Empiric tx of strep based on clinical appearance of throat Final Clinical Impressions(s) / UC Diagnoses   Final diagnoses:  Streptococcal sore throat   Discharge Instructions   None    ED  Prescriptions    Medication Sig Dispense Auth. Provider   penicillin v potassium (VEETID) 500 MG tablet Take 1 tablet (500 mg total) by mouth 2 (two) times daily for 10 days. 20 tablet Harrie Foreman, MD   trimethoprim-polymyxin b (POLYTRIM) ophthalmic solution Place 1 drop into both eyes every 6 (six) hours for 5 days. 10 mL Harrie Foreman, MD     PDMP not reviewed this encounter.   Harrie Foreman, MD 06/18/19 [phone]

## 2019-06-24 ENCOUNTER — Ambulatory Visit
Admission: RE | Admit: 2019-06-24 | Discharge: 2019-06-24 | Disposition: A | Discharge status: Home / Self Care | Payer: Medicare HMO | Source: Ambulatory Visit | Attending: Infectious Diseases | Admitting: Infectious Diseases

## 2019-06-24 ENCOUNTER — Other Ambulatory Visit: Payer: Self-pay

## 2019-06-24 DIAGNOSIS — Z1231 Encounter for screening mammogram for malignant neoplasm of breast: Secondary | ICD-10-CM | POA: Diagnosis not present

## 2019-06-24 DIAGNOSIS — Z113 Encounter for screening for infections with a predominantly sexual mode of transmission: Secondary | ICD-10-CM

## 2019-06-25 DIAGNOSIS — F39 Unspecified mood [affective] disorder: Secondary | ICD-10-CM | POA: Diagnosis not present

## 2019-06-25 DIAGNOSIS — F331 Major depressive disorder, recurrent, moderate: Secondary | ICD-10-CM | POA: Diagnosis not present

## 2019-06-25 DIAGNOSIS — F411 Generalized anxiety disorder: Secondary | ICD-10-CM | POA: Diagnosis not present

## 2019-06-25 DIAGNOSIS — G4700 Insomnia, unspecified: Secondary | ICD-10-CM | POA: Diagnosis not present

## 2019-06-30 NOTE — Telephone Encounter (Signed)
ECG reviewed no significant change, nothing to be concerned with.  thanks

## 2019-07-09 DIAGNOSIS — G4700 Insomnia, unspecified: Secondary | ICD-10-CM | POA: Diagnosis not present

## 2019-07-09 DIAGNOSIS — F411 Generalized anxiety disorder: Secondary | ICD-10-CM | POA: Diagnosis not present

## 2019-07-09 DIAGNOSIS — F331 Major depressive disorder, recurrent, moderate: Secondary | ICD-10-CM | POA: Diagnosis not present

## 2019-07-09 DIAGNOSIS — F39 Unspecified mood [affective] disorder: Secondary | ICD-10-CM | POA: Diagnosis not present

## 2019-07-20 DIAGNOSIS — G4700 Insomnia, unspecified: Secondary | ICD-10-CM | POA: Diagnosis not present

## 2019-07-20 DIAGNOSIS — F331 Major depressive disorder, recurrent, moderate: Secondary | ICD-10-CM | POA: Diagnosis not present

## 2019-07-20 DIAGNOSIS — F411 Generalized anxiety disorder: Secondary | ICD-10-CM | POA: Diagnosis not present

## 2019-07-20 DIAGNOSIS — F39 Unspecified mood [affective] disorder: Secondary | ICD-10-CM | POA: Diagnosis not present

## 2019-07-29 ENCOUNTER — Other Ambulatory Visit: Payer: Self-pay | Admitting: *Deleted

## 2019-07-29 DIAGNOSIS — B009 Herpesviral infection, unspecified: Secondary | ICD-10-CM

## 2019-07-29 MED ORDER — VALACYCLOVIR HCL 500 MG PO TABS: ORAL_TABLET | ORAL | 6 refills | Status: DC

## 2019-07-30 ENCOUNTER — Other Ambulatory Visit: Payer: Self-pay | Admitting: *Deleted

## 2019-07-30 DIAGNOSIS — K219 Gastro-esophageal reflux disease without esophagitis: Secondary | ICD-10-CM

## 2019-07-30 MED ORDER — OMEPRAZOLE 20 MG PO CPDR: DELAYED_RELEASE_CAPSULE | ORAL | 2 refills | Status: DC

## 2019-08-03 ENCOUNTER — Telehealth: Payer: Self-pay

## 2019-08-03 DIAGNOSIS — B20 Human immunodeficiency virus [HIV] disease: Secondary | ICD-10-CM

## 2019-08-03 MED ORDER — BIKTARVY 50-200-25 MG PO TABS: 1.0000 | ORAL_TABLET | Freq: Every day | ORAL | 3 refills | Status: DC

## 2019-08-03 NOTE — Telephone Encounter (Signed)
Patient called [facility] to request refill on Biktarvy and wants to change Original Rx from 90 day supply to 30 day supply. Due to insurance.  New Rx sent to [facility] with refills for 30 day supply.  Eugenia Mcalpine

## 2019-08-05 DIAGNOSIS — F411 Generalized anxiety disorder: Secondary | ICD-10-CM | POA: Diagnosis not present

## 2019-08-05 DIAGNOSIS — G4700 Insomnia, unspecified: Secondary | ICD-10-CM | POA: Diagnosis not present

## 2019-08-05 DIAGNOSIS — F39 Unspecified mood [affective] disorder: Secondary | ICD-10-CM | POA: Diagnosis not present

## 2019-08-05 DIAGNOSIS — F331 Major depressive disorder, recurrent, moderate: Secondary | ICD-10-CM | POA: Diagnosis not present

## 2019-08-20 DIAGNOSIS — G4700 Insomnia, unspecified: Secondary | ICD-10-CM | POA: Diagnosis not present

## 2019-08-20 DIAGNOSIS — F39 Unspecified mood [affective] disorder: Secondary | ICD-10-CM | POA: Diagnosis not present

## 2019-08-20 DIAGNOSIS — F411 Generalized anxiety disorder: Secondary | ICD-10-CM | POA: Diagnosis not present

## 2019-08-20 DIAGNOSIS — F331 Major depressive disorder, recurrent, moderate: Secondary | ICD-10-CM | POA: Diagnosis not present

## 2019-09-10 DIAGNOSIS — F39 Unspecified mood [affective] disorder: Secondary | ICD-10-CM | POA: Diagnosis not present

## 2019-09-10 DIAGNOSIS — F411 Generalized anxiety disorder: Secondary | ICD-10-CM | POA: Diagnosis not present

## 2019-09-10 DIAGNOSIS — F331 Major depressive disorder, recurrent, moderate: Secondary | ICD-10-CM | POA: Diagnosis not present

## 2019-09-10 DIAGNOSIS — G4700 Insomnia, unspecified: Secondary | ICD-10-CM | POA: Diagnosis not present

## 2019-09-17 DIAGNOSIS — F331 Major depressive disorder, recurrent, moderate: Secondary | ICD-10-CM | POA: Diagnosis not present

## 2019-09-17 DIAGNOSIS — F411 Generalized anxiety disorder: Secondary | ICD-10-CM | POA: Diagnosis not present

## 2019-09-17 DIAGNOSIS — F39 Unspecified mood [affective] disorder: Secondary | ICD-10-CM | POA: Diagnosis not present

## 2019-09-17 DIAGNOSIS — G4700 Insomnia, unspecified: Secondary | ICD-10-CM | POA: Diagnosis not present

## 2019-10-01 DIAGNOSIS — F39 Unspecified mood [affective] disorder: Secondary | ICD-10-CM | POA: Diagnosis not present

## 2019-10-01 DIAGNOSIS — F411 Generalized anxiety disorder: Secondary | ICD-10-CM | POA: Diagnosis not present

## 2019-10-01 DIAGNOSIS — F331 Major depressive disorder, recurrent, moderate: Secondary | ICD-10-CM | POA: Diagnosis not present

## 2019-10-01 DIAGNOSIS — G4700 Insomnia, unspecified: Secondary | ICD-10-CM | POA: Diagnosis not present

## 2019-10-08 ENCOUNTER — Ambulatory Visit
Admission: RE | Admit: 2019-10-08 | Discharge: 2019-10-08 | Disposition: A | Discharge status: Home / Self Care | Payer: Medicare HMO | Source: Ambulatory Visit | Attending: Nurse Practitioner | Admitting: Nurse Practitioner

## 2019-10-08 ENCOUNTER — Other Ambulatory Visit: Payer: Self-pay | Admitting: Nurse Practitioner

## 2019-10-08 DIAGNOSIS — E782 Mixed hyperlipidemia: Secondary | ICD-10-CM | POA: Diagnosis not present

## 2019-10-08 DIAGNOSIS — R030 Elevated blood-pressure reading, without diagnosis of hypertension: Secondary | ICD-10-CM | POA: Diagnosis not present

## 2019-10-08 DIAGNOSIS — B9735 Human immunodeficiency virus, type 2 [HIV 2] as the cause of diseases classified elsewhere: Secondary | ICD-10-CM | POA: Diagnosis not present

## 2019-10-08 DIAGNOSIS — D649 Anemia, unspecified: Secondary | ICD-10-CM | POA: Diagnosis not present

## 2019-10-08 DIAGNOSIS — M545 Low back pain: Secondary | ICD-10-CM | POA: Diagnosis not present

## 2019-10-08 DIAGNOSIS — G43009 Migraine without aura, not intractable, without status migrainosus: Secondary | ICD-10-CM | POA: Diagnosis not present

## 2019-10-08 DIAGNOSIS — Z7189 Other specified counseling: Secondary | ICD-10-CM | POA: Diagnosis not present

## 2019-10-08 DIAGNOSIS — R7303 Prediabetes: Secondary | ICD-10-CM | POA: Diagnosis not present

## 2019-10-08 DIAGNOSIS — M5489 Other dorsalgia: Secondary | ICD-10-CM

## 2019-10-08 DIAGNOSIS — M549 Dorsalgia, unspecified: Secondary | ICD-10-CM | POA: Diagnosis not present

## 2019-10-08 DIAGNOSIS — M47814 Spondylosis without myelopathy or radiculopathy, thoracic region: Secondary | ICD-10-CM | POA: Diagnosis not present

## 2019-10-11 DIAGNOSIS — E7849 Other hyperlipidemia: Secondary | ICD-10-CM | POA: Diagnosis not present

## 2019-10-11 DIAGNOSIS — Z21 Asymptomatic human immunodeficiency virus [HIV] infection status: Secondary | ICD-10-CM | POA: Diagnosis not present

## 2019-10-11 DIAGNOSIS — B9735 Human immunodeficiency virus, type 2 [HIV 2] as the cause of diseases classified elsewhere: Secondary | ICD-10-CM | POA: Diagnosis not present

## 2019-10-19 ENCOUNTER — Other Ambulatory Visit: Payer: Self-pay

## 2019-10-19 DIAGNOSIS — B20 Human immunodeficiency virus [HIV] disease: Secondary | ICD-10-CM

## 2019-10-19 MED ORDER — BIKTARVY 50-200-25 MG PO TABS: 1.0000 | ORAL_TABLET | Freq: Every day | ORAL | 1 refills | Status: DC

## 2019-10-20 ENCOUNTER — Other Ambulatory Visit: Payer: Self-pay

## 2019-10-20 DIAGNOSIS — B20 Human immunodeficiency virus [HIV] disease: Secondary | ICD-10-CM

## 2019-10-20 MED ORDER — BIKTARVY 50-200-25 MG PO TABS: 1.0000 | ORAL_TABLET | Freq: Every day | ORAL | 1 refills | Status: DC

## 2019-10-21 DIAGNOSIS — M545 Low back pain: Secondary | ICD-10-CM | POA: Diagnosis not present

## 2019-10-21 DIAGNOSIS — Z7189 Other specified counseling: Secondary | ICD-10-CM | POA: Diagnosis not present

## 2019-10-29 DIAGNOSIS — F331 Major depressive disorder, recurrent, moderate: Secondary | ICD-10-CM | POA: Diagnosis not present

## 2019-11-02 ENCOUNTER — Other Ambulatory Visit: Payer: Self-pay | Admitting: *Deleted

## 2019-11-02 ENCOUNTER — Telehealth: Payer: Self-pay | Admitting: *Deleted

## 2019-11-02 DIAGNOSIS — B20 Human immunodeficiency virus [HIV] disease: Secondary | ICD-10-CM

## 2019-11-02 MED ORDER — BIKTARVY 50-200-25 MG PO TABS: 1.0000 | ORAL_TABLET | Freq: Every day | ORAL | 5 refills | Status: DC

## 2019-11-02 NOTE — Telephone Encounter (Signed)
-----   Message from [place] sent at 11/02/2019  2:32 PM EST ----- Patient called about needing a refill on her medication, she said the pharmacy said she couldn't get a refill until next month but she is out of medication. Thank you

## 2019-11-02 NOTE — Telephone Encounter (Signed)
Spoke with patient, let her know that refills had been sent to [facility].  She will call them for refill.  She asked for Biktarvy to be sent for dispensing 1 month at a time with refills available (not 90 day supply).  RN sent in new rx.  She will call [employer] today and ask for overnight delivery, will call back if there are problems. Landis Gandy, RN

## 2019-11-11 DIAGNOSIS — G44209 Tension-type headache, unspecified, not intractable: Secondary | ICD-10-CM | POA: Diagnosis not present

## 2019-11-11 DIAGNOSIS — E782 Mixed hyperlipidemia: Secondary | ICD-10-CM | POA: Diagnosis not present

## 2019-11-11 DIAGNOSIS — R7309 Other abnormal glucose: Secondary | ICD-10-CM | POA: Diagnosis not present

## 2019-11-11 DIAGNOSIS — I10 Essential (primary) hypertension: Secondary | ICD-10-CM | POA: Diagnosis not present

## 2019-11-12 DIAGNOSIS — F39 Unspecified mood [affective] disorder: Secondary | ICD-10-CM | POA: Diagnosis not present

## 2019-11-24 DIAGNOSIS — F411 Generalized anxiety disorder: Secondary | ICD-10-CM | POA: Diagnosis not present

## 2019-11-24 DIAGNOSIS — F331 Major depressive disorder, recurrent, moderate: Secondary | ICD-10-CM | POA: Diagnosis not present

## 2019-11-24 DIAGNOSIS — G4700 Insomnia, unspecified: Secondary | ICD-10-CM | POA: Diagnosis not present

## 2019-11-24 DIAGNOSIS — F39 Unspecified mood [affective] disorder: Secondary | ICD-10-CM | POA: Diagnosis not present

## 2019-11-30 DIAGNOSIS — Z79899 Other long term (current) drug therapy: Secondary | ICD-10-CM | POA: Diagnosis not present

## 2019-11-30 DIAGNOSIS — Z049 Encounter for examination and observation for unspecified reason: Secondary | ICD-10-CM | POA: Diagnosis not present

## 2019-11-30 DIAGNOSIS — G43719 Chronic migraine without aura, intractable, without status migrainosus: Secondary | ICD-10-CM | POA: Diagnosis not present

## 2019-12-09 DIAGNOSIS — B9735 Human immunodeficiency virus, type 2 [HIV 2] as the cause of diseases classified elsewhere: Secondary | ICD-10-CM | POA: Diagnosis not present

## 2019-12-09 DIAGNOSIS — E7849 Other hyperlipidemia: Secondary | ICD-10-CM | POA: Diagnosis not present

## 2019-12-09 DIAGNOSIS — Z21 Asymptomatic human immunodeficiency virus [HIV] infection status: Secondary | ICD-10-CM | POA: Diagnosis not present

## 2019-12-10 DIAGNOSIS — G43009 Migraine without aura, not intractable, without status migrainosus: Secondary | ICD-10-CM | POA: Diagnosis not present

## 2019-12-10 DIAGNOSIS — E7849 Other hyperlipidemia: Secondary | ICD-10-CM | POA: Diagnosis not present

## 2019-12-10 DIAGNOSIS — R7309 Other abnormal glucose: Secondary | ICD-10-CM | POA: Diagnosis not present

## 2019-12-10 DIAGNOSIS — I10 Essential (primary) hypertension: Secondary | ICD-10-CM | POA: Diagnosis not present

## 2019-12-14 DIAGNOSIS — F39 Unspecified mood [affective] disorder: Secondary | ICD-10-CM | POA: Diagnosis not present

## 2019-12-14 DIAGNOSIS — F411 Generalized anxiety disorder: Secondary | ICD-10-CM | POA: Diagnosis not present

## 2019-12-26 ENCOUNTER — Other Ambulatory Visit: Payer: Self-pay | Admitting: Infectious Diseases

## 2019-12-26 DIAGNOSIS — B009 Herpesviral infection, unspecified: Secondary | ICD-10-CM

## 2019-12-28 DIAGNOSIS — F39 Unspecified mood [affective] disorder: Secondary | ICD-10-CM | POA: Diagnosis not present

## 2019-12-28 DIAGNOSIS — F411 Generalized anxiety disorder: Secondary | ICD-10-CM | POA: Diagnosis not present

## 2019-12-28 DIAGNOSIS — F331 Major depressive disorder, recurrent, moderate: Secondary | ICD-10-CM | POA: Diagnosis not present

## 2020-01-05 DIAGNOSIS — G43719 Chronic migraine without aura, intractable, without status migrainosus: Secondary | ICD-10-CM | POA: Diagnosis not present

## 2020-01-11 DIAGNOSIS — F3011 Manic episode without psychotic symptoms, mild: Secondary | ICD-10-CM | POA: Diagnosis not present

## 2020-01-11 DIAGNOSIS — F39 Unspecified mood [affective] disorder: Secondary | ICD-10-CM | POA: Diagnosis not present

## 2020-01-15 NOTE — Addendum Note (Signed)
Addended by: Dolan Amen D on: 01/15/2020 10:22 AM   Modules accepted: Orders

## 2020-01-18 ENCOUNTER — Other Ambulatory Visit: Payer: Self-pay

## 2020-01-18 ENCOUNTER — Other Ambulatory Visit: Payer: Medicare HMO

## 2020-01-18 ENCOUNTER — Other Ambulatory Visit (HOSPITAL_COMMUNITY)
Admission: RE | Admit: 2020-01-18 | Discharge: 2020-01-18 | Disposition: A | Discharge status: Home / Self Care | Payer: Medicare HMO | Source: Ambulatory Visit | Attending: Pediatrics | Admitting: Pediatrics

## 2020-01-18 DIAGNOSIS — B20 Human immunodeficiency virus [HIV] disease: Secondary | ICD-10-CM

## 2020-01-18 DIAGNOSIS — Z113 Encounter for screening for infections with a predominantly sexual mode of transmission: Secondary | ICD-10-CM | POA: Insufficient documentation

## 2020-01-18 DIAGNOSIS — Z79899 Other long term (current) drug therapy: Secondary | ICD-10-CM | POA: Diagnosis not present

## 2020-01-19 LAB — T-HELPER CELL (CD4) - (RCID CLINIC ONLY)
CD4 % Helper T Cell: 38 % (ref 33–65)
CD4 T Cell Abs: 800 /uL (ref 400–1790)

## 2020-01-19 LAB — URINE CYTOLOGY ANCILLARY ONLY
Chlamydia: NEGATIVE
Comment: NEGATIVE
Comment: NORMAL
Neisseria Gonorrhea: NEGATIVE

## 2020-01-20 LAB — CBC
HCT: 37.2 % (ref 35.0–45.0)
Hemoglobin: 12.2 g/dL (ref 11.7–15.5)
MCH: 29.3 pg (ref 27.0–33.0)
MCHC: 32.8 g/dL (ref 32.0–36.0)
MCV: 89.4 fL (ref 80.0–100.0)
MPV: 11.2 fL (ref 7.5–12.5)
Platelets: 224 10*3/uL (ref 140–400)
RBC: 4.16 10*6/uL (ref 3.80–5.10)
RDW: 13.4 % (ref 11.0–15.0)
WBC: 4.2 10*3/uL (ref 3.8–10.8)

## 2020-01-20 LAB — RPR TITER: RPR Titer: 1:1 {titer} — ABNORMAL HIGH

## 2020-01-20 LAB — COMPREHENSIVE METABOLIC PANEL
AG Ratio: 1.5 (calc) (ref 1.0–2.5)
ALT: 11 U/L (ref 6–29)
AST: 12 U/L (ref 10–35)
Albumin: 4.1 g/dL (ref 3.6–5.1)
Alkaline phosphatase (APISO): 109 U/L (ref 31–125)
BUN/Creatinine Ratio: 11 (calc) (ref 6–22)
BUN: 13 mg/dL (ref 7–25)
CO2: 23 mmol/L (ref 20–32)
Calcium: 9.3 mg/dL (ref 8.6–10.2)
Chloride: 109 mmol/L (ref 98–110)
Creat: 1.17 mg/dL — ABNORMAL HIGH (ref 0.50–1.10)
Globulin: 2.8 g/dL (calc) (ref 1.9–3.7)
Glucose, Bld: 94 mg/dL (ref 65–99)
Potassium: 3.8 mmol/L (ref 3.5–5.3)
Sodium: 140 mmol/L (ref 135–146)
Total Bilirubin: 0.4 mg/dL (ref 0.2–1.2)
Total Protein: 6.9 g/dL (ref 6.1–8.1)

## 2020-01-20 LAB — LIPID PANEL
Cholesterol: 129 mg/dL (ref <200)
HDL: 46 mg/dL — ABNORMAL LOW (ref >=50)
LDL Cholesterol (Calc): 65 mg/dL (calc)
Non-HDL Cholesterol (Calc): 83 mg/dL (calc) (ref <130)
Total CHOL/HDL Ratio: 2.8 (calc) (ref <5.0)
Triglycerides: 92 mg/dL (ref <150)

## 2020-01-20 LAB — FLUORESCENT TREPONEMAL AB(FTA)-IGG-BLD: Fluorescent Treponemal ABS: NONREACTIVE

## 2020-01-20 LAB — HIV-1 RNA QUANT-NO REFLEX-BLD
HIV 1 RNA Quant: <20 copies/mL
HIV-1 RNA Quant, Log: <1.3 Log copies/mL

## 2020-01-20 LAB — RPR: RPR Ser Ql: REACTIVE — AB

## 2020-01-25 DIAGNOSIS — F39 Unspecified mood [affective] disorder: Secondary | ICD-10-CM | POA: Diagnosis not present

## 2020-01-26 ENCOUNTER — Telehealth: Payer: Self-pay | Admitting: *Deleted

## 2020-01-26 NOTE — Telephone Encounter (Signed)
Patient called for clarification of RPR test results.  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  She has never had syphilis in the past.  She is scheduled to follow up 5/25 with Dr Johnnye Sima.  Landis Gandy, RN

## 2020-01-27 NOTE — Telephone Encounter (Signed)
Thank you :)

## 2020-02-02 ENCOUNTER — Encounter: Payer: Self-pay | Admitting: Infectious Diseases

## 2020-02-02 ENCOUNTER — Other Ambulatory Visit: Payer: Self-pay

## 2020-02-02 ENCOUNTER — Ambulatory Visit (INDEPENDENT_AMBULATORY_CARE_PROVIDER_SITE_OTHER): Payer: Medicare HMO | Admitting: Infectious Diseases

## 2020-02-02 VITALS — BP 139/80 | HR 91 | Temp 99.1°F | Wt 176.0 lb

## 2020-02-02 DIAGNOSIS — B20 Human immunodeficiency virus [HIV] disease: Secondary | ICD-10-CM | POA: Diagnosis not present

## 2020-02-02 DIAGNOSIS — Z79899 Other long term (current) drug therapy: Secondary | ICD-10-CM | POA: Diagnosis not present

## 2020-02-02 DIAGNOSIS — E669 Obesity, unspecified: Secondary | ICD-10-CM

## 2020-02-02 DIAGNOSIS — M2021 Hallux rigidus, right foot: Secondary | ICD-10-CM

## 2020-02-02 DIAGNOSIS — D249 Benign neoplasm of unspecified breast: Secondary | ICD-10-CM | POA: Diagnosis not present

## 2020-02-02 DIAGNOSIS — Z113 Encounter for screening for infections with a predominantly sexual mode of transmission: Secondary | ICD-10-CM | POA: Diagnosis not present

## 2020-02-02 NOTE — Assessment & Plan Note (Signed)
Most recent mammo is normal.

## 2020-02-02 NOTE — Assessment & Plan Note (Signed)
She is doing well Husband is on PREP.  Given condoms Encouraged her to get COVID vaccine.  Appreciate NP Dixon f/u mammo/pap Explained her labs to her RPR is false+. She has no new partners, no lesions.

## 2020-02-02 NOTE — Assessment & Plan Note (Signed)
Encouraged exercise and diet.  She is aware.

## 2020-02-02 NOTE — Progress Notes (Signed)
   Subjective:    Patient ID: Danielle [name], female    DOB: [birth year], 49 y.o.   MRN: [id number]  HPI 49yo FHIV+ and history of GERD(and surgery), bipolar, R hand ganglion cyst removed 08-2016,and abnormal uterine bleed and hysterectomy.  Patient was previously onreyetaz/norvir/truvadathen swtiched to tivicay-descovy-->biktarvy.   Has metal plate and 6 screws on her R big toe since surgery (07-23-19).  She still has pain from this.    PAP- last 05-26-19 (-) next 2023. Mammo (06-24-19) nl   Husband is on truvada, is (-).HIV (-).Mother-in-law recently died 04-03-2019).  37 yo daughter has moved out/away. Son graduated, at [employer]. Youngest daughter investigating spine surgery.     Review of Systems  Constitutional: Negative for appetite change and unexpected weight change.  Respiratory: Negative for shortness of breath.   Gastrointestinal: Negative for constipation and diarrhea.  Genitourinary: Negative for difficulty urinating.  Please see HPI. All other systems reviewed and negative.     Objective:   Physical Exam Vitals reviewed.  HENT:     Mouth/Throat:     Mouth: Mucous membranes are moist.     Pharynx: No oropharyngeal exudate.  Eyes:     Extraocular Movements: Extraocular movements intact.     Pupils: Pupils are equal, round, and reactive to light.  Cardiovascular:     Rate and Rhythm: Normal rate and regular rhythm.     Pulses:          Dorsalis pedis pulses are 3+ on the right side and 3+ on the left side.  Pulmonary:     Effort: Pulmonary effort is normal.     Breath sounds: Normal breath sounds.  Abdominal:     General: Bowel sounds are normal. There is no distension.     Palpations: Abdomen is soft.     Tenderness: There is no abdominal tenderness.  Musculoskeletal:     Cervical back: Normal range of motion.     Right lower leg: No edema.     Left lower leg: No edema.       Legs:  Lymphadenopathy:     Cervical: No cervical adenopathy.    Neurological:     General: No focal deficit present.     Mental Status: She is alert.  Psychiatric:        Mood and Affect: Mood normal.           Assessment & Plan:

## 2020-02-02 NOTE — Assessment & Plan Note (Signed)
Still some pain from surgery.  Asked her to f/u with Dr Sharol Given

## 2020-02-09 ENCOUNTER — Ambulatory Visit
Admission: RE | Admit: 2020-02-09 | Discharge: 2020-02-09 | Disposition: A | Discharge status: Home / Self Care | Payer: Medicare HMO | Source: Ambulatory Visit | Attending: Nurse Practitioner | Admitting: Nurse Practitioner

## 2020-02-09 ENCOUNTER — Other Ambulatory Visit: Payer: Self-pay | Admitting: Nurse Practitioner

## 2020-02-09 ENCOUNTER — Other Ambulatory Visit: Payer: Self-pay

## 2020-02-09 DIAGNOSIS — M25531 Pain in right wrist: Secondary | ICD-10-CM | POA: Diagnosis not present

## 2020-02-09 DIAGNOSIS — B9735 Human immunodeficiency virus, type 2 [HIV 2] as the cause of diseases classified elsewhere: Secondary | ICD-10-CM | POA: Diagnosis not present

## 2020-02-17 DIAGNOSIS — F39 Unspecified mood [affective] disorder: Secondary | ICD-10-CM | POA: Diagnosis not present

## 2020-02-18 DIAGNOSIS — R200 Anesthesia of skin: Secondary | ICD-10-CM | POA: Diagnosis not present

## 2020-02-23 DIAGNOSIS — H5213 Myopia, bilateral: Secondary | ICD-10-CM | POA: Diagnosis not present

## 2020-03-02 DIAGNOSIS — F3011 Manic episode without psychotic symptoms, mild: Secondary | ICD-10-CM | POA: Diagnosis not present

## 2020-03-07 DIAGNOSIS — F411 Generalized anxiety disorder: Secondary | ICD-10-CM | POA: Diagnosis not present

## 2020-03-07 DIAGNOSIS — F331 Major depressive disorder, recurrent, moderate: Secondary | ICD-10-CM | POA: Diagnosis not present

## 2020-03-08 DIAGNOSIS — S29001A Unspecified injury of muscle and tendon of front wall of thorax, initial encounter: Secondary | ICD-10-CM | POA: Diagnosis not present

## 2020-03-09 DIAGNOSIS — Z21 Asymptomatic human immunodeficiency virus [HIV] infection status: Secondary | ICD-10-CM | POA: Diagnosis not present

## 2020-03-09 DIAGNOSIS — E7849 Other hyperlipidemia: Secondary | ICD-10-CM | POA: Diagnosis not present

## 2020-03-09 DIAGNOSIS — B9735 Human immunodeficiency virus, type 2 [HIV 2] as the cause of diseases classified elsewhere: Secondary | ICD-10-CM | POA: Diagnosis not present

## 2020-03-14 ENCOUNTER — Other Ambulatory Visit: Payer: Self-pay | Admitting: Infectious Diseases

## 2020-03-14 DIAGNOSIS — K219 Gastro-esophageal reflux disease without esophagitis: Secondary | ICD-10-CM

## 2020-03-22 DIAGNOSIS — R200 Anesthesia of skin: Secondary | ICD-10-CM | POA: Diagnosis not present

## 2020-03-22 DIAGNOSIS — M25531 Pain in right wrist: Secondary | ICD-10-CM | POA: Diagnosis not present

## 2020-03-24 DIAGNOSIS — G43719 Chronic migraine without aura, intractable, without status migrainosus: Secondary | ICD-10-CM | POA: Diagnosis not present

## 2020-03-28 DIAGNOSIS — F331 Major depressive disorder, recurrent, moderate: Secondary | ICD-10-CM | POA: Diagnosis not present

## 2020-04-08 DIAGNOSIS — E7849 Other hyperlipidemia: Secondary | ICD-10-CM | POA: Diagnosis not present

## 2020-04-08 DIAGNOSIS — Z21 Asymptomatic human immunodeficiency virus [HIV] infection status: Secondary | ICD-10-CM | POA: Diagnosis not present

## 2020-04-08 DIAGNOSIS — B9735 Human immunodeficiency virus, type 2 [HIV 2] as the cause of diseases classified elsewhere: Secondary | ICD-10-CM | POA: Diagnosis not present

## 2020-04-11 DIAGNOSIS — Z0000 Encounter for general adult medical examination without abnormal findings: Secondary | ICD-10-CM | POA: Diagnosis not present

## 2020-04-11 DIAGNOSIS — B9735 Human immunodeficiency virus, type 2 [HIV 2] as the cause of diseases classified elsewhere: Secondary | ICD-10-CM | POA: Diagnosis not present

## 2020-04-11 DIAGNOSIS — L989 Disorder of the skin and subcutaneous tissue, unspecified: Secondary | ICD-10-CM | POA: Diagnosis not present

## 2020-04-11 DIAGNOSIS — R1012 Left upper quadrant pain: Secondary | ICD-10-CM | POA: Diagnosis not present

## 2020-04-11 DIAGNOSIS — R7303 Prediabetes: Secondary | ICD-10-CM | POA: Diagnosis not present

## 2020-04-11 DIAGNOSIS — Z7189 Other specified counseling: Secondary | ICD-10-CM | POA: Diagnosis not present

## 2020-04-13 DIAGNOSIS — F331 Major depressive disorder, recurrent, moderate: Secondary | ICD-10-CM | POA: Diagnosis not present

## 2020-04-17 ENCOUNTER — Other Ambulatory Visit: Payer: Self-pay | Admitting: Infectious Diseases

## 2020-04-17 DIAGNOSIS — B20 Human immunodeficiency virus [HIV] disease: Secondary | ICD-10-CM

## 2020-04-21 DIAGNOSIS — M25531 Pain in right wrist: Secondary | ICD-10-CM | POA: Diagnosis not present

## 2020-04-21 DIAGNOSIS — R200 Anesthesia of skin: Secondary | ICD-10-CM | POA: Diagnosis not present

## 2020-04-25 DIAGNOSIS — F39 Unspecified mood [affective] disorder: Secondary | ICD-10-CM | POA: Diagnosis not present

## 2020-05-10 DIAGNOSIS — I10 Essential (primary) hypertension: Secondary | ICD-10-CM | POA: Diagnosis not present

## 2020-05-10 DIAGNOSIS — E7849 Other hyperlipidemia: Secondary | ICD-10-CM | POA: Diagnosis not present

## 2020-05-10 DIAGNOSIS — F39 Unspecified mood [affective] disorder: Secondary | ICD-10-CM | POA: Diagnosis not present

## 2020-05-10 DIAGNOSIS — G43009 Migraine without aura, not intractable, without status migrainosus: Secondary | ICD-10-CM | POA: Diagnosis not present

## 2020-05-24 DIAGNOSIS — F331 Major depressive disorder, recurrent, moderate: Secondary | ICD-10-CM | POA: Diagnosis not present

## 2020-05-31 DIAGNOSIS — R350 Frequency of micturition: Secondary | ICD-10-CM | POA: Diagnosis not present

## 2020-05-31 DIAGNOSIS — N3941 Urge incontinence: Secondary | ICD-10-CM | POA: Diagnosis not present

## 2020-06-06 DIAGNOSIS — Z7189 Other specified counseling: Secondary | ICD-10-CM | POA: Diagnosis not present

## 2020-06-06 DIAGNOSIS — Z21 Asymptomatic human immunodeficiency virus [HIV] infection status: Secondary | ICD-10-CM | POA: Diagnosis not present

## 2020-06-06 DIAGNOSIS — L989 Disorder of the skin and subcutaneous tissue, unspecified: Secondary | ICD-10-CM | POA: Diagnosis not present

## 2020-06-06 DIAGNOSIS — R7303 Prediabetes: Secondary | ICD-10-CM | POA: Diagnosis not present

## 2020-06-06 DIAGNOSIS — M545 Low back pain: Secondary | ICD-10-CM | POA: Diagnosis not present

## 2020-06-07 DIAGNOSIS — F331 Major depressive disorder, recurrent, moderate: Secondary | ICD-10-CM | POA: Diagnosis not present

## 2020-06-09 DIAGNOSIS — I10 Essential (primary) hypertension: Secondary | ICD-10-CM | POA: Diagnosis not present

## 2020-06-09 DIAGNOSIS — G43009 Migraine without aura, not intractable, without status migrainosus: Secondary | ICD-10-CM | POA: Diagnosis not present

## 2020-06-09 DIAGNOSIS — E7849 Other hyperlipidemia: Secondary | ICD-10-CM | POA: Diagnosis not present

## 2020-06-14 DIAGNOSIS — F331 Major depressive disorder, recurrent, moderate: Secondary | ICD-10-CM | POA: Diagnosis not present

## 2020-06-23 ENCOUNTER — Ambulatory Visit
Admission: RE | Admit: 2020-06-23 | Discharge: 2020-06-23 | Disposition: A | Discharge status: Home / Self Care | Payer: Medicare HMO | Source: Ambulatory Visit | Attending: Nurse Practitioner | Admitting: Nurse Practitioner

## 2020-06-23 ENCOUNTER — Other Ambulatory Visit: Payer: Self-pay | Admitting: Nurse Practitioner

## 2020-06-23 DIAGNOSIS — M545 Low back pain, unspecified: Secondary | ICD-10-CM | POA: Diagnosis not present

## 2020-06-23 DIAGNOSIS — Z7189 Other specified counseling: Secondary | ICD-10-CM | POA: Diagnosis not present

## 2020-06-23 DIAGNOSIS — G43719 Chronic migraine without aura, intractable, without status migrainosus: Secondary | ICD-10-CM | POA: Diagnosis not present

## 2020-07-07 DIAGNOSIS — M545 Low back pain, unspecified: Secondary | ICD-10-CM | POA: Diagnosis not present

## 2020-07-07 DIAGNOSIS — Z7189 Other specified counseling: Secondary | ICD-10-CM | POA: Diagnosis not present

## 2020-07-07 DIAGNOSIS — M87351 Other secondary osteonecrosis, right femur: Secondary | ICD-10-CM | POA: Diagnosis not present

## 2020-07-07 DIAGNOSIS — M87352 Other secondary osteonecrosis, left femur: Secondary | ICD-10-CM | POA: Diagnosis not present

## 2020-07-08 ENCOUNTER — Other Ambulatory Visit: Payer: Self-pay | Admitting: Nurse Practitioner

## 2020-07-08 ENCOUNTER — Ambulatory Visit
Admission: RE | Admit: 2020-07-08 | Discharge: 2020-07-08 | Disposition: A | Discharge status: Home / Self Care | Payer: Medicare HMO | Source: Ambulatory Visit | Attending: Nurse Practitioner | Admitting: Nurse Practitioner

## 2020-07-08 DIAGNOSIS — M25552 Pain in left hip: Secondary | ICD-10-CM | POA: Diagnosis not present

## 2020-07-08 DIAGNOSIS — M545 Low back pain, unspecified: Secondary | ICD-10-CM | POA: Diagnosis not present

## 2020-07-08 DIAGNOSIS — M87351 Other secondary osteonecrosis, right femur: Secondary | ICD-10-CM

## 2020-07-08 DIAGNOSIS — M25551 Pain in right hip: Secondary | ICD-10-CM | POA: Diagnosis not present

## 2020-07-08 DIAGNOSIS — M87352 Other secondary osteonecrosis, left femur: Secondary | ICD-10-CM

## 2020-07-12 DIAGNOSIS — F331 Major depressive disorder, recurrent, moderate: Secondary | ICD-10-CM | POA: Diagnosis not present

## 2020-07-20 DIAGNOSIS — M25551 Pain in right hip: Secondary | ICD-10-CM | POA: Diagnosis not present

## 2020-07-20 DIAGNOSIS — M25552 Pain in left hip: Secondary | ICD-10-CM | POA: Diagnosis not present

## 2020-07-20 DIAGNOSIS — M545 Low back pain, unspecified: Secondary | ICD-10-CM | POA: Diagnosis not present

## 2020-07-27 DIAGNOSIS — F331 Major depressive disorder, recurrent, moderate: Secondary | ICD-10-CM | POA: Diagnosis not present

## 2020-07-27 DIAGNOSIS — R350 Frequency of micturition: Secondary | ICD-10-CM | POA: Diagnosis not present

## 2020-07-27 DIAGNOSIS — F411 Generalized anxiety disorder: Secondary | ICD-10-CM | POA: Diagnosis not present

## 2020-07-27 DIAGNOSIS — N3941 Urge incontinence: Secondary | ICD-10-CM | POA: Diagnosis not present

## 2020-07-28 DIAGNOSIS — M545 Low back pain, unspecified: Secondary | ICD-10-CM | POA: Diagnosis not present

## 2020-07-28 DIAGNOSIS — M25559 Pain in unspecified hip: Secondary | ICD-10-CM | POA: Diagnosis not present

## 2020-08-01 DIAGNOSIS — M25551 Pain in right hip: Secondary | ICD-10-CM | POA: Diagnosis not present

## 2020-08-03 ENCOUNTER — Other Ambulatory Visit: Payer: Self-pay | Admitting: Infectious Diseases

## 2020-08-03 DIAGNOSIS — F411 Generalized anxiety disorder: Secondary | ICD-10-CM | POA: Diagnosis not present

## 2020-08-03 DIAGNOSIS — F331 Major depressive disorder, recurrent, moderate: Secondary | ICD-10-CM | POA: Diagnosis not present

## 2020-08-03 DIAGNOSIS — B009 Herpesviral infection, unspecified: Secondary | ICD-10-CM

## 2020-08-08 DIAGNOSIS — M87351 Other secondary osteonecrosis, right femur: Secondary | ICD-10-CM | POA: Diagnosis not present

## 2020-08-08 DIAGNOSIS — M87352 Other secondary osteonecrosis, left femur: Secondary | ICD-10-CM | POA: Diagnosis not present

## 2020-08-08 DIAGNOSIS — M5459 Other low back pain: Secondary | ICD-10-CM | POA: Diagnosis not present

## 2020-08-09 DIAGNOSIS — F331 Major depressive disorder, recurrent, moderate: Secondary | ICD-10-CM | POA: Diagnosis not present

## 2020-08-17 DIAGNOSIS — F39 Unspecified mood [affective] disorder: Secondary | ICD-10-CM | POA: Diagnosis not present

## 2020-08-22 DIAGNOSIS — F39 Unspecified mood [affective] disorder: Secondary | ICD-10-CM | POA: Diagnosis not present

## 2020-08-30 ENCOUNTER — Other Ambulatory Visit: Payer: Self-pay | Admitting: Infectious Diseases

## 2020-08-30 DIAGNOSIS — Z1231 Encounter for screening mammogram for malignant neoplasm of breast: Secondary | ICD-10-CM

## 2020-08-30 DIAGNOSIS — F331 Major depressive disorder, recurrent, moderate: Secondary | ICD-10-CM | POA: Diagnosis not present

## 2020-08-30 DIAGNOSIS — F411 Generalized anxiety disorder: Secondary | ICD-10-CM | POA: Diagnosis not present

## 2020-09-07 DIAGNOSIS — M25551 Pain in right hip: Secondary | ICD-10-CM | POA: Diagnosis not present

## 2020-09-09 DIAGNOSIS — G43009 Migraine without aura, not intractable, without status migrainosus: Secondary | ICD-10-CM | POA: Diagnosis not present

## 2020-09-09 DIAGNOSIS — E7849 Other hyperlipidemia: Secondary | ICD-10-CM | POA: Diagnosis not present

## 2020-09-09 DIAGNOSIS — I10 Essential (primary) hypertension: Secondary | ICD-10-CM | POA: Diagnosis not present

## 2020-09-27 ENCOUNTER — Other Ambulatory Visit: Payer: Medicare HMO

## 2020-09-27 ENCOUNTER — Other Ambulatory Visit (HOSPITAL_COMMUNITY)
Admission: RE | Admit: 2020-09-27 | Discharge: 2020-09-27 | Disposition: A | Discharge status: Home / Self Care | Payer: Medicare HMO | Source: Ambulatory Visit | Attending: Infectious Diseases | Admitting: Infectious Diseases

## 2020-09-27 ENCOUNTER — Other Ambulatory Visit: Payer: Self-pay

## 2020-09-27 DIAGNOSIS — Z113 Encounter for screening for infections with a predominantly sexual mode of transmission: Secondary | ICD-10-CM

## 2020-09-27 DIAGNOSIS — B20 Human immunodeficiency virus [HIV] disease: Secondary | ICD-10-CM

## 2020-09-27 DIAGNOSIS — Z79899 Other long term (current) drug therapy: Secondary | ICD-10-CM | POA: Diagnosis not present

## 2020-09-28 ENCOUNTER — Other Ambulatory Visit: Payer: Self-pay

## 2020-09-28 DIAGNOSIS — B20 Human immunodeficiency virus [HIV] disease: Secondary | ICD-10-CM

## 2020-09-28 LAB — URINE CYTOLOGY ANCILLARY ONLY
Chlamydia: NEGATIVE
Comment: NEGATIVE
Comment: NORMAL
Neisseria Gonorrhea: NEGATIVE

## 2020-09-28 LAB — T-HELPER CELL (CD4) - (RCID CLINIC ONLY)
CD4 % Helper T Cell: 39 % (ref 33–65)
CD4 T Cell Abs: 904 /uL (ref 400–1790)

## 2020-09-29 ENCOUNTER — Other Ambulatory Visit: Payer: Medicare HMO

## 2020-09-29 ENCOUNTER — Other Ambulatory Visit: Payer: Self-pay

## 2020-09-29 DIAGNOSIS — B20 Human immunodeficiency virus [HIV] disease: Secondary | ICD-10-CM | POA: Diagnosis not present

## 2020-09-29 LAB — COMPREHENSIVE METABOLIC PANEL
AG Ratio: 1.6 (calc) (ref 1.0–2.5)
AG Ratio: 1.6 (calc) (ref 1.0–2.5)
ALT: 11 U/L (ref 6–29)
ALT: 14 U/L (ref 6–29)
AST: 13 U/L (ref 10–35)
AST: 17 U/L (ref 10–35)
Albumin: 4.2 g/dL (ref 3.6–5.1)
Albumin: 4.1 g/dL (ref 3.6–5.1)
Alkaline phosphatase (APISO): 99 U/L (ref 31–125)
Alkaline phosphatase (APISO): 99 U/L (ref 31–125)
BUN/Creatinine Ratio: 9 (calc) (ref 6–22)
BUN: 11 mg/dL (ref 7–25)
BUN: 12 mg/dL (ref 7–25)
CO2: 25 mmol/L (ref 20–32)
CO2: 26 mmol/L (ref 20–32)
Calcium: 9.3 mg/dL (ref 8.6–10.2)
Calcium: 9.4 mg/dL (ref 8.6–10.2)
Chloride: 108 mmol/L (ref 98–110)
Chloride: 109 mmol/L (ref 98–110)
Creat: 1.16 mg/dL — ABNORMAL HIGH (ref 0.50–1.10)
Creat: 1.08 mg/dL (ref 0.50–1.10)
Globulin: 2.7 g/dL (calc) (ref 1.9–3.7)
Globulin: 2.5 g/dL (calc) (ref 1.9–3.7)
Glucose, Bld: 90 mg/dL (ref 65–99)
Glucose, Bld: 89 mg/dL (ref 65–99)
Potassium: 3.3 mmol/L — ABNORMAL LOW (ref 3.5–5.3)
Potassium: 3.9 mmol/L (ref 3.5–5.3)
Sodium: 139 mmol/L (ref 135–146)
Sodium: 140 mmol/L (ref 135–146)
Total Bilirubin: 0.4 mg/dL (ref 0.2–1.2)
Total Bilirubin: 0.4 mg/dL (ref 0.2–1.2)
Total Protein: 6.9 g/dL (ref 6.1–8.1)
Total Protein: 6.6 g/dL (ref 6.1–8.1)

## 2020-09-29 LAB — CBC
HCT: 39 % (ref 35.0–45.0)
Hemoglobin: 12.8 g/dL (ref 11.7–15.5)
MCH: 29.6 pg (ref 27.0–33.0)
MCHC: 32.8 g/dL (ref 32.0–36.0)
MCV: 90.3 fL (ref 80.0–100.0)
MPV: 11.4 fL (ref 7.5–12.5)
Platelets: 244 10*3/uL (ref 140–400)
RBC: 4.32 10*6/uL (ref 3.80–5.10)
RDW: 13.2 % (ref 11.0–15.0)
WBC: 5.1 10*3/uL (ref 3.8–10.8)

## 2020-09-29 LAB — LIPID PANEL
Cholesterol: 136 mg/dL (ref <200)
HDL: 50 mg/dL (ref >=50)
LDL Cholesterol (Calc): 66 mg/dL (calc)
Non-HDL Cholesterol (Calc): 86 mg/dL (calc) (ref <130)
Total CHOL/HDL Ratio: 2.7 (calc) (ref <5.0)
Triglycerides: 118 mg/dL (ref <150)

## 2020-09-29 LAB — RPR: RPR Ser Ql: NONREACTIVE

## 2020-09-29 LAB — HIV-1 RNA QUANT-NO REFLEX-BLD
HIV 1 RNA Quant: <20 Copies/mL
HIV-1 RNA Quant, Log: <1.3 Log cps/mL

## 2020-10-05 DIAGNOSIS — M4727 Other spondylosis with radiculopathy, lumbosacral region: Secondary | ICD-10-CM | POA: Diagnosis not present

## 2020-10-05 DIAGNOSIS — M545 Low back pain, unspecified: Secondary | ICD-10-CM | POA: Diagnosis not present

## 2020-10-05 DIAGNOSIS — Z79899 Other long term (current) drug therapy: Secondary | ICD-10-CM | POA: Diagnosis not present

## 2020-10-05 DIAGNOSIS — M5117 Intervertebral disc disorders with radiculopathy, lumbosacral region: Secondary | ICD-10-CM | POA: Diagnosis not present

## 2020-10-05 DIAGNOSIS — Z79891 Long term (current) use of opiate analgesic: Secondary | ICD-10-CM | POA: Diagnosis not present

## 2020-10-05 DIAGNOSIS — G894 Chronic pain syndrome: Secondary | ICD-10-CM | POA: Diagnosis not present

## 2020-10-07 DIAGNOSIS — D2362 Other benign neoplasm of skin of left upper limb, including shoulder: Secondary | ICD-10-CM | POA: Diagnosis not present

## 2020-10-07 DIAGNOSIS — D2372 Other benign neoplasm of skin of left lower limb, including hip: Secondary | ICD-10-CM | POA: Diagnosis not present

## 2020-10-07 DIAGNOSIS — D2371 Other benign neoplasm of skin of right lower limb, including hip: Secondary | ICD-10-CM | POA: Diagnosis not present

## 2020-10-07 DIAGNOSIS — D2361 Other benign neoplasm of skin of right upper limb, including shoulder: Secondary | ICD-10-CM | POA: Diagnosis not present

## 2020-10-08 DIAGNOSIS — G43009 Migraine without aura, not intractable, without status migrainosus: Secondary | ICD-10-CM | POA: Diagnosis not present

## 2020-10-08 DIAGNOSIS — E7849 Other hyperlipidemia: Secondary | ICD-10-CM | POA: Diagnosis not present

## 2020-10-08 DIAGNOSIS — I10 Essential (primary) hypertension: Secondary | ICD-10-CM | POA: Diagnosis not present

## 2020-10-10 ENCOUNTER — Other Ambulatory Visit: Payer: Self-pay

## 2020-10-10 ENCOUNTER — Ambulatory Visit
Admission: RE | Admit: 2020-10-10 | Discharge: 2020-10-10 | Disposition: A | Discharge status: Home / Self Care | Payer: Medicare HMO | Source: Ambulatory Visit | Attending: Infectious Diseases | Admitting: Infectious Diseases

## 2020-10-10 DIAGNOSIS — Z1231 Encounter for screening mammogram for malignant neoplasm of breast: Secondary | ICD-10-CM

## 2020-10-11 ENCOUNTER — Encounter: Payer: Self-pay | Admitting: Infectious Diseases

## 2020-10-11 ENCOUNTER — Ambulatory Visit (INDEPENDENT_AMBULATORY_CARE_PROVIDER_SITE_OTHER): Payer: Medicare HMO | Admitting: Infectious Diseases

## 2020-10-11 ENCOUNTER — Other Ambulatory Visit: Payer: Self-pay

## 2020-10-11 VITALS — BP 133/83 | HR 69 | Temp 98.7°F | Wt 169.0 lb

## 2020-10-11 DIAGNOSIS — Z79899 Other long term (current) drug therapy: Secondary | ICD-10-CM

## 2020-10-11 DIAGNOSIS — F411 Generalized anxiety disorder: Secondary | ICD-10-CM

## 2020-10-11 DIAGNOSIS — Z23 Encounter for immunization: Secondary | ICD-10-CM | POA: Diagnosis not present

## 2020-10-11 DIAGNOSIS — D229 Melanocytic nevi, unspecified: Secondary | ICD-10-CM

## 2020-10-11 DIAGNOSIS — E669 Obesity, unspecified: Secondary | ICD-10-CM | POA: Diagnosis not present

## 2020-10-11 DIAGNOSIS — B20 Human immunodeficiency virus [HIV] disease: Secondary | ICD-10-CM | POA: Diagnosis not present

## 2020-10-11 DIAGNOSIS — Z113 Encounter for screening for infections with a predominantly sexual mode of transmission: Secondary | ICD-10-CM

## 2020-10-11 DIAGNOSIS — N939 Abnormal uterine and vaginal bleeding, unspecified: Secondary | ICD-10-CM | POA: Diagnosis not present

## 2020-10-11 NOTE — Assessment & Plan Note (Signed)
Trying to lose wt. Watching diet.  Not exercising because of her foot.  Encouraged her to do upper body/arm exercises.

## 2020-10-11 NOTE — Assessment & Plan Note (Signed)
She has been seen by derm Was told they are benign.

## 2020-10-11 NOTE — Assessment & Plan Note (Signed)
Danielle [name] has retired.  She has been doing well, "i'm fine, I don't need to see nobody"

## 2020-10-11 NOTE — Progress Notes (Signed)
   Subjective:    Patient ID: Danielle [name], female  DOB: 1971/08/20, 50 y.o.        MRN: [id number]   HPI 50yo FHIV+ andhistory of GERD(and surgery), bipolar, R hand ganglion cyst removed 08-2016,and abnormal uterine bleed and hysterectomy.  Patient was previously onreyetaz/norvir/truvadathen swtiched to tivicay-descovy-->biktarvy. No problems with ART.   Has metal plate and 6 screws on her R big toe since surgery (07-23-19).  She still has pain. Is not able to wear shoes.   PAP- last 05-26-19(-) next 2023. Mammo (09-2020) nl   Husband (drives truck) is on truvada--> descovey, is (-).HIV (-).Mother-in-law recently died 2019-02-20).  21yo daughter has moved out/away. Son graduated, working at [employer] as mgr. Oldest daughter works at [employer] as Pharmacist, hospital.   HIV 1 RNA Quant  Date Value  09/27/2020 <20 Copies/mL  01/18/2020 <20 NOT DETECTED copies/mL  03/31/2019 <20 NOT DETECTED copies/mL   CD4 T Cell Abs (/uL)  Date Value  09/27/2020 904  01/18/2020 800  03/31/2019 935     Health Maintenance  Topic Date Due  . INFLUENZA VACCINE  04/10/2020  . COVID-19 Vaccine (3 - Pfizer risk 4-dose series) 04/15/2020  . PAP SMEAR-Modifier  05/25/2022  . COLONOSCOPY (Pts 45-35yrs Insurance coverage will need to be confirmed)  10/13/2023  . TETANUS/TDAP  05/28/2024  . Hepatitis C Screening  Completed  . HIV Screening  Completed  mammo nl 09-2020    Review of Systems  Constitutional: Negative for chills, fever and weight loss.  Respiratory: Negative for cough and shortness of breath.   Cardiovascular: Negative for chest pain.  Gastrointestinal: Negative for constipation and diarrhea.  Genitourinary: Negative for dysuria.  Psychiatric/Behavioral: The patient does not have insomnia.    Please see HPI. All other systems reviewed and negative.     Objective:  Physical Exam Vitals reviewed.  Constitutional:      Appearance: Normal appearance.  HENT:      Mouth/Throat:     Pharynx: Oropharynx is clear. No oropharyngeal exudate.  Eyes:     Pupils: Pupils are equal, round, and reactive to light.  Cardiovascular:     Rate and Rhythm: Normal rate and regular rhythm.  Pulmonary:     Effort: Pulmonary effort is normal.     Breath sounds: Normal breath sounds.  Abdominal:     General: Bowel sounds are normal. There is no distension.     Palpations: Abdomen is soft.     Tenderness: There is no abdominal tenderness.  Musculoskeletal:     Cervical back: Normal range of motion and neck supple.     Right lower leg: No edema.     Left lower leg: No edema.       Feet:  Neurological:     General: No focal deficit present.     Mental Status: She is alert.  Psychiatric:        Mood and Affect: Mood normal.            Assessment & Plan:

## 2020-10-11 NOTE — Assessment & Plan Note (Signed)
She is doing well Will continue her current art (biktarvy) COVID vax done Flu vax offered.  Husband on prep.  Pap 2023 Needs PCV 13 but she defers rtc in 9 months.

## 2020-10-11 NOTE — Assessment & Plan Note (Signed)
S/p hyst.  Resolved.

## 2020-10-12 DIAGNOSIS — G43719 Chronic migraine without aura, intractable, without status migrainosus: Secondary | ICD-10-CM | POA: Diagnosis not present

## 2020-10-13 ENCOUNTER — Other Ambulatory Visit: Payer: Self-pay | Admitting: Infectious Diseases

## 2020-10-13 DIAGNOSIS — K219 Gastro-esophageal reflux disease without esophagitis: Secondary | ICD-10-CM

## 2020-10-24 ENCOUNTER — Other Ambulatory Visit: Payer: Self-pay | Admitting: Infectious Diseases

## 2020-10-24 DIAGNOSIS — B20 Human immunodeficiency virus [HIV] disease: Secondary | ICD-10-CM

## 2020-10-24 DIAGNOSIS — M47817 Spondylosis without myelopathy or radiculopathy, lumbosacral region: Secondary | ICD-10-CM | POA: Diagnosis not present

## 2020-10-27 DIAGNOSIS — N3941 Urge incontinence: Secondary | ICD-10-CM | POA: Diagnosis not present

## 2020-10-27 DIAGNOSIS — N3010 Interstitial cystitis (chronic) without hematuria: Secondary | ICD-10-CM | POA: Diagnosis not present

## 2020-11-07 DIAGNOSIS — I10 Essential (primary) hypertension: Secondary | ICD-10-CM | POA: Diagnosis not present

## 2020-11-07 DIAGNOSIS — E7849 Other hyperlipidemia: Secondary | ICD-10-CM | POA: Diagnosis not present

## 2020-11-15 DIAGNOSIS — R7303 Prediabetes: Secondary | ICD-10-CM | POA: Diagnosis not present

## 2020-11-15 DIAGNOSIS — M545 Low back pain, unspecified: Secondary | ICD-10-CM | POA: Diagnosis not present

## 2020-11-15 DIAGNOSIS — Z7189 Other specified counseling: Secondary | ICD-10-CM | POA: Diagnosis not present

## 2020-11-15 DIAGNOSIS — Z21 Asymptomatic human immunodeficiency virus [HIV] infection status: Secondary | ICD-10-CM | POA: Diagnosis not present

## 2020-11-15 DIAGNOSIS — I10 Essential (primary) hypertension: Secondary | ICD-10-CM | POA: Diagnosis not present

## 2020-11-15 DIAGNOSIS — E782 Mixed hyperlipidemia: Secondary | ICD-10-CM | POA: Diagnosis not present

## 2020-11-28 DIAGNOSIS — M545 Low back pain, unspecified: Secondary | ICD-10-CM | POA: Diagnosis not present

## 2020-11-28 DIAGNOSIS — M5117 Intervertebral disc disorders with radiculopathy, lumbosacral region: Secondary | ICD-10-CM | POA: Diagnosis not present

## 2020-11-28 DIAGNOSIS — G894 Chronic pain syndrome: Secondary | ICD-10-CM | POA: Diagnosis not present

## 2020-11-28 DIAGNOSIS — M47817 Spondylosis without myelopathy or radiculopathy, lumbosacral region: Secondary | ICD-10-CM | POA: Diagnosis not present

## 2020-12-08 DIAGNOSIS — G43009 Migraine without aura, not intractable, without status migrainosus: Secondary | ICD-10-CM | POA: Diagnosis not present

## 2020-12-08 DIAGNOSIS — I10 Essential (primary) hypertension: Secondary | ICD-10-CM | POA: Diagnosis not present

## 2020-12-08 DIAGNOSIS — E7849 Other hyperlipidemia: Secondary | ICD-10-CM | POA: Diagnosis not present

## 2020-12-20 ENCOUNTER — Ambulatory Visit (INDEPENDENT_AMBULATORY_CARE_PROVIDER_SITE_OTHER): Payer: Medicare HMO

## 2020-12-20 ENCOUNTER — Other Ambulatory Visit: Payer: Self-pay

## 2020-12-20 ENCOUNTER — Encounter (HOSPITAL_COMMUNITY): Payer: Self-pay | Admitting: Emergency Medicine

## 2020-12-20 ENCOUNTER — Ambulatory Visit (HOSPITAL_COMMUNITY)
Admission: EM | Admit: 2020-12-20 | Discharge: 2020-12-20 | Disposition: A | Discharge status: Home / Self Care | Payer: Medicare HMO

## 2020-12-20 DIAGNOSIS — S60211A Contusion of right wrist, initial encounter: Secondary | ICD-10-CM | POA: Diagnosis not present

## 2020-12-20 DIAGNOSIS — Z9889 Other specified postprocedural states: Secondary | ICD-10-CM

## 2020-12-20 DIAGNOSIS — G894 Chronic pain syndrome: Secondary | ICD-10-CM | POA: Diagnosis not present

## 2020-12-20 DIAGNOSIS — M25571 Pain in right ankle and joints of right foot: Secondary | ICD-10-CM

## 2020-12-20 DIAGNOSIS — R7303 Prediabetes: Secondary | ICD-10-CM

## 2020-12-20 DIAGNOSIS — M7989 Other specified soft tissue disorders: Secondary | ICD-10-CM

## 2020-12-20 DIAGNOSIS — M25431 Effusion, right wrist: Secondary | ICD-10-CM | POA: Diagnosis not present

## 2020-12-20 DIAGNOSIS — M545 Low back pain, unspecified: Secondary | ICD-10-CM

## 2020-12-20 DIAGNOSIS — G8929 Other chronic pain: Secondary | ICD-10-CM

## 2020-12-20 DIAGNOSIS — M549 Dorsalgia, unspecified: Secondary | ICD-10-CM | POA: Diagnosis not present

## 2020-12-20 DIAGNOSIS — M25531 Pain in right wrist: Secondary | ICD-10-CM

## 2020-12-20 DIAGNOSIS — S39012A Strain of muscle, fascia and tendon of lower back, initial encounter: Secondary | ICD-10-CM

## 2020-12-20 DIAGNOSIS — M25471 Effusion, right ankle: Secondary | ICD-10-CM

## 2020-12-20 DIAGNOSIS — M79671 Pain in right foot: Secondary | ICD-10-CM

## 2020-12-20 DIAGNOSIS — B20 Human immunodeficiency virus [HIV] disease: Secondary | ICD-10-CM | POA: Diagnosis not present

## 2020-12-20 MED ORDER — TIZANIDINE HCL 2 MG PO TABS: 2.0000 mg | ORAL_TABLET | Freq: Three times a day (TID) | ORAL | 0 refills | Status: DC | PRN

## 2020-12-20 MED ORDER — PREDNISONE 20 MG PO TABS: 40.0000 mg | ORAL_TABLET | Freq: Every day | ORAL | 0 refills | Status: AC
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
## 2020-12-20 NOTE — Discharge Instructions (Signed)
-  Prednisone 2 pills taken together for 5 days in a row.  Try to take this earlier in the day as it can give you energy. -Start the muscle relaxer-Zanaflex (tizanidine), up to 3 times daily for muscle spasms and pain.  This can make you drowsy, so take at bedtime or when you do not need to drive or operate machinery. -Follow-up with spine specialist as scheduled on Friday. -Follow-up with hand specialist if symptoms persist longer than 5 days. -Keep your ankle boot and wrist splint on as long as you are having pain.  As your pain resolves, you can try stopping these. -Seek additional medical attention if you develop worsening of symptoms, like new numbness on your inner thighs, urinary incontinence, worse back pain of your life, etc.

## 2020-12-21 ENCOUNTER — Ambulatory Visit (INDEPENDENT_AMBULATORY_CARE_PROVIDER_SITE_OTHER): Payer: Medicare HMO | Admitting: Family

## 2020-12-21 ENCOUNTER — Encounter: Payer: Self-pay | Admitting: Family

## 2020-12-21 DIAGNOSIS — M25571 Pain in right ankle and joints of right foot: Secondary | ICD-10-CM | POA: Diagnosis not present

## 2020-12-21 DIAGNOSIS — M2021 Hallux rigidus, right foot: Secondary | ICD-10-CM

## 2020-12-21 DIAGNOSIS — G8929 Other chronic pain: Secondary | ICD-10-CM
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
## 2020-12-22 DIAGNOSIS — M545 Low back pain, unspecified: Secondary | ICD-10-CM | POA: Diagnosis not present

## 2020-12-22 DIAGNOSIS — J028 Acute pharyngitis due to other specified organisms: Secondary | ICD-10-CM | POA: Diagnosis not present

## 2020-12-22 DIAGNOSIS — Z20822 Contact with and (suspected) exposure to covid-19: Secondary | ICD-10-CM | POA: Diagnosis not present

## 2020-12-22 DIAGNOSIS — Z7189 Other specified counseling: Secondary | ICD-10-CM | POA: Diagnosis not present

## 2020-12-22 DIAGNOSIS — Z20820 Contact with and (suspected) exposure to varicella: Secondary | ICD-10-CM | POA: Diagnosis not present

## 2020-12-23 DIAGNOSIS — Z79891 Long term (current) use of opiate analgesic: Secondary | ICD-10-CM | POA: Diagnosis not present

## 2020-12-23 DIAGNOSIS — M5117 Intervertebral disc disorders with radiculopathy, lumbosacral region: Secondary | ICD-10-CM | POA: Diagnosis not present

## 2020-12-23 DIAGNOSIS — G894 Chronic pain syndrome: Secondary | ICD-10-CM | POA: Diagnosis not present

## 2020-12-23 DIAGNOSIS — M47817 Spondylosis without myelopathy or radiculopathy, lumbosacral region: Secondary | ICD-10-CM | POA: Diagnosis not present

## 2020-12-28 ENCOUNTER — Other Ambulatory Visit: Payer: Self-pay | Admitting: Infectious Diseases

## 2020-12-28 DIAGNOSIS — B009 Herpesviral infection, unspecified: Secondary | ICD-10-CM

## 2021-01-04 ENCOUNTER — Ambulatory Visit (INDEPENDENT_AMBULATORY_CARE_PROVIDER_SITE_OTHER): Payer: Medicare HMO | Admitting: Physician Assistant

## 2021-01-04 ENCOUNTER — Encounter: Payer: Self-pay | Admitting: Physician Assistant

## 2021-01-04 DIAGNOSIS — G8929 Other chronic pain: Secondary | ICD-10-CM | POA: Diagnosis not present

## 2021-01-04 DIAGNOSIS — M25571 Pain in right ankle and joints of right foot: Secondary | ICD-10-CM
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
## 2021-01-10 ENCOUNTER — Other Ambulatory Visit: Payer: Self-pay

## 2021-01-10 ENCOUNTER — Encounter: Payer: Self-pay | Admitting: Physical Therapy

## 2021-01-10 ENCOUNTER — Ambulatory Visit: Payer: Medicare HMO | Attending: Physician Assistant | Admitting: Physical Therapy

## 2021-01-10 DIAGNOSIS — M6281 Muscle weakness (generalized): Secondary | ICD-10-CM | POA: Diagnosis not present

## 2021-01-10 DIAGNOSIS — R2689 Other abnormalities of gait and mobility: Secondary | ICD-10-CM | POA: Insufficient documentation

## 2021-01-10 DIAGNOSIS — M25571 Pain in right ankle and joints of right foot: Secondary | ICD-10-CM | POA: Diagnosis not present

## 2021-01-10 DIAGNOSIS — M25671 Stiffness of right ankle, not elsewhere classified: Secondary | ICD-10-CM | POA: Diagnosis not present

## 2021-01-10 NOTE — Patient Instructions (Signed)
Access Code: [email or web address] URL: [email or web address] Date: 01/10/2021 Prepared by: Hilda Blades  Exercises Long Sitting Calf Stretch with Strap - 2-3 x daily - 7 x weekly - 3 reps - 30 hold Seated Heel Toe Raises - 2-3 x daily - 7 x weekly - 2 sets - 10 reps Ankle Inversion Eversion Towel Slide - 2-3 x daily - 7 x weekly - 2 sets - 10 reps Seated Toe Towel Scrunches - 2-3 x daily - 7 x weekly - 2 sets - 10 reps
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
## 2021-01-19 ENCOUNTER — Other Ambulatory Visit: Payer: Self-pay

## 2021-01-19 ENCOUNTER — Ambulatory Visit: Payer: Medicare HMO | Admitting: Physical Therapy

## 2021-01-19 ENCOUNTER — Encounter: Payer: Self-pay | Admitting: Physical Therapy

## 2021-01-19 DIAGNOSIS — M25571 Pain in right ankle and joints of right foot: Secondary | ICD-10-CM | POA: Diagnosis not present

## 2021-01-19 DIAGNOSIS — M6281 Muscle weakness (generalized): Secondary | ICD-10-CM | POA: Diagnosis not present

## 2021-01-19 DIAGNOSIS — R2689 Other abnormalities of gait and mobility: Secondary | ICD-10-CM | POA: Diagnosis not present

## 2021-01-19 DIAGNOSIS — M25671 Stiffness of right ankle, not elsewhere classified: Secondary | ICD-10-CM

## 2021-01-19 NOTE — Therapy (Signed)
[place], [place], [address] Phone: [phone]   Fax:  [phone]  Physical Therapy Treatment  Patient Details  Name: Danielle [name] MRN: [id number] Date of Birth: 10/01/1970 Referring Provider (PT): Persons, Bevely [place], [place]   Encounter Date: 01/19/2021   PT End of Session - 01/19/21 1440    Visit Number 2    Number of Visits 6    Date for PT Re-Evaluation 02/21/21    Authorization Type [employer] MCR    Progress Note Due on Visit 10    PT Start Time 1440    PT Stop Time 1530    PT Time Calculation (min) 50 min    Activity Tolerance Patient tolerated treatment well    Behavior During Therapy [facility] for tasks assessed/performed           Past Medical History:  Diagnosis Date  . Abnormal uterine bleeding (AUB)   . Achalasia    Type 2  . Anemia   . Anxiety   . Asthma   . Bipolar disorder ([place])    Mixed  . Bulging lumbar disc   . Chronic back pain   . Condyloma acuminata   . Depression   . 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 Ganglion cyst    Right hand  . Genital HSV   . GERD (gastroesophageal reflux disease)   . Hepatitis    States she got a shot for this  . HIV infection ([place])   . Migraines   . Pneumonia   . Pre-diabetes   . Trichimoniasis   . Urinary incontinence   . Uterine fibroid   . Wears glasses     Past Surgical History:  Procedure Laterality Date  . ABDOMINAL HYSTERECTOMY N/A 06/28/2015   Procedure: HYSTERECTOMY ABDOMINAL;  Surgeon: Osborne Oman, MD;  Location: [facility];  Service: Gynecology;  Laterality: N/A;  Requested 06/28/15 @ 7:30a  . ANKLE ARTHROSCOPY Right 10/16/2017   Procedure: RIGHT ANKLE ARTHROSCOPIC DEBRIDEMENT;  Surgeon: Newt Minion, MD;  Location: [place];  Service: Orthopedics;  Laterality: Right;  . ARTHRODESIS METATARSALPHALANGEAL JOINT (MTPJ) Right 07/23/2018   Procedure: RIGHT GREAT TOE METATARSOPHALANGEAL JOINT FUSION;  Surgeon: Newt Minion, MD;   Location: [place];  Service: Orthopedics;  Laterality: Right;  . BILATERAL SALPINGECTOMY Bilateral 06/28/2015   Procedure: BILATERAL SALPINGECTOMY;  Surgeon: Osborne Oman, MD;  Location: [facility];  Service: Gynecology;  Laterality: Bilateral;  . CESAREAN SECTION     twice;  vertical incision x 2  . COLONOSCOPY N/A 10/12/2013   Procedure: COLONOSCOPY;  Surgeon: Jerene Bears, MD;  [facility];  Service: Endoscopy;  Laterality: N/A;  . CYSTO WITH HYDRODISTENSION N/A 05/27/2018   Procedure: CYSTOSCOPY/HYDRODISTENSION AND INSTILLATION;  Surgeon: Bjorn Loser, MD;  Location: [facility];  Service: Urology;  Laterality: N/A;  . ESOPHAGEAL MANOMETRY N/A 06/29/2013   Procedure: ESOPHAGEAL MANOMETRY (EM);  Surgeon: Jerene Bears, MD;  [facility];  Service: Gastroenterology;  Laterality: N/A;  . FOREIGN BODY REMOVAL Left 2018   fiberglass, left thumb  . HELLER MYOTOMY N/A 10/08/2013   Procedure: LAPAROSCOPIC HELLER MYOTOMY, DOR FUNDIPLICATION, UPPER ENDOSCOPY;  Surgeon: Odis Hollingshead, MD;  [facility];  Service: General;  Laterality: N/A;  . HERNIA REPAIR     Umbilical  . HYSTEROSCOPY WITH RESECTOSCOPE N/A 05/03/2015   Procedure: Diagnostic Hysteroscopy;  Surgeon: Lavonia Drafts, MD;  Location: [facility];  Service: Gynecology;  Laterality: N/A;  wants  Novasure and myosure.  Marland Kitchen LYSIS OF ADHESION  06/28/2015   Procedure: LYSIS OF ADHESION;  Surgeon: Osborne Oman, MD;  Location: [facility];  Service: Gynecology;;  . OTHER SURGICAL HISTORY     biopsy axillary ,right arm  . right arm biopsy    . UPPER GI ENDOSCOPY N/A 10/08/2013   Procedure: UPPER GI ENDOSCOPY;  Surgeon: Odis Hollingshead, MD;  [facility];  Service: General;  Laterality: N/A;  . WISDOM TOOTH EXTRACTION      There were no vitals filed for this visit.   Subjective Assessment - 01/19/21 1441    Subjective Pt reports that her foot has been feeling a bit better after last visit.  She  has been compliant with her HEP.  The towel crunch is tiring.  She feels like the HEP is helpful    Limitations Standing;Walking;House hold activities    How long can you walk comfortably? "Short distances"    Patient Stated Goals Improve ankle pain, walk better    Currently in Pain? Yes    Pain Score 1     Pain Location Ankle    Pain Orientation Right    Pain Onset 1 to 4 weeks ago                             [facility] Adult PT Treatment/Exercise - 01/19/21 0001      Manual Therapy   Manual therapy comments posterior TC glide, pt in prone      Ankle Exercises: Stretches   Gastroc Stretch Limitations 3x 64'' longsitting with towel      Ankle Exercises: Aerobic   Recumbent Bike 5 min      Additional Ankle Exercises DO NOT USE   Towel Crunch Limitations 2'      Ankle Exercises: Seated   Towel Inversion/Eversion Limitations 2x10 ea 2#    BAPS Level 3   cc/cw/fwd/bkwd 20x ea   Other Seated Ankle Exercises ankle 4 way - RTB - 20x ea                    PT Short Term Goals - 01/10/21 1342      PT SHORT TERM GOAL #1   Title Patient will be I with initial HEP to progress with PT    Baseline provided at eval    Time 3    Period Weeks    Status New    Target Date 01/31/21      PT SHORT TERM GOAL #2   Title PT will review FOTO with patient by 3rd visit to understand expected progress    Baseline taken at eval    Time 3    Period Weeks    Status New    Target Date 01/31/21      PT SHORT TERM GOAL #3   Title Patient will achieve neutral ankle dorsiflexion AROM to improve walking    Baseline lacking 15 deg    Time 3    Period Weeks    Status New    Target Date 01/31/21             PT Long Term Goals - 01/10/21 1344      PT LONG TERM GOAL #1   Title Patient will be I with final HEP to maintain progress from PT    Baseline provided at eval    Time 6    Period Weeks    Status  New    Target Date 02/21/21      PT LONG TERM GOAL #2   Title  Patient will demonstrate >/= 5 deg ankle dorsiflexion AROM to improve wallking ability    Baseline lacking 15 deg at eval    Time 6    Period Weeks    Status New    Target Date 02/21/21      PT LONG TERM GOAL #3   Title Patient will demonstrate >/= 4+/5 ankle strength in all directions to reduce pain with activity    Baseline see flowsheet    Time 6    Period Weeks    Status New    Target Date 02/21/21      PT LONG TERM GOAL #4   Title Patient will report >/= 60% functional status on FOTO to indicate improved function    Baseline 40%    Time 6    Period Weeks    Status New    Target Date 02/21/21      PT LONG TERM GOAL #5   Title Patient will report </= 3/10 pain level to reduce functional limitation    Baseline 5/10    Time 6    Period Weeks    Status New    Target Date 02/21/21                 Plan - 01/19/21 1459    Clinical Impression Statement Pt progressing well with therapy.  Having minimal baseline pain today.  Pt shows significant lack of great toe flexion d/t past surgical history which makes towel crunch difficulty.  Pt requires cuing to complete BAPS movments with out significant full LE compensation, but she is able to complete with good form with manual stabilization of her knee.  She has a minor increase in pain after finishing thearpy and significant fatigue.  Ankle 4 way with RTB added to HEP.    Personal Factors and Comorbidities Past/Current Experience;Time since onset of injury/illness/exacerbation    Examination-Activity Limitations Locomotion Level    Examination-Participation Restrictions Cleaning;Community Activity;Shop;Meal Prep    Stability/Clinical Decision Making Stable/Uncomplicated    Rehab Potential Good    PT Frequency 1x / week    PT Duration 6 weeks    PT Treatment/Interventions ADLs/Self Care Home Management;Aquatic Therapy;Cryotherapy;Electrical Stimulation;Iontophoresis 4mg /ml Dexamethasone;Moist Heat;Neuromuscular  re-education;Balance training;Therapeutic exercise;Therapeutic activities;Functional mobility training;Stair training;Gait training;Patient/family education;Manual techniques;Dry needling;Passive range of motion;Taping;Spinal Manipulations;Joint Manipulations    PT Next Visit Plan Review HEP and progress PRN, manual/stretching to improve ankle motion, progress AROM and initiate strengthening as tolerated, balance and gait training as tolerated    PT Home Exercise Plan [email or web address]    Consulted and Agree with Plan of Care Patient           Patient will benefit from skilled therapeutic intervention in order to improve the following deficits and impairments:  Abnormal gait,Decreased range of motion,Difficulty walking,Decreased activity tolerance,Pain,Decreased balance,Decreased strength  Visit Diagnosis: Pain in right ankle and joints of right foot  Stiffness of right ankle, not elsewhere classified  Muscle weakness (generalized)  Other abnormalities of gait and mobility     Problem List Patient Active Problem List   Diagnosis Date Noted  . Obesity (BMI 30-39.9) 02/02/2020  . Hallux rigidus, right foot 03/11/2018  . 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 Impingement syndrome of right ankle   . Sprain of calcaneofibular ligament of right ankle 07/04/2017  . Ankle pain, right 06/24/2017  . Hyperglycemia 02/18/2017  . Hepatitis B immune  02/18/2017  . 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 S/P total abdominal hysterectomy 06/28/2015  . Condyloma acuminatum 02/23/2015  . Urinary incontinence 01/05/2015  . Abnormal finding on GI tract imaging 10/12/2013  . Anemia-chronic 10/09/2013  . Achalasia s/p laparoscopic Heller myotomy and Dor fundoplication [id number]  . Ganglion cyst of finger of right hand 07/06/2013  . Trichomoniasis of vagina 06/03/2013  . GERD (gastroesophageal reflux disease) 03/16/2013  . Depression 02/10/2009  . Human immunodeficiency virus (HIV) disease ([place])  03/24/2007  . Anxiety state 03/24/2007  . ASTHMA 03/24/2007    Mathis Dad 01/19/2021, 3:30 PM  [facility] [address] [place], [place], [address] Phone: [phone]   Fax:  [phone]  Name: JOELENE GIEL MRN: [id number] Date of Birth: [birth year]

## 2021-01-20 DIAGNOSIS — M47817 Spondylosis without myelopathy or radiculopathy, lumbosacral region: Secondary | ICD-10-CM | POA: Diagnosis not present

## 2021-01-20 DIAGNOSIS — G894 Chronic pain syndrome: Secondary | ICD-10-CM | POA: Diagnosis not present

## 2021-01-20 DIAGNOSIS — M545 Low back pain, unspecified: Secondary | ICD-10-CM | POA: Diagnosis not present

## 2021-01-20 DIAGNOSIS — M5117 Intervertebral disc disorders with radiculopathy, lumbosacral region: Secondary | ICD-10-CM | POA: Diagnosis not present

## 2021-01-26 ENCOUNTER — Encounter: Payer: Self-pay | Admitting: Physical Therapy

## 2021-01-26 ENCOUNTER — Ambulatory Visit: Payer: Medicare HMO | Admitting: Physical Therapy

## 2021-01-26 ENCOUNTER — Other Ambulatory Visit: Payer: Self-pay

## 2021-01-26 DIAGNOSIS — M6281 Muscle weakness (generalized): Secondary | ICD-10-CM

## 2021-01-26 DIAGNOSIS — R2689 Other abnormalities of gait and mobility: Secondary | ICD-10-CM | POA: Diagnosis not present

## 2021-01-26 DIAGNOSIS — M25571 Pain in right ankle and joints of right foot: Secondary | ICD-10-CM

## 2021-01-26 DIAGNOSIS — M25671 Stiffness of right ankle, not elsewhere classified: Secondary | ICD-10-CM | POA: Diagnosis not present

## 2021-01-26 NOTE — Patient Instructions (Signed)
Access Code: [email or web address] URL: [email or web address] Date: 01/26/2021 Prepared by: Hilda Blades  Exercises Long Sitting Calf Stretch with Strap - 2-3 x daily - 7 x weekly - 3 reps - 30 hold Seated Heel Toe Raises - 2-3 x daily - 7 x weekly - 2 sets - 10 reps Ankle Inversion Eversion Towel Slide - 2-3 x daily - 7 x weekly - 2 sets - 10 reps Seated Toe Towel Scrunches - 2-3 x daily - 7 x weekly - 2 sets - 10 reps Long Sitting Ankle Plantar Flexion with Resistance - 1 x daily - 7 x weekly - 3 sets - 10 reps Long Sitting Ankle Eversion with Resistance - 1 x daily - 7 x weekly - 3 sets - 10 reps Long Sitting Ankle Inversion with Resistance - 1 x daily - 7 x weekly - 3 sets - 10 reps Long Sitting Ankle Dorsiflexion with Anchored Resistance - 1 x daily - 7 x weekly - 3 sets - 10 reps Standing Single Leg Stance with Counter Support - 1 x daily - 7 x weekly - 3 sets - 30 hold

## 2021-01-26 NOTE — Therapy (Signed)
[place], [place], [address] Phone: [phone]   Fax:  [phone]  Physical Therapy Treatment  Patient Details  Name: Danielle [name] MRN: [id number] Date of Birth: 1971/06/02 Referring Provider (PT): Persons, Bevely [place], [place]   Encounter Date: 01/26/2021   PT End of Session - 01/26/21 1430    Visit Number 3    Number of Visits 6    Date for PT Re-Evaluation 02/21/21    Authorization Type [employer] MCR    Progress Note Due on Visit 10    PT Start Time 6962    PT Stop Time 1445    PT Time Calculation (min) 50 min    Activity Tolerance Patient tolerated treatment well    Behavior During Therapy [facility] for tasks assessed/performed           Past Medical History:  Diagnosis Date  . Abnormal uterine bleeding (AUB)   . Achalasia    Type 2  . Anemia   . Anxiety   . Asthma   . Bipolar disorder ([employer])    Mixed  . Bulging lumbar disc   . Chronic back pain   . Condyloma acuminata   . Depression   . 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 Ganglion cyst    Right hand  . Genital HSV   . GERD (gastroesophageal reflux disease)   . Hepatitis    States she got a shot for this  . HIV infection ([place])   . Migraines   . Pneumonia   . Pre-diabetes   . Trichimoniasis   . Urinary incontinence   . Uterine fibroid   . Wears glasses     Past Surgical History:  Procedure Laterality Date  . ABDOMINAL HYSTERECTOMY N/A 06/28/2015   Procedure: HYSTERECTOMY ABDOMINAL;  Surgeon: Osborne Oman, MD;  Location: [facility];  Service: Gynecology;  Laterality: N/A;  Requested 06/28/15 @ 7:30a  . ANKLE ARTHROSCOPY Right 10/16/2017   Procedure: RIGHT ANKLE ARTHROSCOPIC DEBRIDEMENT;  Surgeon: Newt Minion, MD;  Location: [place] [facility];  Service: Orthopedics;  Laterality: Right;  . ARTHRODESIS METATARSALPHALANGEAL JOINT (MTPJ) Right 07/23/2018   Procedure: RIGHT GREAT TOE METATARSOPHALANGEAL JOINT FUSION;  Surgeon: Newt Minion, MD;   Location: [facility];  Service: Orthopedics;  Laterality: Right;  . BILATERAL SALPINGECTOMY Bilateral 06/28/2015   Procedure: BILATERAL SALPINGECTOMY;  Surgeon: Osborne Oman, MD;  Location: [facility];  Service: Gynecology;  Laterality: Bilateral;  . CESAREAN SECTION     twice;  vertical incision x 2  . COLONOSCOPY N/A 10/12/2013   Procedure: COLONOSCOPY;  Surgeon: Jerene Bears, MD;  [facility];  Service: Endoscopy;  Laterality: N/A;  . CYSTO WITH HYDRODISTENSION N/A 05/27/2018   Procedure: CYSTOSCOPY/HYDRODISTENSION AND INSTILLATION;  Surgeon: Bjorn Loser, MD;  Location: [facility];  Service: Urology;  Laterality: N/A;  . ESOPHAGEAL MANOMETRY N/A 06/29/2013   Procedure: ESOPHAGEAL MANOMETRY (EM);  Surgeon: Jerene Bears, MD;  [facility];  Service: Gastroenterology;  Laterality: N/A;  . FOREIGN BODY REMOVAL Left 2018   fiberglass, left thumb  . HELLER MYOTOMY N/A 10/08/2013   Procedure: LAPAROSCOPIC HELLER MYOTOMY, DOR FUNDIPLICATION, UPPER ENDOSCOPY;  Surgeon: Odis Hollingshead, MD;  [facility];  Service: General;  Laterality: N/A;  . HERNIA REPAIR     Umbilical  . HYSTEROSCOPY WITH RESECTOSCOPE N/A 05/03/2015   Procedure: Diagnostic Hysteroscopy;  Surgeon: Lavonia Drafts, MD;  Location: [facility];  Service: Gynecology;  Laterality: N/A;  wants  Novasure and myosure.  Marland Kitchen LYSIS OF ADHESION  06/28/2015   Procedure: LYSIS OF ADHESION;  Surgeon: Osborne Oman, MD;  Location: [facility];  Service: Gynecology;;  . OTHER SURGICAL HISTORY     biopsy axillary ,right arm  . right arm biopsy    . UPPER GI ENDOSCOPY N/A 10/08/2013   Procedure: UPPER GI ENDOSCOPY;  Surgeon: Odis Hollingshead, MD;  [facility];  Service: General;  Laterality: N/A;  . WISDOM TOOTH EXTRACTION      There were no vitals filed for this visit.   Subjective Assessment - 01/26/21 1404    Subjective Patient reports sometimes her foot will be feeling better and then some days  its hurting. States the exercises are going good.    Patient Stated Goals Improve ankle pain, walk better    Currently in Pain? Yes    Pain Score 3     Pain Location Foot   across MTP joints   Pain Orientation Right    Pain Descriptors / Indicators Throbbing;Aching    Pain Type Acute pain;Chronic pain    Pain Onset More than a month ago    Pain Frequency Intermittent              [facility] PT Assessment - 01/26/21 0001      AROM   Right Ankle Dorsiflexion 0    Right Ankle Plantar Flexion 45                         [facility] Adult PT Treatment/Exercise - 01/26/21 0001      Neuro Re-ed    Neuro Re-ed Details  SLS 3 x 30 sec each      Exercises   Exercises Ankle      Manual Therapy   Manual Therapy Joint mobilization;Passive ROM    Joint Mobilization TC PA/AP mobs, subtalar mobs, intertarsal mobs, 1st MTP mobs    Passive ROM PROM for right ankle DF      Ankle Exercises: Supine   T-Band 4-way ankle with red 2 x15      Ankle Exercises: Seated   Towel Inversion/Eversion Limitations 2x10 each with 2#    Toe Raise 10 reps    BAPS Level 3;15 reps   2 sets, cw/ccw/fwd/bwd/lat     Additional Ankle Exercises DO NOT USE   Towel Crunch Limitations 2 x 10                  PT Education - 01/26/21 1429    Education Details HEP update    Person(s) Educated Patient    Methods Explanation;Demonstration;Verbal cues;Handout    Comprehension Verbalized understanding;Need further instruction;Returned demonstration;Verbal cues required            PT Short Term Goals - 01/26/21 1437      PT SHORT TERM GOAL #1   Title Patient will be I with initial HEP to progress with PT    Baseline provided at eval    Time 3    Period Weeks    Status On-going    Target Date 01/31/21      PT SHORT TERM GOAL #2   Title PT will review FOTO with patient by 3rd visit to understand expected progress    Baseline Reviewed with patient on 01/26/21    Time 3    Period Weeks     Status Achieved    Target Date 01/31/21      PT SHORT TERM GOAL #  3   Title Patient will achieve neutral ankle dorsiflexion AROM to improve walking    Baseline Patient exhibits neutral dorsiflexion    Time 3    Period Weeks    Status Achieved    Target Date 01/31/21             PT Long Term Goals - 01/10/21 1344      PT LONG TERM GOAL #1   Title Patient will be I with final HEP to maintain progress from PT    Baseline provided at eval    Time 6    Period Weeks    Status New    Target Date 02/21/21      PT LONG TERM GOAL #2   Title Patient will demonstrate >/= 5 deg ankle dorsiflexion AROM to improve wallking ability    Baseline lacking 15 deg at eval    Time 6    Period Weeks    Status New    Target Date 02/21/21      PT LONG TERM GOAL #3   Title Patient will demonstrate >/= 4+/5 ankle strength in all directions to reduce pain with activity    Baseline see flowsheet    Time 6    Period Weeks    Status New    Target Date 02/21/21      PT LONG TERM GOAL #4   Title Patient will report >/= 60% functional status on FOTO to indicate improved function    Baseline 40%    Time 6    Period Weeks    Status New    Target Date 02/21/21      PT LONG TERM GOAL #5   Title Patient will report </= 3/10 pain level to reduce functional limitation    Baseline 5/10    Time 6    Period Weeks    Status New    Target Date 02/21/21                 Plan - 01/26/21 1430    Clinical Impression Statement Patient tolerated therapy well with no adverse effects. Patient demonstrates improved ankle DF this visit compared to eval. Continued with manual to improve motion and reduce pain, and progress strength with good tolerance. Cueing required with banded exercises and BAPSto avoid leg movement rather than isolated ankle movement. Initiated balance training this visit with good tolerance, patient did exhibit greater difficulty on right. Added SLS at counter to HEP. would benefit from  continued skilled PT to progress motion and strength in order to reduce pain, improve walking, and maximize functional ability.    PT Treatment/Interventions ADLs/Self Care Home Management;Aquatic Therapy;Cryotherapy;Electrical Stimulation;Iontophoresis 4mg /ml Dexamethasone;Moist Heat;Neuromuscular re-education;Balance training;Therapeutic exercise;Therapeutic activities;Functional mobility training;Stair training;Gait training;Patient/family education;Manual techniques;Dry needling;Passive range of motion;Taping;Spinal Manipulations;Joint Manipulations    PT Next Visit Plan Review HEP and progress PRN, manual/stretching to improve ankle motion, progress AROM and initiate strengthening as tolerated, balance and gait training as tolerated    PT Home Exercise Plan [email or web address]    Consulted and Agree with Plan of Care Patient           Patient will benefit from skilled therapeutic intervention in order to improve the following deficits and impairments:  Abnormal gait,Decreased range of motion,Difficulty walking,Decreased activity tolerance,Pain,Decreased balance,Decreased strength  Visit Diagnosis: Pain in right ankle and joints of right foot  Stiffness of right ankle, not elsewhere classified  Muscle weakness (generalized)  Other abnormalities of gait and mobility     Problem List Patient  Active Problem List   Diagnosis Date Noted  . Obesity (BMI 30-39.9) 02/02/2020  . Hallux rigidus, right foot 03/11/2018  . 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 Impingement syndrome of right ankle   . Sprain of calcaneofibular ligament of right ankle 07/04/2017  . Ankle pain, right 06/24/2017  . Hyperglycemia 02/18/2017  . Hepatitis B immune 02/18/2017  . 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 S/P total abdominal hysterectomy 06/28/2015  . Condyloma acuminatum 02/23/2015  . Urinary incontinence 01/05/2015  . Abnormal finding on GI tract imaging 10/12/2013  . Anemia-chronic 10/09/2013  .  Achalasia s/p laparoscopic Heller myotomy and Dor fundoplication 39/11/90  . Ganglion cyst of finger of right hand 07/06/2013  . Trichomoniasis of vagina 06/03/2013  . GERD (gastroesophageal reflux disease) 03/16/2013  . Depression 02/10/2009  . Human immunodeficiency virus (HIV) disease ([place]) 03/24/2007  . Anxiety state 03/24/2007  . ASTHMA 03/24/2007    Hilda Blades, PT, DPT, LAT, ATC 01/26/21  2:46 PM Phone: [phone] Fax: [facility] [address] [place], [place], [address] Phone: [phone]   Fax:  [phone]  Name: JUSTYCE YEATER MRN: [id number] Date of Birth: 01-31-1971

## 2021-02-01 ENCOUNTER — Ambulatory Visit (INDEPENDENT_AMBULATORY_CARE_PROVIDER_SITE_OTHER): Payer: Medicare HMO | Admitting: Physician Assistant

## 2021-02-01 ENCOUNTER — Ambulatory Visit: Payer: Medicare HMO | Admitting: Physician Assistant

## 2021-02-01 ENCOUNTER — Encounter: Payer: Self-pay | Admitting: Physician Assistant

## 2021-02-01 ENCOUNTER — Other Ambulatory Visit: Payer: Self-pay

## 2021-02-01 DIAGNOSIS — M25571 Pain in right ankle and joints of right foot: Secondary | ICD-10-CM | POA: Diagnosis not present

## 2021-02-01 DIAGNOSIS — G8929 Other chronic pain: Secondary | ICD-10-CM

## 2021-02-01 MED ORDER — GABAPENTIN 300 MG PO CAPS: 300.0000 mg | ORAL_CAPSULE | Freq: Three times a day (TID) | ORAL | 3 refills | Status: DC
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
## 2021-02-02 ENCOUNTER — Other Ambulatory Visit: Payer: Self-pay

## 2021-02-02 ENCOUNTER — Encounter: Payer: Self-pay | Admitting: Physical Therapy

## 2021-02-02 ENCOUNTER — Ambulatory Visit: Payer: Medicare HMO | Admitting: Physical Therapy

## 2021-02-02 DIAGNOSIS — M25671 Stiffness of right ankle, not elsewhere classified: Secondary | ICD-10-CM | POA: Diagnosis not present

## 2021-02-02 DIAGNOSIS — M25571 Pain in right ankle and joints of right foot: Secondary | ICD-10-CM | POA: Diagnosis not present

## 2021-02-02 DIAGNOSIS — M6281 Muscle weakness (generalized): Secondary | ICD-10-CM | POA: Diagnosis not present

## 2021-02-02 DIAGNOSIS — R2689 Other abnormalities of gait and mobility: Secondary | ICD-10-CM | POA: Diagnosis not present

## 2021-02-02 NOTE — Therapy (Signed)
[place], [place], [address] Phone: [phone]   Fax:  [phone]  Physical Therapy Treatment  Patient Details  Name: Danielle [name] MRN: [id number] Date of Birth: 11-10-1970 Referring Provider (PT): Persons, Bevely [place], [place]   Encounter Date: 02/02/2021   PT End of Session - 02/02/21 1355    Visit Number 4    Number of Visits 6    Date for PT Re-Evaluation 02/21/21    Authorization Type [employer] MCR    Progress Note Due on Visit 10    PT Start Time 1350    PT Stop Time 1435    PT Time Calculation (min) 45 min    Activity Tolerance Patient tolerated treatment well    Behavior During Therapy [facility] for tasks assessed/performed           Past Medical History:  Diagnosis Date  . Abnormal uterine bleeding (AUB)   . Achalasia    Type 2  . Anemia   . Anxiety   . Asthma   . Bipolar disorder ([place])    Mixed  . Bulging lumbar disc   . Chronic back pain   . Condyloma acuminata   . Depression   . 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 Ganglion cyst    Right hand  . Genital HSV   . GERD (gastroesophageal reflux disease)   . Hepatitis    States she got a shot for this  . HIV infection ([place])   . Migraines   . Pneumonia   . Pre-diabetes   . Trichimoniasis   . Urinary incontinence   . Uterine fibroid   . Wears glasses     Past Surgical History:  Procedure Laterality Date  . ABDOMINAL HYSTERECTOMY N/A 06/28/2015   Procedure: HYSTERECTOMY ABDOMINAL;  Surgeon: Osborne Oman, MD;  Location: [facility];  Service: Gynecology;  Laterality: N/A;  Requested 06/28/15 @ 7:30a  . ANKLE ARTHROSCOPY Right 10/16/2017   Procedure: RIGHT ANKLE ARTHROSCOPIC DEBRIDEMENT;  Surgeon: Newt Minion, MD;  Location: [facility];  Service: Orthopedics;  Laterality: Right;  . ARTHRODESIS METATARSALPHALANGEAL JOINT (MTPJ) Right 07/23/2018   Procedure: RIGHT GREAT TOE METATARSOPHALANGEAL JOINT FUSION;  Surgeon: Newt Minion, MD;   Location: [facility];  Service: Orthopedics;  Laterality: Right;  . BILATERAL SALPINGECTOMY Bilateral 06/28/2015   Procedure: BILATERAL SALPINGECTOMY;  Surgeon: Osborne Oman, MD;  Location: [facility];  Service: Gynecology;  Laterality: Bilateral;  . CESAREAN SECTION     twice;  vertical incision x 2  . COLONOSCOPY N/A 10/12/2013   Procedure: COLONOSCOPY;  Surgeon: Jerene Bears, MD;  [facility];  Service: Endoscopy;  Laterality: N/A;  . CYSTO WITH HYDRODISTENSION N/A 05/27/2018   Procedure: CYSTOSCOPY/HYDRODISTENSION AND INSTILLATION;  Surgeon: Bjorn Loser, MD;  Location: [facility];  Service: Urology;  Laterality: N/A;  . ESOPHAGEAL MANOMETRY N/A 06/29/2013   Procedure: ESOPHAGEAL MANOMETRY (EM);  Surgeon: Jerene Bears, MD;  [facility];  Service: Gastroenterology;  Laterality: N/A;  . FOREIGN BODY REMOVAL Left 2018   fiberglass, left thumb  . HELLER MYOTOMY N/A 10/08/2013   Procedure: LAPAROSCOPIC HELLER MYOTOMY, DOR FUNDIPLICATION, UPPER ENDOSCOPY;  Surgeon: Odis Hollingshead, MD;  Location[facility];  Service: General;  Laterality: N/A;  . HERNIA REPAIR     Umbilical  . HYSTEROSCOPY WITH RESECTOSCOPE N/A 05/03/2015   Procedure: Diagnostic Hysteroscopy;  Surgeon: Lavonia Drafts, MD;  Location: [facility];  Service: Gynecology;  Laterality: N/A;  wants  Novasure and myosure.  Marland Kitchen LYSIS OF ADHESION  06/28/2015   Procedure: LYSIS OF ADHESION;  Surgeon: Osborne Oman, MD;  Location: [facility];  Service: Gynecology;;  . OTHER SURGICAL HISTORY     biopsy axillary ,right arm  . right arm biopsy    . UPPER GI ENDOSCOPY N/A 10/08/2013   Procedure: UPPER GI ENDOSCOPY;  Surgeon: Odis Hollingshead, MD;  Location[facility];  Service: General;  Laterality: N/A;  . WISDOM TOOTH EXTRACTION      There were no vitals filed for this visit.   Subjective Assessment - 02/02/21 1353    Subjective Patient reports she is ok, she saw the doctor the other day and is going to be  scheduled for an MRI. States exercises are going well, she is conistent in performing them.    Patient Stated Goals Improve ankle pain, walk better    Currently in Pain? Yes    Pain Score 7     Pain Location Foot    Pain Orientation Right    Pain Descriptors / Indicators Throbbing;Aching    Pain Type Chronic pain    Pain Onset More than a month ago    Pain Frequency Intermittent              [facility] PT Assessment - 02/02/21 0001      Strength   Right Ankle Dorsiflexion 4/5    Right Ankle Plantar Flexion 3+/5    Right Ankle Inversion 4/5    Right Ankle Eversion 4-/5                         [facility] Adult PT Treatment/Exercise - 02/02/21 0001      Exercises   Exercises Ankle      Manual Therapy   Manual Therapy Joint mobilization;Passive ROM    Joint Mobilization TC distraction and PA/AP mobs, subtalar distraction and mobs, intertarsal mobs, 1st MTP mobs    Passive ROM PROM for right ankle all directions and toe flexion/extension      Ankle Exercises: Aerobic   Nustep L5 x 5 min with LE only while taking subjective      Ankle Exercises: Standing   SLS 3 x 30 sec each    Rocker Board 2 minutes   2 sets   Rocker Board Limitations fwd/bwd    Heel Raises 20 reps   2 sets   Heel Raises Limitations heel-toe raises on Airex pad      Ankle Exercises: Seated   Towel Inversion/Eversion Limitations 2x10 each with 3#    BAPS Level 3;15 reps   2 sets, cw/ccw/fwd/bwd/lat     Ankle Exercises: Supine   T-Band 4-way ankle with red 2 x15                  PT Education - 02/02/21 1355    Education Details HEP    Person(s) Educated Patient    Methods Explanation    Comprehension Verbalized understanding;Need further instruction            PT Short Term Goals - 02/02/21 1435      PT SHORT TERM GOAL #1   Title Patient will be I with initial HEP to progress with PT    Baseline patient I with initiate HEP    Time 3    Period Weeks    Status Achieved     Target Date 01/31/21      PT SHORT TERM GOAL #2  Title PT will review FOTO with patient by 3rd visit to understand expected progress    Baseline Reviewed with patient on 01/26/21    Time 3    Period Weeks    Status Achieved    Target Date 01/31/21      PT SHORT TERM GOAL #3   Title Patient will achieve neutral ankle dorsiflexion AROM to improve walking    Baseline Patient exhibits neutral dorsiflexion    Time 3    Period Weeks    Status Achieved    Target Date 01/31/21             PT Long Term Goals - 01/10/21 1344      PT LONG TERM GOAL #1   Title Patient will be I with final HEP to maintain progress from PT    Baseline provided at eval    Time 6    Period Weeks    Status New    Target Date 02/21/21      PT LONG TERM GOAL #2   Title Patient will demonstrate >/= 5 deg ankle dorsiflexion AROM to improve wallking ability    Baseline lacking 15 deg at eval    Time 6    Period Weeks    Status New    Target Date 02/21/21      PT LONG TERM GOAL #3   Title Patient will demonstrate >/= 4+/5 ankle strength in all directions to reduce pain with activity    Baseline see flowsheet    Time 6    Period Weeks    Status New    Target Date 02/21/21      PT LONG TERM GOAL #4   Title Patient will report >/= 60% functional status on FOTO to indicate improved function    Baseline 40%    Time 6    Period Weeks    Status New    Target Date 02/21/21      PT LONG TERM GOAL #5   Title Patient will report </= 3/10 pain level to reduce functional limitation    Baseline 5/10    Time 6    Period Weeks    Status New    Target Date 02/21/21                 Plan - 02/02/21 1356    Clinical Impression Statement Patient tolerated therapy well with no adverse effects. She exhibits improved strength of the right ankle this visit and is progressing ankle strengthening, control, and standing tolerance well. Use of Airex pad for heel-toe raises to provided cushion and reduce stress  places on great toe. Patient did report slight increase in discomfort across 1st MTP joint on right but this resolved with rest after exercise. No change to HEP this visit. Patient would benefit from continued skilled PT to progress motion and strength in order to reduce pain, improve walking, and maximize functional ability.    PT Treatment/Interventions ADLs/Self Care Home Management;Aquatic Therapy;Cryotherapy;Electrical Stimulation;Iontophoresis 4mg /ml Dexamethasone;Moist Heat;Neuromuscular re-education;Balance training;Therapeutic exercise;Therapeutic activities;Functional mobility training;Stair training;Gait training;Patient/family education;Manual techniques;Dry needling;Passive range of motion;Taping;Spinal Manipulations;Joint Manipulations    PT Next Visit Plan Review HEP and progress PRN, manual/stretching to improve ankle motion, progress AROM and initiate strengthening as tolerated, balance and gait training as tolerated    PT Home Exercise Plan [id number]    Consulted and Agree with Plan of Care Patient           Patient will benefit from skilled therapeutic intervention in order  to improve the following deficits and impairments:  Abnormal gait,Decreased range of motion,Difficulty walking,Decreased activity tolerance,Pain,Decreased balance,Decreased strength  Visit Diagnosis: Pain in right ankle and joints of right foot  Stiffness of right ankle, not elsewhere classified  Muscle weakness (generalized)  Other abnormalities of gait and mobility     Problem List Patient Active Problem List   Diagnosis Date Noted  . Obesity (BMI 30-39.9) 02/02/2020  . Hallux rigidus, right foot 03/11/2018  . 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 Impingement syndrome of right ankle   . Sprain of calcaneofibular ligament of right ankle 07/04/2017  . Ankle pain, right 06/24/2017  . Hyperglycemia 02/18/2017  . Hepatitis B immune 02/18/2017  . 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 S/P  total abdominal hysterectomy 06/28/2015  . Condyloma acuminatum 02/23/2015  . Urinary incontinence 01/05/2015  . Abnormal finding on GI tract imaging 10/12/2013  . Anemia-chronic 10/09/2013  . Achalasia s/p laparoscopic Heller myotomy and Dor fundoplication 77/82/4235  . Ganglion cyst of finger of right hand 07/06/2013  . Trichomoniasis of vagina 06/03/2013  . GERD (gastroesophageal reflux disease) 03/16/2013  . Depression 02/10/2009  . Human immunodeficiency virus (HIV) disease ([place]) 03/24/2007  . Anxiety state 03/24/2007  . ASTHMA 03/24/2007    Hilda Blades, PT, DPT, LAT, ATC 02/02/21  2:41 PM Phone: [phone] Fax: [facility] [address] [place], [place], [address] Phone: [phone]   Fax:  [phone]  Name: Danielle [name] MRN: [id number] Date of Birth: [birth year]

## 2021-02-07 DIAGNOSIS — E7849 Other hyperlipidemia: Secondary | ICD-10-CM | POA: Diagnosis not present

## 2021-02-07 DIAGNOSIS — G43909 Migraine, unspecified, not intractable, without status migrainosus: Secondary | ICD-10-CM | POA: Diagnosis not present

## 2021-02-07 DIAGNOSIS — I10 Essential (primary) hypertension: Secondary | ICD-10-CM | POA: Diagnosis not present

## 2021-02-09 ENCOUNTER — Ambulatory Visit: Payer: Medicare HMO | Attending: Physician Assistant | Admitting: Physical Therapy

## 2021-02-09 ENCOUNTER — Encounter: Payer: Self-pay | Admitting: Physical Therapy

## 2021-02-09 ENCOUNTER — Other Ambulatory Visit: Payer: Self-pay

## 2021-02-09 DIAGNOSIS — M6281 Muscle weakness (generalized): Secondary | ICD-10-CM | POA: Diagnosis not present

## 2021-02-09 DIAGNOSIS — R2689 Other abnormalities of gait and mobility: Secondary | ICD-10-CM | POA: Diagnosis not present

## 2021-02-09 DIAGNOSIS — M25671 Stiffness of right ankle, not elsewhere classified: Secondary | ICD-10-CM | POA: Diagnosis not present

## 2021-02-09 DIAGNOSIS — M25571 Pain in right ankle and joints of right foot: Secondary | ICD-10-CM | POA: Insufficient documentation

## 2021-02-09 DIAGNOSIS — M545 Low back pain, unspecified: Secondary | ICD-10-CM | POA: Diagnosis not present

## 2021-02-09 DIAGNOSIS — Z7189 Other specified counseling: Secondary | ICD-10-CM | POA: Diagnosis not present

## 2021-02-09 NOTE — Therapy (Addendum)
[place], [place], [address] Phone: [phone]   Fax:  [phone]  Physical Therapy Treatment / Discharge  Patient Details  Name: Danielle [name] MRN: [id number] Date of Birth: 14-Apr-1971 Referring Provider (PT): Persons, Bevely Palmer, [place]   Encounter Date: 02/09/2021   PT End of Session - 02/09/21 1403     Visit Number 5    Number of Visits 6    Date for PT Re-Evaluation 02/21/21    Authorization Type [employer] MCR    Authorization Time Period 01/10/2021 - 02/18/2021    Progress Note Due on Visit 10    PT Start Time 1358    PT Stop Time 1440    PT Time Calculation (min) 42 min    Activity Tolerance Patient tolerated treatment well    Behavior During Therapy [facility] for tasks assessed/performed             Past Medical History:  Diagnosis Date   Abnormal uterine bleeding (AUB)    Achalasia    Type 2   Anemia    Anxiety    Asthma    Bipolar disorder (HCC)    Mixed   Bulging lumbar disc    Chronic back pain    Condyloma acuminata    Depression    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   Ganglion cyst    Right hand   Genital HSV    GERD (gastroesophageal reflux disease)    Hepatitis    States she got a shot for this   HIV infection ([place])    Migraines    Pneumonia    Pre-diabetes    Trichimoniasis    Urinary incontinence    Uterine fibroid    Wears glasses     Past Surgical History:  Procedure Laterality Date   ABDOMINAL HYSTERECTOMY N/A 06/28/2015   Procedure: HYSTERECTOMY ABDOMINAL;  Surgeon: Osborne Oman, MD;  Location: [facility];  Service: Gynecology;  Laterality: N/A;  Requested 06/28/15 @ 7:30a   ANKLE ARTHROSCOPY Right 10/16/2017   Procedure: RIGHT ANKLE ARTHROSCOPIC DEBRIDEMENT;  Surgeon: Newt Minion, MD;  Location: [facility];  Service: Orthopedics;  Laterality: Right;   ARTHRODESIS METATARSALPHALANGEAL JOINT (MTPJ) Right 07/23/2018   Procedure: RIGHT GREAT TOE METATARSOPHALANGEAL  JOINT FUSION;  Surgeon: Newt Minion, MD;  Location: [facility];  Service: Orthopedics;  Laterality: Right;   BILATERAL SALPINGECTOMY Bilateral 06/28/2015   Procedure: BILATERAL SALPINGECTOMY;  Surgeon: Osborne Oman, MD;  Location: [facility];  Service: Gynecology;  Laterality: Bilateral;   CESAREAN SECTION     twice;  vertical incision x 2   COLONOSCOPY N/A 10/12/2013   Procedure: COLONOSCOPY;  Surgeon: Jerene Bears, MD;  [facility];  Service: Endoscopy;  Laterality: N/A;   CYSTO WITH HYDRODISTENSION N/A 05/27/2018   Procedure: CYSTOSCOPY/HYDRODISTENSION AND INSTILLATION;  Surgeon: Bjorn Loser, MD;  Location: [facility];  Service: Urology;  Laterality: N/A;   ESOPHAGEAL MANOMETRY N/A 06/29/2013   Procedure: ESOPHAGEAL MANOMETRY (EM);  Surgeon: Jerene Bears, MD;  [facility];  Service: Gastroenterology;  Laterality: N/A;   FOREIGN BODY REMOVAL Left 2018   fiberglass, left thumb   HELLER MYOTOMY N/A 10/08/2013   Procedure: LAPAROSCOPIC HELLER MYOTOMY, DOR FUNDIPLICATION, UPPER ENDOSCOPY;  Surgeon: Odis Hollingshead, MD;  [facility];  Service: General;  Laterality: N/A;   HERNIA REPAIR     Umbilical   HYSTEROSCOPY WITH RESECTOSCOPE N/A 05/03/2015   Procedure: Diagnostic Hysteroscopy;  Surgeon: Hoyle Sauer  Harraway-Smith, MD;  Location: [facility];  Service: Gynecology;  Laterality: N/A;  wants Novasure and myosure.   LYSIS OF ADHESION  06/28/2015   Procedure: LYSIS OF ADHESION;  Surgeon: Osborne Oman, MD;  Location: [facility];  Service: Gynecology;;   OTHER SURGICAL HISTORY     biopsy axillary ,right arm   right arm biopsy     UPPER GI ENDOSCOPY N/A 10/08/2013   Procedure: UPPER GI ENDOSCOPY;  Surgeon: Odis Hollingshead, MD;  [facility];  Service: General;  Laterality: N/A;   WISDOM TOOTH EXTRACTION      There were no vitals filed for this visit.   Subjective Assessment - 02/09/21 1401     Subjective Patient reports ankle and foot are doing ok.  She has been scheduled for an MRO on 02/14/21. She keeps doing the exercises.    Patient Stated Goals Improve ankle pain, walk better    Currently in Pain? Yes    Pain Score 2     Pain Location Foot    Pain Orientation Right    Pain Descriptors / Indicators Aching    Pain Type Chronic pain    Pain Onset More than a month ago    Pain Frequency Intermittent                [facility] PT Assessment - 02/09/21 0001       Single Leg Stance   Comments able to maintain SLS 30 sec bilaterally      AROM   Right Ankle Dorsiflexion 3                           [facility] Adult PT Treatment/Exercise - 02/09/21 0001       Exercises   Exercises Ankle      Ankle Exercises: Stretches   Gastroc Stretch 2 reps;30 seconds    Gastroc Stretch Limitations standing at Garment/textile technologist 3 reps;30 seconds      Ankle Exercises: Aerobic   Nustep L5 x 5 min with LE only while taking subjective      Ankle Exercises: Standing   SLS 4 x 30 sec each   last 2 sets on Airex   Rocker Board 2 minutes   2 sets   Rocker Board Limitations fwd/bwd    Heel Raises 20 reps   2 sets   Heel Raises Limitations heel-toe raises      Ankle Exercises: Seated   BAPS Level 4;15 reps   2 sets   BAPS Limitations fwd/bwd, lateral, cw/ccw      Ankle Exercises: Supine   T-Band 4-way ankle with green 2 x 15                    PT Education - 02/09/21 1402     Education Details HEP update    Person(s) Educated Patient    Methods Explanation;Demonstration;Verbal cues;Handout    Comprehension Verbalized understanding;Need further instruction;Returned demonstration;Verbal cues required              PT Short Term Goals - 02/02/21 1435       PT SHORT TERM GOAL #1   Title Patient will be I with initial HEP to progress with PT    Baseline patient I with initiate HEP    Time 3    Period Weeks    Status Achieved    Target Date 01/31/21      PT SHORT  TERM GOAL #2   Title PT  will review FOTO with patient by 3rd visit to understand expected progress    Baseline Reviewed with patient on 01/26/21    Time 3    Period Weeks    Status Achieved    Target Date 01/31/21      PT SHORT TERM GOAL #3   Title Patient will achieve neutral ankle dorsiflexion AROM to improve walking    Baseline Patient exhibits neutral dorsiflexion    Time 3    Period Weeks    Status Achieved    Target Date 01/31/21               PT Long Term Goals - 01/10/21 1344       PT LONG TERM GOAL #1   Title Patient will be I with final HEP to maintain progress from PT    Baseline provided at eval    Time 6    Period Weeks    Status New    Target Date 02/21/21      PT LONG TERM GOAL #2   Title Patient will demonstrate >/= 5 deg ankle dorsiflexion AROM to improve wallking ability    Baseline lacking 15 deg at eval    Time 6    Period Weeks    Status New    Target Date 02/21/21      PT LONG TERM GOAL #3   Title Patient will demonstrate >/= 4+/5 ankle strength in all directions to reduce pain with activity    Baseline see flowsheet    Time 6    Period Weeks    Status New    Target Date 02/21/21      PT LONG TERM GOAL #4   Title Patient will report >/= 60% functional status on FOTO to indicate improved function    Baseline 40%    Time 6    Period Weeks    Status New    Target Date 02/21/21      PT LONG TERM GOAL #5   Title Patient will report </= 3/10 pain level to reduce functional limitation    Baseline 5/10    Time 6    Period Weeks    Status New    Target Date 02/21/21                   Plan - 02/09/21 1403     Clinical Impression Statement Patient tolerated therapy well with no adverse effects. She is continuing to progress with with ankle stretching, strengthening, and balanace. She reports less pain this visit and did not report any increase in ankle/foot pain with therapy. Patient demonstrates improved balance and ankle motion this visit. Progressed  HEP to incorporate stronger band and standing strengthening. Patient would benefit from continued skilled PT to progress motion and strength in order to reduce pain, improve walking, and maximize functional ability.    PT Treatment/Interventions ADLs/Self Care Home Management;Aquatic Therapy;Cryotherapy;Electrical Stimulation;Iontophoresis 29m/ml Dexamethasone;Moist Heat;Neuromuscular re-education;Balance training;Therapeutic exercise;Therapeutic activities;Functional mobility training;Stair training;Gait training;Patient/family education;Manual techniques;Dry needling;Passive range of motion;Taping;Spinal Manipulations;Joint Manipulations    PT Next Visit Plan Review HEP and progress PRN, manual/stretching to improve ankle motion, progress AROM and initiate strengthening as tolerated, balance and gait training as tolerated    PT Home Exercise Plan [email or web address]    Consulted and Agree with Plan of Care Patient             Patient will benefit from skilled therapeutic intervention in order to improve  the following deficits and impairments:  Abnormal gait,Decreased range of motion,Difficulty walking,Decreased activity tolerance,Pain,Decreased balance,Decreased strength  Visit Diagnosis: Pain in right ankle and joints of right foot  Stiffness of right ankle, not elsewhere classified  Muscle weakness (generalized)  Other abnormalities of gait and mobility     Problem List Patient Active Problem List   Diagnosis Date Noted   Obesity (BMI 30-39.9) 02/02/2020   Hallux rigidus, right foot 03/11/2018   Achilles tendon contracture, right 03/11/2018   Impingement syndrome of right ankle    Sprain of calcaneofibular ligament of right ankle 07/04/2017   Ankle pain, right 06/24/2017   Hyperglycemia 02/18/2017   Hepatitis B immune 02/18/2017   Fibroadenoma of breast 02/18/2017   S/P total abdominal hysterectomy 06/28/2015   Condyloma acuminatum 02/23/2015   Urinary incontinence 01/05/2015    Abnormal finding on GI tract imaging 10/12/2013   Anemia-chronic 10/09/2013   Achalasia s/p laparoscopic Heller myotomy and Dor fundoplication 19/37/9024   Ganglion cyst of finger of right hand 07/06/2013   Trichomoniasis of vagina 06/03/2013   GERD (gastroesophageal reflux disease) 03/16/2013   Depression 02/10/2009   Human immunodeficiency virus (HIV) disease ([place]) 03/24/2007   Anxiety state 03/24/2007   ASTHMA 03/24/2007    Hilda Blades, PT, DPT, LAT, ATC 02/09/21  2:43 PM Phone: [phone] Fax: [facility] [address] [place], [place], [address] Phone: [phone]   Fax:  [phone]  Name: Danielle [name] MRN: [id number] Date of Birth: [birth year]   PHYSICAL THERAPY DISCHARGE SUMMARY  Visits from Start of Care: 5  Current functional level related to goals / functional outcomes: See above   Remaining deficits: See above   Education / Equipment: HEP   Patient agrees to discharge. Patient goals were not met. Patient is being discharged due to not returning since the last visit.

## 2021-02-09 NOTE — Patient Instructions (Signed)
Access Code: [email or web address] URL: [email or web address] Date: 02/09/2021 Prepared by: Hilda Blades  Exercises Ankle Inversion Eversion Towel Slide - 2-3 x daily - 7 x weekly - 2 sets - 10 reps Seated Toe Towel Scrunches - 2-3 x daily - 7 x weekly - 2 sets - 10 reps Long Sitting Ankle Plantar Flexion with Resistance - 1 x daily - 7 x weekly - 3 sets - 10 reps Long Sitting Ankle Eversion with Resistance - 1 x daily - 7 x weekly - 3 sets - 10 reps Long Sitting Ankle Inversion with Resistance - 1 x daily - 7 x weekly - 3 sets - 10 reps Long Sitting Ankle Dorsiflexion with Anchored Resistance - 1 x daily - 7 x weekly - 3 sets - 10 reps Standing Single Leg Stance with Counter Support - 1 x daily - 7 x weekly - 3 sets - 30 hold Heel Toe Raises with Counter Support - 1 x daily - 7 x weekly - 3 sets - 10 reps Standing Gastroc Stretch at Counter - 2 x daily - 7 x weekly - 3 reps - 30 hold

## 2021-02-14 ENCOUNTER — Ambulatory Visit
Admission: RE | Admit: 2021-02-14 | Discharge: 2021-02-14 | Disposition: A | Discharge status: Home / Self Care | Payer: Medicare HMO | Source: Ambulatory Visit | Attending: Physician Assistant | Admitting: Physician Assistant

## 2021-02-14 DIAGNOSIS — Z981 Arthrodesis status: Secondary | ICD-10-CM | POA: Diagnosis not present

## 2021-02-14 DIAGNOSIS — G8929 Other chronic pain: Secondary | ICD-10-CM

## 2021-02-14 DIAGNOSIS — M25571 Pain in right ankle and joints of right foot: Secondary | ICD-10-CM | POA: Diagnosis not present

## 2021-02-15 ENCOUNTER — Ambulatory Visit: Payer: Medicare HMO | Admitting: Physical Therapy

## 2021-02-16 ENCOUNTER — Encounter: Payer: Self-pay | Admitting: Orthopedic Surgery

## 2021-02-16 ENCOUNTER — Ambulatory Visit (INDEPENDENT_AMBULATORY_CARE_PROVIDER_SITE_OTHER): Payer: Medicare HMO | Admitting: Orthopedic Surgery

## 2021-02-16 DIAGNOSIS — M25571 Pain in right ankle and joints of right foot: Secondary | ICD-10-CM

## 2021-02-16 DIAGNOSIS — G8929 Other chronic pain: Secondary | ICD-10-CM | POA: Diagnosis not present

## 2021-02-16 DIAGNOSIS — S93324S Dislocation of tarsometatarsal joint of right foot, sequela: Secondary | ICD-10-CM
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
## 2021-02-20 ENCOUNTER — Encounter: Payer: Medicare HMO | Admitting: Physical Therapy

## 2021-02-23 ENCOUNTER — Telehealth: Payer: Self-pay | Admitting: Orthopedic Surgery
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
## 2021-02-23 NOTE — Telephone Encounter (Signed)
I called pt and advised of message below.

## 2021-02-24 DIAGNOSIS — M47817 Spondylosis without myelopathy or radiculopathy, lumbosacral region: Secondary | ICD-10-CM | POA: Diagnosis not present

## 2021-02-24 DIAGNOSIS — G894 Chronic pain syndrome: Secondary | ICD-10-CM | POA: Diagnosis not present

## 2021-02-24 DIAGNOSIS — M79673 Pain in unspecified foot: Secondary | ICD-10-CM | POA: Diagnosis not present

## 2021-02-24 DIAGNOSIS — M5117 Intervertebral disc disorders with radiculopathy, lumbosacral region: Secondary | ICD-10-CM | POA: Diagnosis not present

## 2021-02-24 DIAGNOSIS — Z79891 Long term (current) use of opiate analgesic: Secondary | ICD-10-CM | POA: Diagnosis not present

## 2021-02-27 ENCOUNTER — Encounter: Payer: Medicare HMO | Admitting: Physical Therapy

## 2021-03-06 DIAGNOSIS — M25571 Pain in right ankle and joints of right foot: Secondary | ICD-10-CM | POA: Diagnosis not present

## 2021-03-08 ENCOUNTER — Other Ambulatory Visit: Payer: Self-pay | Admitting: Family

## 2021-03-08 DIAGNOSIS — B20 Human immunodeficiency virus [HIV] disease: Secondary | ICD-10-CM

## 2021-03-09 DIAGNOSIS — E7849 Other hyperlipidemia: Secondary | ICD-10-CM | POA: Diagnosis not present

## 2021-03-09 DIAGNOSIS — I10 Essential (primary) hypertension: Secondary | ICD-10-CM | POA: Diagnosis not present

## 2021-03-09 DIAGNOSIS — G43009 Migraine without aura, not intractable, without status migrainosus: Secondary | ICD-10-CM | POA: Diagnosis not present

## 2021-03-16 ENCOUNTER — Other Ambulatory Visit: Payer: Self-pay

## 2021-03-16 ENCOUNTER — Ambulatory Visit (INDEPENDENT_AMBULATORY_CARE_PROVIDER_SITE_OTHER): Payer: Medicare HMO | Admitting: Orthopedic Surgery

## 2021-03-16 ENCOUNTER — Encounter: Payer: Self-pay | Admitting: Orthopedic Surgery

## 2021-03-16 ENCOUNTER — Ambulatory Visit (INDEPENDENT_AMBULATORY_CARE_PROVIDER_SITE_OTHER): Payer: Medicare HMO

## 2021-03-16 DIAGNOSIS — M79671 Pain in right foot: Secondary | ICD-10-CM

## 2021-03-16 DIAGNOSIS — S93324S Dislocation of tarsometatarsal joint of right foot, sequela: Secondary | ICD-10-CM | POA: Diagnosis not present
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
## 2021-03-21 DIAGNOSIS — M545 Low back pain, unspecified: Secondary | ICD-10-CM | POA: Diagnosis not present

## 2021-03-21 DIAGNOSIS — M25571 Pain in right ankle and joints of right foot: Secondary | ICD-10-CM | POA: Diagnosis not present

## 2021-03-21 DIAGNOSIS — M25531 Pain in right wrist: Secondary | ICD-10-CM | POA: Diagnosis not present

## 2021-03-31 DIAGNOSIS — M47817 Spondylosis without myelopathy or radiculopathy, lumbosacral region: Secondary | ICD-10-CM | POA: Diagnosis not present

## 2021-03-31 DIAGNOSIS — G894 Chronic pain syndrome: Secondary | ICD-10-CM | POA: Diagnosis not present

## 2021-03-31 DIAGNOSIS — M79673 Pain in unspecified foot: Secondary | ICD-10-CM | POA: Diagnosis not present

## 2021-03-31 DIAGNOSIS — M5117 Intervertebral disc disorders with radiculopathy, lumbosacral region: Secondary | ICD-10-CM | POA: Diagnosis not present

## 2021-04-03 DIAGNOSIS — M25571 Pain in right ankle and joints of right foot: Secondary | ICD-10-CM | POA: Diagnosis not present

## 2021-04-12 DIAGNOSIS — M79671 Pain in right foot: Secondary | ICD-10-CM | POA: Diagnosis not present

## 2021-04-12 DIAGNOSIS — G5781 Other specified mononeuropathies of right lower limb: Secondary | ICD-10-CM | POA: Diagnosis not present

## 2021-04-12 DIAGNOSIS — M25571 Pain in right ankle and joints of right foot: Secondary | ICD-10-CM | POA: Diagnosis not present

## 2021-04-17 DIAGNOSIS — M545 Low back pain, unspecified: Secondary | ICD-10-CM | POA: Diagnosis not present

## 2021-04-17 DIAGNOSIS — M25531 Pain in right wrist: Secondary | ICD-10-CM | POA: Diagnosis not present

## 2021-04-17 DIAGNOSIS — M79671 Pain in right foot: Secondary | ICD-10-CM | POA: Diagnosis not present

## 2021-04-23 ENCOUNTER — Other Ambulatory Visit: Payer: Self-pay | Admitting: Infectious Diseases

## 2021-04-23 DIAGNOSIS — B20 Human immunodeficiency virus [HIV] disease: Secondary | ICD-10-CM

## 2021-05-05 ENCOUNTER — Other Ambulatory Visit: Payer: Self-pay | Admitting: Infectious Diseases

## 2021-05-05 DIAGNOSIS — K219 Gastro-esophageal reflux disease without esophagitis: Secondary | ICD-10-CM

## 2021-05-10 DIAGNOSIS — G43009 Migraine without aura, not intractable, without status migrainosus: Secondary | ICD-10-CM | POA: Diagnosis not present

## 2021-05-10 DIAGNOSIS — E7849 Other hyperlipidemia: Secondary | ICD-10-CM | POA: Diagnosis not present

## 2021-05-10 DIAGNOSIS — I10 Essential (primary) hypertension: Secondary | ICD-10-CM | POA: Diagnosis not present

## 2021-05-12 DIAGNOSIS — M79673 Pain in unspecified foot: Secondary | ICD-10-CM | POA: Diagnosis not present

## 2021-05-12 DIAGNOSIS — G894 Chronic pain syndrome: Secondary | ICD-10-CM | POA: Diagnosis not present

## 2021-05-12 DIAGNOSIS — M25539 Pain in unspecified wrist: Secondary | ICD-10-CM | POA: Diagnosis not present

## 2021-05-12 DIAGNOSIS — M47817 Spondylosis without myelopathy or radiculopathy, lumbosacral region: Secondary | ICD-10-CM | POA: Diagnosis not present

## 2021-05-17 ENCOUNTER — Other Ambulatory Visit: Payer: Self-pay

## 2021-05-17 DIAGNOSIS — M25531 Pain in right wrist: Secondary | ICD-10-CM | POA: Diagnosis not present

## 2021-05-17 DIAGNOSIS — B009 Herpesviral infection, unspecified: Secondary | ICD-10-CM

## 2021-05-17 MED ORDER — VALACYCLOVIR HCL 500 MG PO TABS: 500.0000 mg | ORAL_TABLET | Freq: Every day | ORAL | 0 refills | Status: DC

## 2021-05-24 ENCOUNTER — Other Ambulatory Visit: Payer: Self-pay | Admitting: Infectious Diseases

## 2021-05-24 DIAGNOSIS — M25531 Pain in right wrist: Secondary | ICD-10-CM | POA: Diagnosis not present

## 2021-05-24 DIAGNOSIS — B009 Herpesviral infection, unspecified: Secondary | ICD-10-CM

## 2021-05-26 DIAGNOSIS — M25531 Pain in right wrist: Secondary | ICD-10-CM | POA: Diagnosis not present

## 2021-05-30 ENCOUNTER — Other Ambulatory Visit: Payer: Self-pay

## 2021-05-30 ENCOUNTER — Encounter (HOSPITAL_BASED_OUTPATIENT_CLINIC_OR_DEPARTMENT_OTHER): Payer: Self-pay | Admitting: Orthopedic Surgery
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
## 2021-06-05 NOTE — Anesthesia Preprocedure Evaluation (Addendum)
Anesthesia Evaluation  Patient identified by MRN, date of birth, ID band Patient awake    Reviewed: Allergy & Precautions, NPO status , Patient's Chart, lab work & pertinent test results  Airway Mallampati: II  TM Distance: >3 FB Neck ROM: Full    Dental no notable dental hx. (+) Teeth Intact, Dental Advisory Given   Pulmonary asthma ,    Pulmonary exam normal breath sounds clear to auscultation       Cardiovascular Normal cardiovascular exam Rhythm:Regular Rate:Normal  HLD   Neuro/Psych  Headaches, PSYCHIATRIC DISORDERS Anxiety Depression Bipolar Disorder    GI/Hepatic Neg liver ROS, GERD  Medicated and Controlled,  Endo/Other  negative endocrine ROS  Renal/GU negative Renal ROS  negative genitourinary   Musculoskeletal negative musculoskeletal ROS (+)   Abdominal (+) + obese,   Peds  Hematology  (+) HIV,   Anesthesia Other Findings   Reproductive/Obstetrics                            Anesthesia Physical Anesthesia Plan  ASA: 3  Anesthesia Plan: MAC and Regional   Post-op Pain Management:  Regional for Post-op pain   Induction: Intravenous  PONV Risk Score and Plan: 2 and Propofol infusion, Treatment may vary due to age or medical condition, Midazolam, Ondansetron and Dexamethasone  Airway Management Planned: Natural Airway  Additional Equipment:   Intra-op Plan:   Post-operative Plan:   Informed Consent: I have reviewed the patients History and Physical, chart, labs and discussed the procedure including the risks, benefits and alternatives for the proposed anesthesia with the patient or authorized representative who has indicated his/her understanding and acceptance.     Dental advisory given  Plan Discussed with: CRNA  Anesthesia Plan Comments:         Anesthesia Quick Evaluation

## 2021-06-05 NOTE — Progress Notes (Signed)

## 2021-06-06 ENCOUNTER — Encounter (HOSPITAL_BASED_OUTPATIENT_CLINIC_OR_DEPARTMENT_OTHER)
Admission: RE | Disposition: A | Discharge status: Home / Self Care | Payer: Self-pay | Source: Ambulatory Visit | Attending: Orthopedic Surgery

## 2021-06-06 ENCOUNTER — Other Ambulatory Visit: Payer: Self-pay

## 2021-06-06 ENCOUNTER — Ambulatory Visit (HOSPITAL_BASED_OUTPATIENT_CLINIC_OR_DEPARTMENT_OTHER)
Admission: RE | Admit: 2021-06-06 | Discharge: 2021-06-06 | Disposition: A | Discharge status: Home / Self Care | Payer: Medicare HMO | Source: Ambulatory Visit | Attending: Orthopedic Surgery | Admitting: Orthopedic Surgery

## 2021-06-06 ENCOUNTER — Ambulatory Visit (HOSPITAL_BASED_OUTPATIENT_CLINIC_OR_DEPARTMENT_OTHER): Payer: Medicare HMO | Admitting: Anesthesiology

## 2021-06-06 ENCOUNTER — Encounter (HOSPITAL_BASED_OUTPATIENT_CLINIC_OR_DEPARTMENT_OTHER): Payer: Self-pay | Admitting: Orthopedic Surgery

## 2021-06-06 DIAGNOSIS — Z791 Long term (current) use of non-steroidal anti-inflammatories (NSAID): Secondary | ICD-10-CM | POA: Diagnosis not present

## 2021-06-06 DIAGNOSIS — M65831 Other synovitis and tenosynovitis, right forearm: Secondary | ICD-10-CM | POA: Diagnosis not present

## 2021-06-06 DIAGNOSIS — Z79891 Long term (current) use of opiate analgesic: Secondary | ICD-10-CM | POA: Diagnosis not present

## 2021-06-06 DIAGNOSIS — Z91040 Latex allergy status: Secondary | ICD-10-CM | POA: Diagnosis not present

## 2021-06-06 DIAGNOSIS — Z79899 Other long term (current) drug therapy: Secondary | ICD-10-CM | POA: Insufficient documentation

## 2021-06-06 DIAGNOSIS — S56511A Strain of other extensor muscle, fascia and tendon at forearm level, right arm, initial encounter: Secondary | ICD-10-CM | POA: Diagnosis not present

## 2021-06-06 DIAGNOSIS — S63591A Other specified sprain of right wrist, initial encounter: Secondary | ICD-10-CM | POA: Diagnosis not present

## 2021-06-06 DIAGNOSIS — R739 Hyperglycemia, unspecified: Secondary | ICD-10-CM | POA: Diagnosis not present

## 2021-06-06 DIAGNOSIS — K219 Gastro-esophageal reflux disease without esophagitis: Secondary | ICD-10-CM | POA: Diagnosis not present

## 2021-06-06 DIAGNOSIS — Z21 Asymptomatic human immunodeficiency virus [HIV] infection status: Secondary | ICD-10-CM | POA: Insufficient documentation

## 2021-06-06 DIAGNOSIS — M67441 Ganglion, right hand: Secondary | ICD-10-CM | POA: Diagnosis not present

## 2021-06-06 HISTORY — PX: WRIST ARTHROSCOPY WITH DEBRIDEMENT: SHX6194

## 2021-06-06 SURGERY — WRIST ARTHROSCOPY WITH DEBRIDEMENT
Anesthesia: Monitor Anesthesia Care | Site: Wrist | Laterality: Right

## 2021-06-06 MED ORDER — ACETAMINOPHEN 500 MG PO TABS: 1000.0000 mg | ORAL_TABLET | Freq: Once | ORAL | Status: DC

## 2021-06-06 MED ORDER — FENTANYL CITRATE (PF) 100 MCG/2ML IJ SOLN
100.0000 ug | Freq: Once | INTRAMUSCULAR | Status: AC
  Administered 2021-06-06: 75 ug via INTRAVENOUS

## 2021-06-06 MED ORDER — ONDANSETRON 4 MG PO TBDP: 4.0000 mg | ORAL_TABLET | Freq: Three times a day (TID) | ORAL | 0 refills | Status: DC | PRN

## 2021-06-06 MED ORDER — PROPOFOL 500 MG/50ML IV EMUL
INTRAVENOUS | Status: AC
  Filled 2021-06-06: qty 50

## 2021-06-06 MED ORDER — LIDOCAINE HCL (CARDIAC) PF 100 MG/5ML IV SOSY
PREFILLED_SYRINGE | INTRAVENOUS | Status: DC | PRN
  Administered 2021-06-06: 50 mg via INTRAVENOUS

## 2021-06-06 MED ORDER — PROPOFOL 10 MG/ML IV BOLUS
INTRAVENOUS | Status: DC | PRN
  Administered 2021-06-06: 20 mg via INTRAVENOUS

## 2021-06-06 MED ORDER — HYDROCODONE-ACETAMINOPHEN 10-325 MG PO TABS: 1.0000 | ORAL_TABLET | Freq: Four times a day (QID) | ORAL | 0 refills | Status: DC | PRN

## 2021-06-06 MED ORDER — SODIUM CHLORIDE 0.9 % IR SOLN
Status: DC | PRN
  Administered 2021-06-06: 1

## 2021-06-06 MED ORDER — ONDANSETRON HCL 4 MG/2ML IJ SOLN
INTRAMUSCULAR | Status: AC
  Filled 2021-06-06: qty 2

## 2021-06-06 MED ORDER — MIDAZOLAM HCL 2 MG/2ML IJ SOLN
2.0000 mg | Freq: Once | INTRAMUSCULAR | Status: AC
  Administered 2021-06-06: 2 mg via INTRAVENOUS

## 2021-06-06 MED ORDER — FENTANYL CITRATE (PF) 100 MCG/2ML IJ SOLN
INTRAMUSCULAR | Status: DC | PRN
  Administered 2021-06-06 (×2): 25 ug via INTRAVENOUS

## 2021-06-06 MED ORDER — DEXAMETHASONE SODIUM PHOSPHATE 10 MG/ML IJ SOLN
INTRAMUSCULAR | Status: AC
  Filled 2021-06-06: qty 1

## 2021-06-06 MED ORDER — FENTANYL CITRATE (PF) 100 MCG/2ML IJ SOLN
INTRAMUSCULAR | Status: AC
  Filled 2021-06-06: qty 2

## 2021-06-06 MED ORDER — CEFAZOLIN SODIUM-DEXTROSE 2-4 GM/100ML-% IV SOLN
2.0000 g | INTRAVENOUS | Status: AC
  Administered 2021-06-06: 2 g via INTRAVENOUS

## 2021-06-06 MED ORDER — DEXAMETHASONE SODIUM PHOSPHATE 10 MG/ML IJ SOLN
8.0000 mg | Freq: Once | INTRAMUSCULAR | Status: AC
  Administered 2021-06-06: 8 mg via INTRAVENOUS

## 2021-06-06 MED ORDER — MELOXICAM 15 MG PO TABS: 15.0000 mg | ORAL_TABLET | Freq: Every day | ORAL | 0 refills | Status: AC | PRN

## 2021-06-06 MED ORDER — POVIDONE-IODINE 10 % EX SWAB
2.0000 "application " | Freq: Once | CUTANEOUS | Status: AC
  Administered 2021-06-06: 2 via TOPICAL

## 2021-06-06 MED ORDER — LIDOCAINE 2% (20 MG/ML) 5 ML SYRINGE
INTRAMUSCULAR | Status: AC
  Filled 2021-06-06: qty 5

## 2021-06-06 MED ORDER — ACETAMINOPHEN 500 MG PO TABS
1000.0000 mg | ORAL_TABLET | Freq: Once | ORAL | Status: AC
  Administered 2021-06-06: 1000 mg via ORAL

## 2021-06-06 MED ORDER — CEFAZOLIN SODIUM-DEXTROSE 2-4 GM/100ML-% IV SOLN
INTRAVENOUS | Status: AC
  Filled 2021-06-06: qty 100

## 2021-06-06 MED ORDER — LACTATED RINGERS IV SOLN: INTRAVENOUS | Status: DC

## 2021-06-06 MED ORDER — ROPIVACAINE HCL 5 MG/ML IJ SOLN
INTRAMUSCULAR | Status: DC | PRN
  Administered 2021-06-06: 25 mL via PERINEURAL

## 2021-06-06 MED ORDER — FENTANYL CITRATE (PF) 100 MCG/2ML IJ SOLN: 25.0000 ug | INTRAMUSCULAR | Status: DC | PRN

## 2021-06-06 MED ORDER — DEXAMETHASONE SODIUM PHOSPHATE 10 MG/ML IJ SOLN
INTRAMUSCULAR | Status: DC | PRN
  Administered 2021-06-06: 5 mg

## 2021-06-06 MED ORDER — MIDAZOLAM HCL 2 MG/2ML IJ SOLN
INTRAMUSCULAR | Status: AC
  Filled 2021-06-06: qty 2

## 2021-06-06 MED ORDER — MIDAZOLAM HCL 5 MG/5ML IJ SOLN
INTRAMUSCULAR | Status: DC | PRN
  Administered 2021-06-06: 1 mg via INTRAVENOUS

## 2021-06-06 MED ORDER — ACETAMINOPHEN 500 MG PO TABS
ORAL_TABLET | ORAL | Status: AC
  Filled 2021-06-06: qty 2

## 2021-06-06 MED ORDER — ONDANSETRON HCL 4 MG/2ML IJ SOLN
INTRAMUSCULAR | Status: DC | PRN
  Administered 2021-06-06: 4 mg via INTRAVENOUS

## 2021-06-06 MED ORDER — PROPOFOL 500 MG/50ML IV EMUL
INTRAVENOUS | Status: DC | PRN
  Administered 2021-06-06: 75 ug/kg/min via INTRAVENOUS

## 2021-06-06 SURGICAL SUPPLY — 63 items
APL PRP STRL LF DISP 70% ISPRP (MISCELLANEOUS) ×1
BAG DECANTER FOR FLEXI CONT (MISCELLANEOUS) IMPLANT
BLADE MINI RND TIP GREEN BEAV (BLADE) IMPLANT
BLADE SURG 15 STRL LF DISP TIS (BLADE) ×1 IMPLANT
BLADE SURG 15 STRL SS (BLADE) ×2
BNDG CMPR 9X4 STRL LF SNTH (GAUZE/BANDAGES/DRESSINGS) ×1
BNDG ELASTIC 4X5.8 VLCR STR LF (GAUZE/BANDAGES/DRESSINGS) ×2 IMPLANT
BNDG ESMARK 4X9 LF (GAUZE/BANDAGES/DRESSINGS) ×2 IMPLANT
BNDG GAUZE ELAST 4 BULKY (GAUZE/BANDAGES/DRESSINGS) IMPLANT
CANISTER SUCT 1200ML W/VALVE (MISCELLANEOUS) IMPLANT
CHLORAPREP W/TINT 26 (MISCELLANEOUS) ×2 IMPLANT
CORD BIPOLAR FORCEPS 12FT (ELECTRODE) IMPLANT
COVER BACK TABLE 60X90IN (DRAPES) ×2 IMPLANT
CUFF TOURN SGL QUICK 18X4 (TOURNIQUET CUFF) IMPLANT
DECANTER SPIKE VIAL GLASS SM (MISCELLANEOUS) IMPLANT
DRAPE EXTREMITY T 121X128X90 (DISPOSABLE) ×2 IMPLANT
DRAPE SURG 17X23 STRL (DRAPES) ×2 IMPLANT
GAUZE 4X4 16PLY ~~LOC~~+RFID DBL (SPONGE) IMPLANT
GAUZE SPONGE 4X4 12PLY STRL (GAUZE/BANDAGES/DRESSINGS) ×2 IMPLANT
GAUZE XEROFORM 1X8 LF (GAUZE/BANDAGES/DRESSINGS) IMPLANT
GLOVE SRG 8 PF TXTR STRL LF DI (GLOVE) ×1 IMPLANT
GLOVE SURG ENC MOIS LTX SZ7.5 (GLOVE) ×2 IMPLANT
GLOVE SURG UNDER POLY LF SZ8 (GLOVE) ×2
GOWN STRL REUS W/ TWL LRG LVL3 (GOWN DISPOSABLE) ×2 IMPLANT
GOWN STRL REUS W/ TWL XL LVL3 (GOWN DISPOSABLE) ×1 IMPLANT
GOWN STRL REUS W/TWL LRG LVL3 (GOWN DISPOSABLE) ×4
GOWN STRL REUS W/TWL XL LVL3 (GOWN DISPOSABLE) ×2
IV NS IRRIG 3000ML ARTHROMATIC (IV SOLUTION) ×2 IMPLANT
IV SET EXT 30 76VOL 4 MALE LL (IV SETS) ×2 IMPLANT
NDL EPIDURAL TUOHY 20GX3.5 (NEEDLE) IMPLANT
NDL SAFETY ECLIPSE 18X1.5 (NEEDLE) ×2 IMPLANT
NDL SPNL 25GX3.5 QUINCKE BL (NEEDLE) IMPLANT
NEEDLE HYPO 18GX1.5 SHARP (NEEDLE) ×4
NEEDLE HYPO 22GX1.5 SAFETY (NEEDLE) ×2 IMPLANT
NEEDLE SPNL 25GX3.5 QUINCKE BL (NEEDLE) IMPLANT
NEEDLE TUOHY 20GX3.5 (NEEDLE) IMPLANT
NS IRRIG 1000ML POUR BTL (IV SOLUTION) IMPLANT
PACK BASIN DAY SURGERY FS (CUSTOM PROCEDURE TRAY) ×2 IMPLANT
PAD CAST 4YDX4 CTTN HI CHSV (CAST SUPPLIES) ×1 IMPLANT
PADDING CAST ABS 4INX4YD NS (CAST SUPPLIES) ×1
PADDING CAST ABS COTTON 4X4 ST (CAST SUPPLIES) ×1 IMPLANT
PADDING CAST COTTON 4X4 STRL (CAST SUPPLIES) ×2
SET SM JOINT TUBING/CANN (CANNULA) IMPLANT
SHAVER DISSECTOR 3.0 (BURR) ×2 IMPLANT
SHEET MEDIUM DRAPE 40X70 STRL (DRAPES) ×2 IMPLANT
SPLINT PLASTER CAST XFAST 4X15 (CAST SUPPLIES) ×15 IMPLANT
SPLINT PLASTER XTRA FAST SET 4 (CAST SUPPLIES) ×15
SPLINT UNIVERSAL RIGHT (SOFTGOODS) ×1 IMPLANT
STOCKINETTE 4X48 STRL (DRAPES) ×2 IMPLANT
SUT ETHILON 3 0 PS 1 (SUTURE) ×1 IMPLANT
SUT ETHILON 4 0 PS 2 18 (SUTURE) IMPLANT
SUT MNCRL AB 3-0 PS2 18 (SUTURE) ×1 IMPLANT
SUT MON AB 2-0 CT1 36 (SUTURE) ×1 IMPLANT
SUT PDS AB 2-0 CT2 27 (SUTURE) IMPLANT
SUT VIC AB 0 SH 27 (SUTURE) ×1 IMPLANT
SYR 10ML LL (SYRINGE) ×2 IMPLANT
TOWEL GREEN STERILE FF (TOWEL DISPOSABLE) ×2 IMPLANT
TRAP DIGIT (INSTRUMENTS) IMPLANT
TRAP FINGER LRG (INSTRUMENTS) IMPLANT
TUBE CONNECTING 20X1/4 (TUBING) ×2 IMPLANT
UNDERPAD 30X36 HEAVY ABSORB (UNDERPADS AND DIAPERS) ×2 IMPLANT
WAND 1.5 MICROBLATOR (SURGICAL WAND) IMPLANT
WATER STERILE IRR 1000ML POUR (IV SOLUTION) ×2 IMPLANT

## 2021-06-06 NOTE — Interval H&P Note (Signed)
History and Physical Interval Note:  06/06/2021 8:46 AM  Danielle [name]  has presented today for surgery, with the diagnosis of RIGHT WRIST TFCC TEAR.  The various methods of treatment have been discussed with the patient and family. After consideration of risks, benefits and other options for treatment, the patient has consented to  Procedure(s): ARTHROSCOPY WRIST, TFCC, SYNOVECTOMY, (Right) as a surgical intervention.  The patient's history has been reviewed, patient examined, no change in status, stable for surgery.  I have reviewed the patient's chart and labs.  Questions were answered to the patient's satisfaction.     Renette Butters

## 2021-06-06 NOTE — Transfer of Care (Signed)
Immediate Anesthesia Transfer of Care Note  Patient: Danielle [name]  Procedure(s) Performed: ARTHROSCOPY WRIST, DEBRIDEMENT TFCC, SYNOVECTOMY AND OPEN EXTENSO CARPI ULNARIS  REPAIR (Right: Wrist)  Patient Location: PACU  Anesthesia Type:MAC  Level of Consciousness: awake, alert  and oriented  Airway & Oxygen Therapy: Patient Spontanous Breathing and Patient connected to nasal cannula oxygen  Post-op Assessment: Report given to RN and Post -op Vital signs reviewed and stable  Post vital signs: Reviewed and stable  Last Vitals:  Vitals Value Taken Time  BP 122/96 06/06/21 1047  Temp    Pulse 98 06/06/21 1049  Resp 20 06/06/21 1049  SpO2 94 % 06/06/21 1049  Vitals shown include unvalidated device data.  Last Pain:  Vitals:   06/06/21 0718  TempSrc: Oral  PainSc: 7       Patients Stated Pain Goal: 10 (11/28/20 4825)  Complications: No notable events documented.

## 2021-06-06 NOTE — Anesthesia Procedure Notes (Signed)
Anesthesia Regional Block: Supraclavicular block   Pre-Anesthetic Checklist: , timeout performed,  Correct Patient, Correct Site, Correct Laterality,  Correct Procedure, Correct Position, site marked,  Risks and benefits discussed,  Surgical consent,  Pre-op evaluation,  At surgeon's request and post-op pain management  Laterality: Right  Prep: Maximum Sterile Barrier Precautions used, chloraprep       Needles:  Injection technique: Single-shot  Needle Type: Echogenic Stimulator Needle     Needle Length: 9cm  Needle Gauge: 22     Additional Needles:   Procedures:,,,, ultrasound used (permanent image in chart),,    Narrative:  Start time: 06/06/2021 8:00 AM End time: 06/06/2021 8:09 AM Injection made incrementally with aspirations every 5 mL.  Performed by: Personally  Anesthesiologist: Freddrick March, MD  Additional Notes: Monitors applied. No increased pain on injection. No increased resistance to injection. Injection made in 5cc increments. Good needle visualization. Patient tolerated procedure well.

## 2021-06-06 NOTE — Progress Notes (Signed)
Assisted Dr. Woodrum with right, ultrasound guided, supraclavicular block. Side rails up, monitors on throughout procedure. See vital signs in flow sheet. Tolerated Procedure well. 

## 2021-06-06 NOTE — Discharge Instructions (Addendum)
POST-OPERATIVE OPIOID TAPER INSTRUCTIONS: It is important to wean off of your opioid medication as soon as possible. If you do not need pain medication after your surgery it is ok to stop day one. Opioids include: Codeine, Hydrocodone(Norco, Vicodin), Oxycodone(Percocet, oxycontin) and hydromorphone amongst others.  Long term and even short term use of opiods can cause: Increased pain response Dependence Constipation Depression Respiratory depression And more.  Withdrawal symptoms can include Flu like symptoms Nausea, vomiting And more Techniques to manage these symptoms Hydrate well Eat regular healthy meals Stay active Use relaxation techniques(deep breathing, meditating, yoga) Do Not substitute Alcohol to help with tapering If you have been on opioids for less than two weeks and do not have pain than it is ok to stop all together.  Plan to wean off of opioids This plan should start within one week post op of your joint replacement. Maintain the same interval or time between taking each dose and first decrease the dose.  Cut the total daily intake of opioids by one tablet each day Next start to increase the time between doses. The last dose that should be eliminated is the evening dose.            Post Anesthesia Home Care Instructions  Activity: Get plenty of rest for the remainder of the day. A responsible individual must stay with you for 24 hours following the procedure.  For the next 24 hours, DO NOT: -Drive a car -Paediatric nurse -Drink alcoholic beverages -Take any medication unless instructed by your physician -Make any legal decisions or sign important papers.  Meals: Start with liquid foods such as gelatin or soup. Progress to regular foods as tolerated. Avoid greasy, spicy, heavy foods. If nausea and/or vomiting occur, drink only clear liquids until the nausea and/or vomiting subsides. Call your physician if vomiting continues.  Special  Instructions/Symptoms: Your throat may feel dry or sore from the anesthesia or the breathing tube placed in your throat during surgery. If this causes discomfort, gargle with warm salt water. The discomfort should disappear within 24 hours.  If you had a scopolamine patch placed behind your ear for the management of post- operative nausea and/or vomiting:  1. The medication in the patch is effective for 72 hours, after which it should be removed.  Wrap patch in a tissue and discard in the trash. Wash hands thoroughly with soap and water. 2. You may remove the patch earlier than 72 hours if you experience unpleasant side effects which may include dry mouth, dizziness or visual disturbances. 3. Avoid touching the patch. Wash your hands with soap and water after contact with the patch.    Regional Anesthesia Blocks  1. Numbness or the inability to move the "blocked" extremity may last from 3-48 hours after placement. The length of time depends on the medication injected and your individual response to the medication. If the numbness is not going away after 48 hours, call your surgeon.  2. The extremity that is blocked will need to be protected until the numbness is gone and the  Strength has returned. Because you cannot feel it, you will need to take extra care to avoid injury. Because it may be weak, you may have difficulty moving it or using it. You may not know what position it is in without looking at it while the block is in effect.  3. For blocks in the legs and feet, returning to weight bearing and walking needs to be done carefully. You will need to  wait until the numbness is entirely gone and the strength has returned. You should be able to move your leg and foot normally before you try and bear weight or walk. You will need someone to be with you when you first try to ensure you do not fall and possibly risk injury.  4. Bruising and tenderness at the needle site are common side effects and  will resolve in a few days.  5. Persistent numbness or new problems with movement should be communicated to the surgeon or the [facility] [phone] [place] [phone]).   Next dose of tylenol can be taken at 130pm today if needed.

## 2021-06-06 NOTE — Anesthesia Postprocedure Evaluation (Signed)
Anesthesia Post Note  Patient: Danielle [name]  Procedure(s) Performed: ARTHROSCOPY WRIST, DEBRIDEMENT TFCC, SYNOVECTOMY AND  EXTENSO CARPI ULNARIS  REPAIR (Right: Wrist)     Patient location during evaluation: PACU Anesthesia Type: Regional and MAC Level of consciousness: awake and alert Pain management: pain level controlled Vital Signs Assessment: post-procedure vital signs reviewed and stable Respiratory status: spontaneous breathing, nonlabored ventilation, respiratory function stable and patient connected to nasal cannula oxygen Cardiovascular status: stable and blood pressure returned to baseline Postop Assessment: no apparent nausea or vomiting Anesthetic complications: no   No notable events documented.  Last Vitals:  Vitals:   06/06/21 1115 06/06/21 1154  BP: 114/74 (!) 109/92  Pulse: 75 74  Resp: (!) 22 14  Temp:  36.7 C  SpO2: 96% 94%    Last Pain:  Vitals:   06/06/21 1154  TempSrc:   PainSc: 0-No pain                 Jeyla Bulger L Samyah Bilbo

## 2021-06-06 NOTE — Interval H&P Note (Signed)
History and Physical Interval Note:  06/06/2021 7:04 AM  Danielle [name]  has presented today for surgery, with the diagnosis of RIGHT WRIST TFCC TEAR.  The various methods of treatment have been discussed with the patient and family. After consideration of risks, benefits and other options for treatment, the patient has consented to  Procedure(s): ARTHROSCOPY WRIST, TFCC, SYNOVECTOMY, (Right) as a surgical intervention.  The patient's history has been reviewed, patient examined, no change in status, stable for surgery.  I have reviewed the patient's chart and labs.  Questions were answered to the patient's satisfaction.     Renette Butters

## 2021-06-06 NOTE — Op Note (Signed)
06/06/2021  12:01 PM  PATIENT:  Danielle [name]    PRE-OPERATIVE DIAGNOSIS:  RIGHT WRIST TFCC TEAR  POST-OPERATIVE DIAGNOSIS:  Same  PROCEDURE:  ARTHROSCOPY WRIST, DEBRIDEMENT TFCC, SYNOVECTOMY AND  EXTENSO CARPI ULNARIS  REPAIR  SURGEON:  Renette Butters, MD  ASSISTANT: Aggie Moats, PA-C, he was present and scrubbed throughout the case, critical for completion in a timely fashion, and for retraction, instrumentation, and closure.   ANESTHESIA:   gen  PREOPERATIVE INDICATIONS:  Danielle [name] is a  50 y.o. female with a diagnosis of RIGHT WRIST TFCC TEAR who failed conservative measures and elected for surgical management.    The risks benefits and alternatives were discussed with the patient preoperatively including but not limited to the risks of infection, bleeding, nerve injury, cardiopulmonary complications, the need for revision surgery, among others, and the patient was willing to proceed.  OPERATIVE IMPLANTS: none  OPERATIVE FINDINGS: partial ECU tear, TFCC tear  BLOOD LOSS: min  COMPLICATIONS: none  TOURNIQUET TIME: 69min  OPERATIVE PROCEDURE:  Patient was identified in the preoperative holding area and site was marked by me She was transported to the operating theater and placed on the table in supine position taking care to pad all bony prominences. After a preincinduction time out anesthesia was induced. The right upper extremity was prepped and draped in normal sterile fashion and a pre-incision timeout was performed. She received ancef for preoperative antibiotics.   The right upper extremity Esmarch was applied and tourniquet was inflated she was placed in the arm distractor and I made a dorsal incision through skin only I spread down to the wrist capsule this was at the 3 4 interval I then used the trocar to place a scope into the radiocarpal joint.   I then used a spinal needle and located the TFCC portal.  I made a skin incision here and bluntly inserted a  trocar into the joint and then inserted the shaver and probe I was able to examine her TFCC there was a radial tear at the base of the TFCC was stable and did not require repair I debrided this tear.   Moved all arthroscopic equipment I extended the ulnar incision proximally and dissected to her her dorsal compartment and identified her ECU tendon I maintained a dorsal sheath for repair.  Tendon was not subluxating I debrided synovium central to the debrided synovitis from this tendon.  I then repaired the subsheath and sheath.  Thoroughly irrigated and closed her skin in layers postoperative plan  POST OPERATIVE PLAN: Mobilize for DVT prophylaxis splint full-time

## 2021-06-07 ENCOUNTER — Telehealth: Payer: Self-pay

## 2021-06-07 NOTE — Telephone Encounter (Signed)
Patient called office today to see if she could do a virtual appointment on 10/4.  Recently had surgery and does not think she can make it for in person appointment. No recent labs done. Last labs were done on 09/27/20. Will forward message to MD .  Leatrice Jewels, RMA

## 2021-06-08 ENCOUNTER — Encounter (HOSPITAL_BASED_OUTPATIENT_CLINIC_OR_DEPARTMENT_OTHER): Payer: Self-pay | Admitting: Orthopedic Surgery

## 2021-06-12 DIAGNOSIS — M79671 Pain in right foot: Secondary | ICD-10-CM | POA: Diagnosis not present

## 2021-06-12 DIAGNOSIS — M25531 Pain in right wrist: Secondary | ICD-10-CM | POA: Diagnosis not present

## 2021-06-12 DIAGNOSIS — M545 Low back pain, unspecified: Secondary | ICD-10-CM | POA: Diagnosis not present

## 2021-06-13 ENCOUNTER — Other Ambulatory Visit: Payer: Self-pay

## 2021-06-13 ENCOUNTER — Encounter: Payer: Self-pay | Admitting: Infectious Diseases

## 2021-06-13 ENCOUNTER — Telehealth (INDEPENDENT_AMBULATORY_CARE_PROVIDER_SITE_OTHER): Payer: Medicare HMO | Admitting: Infectious Diseases

## 2021-06-13 DIAGNOSIS — Z79899 Other long term (current) drug therapy: Secondary | ICD-10-CM

## 2021-06-13 DIAGNOSIS — A630 Anogenital (venereal) warts: Secondary | ICD-10-CM

## 2021-06-13 DIAGNOSIS — B20 Human immunodeficiency virus [HIV] disease: Secondary | ICD-10-CM

## 2021-06-13 DIAGNOSIS — D249 Benign neoplasm of unspecified breast: Secondary | ICD-10-CM

## 2021-06-13 DIAGNOSIS — G8929 Other chronic pain: Secondary | ICD-10-CM | POA: Diagnosis not present

## 2021-06-13 DIAGNOSIS — Z113 Encounter for screening for infections with a predominantly sexual mode of transmission: Secondary | ICD-10-CM | POA: Diagnosis not present

## 2021-06-13 DIAGNOSIS — M25571 Pain in right ankle and joints of right foot: Secondary | ICD-10-CM

## 2021-06-13 NOTE — Assessment & Plan Note (Signed)
Doing well on her ART Need to check her labs Needs to get COVID and flu vax.  Change her husband's truvada to descovey.  rtc in 6 months.

## 2021-06-13 NOTE — Assessment & Plan Note (Signed)
Will repeat her mammo Jan 2023.

## 2021-06-13 NOTE — Assessment & Plan Note (Signed)
Encouraged her to get PAP.
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
## 2021-06-13 NOTE — Assessment & Plan Note (Signed)
Is having radiating pain.  Will have her f/u with her PCP.  Consider Neurontin?

## 2021-06-19 DIAGNOSIS — S63591A Other specified sprain of right wrist, initial encounter: Secondary | ICD-10-CM | POA: Diagnosis not present

## 2021-06-19 DIAGNOSIS — M65831 Other synovitis and tenosynovitis, right forearm: Secondary | ICD-10-CM | POA: Diagnosis not present

## 2021-06-23 DIAGNOSIS — M47817 Spondylosis without myelopathy or radiculopathy, lumbosacral region: Secondary | ICD-10-CM | POA: Diagnosis not present

## 2021-06-23 DIAGNOSIS — G90529 Complex regional pain syndrome I of unspecified lower limb: Secondary | ICD-10-CM | POA: Diagnosis not present

## 2021-06-23 DIAGNOSIS — M5117 Intervertebral disc disorders with radiculopathy, lumbosacral region: Secondary | ICD-10-CM | POA: Diagnosis not present

## 2021-06-23 DIAGNOSIS — G894 Chronic pain syndrome: Secondary | ICD-10-CM | POA: Diagnosis not present

## 2021-06-23 DIAGNOSIS — Z79891 Long term (current) use of opiate analgesic: Secondary | ICD-10-CM | POA: Diagnosis not present

## 2021-07-03 DIAGNOSIS — M25631 Stiffness of right wrist, not elsewhere classified: Secondary | ICD-10-CM | POA: Diagnosis not present

## 2021-07-03 DIAGNOSIS — S6981XD Other specified injuries of right wrist, hand and finger(s), subsequent encounter: Secondary | ICD-10-CM | POA: Diagnosis not present

## 2021-07-03 DIAGNOSIS — M25531 Pain in right wrist: Secondary | ICD-10-CM | POA: Diagnosis not present

## 2021-07-07 DIAGNOSIS — S6981XD Other specified injuries of right wrist, hand and finger(s), subsequent encounter: Secondary | ICD-10-CM | POA: Diagnosis not present

## 2021-07-07 DIAGNOSIS — M25631 Stiffness of right wrist, not elsewhere classified: Secondary | ICD-10-CM | POA: Diagnosis not present

## 2021-07-07 DIAGNOSIS — M25531 Pain in right wrist: Secondary | ICD-10-CM | POA: Diagnosis not present

## 2021-07-10 DIAGNOSIS — G43009 Migraine without aura, not intractable, without status migrainosus: Secondary | ICD-10-CM | POA: Diagnosis not present

## 2021-07-10 DIAGNOSIS — E7849 Other hyperlipidemia: Secondary | ICD-10-CM | POA: Diagnosis not present

## 2021-07-10 DIAGNOSIS — S6981XD Other specified injuries of right wrist, hand and finger(s), subsequent encounter: Secondary | ICD-10-CM | POA: Diagnosis not present

## 2021-07-10 DIAGNOSIS — I10 Essential (primary) hypertension: Secondary | ICD-10-CM | POA: Diagnosis not present

## 2021-07-10 DIAGNOSIS — M25631 Stiffness of right wrist, not elsewhere classified: Secondary | ICD-10-CM | POA: Diagnosis not present

## 2021-07-10 DIAGNOSIS — M25531 Pain in right wrist: Secondary | ICD-10-CM | POA: Diagnosis not present

## 2021-07-17 ENCOUNTER — Telehealth: Payer: Self-pay

## 2021-07-17 NOTE — Telephone Encounter (Signed)
Patient called wanted to leave a message to either Dr. Johnnye Sima or a nurse, said her PCP told her to reach out to [place] to see about any assistance with housing or rent. Told her I would send a message to the nurse and [facility] to see if they can assist her. Will leave a message in [facility] to reach out to patient. Patient contact number is [phone]

## 2021-07-21 DIAGNOSIS — M25531 Pain in right wrist: Secondary | ICD-10-CM | POA: Diagnosis not present

## 2021-07-21 DIAGNOSIS — M67441 Ganglion, right hand: Secondary | ICD-10-CM | POA: Diagnosis not present

## 2021-07-21 DIAGNOSIS — M25631 Stiffness of right wrist, not elsewhere classified: Secondary | ICD-10-CM | POA: Diagnosis not present

## 2021-07-21 DIAGNOSIS — S6981XD Other specified injuries of right wrist, hand and finger(s), subsequent encounter: Secondary | ICD-10-CM | POA: Diagnosis not present

## 2021-07-24 DIAGNOSIS — M5117 Intervertebral disc disorders with radiculopathy, lumbosacral region: Secondary | ICD-10-CM | POA: Diagnosis not present

## 2021-07-24 DIAGNOSIS — G90529 Complex regional pain syndrome I of unspecified lower limb: Secondary | ICD-10-CM | POA: Diagnosis not present

## 2021-07-24 DIAGNOSIS — M47817 Spondylosis without myelopathy or radiculopathy, lumbosacral region: Secondary | ICD-10-CM | POA: Diagnosis not present

## 2021-07-24 DIAGNOSIS — M25539 Pain in unspecified wrist: Secondary | ICD-10-CM | POA: Diagnosis not present

## 2021-07-26 DIAGNOSIS — M65831 Other synovitis and tenosynovitis, right forearm: Secondary | ICD-10-CM | POA: Diagnosis not present

## 2021-07-28 DIAGNOSIS — S6981XD Other specified injuries of right wrist, hand and finger(s), subsequent encounter: Secondary | ICD-10-CM | POA: Diagnosis not present

## 2021-07-28 DIAGNOSIS — M25631 Stiffness of right wrist, not elsewhere classified: Secondary | ICD-10-CM | POA: Diagnosis not present

## 2021-07-28 DIAGNOSIS — M25531 Pain in right wrist: Secondary | ICD-10-CM | POA: Diagnosis not present

## 2021-07-30 ENCOUNTER — Other Ambulatory Visit: Payer: Self-pay | Admitting: Infectious Diseases

## 2021-07-30 DIAGNOSIS — B009 Herpesviral infection, unspecified: Secondary | ICD-10-CM

## 2021-08-01 DIAGNOSIS — M25631 Stiffness of right wrist, not elsewhere classified: Secondary | ICD-10-CM | POA: Diagnosis not present

## 2021-08-01 DIAGNOSIS — S6981XD Other specified injuries of right wrist, hand and finger(s), subsequent encounter: Secondary | ICD-10-CM | POA: Diagnosis not present

## 2021-08-01 DIAGNOSIS — R52 Pain, unspecified: Secondary | ICD-10-CM | POA: Diagnosis not present

## 2021-08-02 ENCOUNTER — Other Ambulatory Visit: Payer: Self-pay | Admitting: Infectious Diseases

## 2021-08-02 DIAGNOSIS — B20 Human immunodeficiency virus [HIV] disease: Secondary | ICD-10-CM

## 2021-08-02 NOTE — Telephone Encounter (Signed)
Okay to refill? 

## 2021-08-04 DIAGNOSIS — S6981XD Other specified injuries of right wrist, hand and finger(s), subsequent encounter: Secondary | ICD-10-CM | POA: Diagnosis not present

## 2021-08-04 DIAGNOSIS — M25531 Pain in right wrist: Secondary | ICD-10-CM | POA: Diagnosis not present

## 2021-08-04 DIAGNOSIS — M25631 Stiffness of right wrist, not elsewhere classified: Secondary | ICD-10-CM | POA: Diagnosis not present

## 2021-08-07 ENCOUNTER — Other Ambulatory Visit: Payer: Self-pay | Admitting: Infectious Diseases

## 2021-08-09 DIAGNOSIS — G43009 Migraine without aura, not intractable, without status migrainosus: Secondary | ICD-10-CM | POA: Diagnosis not present

## 2021-08-09 DIAGNOSIS — E7849 Other hyperlipidemia: Secondary | ICD-10-CM | POA: Diagnosis not present

## 2021-08-09 DIAGNOSIS — I10 Essential (primary) hypertension: Secondary | ICD-10-CM | POA: Diagnosis not present

## 2021-08-09 DIAGNOSIS — M25531 Pain in right wrist: Secondary | ICD-10-CM | POA: Diagnosis not present

## 2021-08-09 DIAGNOSIS — M25631 Stiffness of right wrist, not elsewhere classified: Secondary | ICD-10-CM | POA: Diagnosis not present

## 2021-08-09 DIAGNOSIS — S6981XD Other specified injuries of right wrist, hand and finger(s), subsequent encounter: Secondary | ICD-10-CM | POA: Diagnosis not present

## 2021-08-11 DIAGNOSIS — M25631 Stiffness of right wrist, not elsewhere classified: Secondary | ICD-10-CM | POA: Diagnosis not present

## 2021-08-11 DIAGNOSIS — S6981XD Other specified injuries of right wrist, hand and finger(s), subsequent encounter: Secondary | ICD-10-CM | POA: Diagnosis not present

## 2021-08-11 DIAGNOSIS — M25531 Pain in right wrist: Secondary | ICD-10-CM | POA: Diagnosis not present

## 2021-08-15 DIAGNOSIS — M25631 Stiffness of right wrist, not elsewhere classified: Secondary | ICD-10-CM | POA: Diagnosis not present

## 2021-08-15 DIAGNOSIS — S6981XD Other specified injuries of right wrist, hand and finger(s), subsequent encounter: Secondary | ICD-10-CM | POA: Diagnosis not present

## 2021-08-15 DIAGNOSIS — M25531 Pain in right wrist: Secondary | ICD-10-CM | POA: Diagnosis not present

## 2021-08-18 DIAGNOSIS — S6981XD Other specified injuries of right wrist, hand and finger(s), subsequent encounter: Secondary | ICD-10-CM | POA: Diagnosis not present

## 2021-08-18 DIAGNOSIS — M25631 Stiffness of right wrist, not elsewhere classified: Secondary | ICD-10-CM | POA: Diagnosis not present

## 2021-08-18 DIAGNOSIS — M25531 Pain in right wrist: Secondary | ICD-10-CM | POA: Diagnosis not present

## 2021-08-22 DIAGNOSIS — S6981XD Other specified injuries of right wrist, hand and finger(s), subsequent encounter: Secondary | ICD-10-CM | POA: Diagnosis not present

## 2021-08-22 DIAGNOSIS — M25531 Pain in right wrist: Secondary | ICD-10-CM | POA: Diagnosis not present

## 2021-08-22 DIAGNOSIS — M25631 Stiffness of right wrist, not elsewhere classified: Secondary | ICD-10-CM | POA: Diagnosis not present

## 2021-08-23 DIAGNOSIS — M65831 Other synovitis and tenosynovitis, right forearm: Secondary | ICD-10-CM | POA: Diagnosis not present

## 2021-08-28 ENCOUNTER — Ambulatory Visit: Payer: Medicare HMO

## 2021-08-28 DIAGNOSIS — G90529 Complex regional pain syndrome I of unspecified lower limb: Secondary | ICD-10-CM | POA: Diagnosis not present

## 2021-08-28 DIAGNOSIS — M79673 Pain in unspecified foot: Secondary | ICD-10-CM | POA: Diagnosis not present

## 2021-08-28 DIAGNOSIS — Z79891 Long term (current) use of opiate analgesic: Secondary | ICD-10-CM | POA: Diagnosis not present

## 2021-08-28 DIAGNOSIS — M47817 Spondylosis without myelopathy or radiculopathy, lumbosacral region: Secondary | ICD-10-CM | POA: Diagnosis not present

## 2021-08-28 DIAGNOSIS — M5117 Intervertebral disc disorders with radiculopathy, lumbosacral region: Secondary | ICD-10-CM | POA: Diagnosis not present

## 2021-08-29 ENCOUNTER — Ambulatory Visit: Payer: Medicare HMO | Attending: Physician Assistant

## 2021-08-29 ENCOUNTER — Other Ambulatory Visit: Payer: Self-pay

## 2021-08-29 DIAGNOSIS — M25571 Pain in right ankle and joints of right foot: Secondary | ICD-10-CM | POA: Insufficient documentation

## 2021-08-29 DIAGNOSIS — M25631 Stiffness of right wrist, not elsewhere classified: Secondary | ICD-10-CM | POA: Diagnosis not present

## 2021-08-29 DIAGNOSIS — M6281 Muscle weakness (generalized): Secondary | ICD-10-CM | POA: Insufficient documentation

## 2021-08-29 DIAGNOSIS — R2689 Other abnormalities of gait and mobility: Secondary | ICD-10-CM | POA: Insufficient documentation

## 2021-08-29 DIAGNOSIS — M25671 Stiffness of right ankle, not elsewhere classified: Secondary | ICD-10-CM | POA: Insufficient documentation

## 2021-08-29 DIAGNOSIS — S6981XD Other specified injuries of right wrist, hand and finger(s), subsequent encounter: Secondary | ICD-10-CM | POA: Diagnosis not present

## 2021-08-29 DIAGNOSIS — M25531 Pain in right wrist: Secondary | ICD-10-CM | POA: Diagnosis not present
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
## 2021-09-07 DIAGNOSIS — M25631 Stiffness of right wrist, not elsewhere classified: Secondary | ICD-10-CM | POA: Diagnosis not present

## 2021-09-07 DIAGNOSIS — M25531 Pain in right wrist: Secondary | ICD-10-CM | POA: Diagnosis not present

## 2021-09-07 DIAGNOSIS — S6981XD Other specified injuries of right wrist, hand and finger(s), subsequent encounter: Secondary | ICD-10-CM | POA: Diagnosis not present

## 2021-09-07 DIAGNOSIS — M67441 Ganglion, right hand: Secondary | ICD-10-CM | POA: Diagnosis not present

## 2021-09-12 ENCOUNTER — Ambulatory Visit: Payer: Medicare HMO | Attending: Physician Assistant

## 2021-09-12 ENCOUNTER — Other Ambulatory Visit: Payer: Self-pay

## 2021-09-12 DIAGNOSIS — M25671 Stiffness of right ankle, not elsewhere classified: Secondary | ICD-10-CM | POA: Diagnosis not present

## 2021-09-12 DIAGNOSIS — R2689 Other abnormalities of gait and mobility: Secondary | ICD-10-CM | POA: Diagnosis not present

## 2021-09-12 DIAGNOSIS — M6281 Muscle weakness (generalized): Secondary | ICD-10-CM | POA: Diagnosis not present

## 2021-09-12 DIAGNOSIS — M25571 Pain in right ankle and joints of right foot: Secondary | ICD-10-CM | POA: Insufficient documentation
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
## 2021-09-13 DIAGNOSIS — R29898 Other symptoms and signs involving the musculoskeletal system: Secondary | ICD-10-CM | POA: Diagnosis not present

## 2021-09-13 DIAGNOSIS — S6981XD Other specified injuries of right wrist, hand and finger(s), subsequent encounter: Secondary | ICD-10-CM | POA: Diagnosis not present

## 2021-09-13 DIAGNOSIS — M25531 Pain in right wrist: Secondary | ICD-10-CM | POA: Diagnosis not present

## 2021-09-13 DIAGNOSIS — M25631 Stiffness of right wrist, not elsewhere classified: Secondary | ICD-10-CM | POA: Diagnosis not present

## 2021-09-14 ENCOUNTER — Other Ambulatory Visit: Payer: Self-pay

## 2021-09-14 ENCOUNTER — Ambulatory Visit: Payer: Medicare HMO

## 2021-09-14 DIAGNOSIS — M6281 Muscle weakness (generalized): Secondary | ICD-10-CM | POA: Diagnosis not present

## 2021-09-14 DIAGNOSIS — R2689 Other abnormalities of gait and mobility: Secondary | ICD-10-CM

## 2021-09-14 DIAGNOSIS — M25571 Pain in right ankle and joints of right foot: Secondary | ICD-10-CM

## 2021-09-14 DIAGNOSIS — M25671 Stiffness of right ankle, not elsewhere classified: Secondary | ICD-10-CM | POA: Diagnosis not present

## 2021-09-14 NOTE — Therapy (Signed)
OUTPATIENT PHYSICAL THERAPY TREATMENT NOTE   Patient Name: Danielle [name] MRN: [id number] DOB:06-09-1971, 51 y.o., female Today's Date: 09/14/2021  PCP: Jordan Hawks, [place] REFERRING PROVIDER: Conrad [place], PA-C   PT End of Session - 09/14/21 1452     Visit Number 3    Number of Visits 6    Date for PT Re-Evaluation 09/19/21    Authorization Type medpay    Progress Note Due on Visit 6    PT Start Time 4098    PT Stop Time 1530    PT Time Calculation (min) 45 min    Activity Tolerance Patient limited by pain    Behavior During Therapy [facility] for tasks assessed/performed             Past Medical History:  Diagnosis Date   Abnormal uterine bleeding (AUB)    Achalasia    Type 2   Anemia    Anxiety    Asthma    Bipolar disorder (HCC)    Mixed   Bulging lumbar disc    Chronic back pain    Condyloma acuminata    Depression    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   Ganglion cyst    Right hand   Genital HSV    GERD (gastroesophageal reflux disease)    Hepatitis    States she got a shot for this   HIV infection ([place])    Migraines    Pneumonia    Pre-diabetes    Trichimoniasis    Urinary incontinence    Uterine fibroid    Wears glasses    Past Surgical History:  Procedure Laterality Date   ABDOMINAL HYSTERECTOMY N/A 06/28/2015   Procedure: HYSTERECTOMY ABDOMINAL;  Surgeon: Osborne Oman, MD;  Location: [facility];  Service: Gynecology;  Laterality: N/A;  Requested 06/28/15 @ 7:30a   ANKLE ARTHROSCOPY Right 10/16/2017   Procedure: RIGHT ANKLE ARTHROSCOPIC DEBRIDEMENT;  Surgeon: Newt Minion, MD;  Location: [place];  Service: Orthopedics;  Laterality: Right;   ARTHRODESIS METATARSALPHALANGEAL JOINT (MTPJ) Right 07/23/2018   Procedure: RIGHT GREAT TOE METATARSOPHALANGEAL JOINT FUSION;  Surgeon: Newt Minion, MD;  Location: [place];  Service: Orthopedics;  Laterality: Right;   BILATERAL SALPINGECTOMY Bilateral 06/28/2015   Procedure: BILATERAL SALPINGECTOMY;  Surgeon:  Osborne Oman, MD;  Location: [place] [facility];  Service: Gynecology;  Laterality: Bilateral;   CESAREAN SECTION     twice;  vertical incision x 2   COLONOSCOPY N/A 10/12/2013   Procedure: COLONOSCOPY;  Surgeon: Jerene Bears, MD;  [facility];  Service: Endoscopy;  Laterality: N/A;   CYSTO WITH HYDRODISTENSION N/A 05/27/2018   Procedure: CYSTOSCOPY/HYDRODISTENSION AND INSTILLATION;  Surgeon: Bjorn Loser, MD;  Location: [facility];  Service: Urology;  Laterality: N/A;   ESOPHAGEAL MANOMETRY N/A 06/29/2013   Procedure: ESOPHAGEAL MANOMETRY (EM);  Surgeon: Jerene Bears, MD;  [facility];  Service: Gastroenterology;  Laterality: N/A;   FOREIGN BODY REMOVAL Left 2018   fiberglass, left thumb   HELLER MYOTOMY N/A 10/08/2013   Procedure: LAPAROSCOPIC HELLER MYOTOMY, DOR FUNDIPLICATION, UPPER ENDOSCOPY;  Surgeon: Odis Hollingshead, MD;  [facility];  Service: General;  Laterality: N/A;   HERNIA REPAIR     Umbilical   HYSTEROSCOPY WITH RESECTOSCOPE N/A 05/03/2015   Procedure: Diagnostic Hysteroscopy;  Surgeon: Lavonia Drafts, MD;  Location: [place] [facility];  Service: Gynecology;  Laterality: N/A;  wants Novasure and myosure.   LYSIS OF ADHESION  06/28/2015   Procedure: LYSIS OF ADHESION;  Surgeon: Osborne Oman, MD;  Location: [facility] [facility];  Service: Gynecology;;   OTHER SURGICAL HISTORY     biopsy axillary ,right arm   right arm biopsy     UPPER GI ENDOSCOPY N/A 10/08/2013   Procedure: UPPER GI ENDOSCOPY;  Surgeon: Odis Hollingshead, MD;  [facility];  Service: General;  Laterality: N/A;   WISDOM TOOTH EXTRACTION     WRIST ARTHROSCOPY WITH DEBRIDEMENT Right 06/06/2021   Procedure: ARTHROSCOPY WRIST, DEBRIDEMENT TFCC, SYNOVECTOMY AND  EXTENSO CARPI ULNARIS  REPAIR;  Surgeon: Renette Butters, MD;  Location: [place];  Service: Orthopedics;  Laterality: Right;   Patient Active Problem List   Diagnosis Date Noted   Obesity (BMI 30-39.9)  02/02/2020   Hallux rigidus, right foot 03/11/2018   Achilles tendon contracture, right 03/11/2018   Impingement syndrome of right ankle    Sprain of calcaneofibular ligament of right ankle 07/04/2017   Ankle pain, right 06/24/2017   Hyperglycemia 02/18/2017   Hepatitis B immune 02/18/2017   Fibroadenoma of breast 02/18/2017   S/P total abdominal hysterectomy 06/28/2015   Condyloma acuminatum 02/23/2015   Urinary incontinence 01/05/2015   Abnormal finding on GI tract imaging 10/12/2013   Anemia-chronic 10/09/2013   Achalasia s/p laparoscopic Heller myotomy and Dor fundoplication 85/10/7739   Ganglion cyst of finger of right hand 07/06/2013   Trichomoniasis of vagina 06/03/2013   GERD (gastroesophageal reflux disease) 03/16/2013   Depression 02/10/2009   Human immunodeficiency virus (HIV) disease ([place]) 03/24/2007   Anxiety state 03/24/2007   ASTHMA 03/24/2007    REFERRING DIAG: Pain in right ankle and joints of right foot  Muscle weakness (generalized)  Other abnormalities of gait and mobility  THERAPY DIAG:  Pain in right ankle and joints of right foot  Muscle weakness (generalized)  Other abnormalities of gait and mobility  PERTINENT HISTORY: see eval  PRECAUTIONS: none  SUBJECTIVE: No changes to report regarding R ankle pain and function  PAIN:  Are you having pain? 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       POSTURE:  WFL   PALPATION: Exquisitely tender to dorsum of R foot, good DP PT pulses   LE AROM/PROM:   A/PROM Right 08/31/2021  Left 08/31/2021  Hip flexion      Hip extension      Hip abduction      Hip adduction      Hip internal rotation      Hip external rotation      Knee flexion      Knee extension      Ankle dorsiflexion 4 10  Ankle plantarflexion [facility] WFL  Ankle inversion [facility]  Ankle eversion WFL WFL   (Blank rows = not tested)   LE MMT:   MMT Right 08/31/2021 Left 08/31/2021  Hip flexion      Hip extension      Hip abduction  Hip adduction      Hip internal rotation      Hip external rotation      Knee flexion      Knee extension      Ankle dorsiflexion 3 5  Ankle plantarflexion 3 5  Ankle inversion 3 5  Ankle eversion 3 5   (Blank rows = not tested)     FUNCTIONAL TESTS:  MCTSIB: Condition 1: Avg of 3 trials: 30 sec, Condition 2: Avg of 3 trials: 30 sec, Condition 3: Avg of 3 trials: 30 sec, Condition 4: Avg of 3 trials: 30x4=120 sec, and Total Score: 120/120   GAIT: Distance walked: 100 Assistive device utilized: None Level of assistance: Complete Independence Comments:        TODAY'S TREATMENT: [facility] Adult PT Treatment:                                                DATE: 09/14/21 Therapeutic Exercise: Nustep L2 seat 4, no arms, 7 min R ankle circles, 30 CW/CCW with 2# weight on forefoot PF stretch in long sit 30s x3  Therapeutic Activity: Rocker board A/P, 30x with UE support Rocker board M/L 30x with UE support Marching on Airex 30x with UE support   DATE: 09/12/21 Therapeutic Exercise: Nustep L2 seat 4, arms 6, 6 min Rocker board A/P AROM 30x R ankle circles, 30 CW/CCW PF stretch in long sit 30s x3 Manual Therapy: N/a Neuromuscular re-ed: Airex 30s EO, EC, normal and tandem stances for a total of 6 bouts Marching from Airex Therapeutic Activity: N/a Modalities: N/a Self Care: N/a      PATIENT EDUCATION:  Education details: instructed in HEP as well as desentization techniques Person educated: Patient Education method: Explanation and  Handouts Education comprehension: verbalized understanding and needs further education     HOME EXERCISE PROGRAM: Access Code: [email or web address] URL: [email or web address] Date: 08/29/2021 Prepared by: Sharlynn Oliphant   Exercises Long Sitting Plantar Fascia Stretch with Towel - 2 x daily - 7 x weekly - 1 sets - 3 reps - 30s hold Towel Scrunches - 2 x daily - 7 x weekly - 1 sets - 1 reps - 60s hold Long Sitting Calf Stretch with Strap - 2 x daily - 7 x weekly - 1 sets - 3 reps - 30s hold     ASSESSMENT:   CLINICAL IMPRESSION: Patient remains consistent in HEP performance and continues to perform work and ADL tasks despite pain. Overall symptoms have not improved but she is able to tolerate treatment tasks with rest breaks as needed.  Still awaiting approval on recommended nerve block injection to be performed by Dr. Clemens Catholic POTENTIAL: fair   CLINICAL DECISION MAKING: Evolving/moderate complexity   EVALUATION COMPLEXITY: Moderate     GOALS: Goals reviewed with patient? Yes   SHORT TERM GOALS:   STG Name Target Date Goal status  1 STGs=LTGs Baseline:  09/21/2021 INITIAL    LTG Name Target Date Goal status  1 Patient to demonstrate independence in HEP    Baseline:Access Code: [email or web address] URL: [email or web address] Date: 08/29/2021 Prepared by: Sharlynn Oliphant   Exercises Long Sitting Plantar Fascia Stretch with Towel - 2 x daily - 7 x weekly - 1 sets - 3 reps - 30s hold Towel Scrunches - 2 x daily - 7 x weekly - 1 sets - 1 reps - 60s hold  Long Sitting Calf Stretch with Strap - 2 x daily - 7 x weekly - 1 sets - 3 reps - 30s hold   09/21/2021 INITIAL  2 Increase AROM DF to 7d Baseline: 4d AROM DF 09/21/2021 INITIAL  3 Increase R ankle strength to 4/5 Baseline:3/5 09/21/2021 INITIAL  4 Patient to demo 30s SLS on R Baseline:Unable to tolerate 09/21/2021 INITIAL    PT FREQUENCY: 2x/week   PT DURATION: 4 weeks   PLANNED INTERVENTIONS: Therapeutic  exercises, Therapeutic activity, Neuro Muscular re-education, Balance training, Gait training, Patient/Family education, Joint mobilization, Stair training, and desentization techniques   PLAN FOR NEXT SESSION: f/u on HEP, closed chain strength and balance tasks, aerobic training   Lanice Shirts PT 09/14/2021, 3:42 PM

## 2021-09-15 DIAGNOSIS — M25631 Stiffness of right wrist, not elsewhere classified: Secondary | ICD-10-CM | POA: Diagnosis not present

## 2021-09-15 DIAGNOSIS — S6981XD Other specified injuries of right wrist, hand and finger(s), subsequent encounter: Secondary | ICD-10-CM | POA: Diagnosis not present

## 2021-09-15 DIAGNOSIS — M25531 Pain in right wrist: Secondary | ICD-10-CM | POA: Diagnosis not present

## 2021-09-18 ENCOUNTER — Other Ambulatory Visit: Payer: Self-pay

## 2021-09-18 ENCOUNTER — Ambulatory Visit: Payer: Medicare HMO

## 2021-09-18 DIAGNOSIS — R2689 Other abnormalities of gait and mobility: Secondary | ICD-10-CM | POA: Diagnosis not present

## 2021-09-18 DIAGNOSIS — M6281 Muscle weakness (generalized): Secondary | ICD-10-CM | POA: Diagnosis not present

## 2021-09-18 DIAGNOSIS — M25671 Stiffness of right ankle, not elsewhere classified: Secondary | ICD-10-CM

## 2021-09-18 DIAGNOSIS — M25571 Pain in right ankle and joints of right foot: Secondary | ICD-10-CM

## 2021-09-18 DIAGNOSIS — M47817 Spondylosis without myelopathy or radiculopathy, lumbosacral region: Secondary | ICD-10-CM | POA: Diagnosis not present

## 2021-09-18 NOTE — Therapy (Signed)
OUTPATIENT PHYSICAL THERAPY TREATMENT NOTE   Patient Name: Danielle [name] MRN: [id number] DOB:[birth year], 51 y.o., female Today's Date: 09/18/2021  PCP: Jordan Hawks, [place] REFERRING PROVIDER: Conrad [place], PA-C   PT End of Session - 09/18/21 1048     Visit Number 4    Number of Visits 6    Date for PT Re-Evaluation 09/19/21    Authorization Type medpay    Progress Note Due on Visit 8    PT Start Time 1045    PT Stop Time 1130    PT Time Calculation (min) 45 min    Activity Tolerance Patient limited by pain    Behavior During Therapy [facility] for tasks assessed/performed             Past Medical History:  Diagnosis Date   Abnormal uterine bleeding (AUB)    Achalasia    Type 2   Anemia    Anxiety    Asthma    Bipolar disorder (HCC)    Mixed   Bulging lumbar disc    Chronic back pain    Condyloma acuminata    Depression    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   Ganglion cyst    Right hand   Genital HSV    GERD (gastroesophageal reflux disease)    Hepatitis    States she got a shot for this   HIV infection ([place])    Migraines    Pneumonia    Pre-diabetes    Trichimoniasis    Urinary incontinence    Uterine fibroid    Wears glasses    Past Surgical History:  Procedure Laterality Date   ABDOMINAL HYSTERECTOMY N/A 06/28/2015   Procedure: HYSTERECTOMY ABDOMINAL;  Surgeon: Osborne Oman, MD;  Location: [facility];  Service: Gynecology;  Laterality: N/A;  Requested 06/28/15 @ 7:30a   ANKLE ARTHROSCOPY Right 10/16/2017   Procedure: RIGHT ANKLE ARTHROSCOPIC DEBRIDEMENT;  Surgeon: Newt Minion, MD;  Location: [place];  Service: Orthopedics;  Laterality: Right;   ARTHRODESIS METATARSALPHALANGEAL JOINT (MTPJ) Right 07/23/2018   Procedure: RIGHT GREAT TOE METATARSOPHALANGEAL JOINT FUSION;  Surgeon: Newt Minion, MD;  Location: [place];  Service: Orthopedics;  Laterality: Right;   BILATERAL SALPINGECTOMY Bilateral 06/28/2015   Procedure: BILATERAL SALPINGECTOMY;  Surgeon:  Osborne Oman, MD;  Location: [place] [facility];  Service: Gynecology;  Laterality: Bilateral;   CESAREAN SECTION     twice;  vertical incision x 2   COLONOSCOPY N/A 10/12/2013   Procedure: COLONOSCOPY;  Surgeon: Jerene Bears, MD;  [facility];  Service: Endoscopy;  Laterality: N/A;   CYSTO WITH HYDRODISTENSION N/A 05/27/2018   Procedure: CYSTOSCOPY/HYDRODISTENSION AND INSTILLATION;  Surgeon: Bjorn Loser, MD;  Location: [facility];  Service: Urology;  Laterality: N/A;   ESOPHAGEAL MANOMETRY N/A 06/29/2013   Procedure: ESOPHAGEAL MANOMETRY (EM);  Surgeon: Jerene Bears, MD;  [facility];  Service: Gastroenterology;  Laterality: N/A;   FOREIGN BODY REMOVAL Left 2018   fiberglass, left thumb   HELLER MYOTOMY N/A 10/08/2013   Procedure: LAPAROSCOPIC HELLER MYOTOMY, DOR FUNDIPLICATION, UPPER ENDOSCOPY;  Surgeon: Odis Hollingshead, MD;  [facility];  Service: General;  Laterality: N/A;   HERNIA REPAIR     Umbilical   HYSTEROSCOPY WITH RESECTOSCOPE N/A 05/03/2015   Procedure: Diagnostic Hysteroscopy;  Surgeon: Lavonia Drafts, MD;  Location: [place] [facility];  Service: Gynecology;  Laterality: N/A;  wants Novasure and myosure.   LYSIS OF ADHESION  06/28/2015   Procedure: LYSIS OF ADHESION;  Surgeon: Osborne Oman, MD;  Location: [place] [facility];  Service: Gynecology;;   OTHER SURGICAL HISTORY     biopsy axillary ,right arm   right arm biopsy     UPPER GI ENDOSCOPY N/A 10/08/2013   Procedure: UPPER GI ENDOSCOPY;  Surgeon: Odis Hollingshead, MD;  [facility];  Service: General;  Laterality: N/A;   WISDOM TOOTH EXTRACTION     WRIST ARTHROSCOPY WITH DEBRIDEMENT Right 06/06/2021   Procedure: ARTHROSCOPY WRIST, DEBRIDEMENT TFCC, SYNOVECTOMY AND  EXTENSO CARPI ULNARIS  REPAIR;  Surgeon: Renette Butters, MD;  Location: [place];  Service: Orthopedics;  Laterality: Right;   Patient Active Problem List   Diagnosis Date Noted   Obesity (BMI 30-39.9)  02/02/2020   Hallux rigidus, right foot 03/11/2018   Achilles tendon contracture, right 03/11/2018   Impingement syndrome of right ankle    Sprain of calcaneofibular ligament of right ankle 07/04/2017   Ankle pain, right 06/24/2017   Hyperglycemia 02/18/2017   Hepatitis B immune 02/18/2017   Fibroadenoma of breast 02/18/2017   S/P total abdominal hysterectomy 06/28/2015   Condyloma acuminatum 02/23/2015   Urinary incontinence 01/05/2015   Abnormal finding on GI tract imaging 10/12/2013   Anemia-chronic 10/09/2013   Achalasia s/p laparoscopic Heller myotomy and Dor fundoplication 96/29/5284   Ganglion cyst of finger of right hand 07/06/2013   Trichomoniasis of vagina 06/03/2013   GERD (gastroesophageal reflux disease) 03/16/2013   Depression 02/10/2009   Human immunodeficiency virus (HIV) disease ([place]) 03/24/2007   Anxiety state 03/24/2007   ASTHMA 03/24/2007    REFERRING DIAG: Pain in right ankle and joints of right foot  Muscle weakness (generalized)  Other abnormalities of gait and mobility  THERAPY DIAG:  Pain in right ankle and joints of right foot  Muscle weakness (generalized)  Stiffness of right ankle, not elsewhere classified  Other abnormalities of gait and mobility  PERTINENT HISTORY: see eval  PRECAUTIONS: none  SUBJECTIVE: No changes to report regarding R ankle pain and function, will undergo spinal injection this week by Dr. Vira Blanco  For chronic back pain not related to her CRPS PAIN:  Are you having pain? 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        POSTURE:  WFL   PALPATION: Exquisitely tender to dorsum of R foot, good DP PT pulses   LE AROM/PROM:   A/PROM Right 08/31/2021 Left 08/31/2021  Hip flexion      Hip extension      Hip abduction      Hip adduction      Hip internal rotation      Hip external rotation      Knee flexion      Knee extension      Ankle dorsiflexion 4 10  Ankle plantarflexion [facility] WFL  Ankle inversion [facility] WFL  Ankle eversion WFL WFL   (Blank rows = not tested)   LE MMT:  MMT Right 08/31/2021 Left 08/31/2021  Hip flexion      Hip extension      Hip abduction      Hip adduction      Hip internal rotation      Hip external rotation      Knee flexion      Knee extension      Ankle dorsiflexion 3+ 5  Ankle plantarflexion 3+ 5  Ankle inversion 3+ 5  Ankle eversion 3+ 5   (Blank rows = not tested)     FUNCTIONAL TESTS:  MCTSIB: Condition 1: Avg of 3 trials: 30 sec, Condition 2: Avg of 3 trials: 30 sec, Condition 3: Avg of 3 trials: 30 sec, Condition 4: Avg of 3 trials: 30x4=120 sec, and Total Score: 120/120   GAIT: Distance walked: 100 Assistive device utilized: None Level of assistance: Complete Independence Comments:        TODAY'S TREATMENT: [facility] Adult PT Treatment:                                                DATE: 09/18/21 Therapeutic Exercise: Nustep L3 seat 4, no arms, 8 min R ankle circles, 30 CW/CCW with 3# weight on forefoot PF stretch in long sit 30s x3 Marching on airex alternating, 30x  Therapeutic Activity: Rocker board A/P, 30x with UE support Step ups into rocker board, up R, down L, 15x Rocker board M/L 30x with UE support mCTSIB holding all 4 positions for 30s but WS onto LLE to accomodate R foot pain  [facility] Adult PT Treatment:                                                DATE: 09/14/21 Therapeutic Exercise: Nustep L2 seat 4, no arms, 7 min R ankle circles, 30 CW/CCW with 2# weight on forefoot PF stretch in long sit 30s x3  Therapeutic  Activity: Rocker board A/P, 30x with UE support Rocker board M/L 30x with UE support Marching on Airex 30x with UE support   DATE: 09/12/21 Therapeutic Exercise: Nustep L2 seat 4, arms 6, 6 min Rocker board A/P AROM 30x R ankle circles, 30 CW/CCW PF stretch in long sit 30s x3 Manual Therapy: N/a Neuromuscular re-ed: Airex 30s EO, EC, normal and tandem stances for a total of 6 bouts Marching from Airex Therapeutic Activity: N/a Modalities: N/a Self Care: N/a      PATIENT EDUCATION:  Education details: instructed in HEP as well as desentization techniques Person educated: Patient Education method: Explanation and Handouts Education comprehension: verbalized understanding and needs further education     HOME EXERCISE PROGRAM: Access Code: [email or web address] URL: [email or web address] Date: 08/29/2021 Prepared by: Sharlynn Oliphant   Exercises Long Sitting Plantar Fascia Stretch with Towel - 2 x daily - 7 x weekly - 1 sets - 3 reps - 30s hold Towel Scrunches - 2 x daily - 7 x weekly - 1 sets - 1 reps - 60s hold Long Sitting Calf Stretch with Strap - 2 x daily - 7 x weekly - 1 sets - 3 reps - 30s hold     ASSESSMENT:   CLINICAL IMPRESSION: No changes to report overall, symptoms remain.  Will undergo spinal injections by Dr. Vira Blanco to  alleviate low back pain.  Able to tolerate increased resistance on nustep.  Added step ups onto rockerboard w/o increased symptoms, able to tolerate all 4 positions on mCTSIB for 30s hold, strength grades upgraded throughout  REHAB POTENTIAL: fair   CLINICAL DECISION MAKING: Evolving/moderate complexity   EVALUATION COMPLEXITY: Moderate     GOALS: Goals reviewed with patient? Yes   SHORT TERM GOALS:   STG Name Target Date Goal status  1 STGs=LTGs Baseline:  09/21/2021 INITIAL    LTG Name Target Date Goal status  1 Patient to demonstrate independence in HEP    Baseline:Access Code: [email or web address] URL:  [email or web address] Date: 08/29/2021 Prepared by: Sharlynn Oliphant   Exercises Long Sitting Plantar Fascia Stretch with Towel - 2 x daily - 7 x weekly - 1 sets - 3 reps - 30s hold Towel Scrunches - 2 x daily - 7 x weekly - 1 sets - 1 reps - 60s hold Long Sitting Calf Stretch with Strap - 2 x daily - 7 x weekly - 1 sets - 3 reps - 30s hold   09/21/2021 INITIAL  2 Increase AROM DF to 7d Baseline: 4d AROM DF 09/21/2021 INITIAL  3 Increase R ankle strength to 4/5 Baseline:3/5 09/21/2021 INITIAL  4 Patient to demo 30s SLS on R Baseline:Unable to tolerate 09/21/2021 INITIAL    PT FREQUENCY: 2x/week   PT DURATION: 4 weeks   PLANNED INTERVENTIONS: Therapeutic exercises, Therapeutic activity, Neuro Muscular re-education, Balance training, Gait training, Patient/Family education, Joint mobilization, Stair training, and desentization techniques   PLAN FOR NEXT SESSION: f/u on HEP, closed chain strength and balance tasks, aerobic training, measure AROM DF, stepping tasks   Lanice Shirts PT 09/18/2021, 10:50 AM

## 2021-09-19 DIAGNOSIS — M25531 Pain in right wrist: Secondary | ICD-10-CM | POA: Diagnosis not present

## 2021-09-19 DIAGNOSIS — R29898 Other symptoms and signs involving the musculoskeletal system: Secondary | ICD-10-CM | POA: Diagnosis not present

## 2021-09-19 DIAGNOSIS — M25631 Stiffness of right wrist, not elsewhere classified: Secondary | ICD-10-CM | POA: Diagnosis not present

## 2021-09-19 DIAGNOSIS — S6981XD Other specified injuries of right wrist, hand and finger(s), subsequent encounter: Secondary | ICD-10-CM | POA: Diagnosis not present

## 2021-09-22 ENCOUNTER — Other Ambulatory Visit: Payer: Self-pay

## 2021-09-22 ENCOUNTER — Ambulatory Visit: Payer: Medicare HMO

## 2021-09-22 DIAGNOSIS — R2689 Other abnormalities of gait and mobility: Secondary | ICD-10-CM | POA: Diagnosis not present

## 2021-09-22 DIAGNOSIS — M25571 Pain in right ankle and joints of right foot: Secondary | ICD-10-CM

## 2021-09-22 DIAGNOSIS — M25631 Stiffness of right wrist, not elsewhere classified: Secondary | ICD-10-CM | POA: Diagnosis not present

## 2021-09-22 DIAGNOSIS — M25671 Stiffness of right ankle, not elsewhere classified: Secondary | ICD-10-CM | POA: Diagnosis not present

## 2021-09-22 DIAGNOSIS — M6281 Muscle weakness (generalized): Secondary | ICD-10-CM

## 2021-09-22 DIAGNOSIS — S6981XD Other specified injuries of right wrist, hand and finger(s), subsequent encounter: Secondary | ICD-10-CM | POA: Diagnosis not present

## 2021-09-22 DIAGNOSIS — R29898 Other symptoms and signs involving the musculoskeletal system: Secondary | ICD-10-CM | POA: Diagnosis not present

## 2021-09-22 DIAGNOSIS — M25531 Pain in right wrist: Secondary | ICD-10-CM | POA: Diagnosis not present

## 2021-09-22 NOTE — Therapy (Signed)
OUTPATIENT PHYSICAL THERAPY TREATMENT NOTE   Patient Name: Danielle [name] MRN: [id number] DOB:March 31, 1971, 51 y.o., female Today's Date: 09/22/2021  PCP: Georgiann Cocker, FNP REFERRING PROVIDER: Marvia Pickles, PA-C   PT End of Session - 09/22/21 1004     Visit Number 5    Number of Visits 8    Date for PT Re-Evaluation 09/19/21    Authorization Type medpay    Progress Note Due on Visit 8    PT Start Time 1000    PT Stop Time 1045    PT Time Calculation (min) 45 min    Activity Tolerance Patient limited by pain    Behavior During Therapy [facility] for tasks assessed/performed             Past Medical History:  Diagnosis Date   Abnormal uterine bleeding (AUB)    Achalasia    Type 2   Anemia    Anxiety    Asthma    Bipolar disorder (HCC)    Mixed   Bulging lumbar disc    Chronic back pain    Condyloma acuminata    Depression    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   Ganglion cyst    Right hand   Genital HSV    GERD (gastroesophageal reflux disease)    Hepatitis    States she got a shot for this   HIV infection (HCC)    Migraines    Pneumonia    Pre-diabetes    Trichimoniasis    Urinary incontinence    Uterine fibroid    Wears glasses    Past Surgical History:  Procedure Laterality Date   ABDOMINAL HYSTERECTOMY N/A 06/28/2015   Procedure: HYSTERECTOMY ABDOMINAL;  Surgeon: Tereso Newcomer, MD;  Location: [facility];  Service: Gynecology;  Laterality: N/A;  Requested 06/28/15 @ 7:30a   ANKLE ARTHROSCOPY Right 10/16/2017   Procedure: RIGHT ANKLE ARTHROSCOPIC DEBRIDEMENT;  Surgeon: Nadara Mustard, MD;  Location: [facility];  Service: Orthopedics;  Laterality: Right;   ARTHRODESIS METATARSALPHALANGEAL JOINT (MTPJ) Right 07/23/2018   Procedure: RIGHT GREAT TOE METATARSOPHALANGEAL JOINT FUSION;  Surgeon: Nadara Mustard, MD;  Location: [facility] OR;  Service: Orthopedics;  Laterality: Right;   BILATERAL SALPINGECTOMY Bilateral 06/28/2015   Procedure: BILATERAL SALPINGECTOMY;  Surgeon:  Tereso Newcomer, MD;  Location: [facility];  Service: Gynecology;  Laterality: Bilateral;   CESAREAN SECTION     twice;  vertical incision x 2   COLONOSCOPY N/A 10/12/2013   Procedure: COLONOSCOPY;  Surgeon: Beverley Fiedler, MD;  Location: [facility];  Service: Endoscopy;  Laterality: N/A;   CYSTO WITH HYDRODISTENSION N/A 05/27/2018   Procedure: CYSTOSCOPY/HYDRODISTENSION AND INSTILLATION;  Surgeon: Alfredo Martinez, MD;  Location: [facility];  Service: Urology;  Laterality: N/A;   ESOPHAGEAL MANOMETRY N/A 06/29/2013   Procedure: ESOPHAGEAL MANOMETRY (EM);  Surgeon: Beverley Fiedler, MD;  [facility];  Service: Gastroenterology;  Laterality: N/A;   FOREIGN BODY REMOVAL Left 2018   fiberglass, left thumb   HELLER MYOTOMY N/A 10/08/2013   Procedure: LAPAROSCOPIC HELLER MYOTOMY, DOR FUNDIPLICATION, UPPER ENDOSCOPY;  Surgeon: Adolph Pollack, MD;  Location: [facility];  Service: General;  Laterality: N/A;   HERNIA REPAIR     Umbilical   HYSTEROSCOPY WITH RESECTOSCOPE N/A 05/03/2015   Procedure: Diagnostic Hysteroscopy;  Surgeon: Willodean Rosenthal, MD;  Location: [facility];  Service: Gynecology;  Laterality: N/A;  wants Novasure and myosure.   LYSIS OF ADHESION  06/28/2015   Procedure: LYSIS OF ADHESION;  Surgeon: Tereso Newcomer, MD;  Location: [facility];  Service: Gynecology;;   OTHER SURGICAL HISTORY     biopsy axillary ,right arm   right arm biopsy     UPPER GI ENDOSCOPY N/A 10/08/2013   Procedure: UPPER GI ENDOSCOPY;  Surgeon: Adolph Pollack, MD;  Location: [facility];  Service: General;  Laterality: N/A;   WISDOM TOOTH EXTRACTION     WRIST ARTHROSCOPY WITH DEBRIDEMENT Right 06/06/2021   Procedure: ARTHROSCOPY WRIST, DEBRIDEMENT TFCC, SYNOVECTOMY AND  EXTENSO CARPI ULNARIS  REPAIR;  Surgeon: Sheral Apley, MD;  Location: [facility];  Service: Orthopedics;  Laterality: Right;   Patient Active Problem List   Diagnosis Date Noted   Obesity (BMI 30-39.9)  02/02/2020   Hallux rigidus, right foot 03/11/2018   Achilles tendon contracture, right 03/11/2018   Impingement syndrome of right ankle    Sprain of calcaneofibular ligament of right ankle 07/04/2017   Ankle pain, right 06/24/2017   Hyperglycemia 02/18/2017   Hepatitis B immune 02/18/2017   Fibroadenoma of breast 02/18/2017   S/P total abdominal hysterectomy 06/28/2015   Condyloma acuminatum 02/23/2015   Urinary incontinence 01/05/2015   Abnormal finding on GI tract imaging 10/12/2013   Anemia-chronic 10/09/2013   Achalasia s/p laparoscopic Heller myotomy and Dor fundoplication 07/29/2013   Ganglion cyst of finger of right hand 07/06/2013   Trichomoniasis of vagina 06/03/2013   GERD (gastroesophageal reflux disease) 03/16/2013   Depression 02/10/2009   Human immunodeficiency virus (HIV) disease (HCC) 03/24/2007   Anxiety state 03/24/2007   ASTHMA 03/24/2007    REFERRING DIAG: Pain in right ankle and joints of right foot  Muscle weakness (generalized)  Other abnormalities of gait and mobility  THERAPY DIAG:  Pain in right ankle and joints of right foot  Muscle weakness (generalized)  Other abnormalities of gait and mobility  PERTINENT HISTORY: see eval  PRECAUTIONS: none  SUBJECTIVE: No changes to report regarding R ankle pain and function, had spinal injections for hx of low back pain but no treatment of R ankle symptoms  PAIN:  Are you having pain? 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       POSTURE:  WFL   PALPATION: Exquisitely tender to dorsum of R  foot, good DP PT pulses   LE AROM/PROM:   A/PROM Right 10/02/21 Left 08/31/2021  Hip flexion      Hip extension      Hip abduction      Hip adduction      Hip internal rotation      Hip external rotation      Knee flexion      Knee extension      Ankle dorsiflexion 9/18 10  Ankle plantarflexion [facility] WFL  Ankle inversion [facility]  Ankle eversion WFL WFL   (Blank rows = not tested)   LE MMT:   MMT Right 09/22/21 Left 08/31/2021  Hip flexion  Hip extension      Hip abduction      Hip adduction      Hip internal rotation      Hip external rotation      Knee flexion      Knee extension      Ankle dorsiflexion 4 5  Ankle plantarflexion 4 5  Ankle inversion 3+ 5  Ankle eversion 3+ 5   (Blank rows = not tested)     FUNCTIONAL TESTS:  MCTSIB: Condition 1: Avg of 3 trials: 30 sec, Condition 2: Avg of 3 trials: 30 sec, Condition 3: Avg of 3 trials: 30 sec, Condition 4: Avg of 3 trials: 30x4=120 sec, and Total Score: 120/120   GAIT: Distance walked: 100 Assistive device utilized: None Level of assistance: Complete Independence Comments:        TODAY'S TREATMENT: [facility] Adult PT Treatment:                                                DATE: 09/22/21 Therapeutic Exercise: Nustep L4 seat 4, no arms, 8 min R ankle circles, 30 CW/CCW with 3# weight on forefoot PF stretch in long sit 30s x3 INV/EV stretch 30s x3   Therapeutic Activity: Fwd step ups 4" step, 10x B, lateral step ups 4" step, 10x B   [facility] Adult PT Treatment:                                                DATE: 09/18/21 Therapeutic Exercise: Nustep L3 seat 4, no arms, 8 min R ankle circles, 30 CW/CCW with 3# weight on forefoot PF stretch in long sit 30s x3 Marching on airex alternating, 30x  Therapeutic Activity: Rocker board A/P, 30x with UE support Step ups into rocker board, up R, down L, 15x Rocker board M/L 30x with UE support mCTSIB holding all 4 positions for 30s but WS onto LLE to accomodate R  foot pain  [facility] Adult PT Treatment:                                                DATE: 09/14/21 Therapeutic Exercise: Nustep L2 seat 4, no arms, 7 min R ankle circles, 30 CW/CCW with 2# weight on forefoot PF stretch in long sit 30s x3  Therapeutic Activity: Rocker board A/P, 30x with UE support Rocker board M/L 30x with UE support Marching on Airex 30x with UE support   DATE: 09/12/21 Therapeutic Exercise: Nustep L2 seat 4, arms 6, 6 min Rocker board A/P AROM 30x R ankle circles, 30 CW/CCW PF stretch in long sit 30s x3 Manual Therapy: N/a Neuromuscular re-ed: Airex 30s EO, EC, normal and tandem stances for a total of 6 bouts Marching from Airex Therapeutic Activity: N/a Modalities: N/a Self Care: N/a      PATIENT EDUCATION:  Education details: instructed in HEP as well as desentization techniques Person educated: Patient Education method: Explanation and Handouts Education comprehension: verbalized understanding and needs further education     HOME EXERCISE PROGRAM: Access Code: [email or web address] URL: [email or web address] Date: 08/29/2021 Prepared by: Gustavus Bryant   Exercises  Long Sitting Plantar Fascia Stretch with Towel - 2 x daily - 7 x weekly - 1 sets - 3 reps - 30s hold Towel Scrunches - 2 x daily - 7 x weekly - 1 sets - 1 reps - 60s hold Long Sitting Calf Stretch with Strap - 2 x daily - 7 x weekly - 1 sets - 3 reps - 30s hold     ASSESSMENT:   CLINICAL IMPRESSION: Continued pain as main issue, overall ROM is WFL, tolerating treatment w/o exacerbating symptoms but pain remains unrelenting  REHAB POTENTIAL: fair   CLINICAL DECISION MAKING: Evolving/moderate complexity   EVALUATION COMPLEXITY: Moderate     GOALS: Goals reviewed with patient? Yes   SHORT TERM GOALS:   STG Name Target Date Goal status  1 STGs=LTGs Baseline:  09/21/2021 INITIAL    LTG Name Target Date Goal status  1 Patient to demonstrate independence in HEP     Baseline:Access Code: [email or web address] URL: [email or web address] Date: 08/29/2021 Prepared by: Gustavus Bryant   Exercises Long Sitting Plantar Fascia Stretch with Towel - 2 x daily - 7 x weekly - 1 sets - 3 reps - 30s hold Towel Scrunches - 2 x daily - 7 x weekly - 1 sets - 1 reps - 60s hold Long Sitting Calf Stretch with Strap - 2 x daily - 7 x weekly - 1 sets - 3 reps - 30s hold   09/21/2021 09/22/21 Demos independence in [place], goal met  2 Increase AROM DF to 7d Baseline: 4d AROM DF; 09/22/21 9/18d A/PROM 09/21/2021 09/22/21 Goal met  3 Increase R ankle strength to 4/5 Baseline:3/5; 09/22/21 4/5 in DF/PF 09/21/2021 INITIAL  4 Patient to demo 30s SLS on R Baseline:Unable to tolerate 09/21/2021 INITIAL    PT FREQUENCY: 2x/week   PT DURATION: 4 weeks   PLANNED INTERVENTIONS: Therapeutic exercises, Therapeutic activity, Neuro Muscular re-education, Balance training, Gait training, Patient/Family education, Joint mobilization, Stair training, and desentization techniques   PLAN FOR NEXT SESSION: 6th visit FOTO, functional testing(DGI, FGA)    Farris Has Bani Gianfrancesco PT 09/22/2021, 10:30 AM

## 2021-09-25 ENCOUNTER — Ambulatory Visit: Payer: Medicare HMO

## 2021-09-25 ENCOUNTER — Other Ambulatory Visit: Payer: Self-pay

## 2021-09-25 DIAGNOSIS — M6281 Muscle weakness (generalized): Secondary | ICD-10-CM | POA: Diagnosis not present

## 2021-09-25 DIAGNOSIS — M25571 Pain in right ankle and joints of right foot: Secondary | ICD-10-CM

## 2021-09-25 DIAGNOSIS — R2689 Other abnormalities of gait and mobility: Secondary | ICD-10-CM | POA: Diagnosis not present

## 2021-09-25 DIAGNOSIS — M25671 Stiffness of right ankle, not elsewhere classified: Secondary | ICD-10-CM | POA: Diagnosis not present

## 2021-09-25 NOTE — Therapy (Signed)
OUTPATIENT PHYSICAL THERAPY TREATMENT NOTE   Patient Name: Danielle [name] MRN: [id number] DOB:1971-07-19, 51 y.o., female Today's Date: 09/25/2021  PCP: Jordan Hawks, [place] REFERRING PROVIDER: Conrad [place], PA-C   PT End of Session - 09/25/21 0919     Visit Number 6    Number of Visits 8    Date for PT Re-Evaluation 09/19/21    Authorization Type medpay    Progress Note Due on Visit 8    PT Start Time 0915    PT Stop Time 1000    PT Time Calculation (min) 45 min    Activity Tolerance Patient limited by pain    Behavior During Therapy [facility] for tasks assessed/performed             Past Medical History:  Diagnosis Date   Abnormal uterine bleeding (AUB)    Achalasia    Type 2   Anemia    Anxiety    Asthma    Bipolar disorder (HCC)    Mixed   Bulging lumbar disc    Chronic back pain    Condyloma acuminata    Depression    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   Ganglion cyst    Right hand   Genital HSV    GERD (gastroesophageal reflux disease)    Hepatitis    States she got a shot for this   HIV infection ([place])    Migraines    Pneumonia    Pre-diabetes    Trichimoniasis    Urinary incontinence    Uterine fibroid    Wears glasses    Past Surgical History:  Procedure Laterality Date   ABDOMINAL HYSTERECTOMY N/A 06/28/2015   Procedure: HYSTERECTOMY ABDOMINAL;  Surgeon: Osborne Oman, MD;  Location: [place] [facility];  Service: Gynecology;  Laterality: N/A;  Requested 06/28/15 @ 7:30a   ANKLE ARTHROSCOPY Right 10/16/2017   Procedure: RIGHT ANKLE ARTHROSCOPIC DEBRIDEMENT;  Surgeon: Newt Minion, MD;  Location: [place];  Service: Orthopedics;  Laterality: Right;   ARTHRODESIS METATARSALPHALANGEAL JOINT (MTPJ) Right 07/23/2018   Procedure: RIGHT GREAT TOE METATARSOPHALANGEAL JOINT FUSION;  Surgeon: Newt Minion, MD;  Location: [place];  Service: Orthopedics;  Laterality: Right;   BILATERAL SALPINGECTOMY Bilateral 06/28/2015   Procedure: BILATERAL SALPINGECTOMY;  Surgeon:  Osborne Oman, MD;  Location: [place] [facility];  Service: Gynecology;  Laterality: Bilateral;   CESAREAN SECTION     twice;  vertical incision x 2   COLONOSCOPY N/A 10/12/2013   Procedure: COLONOSCOPY;  Surgeon: Jerene Bears, MD;  [facility];  Service: Endoscopy;  Laterality: N/A;   CYSTO WITH HYDRODISTENSION N/A 05/27/2018   Procedure: CYSTOSCOPY/HYDRODISTENSION AND INSTILLATION;  Surgeon: Bjorn Loser, MD;  Location: [facility];  Service: Urology;  Laterality: N/A;   ESOPHAGEAL MANOMETRY N/A 06/29/2013   Procedure: ESOPHAGEAL MANOMETRY (EM);  Surgeon: Jerene Bears, MD;  [facility];  Service: Gastroenterology;  Laterality: N/A;   FOREIGN BODY REMOVAL Left 2018   fiberglass, left thumb   HELLER MYOTOMY N/A 10/08/2013   Procedure: LAPAROSCOPIC HELLER MYOTOMY, DOR FUNDIPLICATION, UPPER ENDOSCOPY;  Surgeon: Odis Hollingshead, MD;  Location[facility];  Service: [name];  Laterality: N/A;   HERNIA REPAIR     Umbilical   HYSTEROSCOPY WITH RESECTOSCOPE N/A 05/03/2015   Procedure: Diagnostic Hysteroscopy;  Surgeon: Lavonia Drafts, MD;  Location: [place] [facility];  Service: Gynecology;  Laterality: N/A;  wants Novasure and myosure.   LYSIS OF ADHESION  06/28/2015   Procedure: LYSIS OF ADHESION;  Surgeon: Osborne Oman, MD;  Location: [place] [facility];  Service: Gynecology;;   OTHER SURGICAL HISTORY     biopsy axillary ,right arm   right arm biopsy     UPPER GI ENDOSCOPY N/A 10/08/2013   Procedure: UPPER GI ENDOSCOPY;  Surgeon: Odis Hollingshead, MD;  Location[facility];  Service: [name];  Laterality: N/A;   WISDOM TOOTH EXTRACTION     WRIST ARTHROSCOPY WITH DEBRIDEMENT Right 06/06/2021   Procedure: ARTHROSCOPY WRIST, DEBRIDEMENT TFCC, SYNOVECTOMY AND  EXTENSO CARPI ULNARIS  REPAIR;  Surgeon: Renette Butters, MD;  Location: [place];  Service: Orthopedics;  Laterality: Right;   Patient Active Problem List   Diagnosis Date Noted   Obesity (BMI 30-39.9)  02/02/2020   Hallux rigidus, right foot 03/11/2018   Achilles tendon contracture, right 03/11/2018   Impingement syndrome of right ankle    Sprain of calcaneofibular ligament of right ankle 07/04/2017   Ankle pain, right 06/24/2017   Hyperglycemia 02/18/2017   Hepatitis B immune 02/18/2017   Fibroadenoma of breast 02/18/2017   S/P total abdominal hysterectomy 06/28/2015   Condyloma acuminatum 02/23/2015   Urinary incontinence 01/05/2015   Abnormal finding on GI tract imaging 10/12/2013   Anemia-chronic 10/09/2013   Achalasia s/p laparoscopic Heller myotomy and Dor fundoplication 16/06/9603   Ganglion cyst of finger of right hand 07/06/2013   Trichomoniasis of vagina 06/03/2013   GERD (gastroesophageal reflux disease) 03/16/2013   Depression 02/10/2009   Human immunodeficiency virus (HIV) disease ([place]) 03/24/2007   Anxiety state 03/24/2007   ASTHMA 03/24/2007    REFERRING DIAG: Pain in right ankle and joints of right foot  Muscle weakness (generalized)  Other abnormalities of gait and mobility  THERAPY DIAG:  Pain in right ankle and joints of right foot  Muscle weakness (generalized)  Other abnormalities of gait and mobility  PERTINENT HISTORY: see eval  PRECAUTIONS: none  SUBJECTIVE: Cold weather aggravating ankle pain and symptoms  PAIN:  Are you having pain? 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       POSTURE:  WFL   PALPATION: Exquisitely tender to dorsum of R foot, good DP PT pulses   LE AROM/PROM:   A/PROM Right 10/02/21 Left 08/31/2021  Hip  flexion      Hip extension      Hip abduction      Hip adduction      Hip internal rotation      Hip external rotation      Knee flexion      Knee extension      Ankle dorsiflexion 9/18 10  Ankle plantarflexion [facility] WFL  Ankle inversion [facility]  Ankle eversion WFL WFL   (Blank rows = not tested)   LE MMT:   MMT Right 09/22/21 Left 08/31/2021  Hip flexion      Hip extension      Hip abduction      Hip  adduction      Hip internal rotation      Hip external rotation      Knee flexion      Knee extension      Ankle dorsiflexion 4 5  Ankle plantarflexion 4 5  Ankle inversion 3+ 5  Ankle eversion 3+ 5   (Blank rows = not tested)     FUNCTIONAL TESTS:  MCTSIB: Condition 1: Avg of 3 trials: 30 sec, Condition 2: Avg of 3 trials: 30 sec, Condition 3: Avg of 3 trials: 30 sec, Condition 4: Avg of 3 trials: 30x4=120 sec, and Total Score: 120/120   GAIT: Distance walked: 100 Assistive device utilized: None Level of assistance: Complete Independence Comments:        TODAY'S TREATMENT: [facility] Adult PT Treatment:                                                DATE: 09/25/21 Therapeutic Exercise: Nustep L4 seat 4, no arms, 8 min R ankle circles, 30 CW/CCW with 3# weight on forefoot(unable to tolerate 4# today due to pain) PF stretch in long sit 30s x3 INV/EV stretch 30s x3    09/25/21 0001  Functional Gait  Assessment  Gait assessed  Yes  Gait Level Surface 1  Change in Gait Speed 2  Gait with Horizontal Head Turns 2  Gait with Vertical Head Turns 2  Gait and Pivot Turn 3  Step Over Obstacle 0  Gait with Narrow Base of Support 2  Gait with Eyes Closed 1  Ambulating Backwards 2  Steps 1  Total Score 16/30    [facility] Adult PT Treatment:                                                DATE: 09/22/21 Therapeutic Exercise: Nustep L4 seat 4, no arms, 8 min R ankle circles, 30 CW/CCW with 3# weight on forefoot PF stretch in long sit 30s x3 INV/EV stretch 30s x3   Therapeutic  Activity: Fwd step ups 4" step, 10x B, lateral step ups 4" step, 10x B   [facility] Adult PT Treatment:                                                DATE: 09/18/21 Therapeutic Exercise: Nustep L3 seat 4, no arms, 8 min R ankle circles, 30 CW/CCW with 3# weight on forefoot PF stretch in long sit 30s x3 Marching on airex alternating, 30x  Therapeutic Activity: Rocker board A/P, 30x with UE support Step ups into rocker board, up R, down L, 15x Rocker board M/L 30x with UE support mCTSIB holding all 4 positions for 30s but WS onto LLE to accomodate R foot pain  [facility] Adult PT Treatment:                                                DATE: 09/14/21 Therapeutic Exercise: Nustep L2 seat 4, no arms, 7 min R ankle  circles, 30 CW/CCW with 2# weight on forefoot PF stretch in long sit 30s x3  Therapeutic Activity: Rocker board A/P, 30x with UE support Rocker board M/L 30x with UE support Marching on Airex 30x with UE support   DATE: 09/12/21 Therapeutic Exercise: Nustep L2 seat 4, arms 6, 6 min Rocker board A/P AROM 30x R ankle circles, 30 CW/CCW PF stretch in long sit 30s x3 Manual Therapy: N/a Neuromuscular re-ed: Airex 30s EO, EC, normal and tandem stances for a total of 6 bouts Marching from Airex Therapeutic Activity: N/a Modalities: N/a Self Care: N/a      PATIENT EDUCATION:  Education details: instructed in HEP as well as desentization techniques Person educated: Patient Education method: Explanation and Handouts Education comprehension: verbalized understanding and needs further education     HOME EXERCISE PROGRAM: Access Code: [email or web address] URL: [email or web address] Date: 08/29/2021 Prepared by: Sharlynn Oliphant   Exercises Long Sitting Plantar Fascia Stretch with Towel - 2 x daily - 7 x weekly - 1 sets - 3 reps - 30s hold Towel Scrunches - 2 x daily - 7 x weekly - 1 sets - 1 reps - 60s hold Long Sitting Calf Stretch with Strap - 2 x daily - 7 x weekly - 1  sets - 3 reps - 30s hold     ASSESSMENT:   CLINICAL IMPRESSION: No change in pain or function, ROM is [facility] but continued pain levels limit function, FGA score 16/30, 24/30 is cutoff for elevated fall risk, pain levels affecting balance and function at this time  REHAB POTENTIAL: fair   CLINICAL DECISION MAKING: Evolving/moderate complexity   EVALUATION COMPLEXITY: Moderate     GOALS: Goals reviewed with patient? Yes   SHORT TERM GOALS:   STG Name Target Date Goal status  1 STGs=LTGs Baseline:  09/21/2021 MET    LTG Name Target Date Goal status  1 Patient to demonstrate independence in HEP    Baseline:Access Code: [email or web address] URL: [email or web address] Date: 08/29/2021 Prepared by: Sharlynn Oliphant   Exercises Long Sitting Plantar Fascia Stretch with Towel - 2 x daily - 7 x weekly - 1 sets - 3 reps - 30s hold Towel Scrunches - 2 x daily - 7 x weekly - 1 sets - 1 reps - 60s hold Long Sitting Calf Stretch with Strap - 2 x daily - 7 x weekly - 1 sets - 3 reps - 30s hold   09/21/2021 09/22/21 Demos independence in [place], goal met  2 Increase AROM DF to 7d Baseline: 4d AROM DF; 09/22/21 9/18d A/PROM 09/21/2021 09/22/21 Goal met  3 Increase R ankle strength to 4/5 Baseline:3/5; 09/22/21 4/5 in DF/PF 09/21/2021 INITIAL  4 Patient to demo 30s SLS on R Baseline:Unable to tolerate 09/21/2021 INITIAL    PT FREQUENCY: 2x/week   PT DURATION: 4 weeks   PLANNED INTERVENTIONS: Therapeutic exercises, Therapeutic activity, Neuro Muscular re-education, Balance training, Gait training, Patient/Family education, Joint mobilization, Stair training, and desentization techniques   PLAN FOR NEXT SESSION:  functional testing(DGI, FGA), f/u from MD visit, assess progress towards LTGs    Lanice Shirts PT 09/25/2021, 9:20 AM

## 2021-09-26 DIAGNOSIS — G894 Chronic pain syndrome: Secondary | ICD-10-CM | POA: Diagnosis not present

## 2021-09-26 DIAGNOSIS — M47817 Spondylosis without myelopathy or radiculopathy, lumbosacral region: Secondary | ICD-10-CM | POA: Diagnosis not present

## 2021-09-26 DIAGNOSIS — M5117 Intervertebral disc disorders with radiculopathy, lumbosacral region: Secondary | ICD-10-CM | POA: Diagnosis not present

## 2021-09-26 DIAGNOSIS — G90529 Complex regional pain syndrome I of unspecified lower limb: Secondary | ICD-10-CM | POA: Diagnosis not present

## 2021-09-27 DIAGNOSIS — S6981XD Other specified injuries of right wrist, hand and finger(s), subsequent encounter: Secondary | ICD-10-CM | POA: Diagnosis not present

## 2021-09-27 DIAGNOSIS — R29898 Other symptoms and signs involving the musculoskeletal system: Secondary | ICD-10-CM | POA: Diagnosis not present

## 2021-09-27 DIAGNOSIS — M25631 Stiffness of right wrist, not elsewhere classified: Secondary | ICD-10-CM | POA: Diagnosis not present

## 2021-09-27 DIAGNOSIS — M25531 Pain in right wrist: Secondary | ICD-10-CM | POA: Diagnosis not present

## 2021-09-28 ENCOUNTER — Ambulatory Visit: Payer: Medicare HMO

## 2021-09-28 ENCOUNTER — Other Ambulatory Visit: Payer: Self-pay

## 2021-09-28 DIAGNOSIS — M25571 Pain in right ankle and joints of right foot: Secondary | ICD-10-CM

## 2021-09-28 DIAGNOSIS — R2689 Other abnormalities of gait and mobility: Secondary | ICD-10-CM | POA: Diagnosis not present

## 2021-09-28 DIAGNOSIS — M25631 Stiffness of right wrist, not elsewhere classified: Secondary | ICD-10-CM | POA: Diagnosis not present

## 2021-09-28 DIAGNOSIS — M6281 Muscle weakness (generalized): Secondary | ICD-10-CM | POA: Diagnosis not present

## 2021-09-28 DIAGNOSIS — M25671 Stiffness of right ankle, not elsewhere classified: Secondary | ICD-10-CM | POA: Diagnosis not present

## 2021-09-28 DIAGNOSIS — R29898 Other symptoms and signs involving the musculoskeletal system: Secondary | ICD-10-CM | POA: Diagnosis not present

## 2021-09-28 DIAGNOSIS — S6981XD Other specified injuries of right wrist, hand and finger(s), subsequent encounter: Secondary | ICD-10-CM | POA: Diagnosis not present

## 2021-09-28 DIAGNOSIS — M25531 Pain in right wrist: Secondary | ICD-10-CM | POA: Diagnosis not present

## 2021-09-28 NOTE — Therapy (Signed)
OUTPATIENT PHYSICAL THERAPY TREATMENT NOTE   Patient Name: Danielle [name] MRN: [id number] DOB:[birth year], 51 y.o., female Today's Date: 09/28/2021  PCP: Jordan Hawks, [place] REFERRING PROVIDER: Conrad Lassen, PA-C   PT End of Session - 09/28/21 1157     Visit Number 7    Number of Visits 8    Date for PT Re-Evaluation 09/19/21    Authorization Type medpay    Progress Note Due on Visit 8    PT Start Time 1215    PT Stop Time 1300    PT Time Calculation (min) 45 min    Activity Tolerance Patient limited by pain    Behavior During Therapy [facility] for tasks assessed/performed             Past Medical History:  Diagnosis Date   Abnormal uterine bleeding (AUB)    Achalasia    Type 2   Anemia    Anxiety    Asthma    Bipolar disorder (HCC)    Mixed   Bulging lumbar disc    Chronic back pain    Condyloma acuminata    Depression    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   Ganglion cyst    Right hand   Genital HSV    GERD (gastroesophageal reflux disease)    Hepatitis    States she got a shot for this   HIV infection ([place])    Migraines    Pneumonia    Pre-diabetes    Trichimoniasis    Urinary incontinence    Uterine fibroid    Wears glasses    Past Surgical History:  Procedure Laterality Date   ABDOMINAL HYSTERECTOMY N/A 06/28/2015   Procedure: HYSTERECTOMY ABDOMINAL;  Surgeon: Osborne Oman, MD;  Location: [place] [facility];  Service: Gynecology;  Laterality: N/A;  Requested 06/28/15 @ 7:30a   ANKLE ARTHROSCOPY Right 10/16/2017   Procedure: RIGHT ANKLE ARTHROSCOPIC DEBRIDEMENT;  Surgeon: Newt Minion, MD;  Location: [place];  Service: Orthopedics;  Laterality: Right;   ARTHRODESIS METATARSALPHALANGEAL JOINT (MTPJ) Right 07/23/2018   Procedure: RIGHT GREAT TOE METATARSOPHALANGEAL JOINT FUSION;  Surgeon: Newt Minion, MD;  Location: [place];  Service: Orthopedics;  Laterality: Right;   BILATERAL SALPINGECTOMY Bilateral 06/28/2015   Procedure: BILATERAL SALPINGECTOMY;  Surgeon:  Osborne Oman, MD;  Location: [facility];  Service: Gynecology;  Laterality: Bilateral;   CESAREAN SECTION     twice;  vertical incision x 2   COLONOSCOPY N/A 10/12/2013   Procedure: COLONOSCOPY;  Surgeon: Jerene Bears, MD;  Location: [facility];  Service: Endoscopy;  Laterality: N/A;   CYSTO WITH HYDRODISTENSION N/A 05/27/2018   Procedure: CYSTOSCOPY/HYDRODISTENSION AND INSTILLATION;  Surgeon: Bjorn Loser, MD;  Location: [facility];  Service: Urology;  Laterality: N/A;   ESOPHAGEAL MANOMETRY N/A 06/29/2013   Procedure: ESOPHAGEAL MANOMETRY (EM);  Surgeon: Jerene Bears, MD;  Location: [facility];  Service: Gastroenterology;  Laterality: N/A;   FOREIGN BODY REMOVAL Left 2018   fiberglass, left thumb   HELLER MYOTOMY N/A 10/08/2013   Procedure: LAPAROSCOPIC HELLER MYOTOMY, DOR FUNDIPLICATION, UPPER ENDOSCOPY;  Surgeon: Odis Hollingshead, MD;  Location: [facility];  Service: General;  Laterality: N/A;   HERNIA REPAIR     Umbilical   HYSTEROSCOPY WITH RESECTOSCOPE N/A 05/03/2015   Procedure: Diagnostic Hysteroscopy;  Surgeon: Lavonia Drafts, MD;  Location: [facility];  Service: Gynecology;  Laterality: N/A;  wants Novasure and myosure.   LYSIS OF ADHESION  06/28/2015   Procedure: LYSIS OF ADHESION;  Surgeon: Osborne Oman, MD;  Location: [facility];  Service: Gynecology;;   OTHER SURGICAL HISTORY     biopsy axillary ,right arm   right arm biopsy     UPPER GI ENDOSCOPY N/A 10/08/2013   Procedure: UPPER GI ENDOSCOPY;  Surgeon: Odis Hollingshead, MD;  Location: [facility];  Service: General;  Laterality: N/A;   WISDOM TOOTH EXTRACTION     WRIST ARTHROSCOPY WITH DEBRIDEMENT Right 06/06/2021   Procedure: ARTHROSCOPY WRIST, DEBRIDEMENT TFCC, SYNOVECTOMY AND  EXTENSO CARPI ULNARIS  REPAIR;  Surgeon: Renette Butters, MD;  Location: [place];  Service: Orthopedics;  Laterality: Right;   Patient Active Problem List   Diagnosis Date Noted   Obesity (BMI 30-39.9)  02/02/2020   Hallux rigidus, right foot 03/11/2018   Achilles tendon contracture, right 03/11/2018   Impingement syndrome of right ankle    Sprain of calcaneofibular ligament of right ankle 07/04/2017   Ankle pain, right 06/24/2017   Hyperglycemia 02/18/2017   Hepatitis B immune 02/18/2017   Fibroadenoma of breast 02/18/2017   S/P total abdominal hysterectomy 06/28/2015   Condyloma acuminatum 02/23/2015   Urinary incontinence 01/05/2015   Abnormal finding on GI tract imaging 10/12/2013   Anemia-chronic 10/09/2013   Achalasia s/p laparoscopic Heller myotomy and Dor fundoplication 00/86/7619   Ganglion cyst of finger of right hand 07/06/2013   Trichomoniasis of vagina 06/03/2013   GERD (gastroesophageal reflux disease) 03/16/2013   Depression 02/10/2009   Human immunodeficiency virus (HIV) disease ([place]) 03/24/2007   Anxiety state 03/24/2007   ASTHMA 03/24/2007    REFERRING DIAG: Pain in right ankle and joints of right foot  Muscle weakness (generalized)  Other abnormalities of gait and mobility  THERAPY DIAG:  Other abnormalities of gait and mobility  Muscle weakness (generalized)  Pain in right ankle and joints of right foot  PERTINENT HISTORY: see eval  PRECAUTIONS: none  SUBJECTIVE: No changes to note regarding ankle pain and function, continued pain levels of 4-7/10, worse with activities involving DF such as driving and stair climbing  PAIN:  Are you having pain? 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  POSTURE:  WFL   PALPATION: Exquisitely  tender to dorsum of R foot, good DP PT pulses   LE AROM/PROM:   A/PROM Right 10/02/21 Left 08/31/2021  Hip flexion      Hip extension      Hip abduction      Hip adduction      Hip internal rotation      Hip external rotation      Knee flexion      Knee extension      Ankle dorsiflexion 9/18 10  Ankle plantarflexion [facility] WFL  Ankle inversion [facility]  Ankle eversion WFL WFL   (Blank rows = not tested)   LE MMT:   MMT Right 09/22/21 Left 08/31/2021  Hip flexion      Hip extension  Hip abduction      Hip adduction      Hip internal rotation      Hip external rotation      Knee flexion      Knee extension      Ankle dorsiflexion 4 5  Ankle plantarflexion 4 5  Ankle inversion 4 5  Ankle eversion 4 5   (Blank rows = not tested)     FUNCTIONAL TESTS:  MCTSIB: Condition 1: Avg of 3 trials: 30 sec, Condition 2: Avg of 3 trials: 30 sec, Condition 3: Avg of 3 trials: 30 sec, Condition 4: Avg of 3 trials: 30x4=120 sec, and Total Score: 120/120   GAIT: Distance walked: 100 Assistive device utilized: None Level of assistance: Complete Independence Comments:        TODAY'S TREATMENT: [facility] Adult PT Treatment:                                                DATE: 09/28/21 Therapeutic Exercise: Nustep L4 seat 4, no arms, 8 min Therapeutic Activity:    09/28/21 0001  Berg Balance Test  Sit to Stand 4  Standing Unsupported 4  Sitting with Back Unsupported but Feet Supported on Floor or Stool 4  Stand to Sit 4  Transfers 4  Standing Unsupported with Eyes Closed 4  Standing Unsupported with Feet Together 4  From Standing, Reach Forward with Outstretched Arm 4  From Standing Position, Pick up Object from Floor 4  From Standing Position, Turn to Look Behind Over each Shoulder 4  Turn 360 Degrees 4  Standing Unsupported, Alternately Place Feet on Step/Stool 4  Standing Unsupported, One Foot in Front 3  Standing on One Leg 4  Total Score 55    Dynamic Gait Index   Level Surface 3  Change in Gait Speed 3  Gait with Horizontal Head Turns 3  Gait with Vertical Head Turns 3  Gait and Pivot Turn 3  Step Over Obstacle 2  Step Around Obstacles 3  Steps 2  Total Score 22      [facility] Adult PT Treatment:                                                DATE: 09/25/21 Therapeutic Exercise: Nustep L4 seat 4, no arms, 8 min R ankle circles, 30 CW/CCW with 3# weight on forefoot(unable to tolerate 4# today due to pain) PF stretch in long sit 30s x3 INV/EV stretch 30s x3    09/25/21 0001  Functional Gait  Assessment  Gait assessed  Yes  Gait Level Surface 1  Change in Gait Speed 2  Gait with Horizontal Head Turns 2  Gait with Vertical Head Turns 2  Gait and Pivot Turn 3  Step Over Obstacle 0  Gait with Narrow Base of Support 2  Gait with Eyes Closed 1  Ambulating Backwards 2  Steps 1  Total Score 16/30    [facility] Adult PT Treatment:  DATE: 09/22/21 Therapeutic Exercise: Nustep L4 seat 4, no arms, 8 min R ankle circles, 30 CW/CCW with 3# weight on forefoot PF stretch in long sit 30s x3 INV/EV stretch 30s x3   Therapeutic Activity: Fwd step ups 4" step, 10x B, lateral step ups 4" step, 10x B   [facility] Adult PT Treatment:                                                DATE: 09/18/21 Therapeutic Exercise: Nustep L3 seat 4, no arms, 8 min R ankle circles, 30 CW/CCW with 3# weight on forefoot PF stretch in long sit 30s x3 Marching on airex alternating, 30x  Therapeutic Activity: Rocker board A/P, 30x with UE support Step ups into rocker board, up R, down L, 15x Rocker board M/L 30x with UE support mCTSIB holding all 4 positions for 30s but WS onto LLE to accomodate R foot pain  [facility] Adult PT Treatment:                                                DATE: 09/14/21 Therapeutic Exercise: Nustep L2 seat 4, no arms, 7 min R ankle circles, 30 CW/CCW with 2# weight on forefoot PF stretch in long sit 30s  x3  Therapeutic Activity: Rocker board A/P, 30x with UE support Rocker board M/L 30x with UE support Marching on Airex 30x with UE support     PATIENT EDUCATION:  Education details: instructed in HEP as well as desentization techniques Person educated: Patient Education method: Explanation and Handouts Education comprehension: verbalized understanding and needs further education     HOME EXERCISE PROGRAM: Access Code: [email or web address] URL: [email or web address] Date: 08/29/2021 Prepared by: Sharlynn Oliphant   Exercises Long Sitting Plantar Fascia Stretch with Towel - 2 x daily - 7 x weekly - 1 sets - 3 reps - 30s hold Towel Scrunches - 2 x daily - 7 x weekly - 1 sets - 1 reps - 60s hold Long Sitting Calf Stretch with Strap - 2 x daily - 7 x weekly - 1 sets - 3 reps - 30s hold     ASSESSMENT:   CLINICAL IMPRESSION: Todays session assessed balance, function and fall risk.  DGI and BERG scores were well WFL and above cutoff for fall risk of 19 and 48 respectively  REHAB POTENTIAL: fair   CLINICAL DECISION MAKING: Evolving/moderate complexity   EVALUATION COMPLEXITY: Moderate     GOALS: Goals reviewed with patient? Yes   SHORT TERM GOALS:   STG Name Target Date Goal status  1 STGs=LTGs Baseline:  09/21/2021 MET    LTG Name Target Date Goal status  1 Patient to demonstrate independence in HEP    Baseline:Access Code: [email or web address] URL: [email or web address] Date: 08/29/2021 Prepared by: Sharlynn Oliphant   Exercises Long Sitting Plantar Fascia Stretch with Towel - 2 x daily - 7 x weekly - 1 sets - 3 reps - 30s hold Towel Scrunches - 2 x daily - 7 x weekly - 1 sets - 1 reps - 60s hold Long Sitting Calf Stretch with Strap - 2 x daily - 7 x weekly - 1 sets - 3 reps - 30s hold   09/21/2021 09/22/21 Demos independence in [place], goal met  2 Increase AROM DF to 7d Baseline: 4d AROM DF; 09/22/21 9/18d A/PROM 09/21/2021 09/22/21 Goal met  3 Increase R ankle  strength to 4/5 Baseline:3/5; 09/22/21 4/5 in DF/PF 09/21/2021 INITIAL  4 Patient to demo 30s SLS on R Baseline:Unable to tolerate 09/21/2021 INITIAL    PT FREQUENCY: 2x/week   PT DURATION: 4 weeks   PLANNED INTERVENTIONS: Therapeutic exercises, Therapeutic activity, Neuro Muscular re-education, Balance training, Gait training, Patient/Family education, Joint mobilization, Stair training, and desentization techniques   PLAN FOR NEXT SESSION:  Assess LTGs and anticipate DC to HEP    Lanice Shirts PT 09/28/2021, 11:58 AM

## 2021-10-03 ENCOUNTER — Ambulatory Visit: Payer: Medicare HMO

## 2021-10-03 ENCOUNTER — Other Ambulatory Visit: Payer: Self-pay

## 2021-10-03 DIAGNOSIS — M25571 Pain in right ankle and joints of right foot: Secondary | ICD-10-CM | POA: Diagnosis not present

## 2021-10-03 DIAGNOSIS — M25531 Pain in right wrist: Secondary | ICD-10-CM | POA: Diagnosis not present

## 2021-10-03 DIAGNOSIS — R29898 Other symptoms and signs involving the musculoskeletal system: Secondary | ICD-10-CM | POA: Diagnosis not present

## 2021-10-03 DIAGNOSIS — R2689 Other abnormalities of gait and mobility: Secondary | ICD-10-CM

## 2021-10-03 DIAGNOSIS — M6281 Muscle weakness (generalized): Secondary | ICD-10-CM | POA: Diagnosis not present

## 2021-10-03 DIAGNOSIS — M25671 Stiffness of right ankle, not elsewhere classified: Secondary | ICD-10-CM | POA: Diagnosis not present

## 2021-10-03 DIAGNOSIS — S6981XD Other specified injuries of right wrist, hand and finger(s), subsequent encounter: Secondary | ICD-10-CM | POA: Diagnosis not present

## 2021-10-03 DIAGNOSIS — M25631 Stiffness of right wrist, not elsewhere classified: Secondary | ICD-10-CM | POA: Diagnosis not present

## 2021-10-03 NOTE — Therapy (Signed)
OUTPATIENT PHYSICAL THERAPY TREATMENT NOTE/DC Summary   Patient Name: Danielle [name] MRN: [id number] DOB:[birth year], 51 y.o., female Today's Date: 10/03/2021  PCP: Jordan Hawks, FNP REFERRING PROVIDER: Jordan Hawks, [place]  PHYSICAL THERAPY DISCHARGE SUMMARY  Visits from Start of Care: 8  Current functional level related to goals / functional outcomes: Goals met o rpatient at maximum functional level.   Remaining deficits: Pain   Education / Equipment: HEP   Patient agrees to discharge. Patient goals were partially met. Patient is being discharged due to maximized rehab potential.    PT End of Session - 10/03/21 1308     Visit Number 8    Number of Visits 8    Date for PT Re-Evaluation 09/19/21    Authorization Type medpay    Progress Note Due on Visit 8    PT Start Time 1315    PT Stop Time 1400    PT Time Calculation (min) 45 min    Activity Tolerance Patient limited by pain    Behavior During Therapy WFL for tasks assessed/performed             Past Medical History:  Diagnosis Date   Abnormal uterine bleeding (AUB)    Achalasia    Type 2   Anemia    Anxiety    Asthma    Bipolar disorder (HCC)    Mixed   Bulging lumbar disc    Chronic back pain    Condyloma acuminata    Depression    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   Ganglion cyst    Right hand   Genital HSV    GERD (gastroesophageal reflux disease)    Hepatitis    States she got a shot for this   HIV infection ([place])    Migraines    Pneumonia    Pre-diabetes    Trichimoniasis    Urinary incontinence    Uterine fibroid    Wears glasses    Past Surgical History:  Procedure Laterality Date   ABDOMINAL HYSTERECTOMY N/A 06/28/2015   Procedure: HYSTERECTOMY ABDOMINAL;  Surgeon: Osborne Oman, MD;  Location: [facility];  Service: Gynecology;  Laterality: N/A;  Requested 06/28/15 @ 7:30a   ANKLE ARTHROSCOPY Right 10/16/2017   Procedure: RIGHT ANKLE ARTHROSCOPIC DEBRIDEMENT;  Surgeon: Newt Minion, MD;  Location: [place];  Service: Orthopedics;  Laterality: Right;   ARTHRODESIS METATARSALPHALANGEAL JOINT (MTPJ) Right 07/23/2018   Procedure: RIGHT GREAT TOE METATARSOPHALANGEAL JOINT FUSION;  Surgeon: Newt Minion, MD;  Location: [place];  Service: Orthopedics;  Laterality: Right;   BILATERAL SALPINGECTOMY Bilateral 06/28/2015   Procedure: BILATERAL SALPINGECTOMY;  Surgeon: Osborne Oman, MD;  Location: [facility];  Service: Gynecology;  Laterality: Bilateral;   CESAREAN SECTION     twice;  vertical incision x 2   COLONOSCOPY N/A 10/12/2013   Procedure: COLONOSCOPY;  Surgeon: Jerene Bears, MD;  Location: [facility];  Service: Endoscopy;  Laterality: N/A;   CYSTO WITH HYDRODISTENSION N/A 05/27/2018   Procedure: CYSTOSCOPY/HYDRODISTENSION AND INSTILLATION;  Surgeon: Bjorn Loser, MD;  Location: [facility];  Service: Urology;  Laterality: N/A;   ESOPHAGEAL MANOMETRY N/A 06/29/2013   Procedure: ESOPHAGEAL MANOMETRY (EM);  Surgeon: Jerene Bears, MD;  Location: [facility];  Service: Gastroenterology;  Laterality: N/A;   FOREIGN BODY REMOVAL Left 2018   fiberglass, left thumb   HELLER MYOTOMY N/A 10/08/2013   Procedure: LAPAROSCOPIC HELLER MYOTOMY, DOR FUNDIPLICATION, UPPER ENDOSCOPY;  Surgeon: Odis Hollingshead, MD;  Location: [facility];  Service: General;  Laterality: N/A;   HERNIA REPAIR     Umbilical   HYSTEROSCOPY WITH RESECTOSCOPE N/A 05/03/2015   Procedure: Diagnostic Hysteroscopy;  Surgeon: Lavonia Drafts, MD;  Location: [facility];  Service: Gynecology;  Laterality: N/A;  wants Novasure and myosure.   LYSIS OF ADHESION  06/28/2015   Procedure: LYSIS OF ADHESION;  Surgeon: Osborne Oman, MD;  Location: [facility];  Service: Gynecology;;   OTHER SURGICAL HISTORY     biopsy axillary ,right arm   right arm biopsy     UPPER GI ENDOSCOPY N/A 10/08/2013   Procedure: UPPER GI ENDOSCOPY;  Surgeon: Odis Hollingshead, MD;  Location: [facility];  Service: General;   Laterality: N/A;   WISDOM TOOTH EXTRACTION     WRIST ARTHROSCOPY WITH DEBRIDEMENT Right 06/06/2021   Procedure: ARTHROSCOPY WRIST, DEBRIDEMENT TFCC, SYNOVECTOMY AND  EXTENSO CARPI ULNARIS  REPAIR;  Surgeon: Renette Butters, MD;  Location: [place];  Service: Orthopedics;  Laterality: Right;   Patient Active Problem List   Diagnosis Date Noted   Obesity (BMI 30-39.9) 02/02/2020   Hallux rigidus, right foot 03/11/2018   Achilles tendon contracture, right 03/11/2018   Impingement syndrome of right ankle    Sprain of calcaneofibular ligament of right ankle 07/04/2017   Ankle pain, right 06/24/2017   Hyperglycemia 02/18/2017   Hepatitis B immune 02/18/2017   Fibroadenoma of breast 02/18/2017   S/P total abdominal hysterectomy 06/28/2015   Condyloma acuminatum 02/23/2015   Urinary incontinence 01/05/2015   Abnormal finding on GI tract imaging 10/12/2013   Anemia-chronic 10/09/2013   Achalasia s/p laparoscopic Heller myotomy and Dor fundoplication 38/06/1750   Ganglion cyst of finger of right hand 07/06/2013   Trichomoniasis of vagina 06/03/2013   GERD (gastroesophageal reflux disease) 03/16/2013   Depression 02/10/2009   Human immunodeficiency virus (HIV) disease ([place]) 03/24/2007   Anxiety state 03/24/2007   ASTHMA 03/24/2007    REFERRING DIAG: Pain in right ankle and joints of right foot  Muscle weakness (generalized)  Other abnormalities of gait and mobility  THERAPY DIAG:  Other abnormalities of gait and mobility  Muscle weakness (generalized)  Pain in right ankle and joints of right foot  PERTINENT HISTORY: see eval  PRECAUTIONS: none  SUBJECTIVE: 7/10 pain in R foot/ankle.  Reports insurance has declined spinal injection/nerve block procedure at this time.  PAIN:  Are you having pain? Yes VAS scale: 7/10 Pain location: foot Pain orientation: Right  PAIN TYPE: aching, burning, and throbbing Pain description: intermittent  Aggravating factors:  activity Relieving factors: inactivity       OBJECTIVE:    DIAGNOSTIC FINDINGS: unremarkable   PATIENT SURVEYS:  FOTO- not captured   COGNITION:          Overall cognitive status: Within functional limits for tasks assessed                        SENSATION:          UTA due to CRPS  POSTURE:  WFL   PALPATION: Exquisitely tender to dorsum of R foot, good DP PT pulses   LE AROM/PROM:   A/PROM Right 10/03/21 Left 08/31/2021  Hip flexion      Hip extension      Hip abduction      Hip adduction      Hip internal rotation      Hip external rotation      Knee flexion  Knee extension      Ankle dorsiflexion 10/20 10  Ankle plantarflexion [facility] WFL  Ankle inversion [facility]  Ankle eversion WFL WFL   (Blank rows = not tested)   LE MMT:   MMT Right 09/22/21 Left 08/31/2021  Hip flexion      Hip extension      Hip abduction      Hip adduction      Hip internal rotation      Hip external rotation      Knee flexion      Knee extension      Ankle dorsiflexion 4 5  Ankle plantarflexion 4 5  Ankle inversion 4 5  Ankle eversion 4 5   (Blank rows = not tested)     FUNCTIONAL TESTS:  MCTSIB: Condition 1: Avg of 3 trials: 30 sec, Condition 2: Avg of 3 trials: 30 sec, Condition 3: Avg of 3 trials: 30 sec, Condition 4: Avg of 3 trials: 30x4=120 sec, and Total Score: 120/120   GAIT: Distance walked: 200 Assistive device utilized: None Level of assistance: Complete Independence Comments:        TODAY'S TREATMENT: [facility] Adult PT Treatment:                                                DATE: 10/03/21 Therapeutic Exercise: Nustep L2 seat 4, no arms, 8 min Standing heel rocks 10x unsupported SLS R 4s best after several trials See LTGs for ROM/strength measurements FGA score 15/30, previous score 16/30  Therapeutic Activity:   10/03/21 0001  Functional Gait  Assessment  Gait assessed  Yes  Gait Level Surface 1  Change in Gait Speed 1  Gait with Horizontal  Head Turns 2  Gait with Vertical Head Turns 2  Gait and Pivot Turn 3  Step Over Obstacle 1  Gait with Narrow Base of Support 2  Gait with Eyes Closed 1  Ambulating Backwards 1  Steps 1  Total Score 15    [facility] Adult PT Treatment:                                                DATE: 09/28/21 Therapeutic Exercise: Nustep L4 seat 4, no arms, 8 min Therapeutic Activity:    09/28/21 0001  Berg Balance Test  Sit to Stand 4  Standing Unsupported 4  Sitting with Back Unsupported but Feet Supported on Floor or Stool 4  Stand to Sit 4  Transfers 4  Standing Unsupported with Eyes Closed 4  Standing Unsupported with Feet Together 4  From Standing, Reach Forward with Outstretched Arm 4  From Standing Position, Pick up Object from Floor 4  From Standing Position, Turn to Look Behind Over each Shoulder 4  Turn 360 Degrees 4  Standing Unsupported, Alternately Place Feet on Step/Stool 4  Standing Unsupported, One Foot in Front 3  Standing on One Leg 4  Total Score 55    Dynamic Gait Index  Level Surface 3  Change in Gait Speed 3  Gait with Horizontal Head Turns 3  Gait with Vertical Head Turns 3  Gait and Pivot Turn 3  Step Over Obstacle 2  Step Around Obstacles 3  Steps 2  Total Score 22  [facility] Adult PT Treatment:                                                DATE: 09/25/21 Therapeutic Exercise: Nustep L4 seat 4, no arms, 8 min R ankle circles, 30 CW/CCW with 3# weight on forefoot(unable to tolerate 4# today due to pain) PF stretch in long sit 30s x3 INV/EV stretch 30s x3    09/25/21 0001  Functional Gait  Assessment  Gait assessed  Yes  Gait Level Surface 1  Change in Gait Speed 2  Gait with Horizontal Head Turns 2  Gait with Vertical Head Turns 2  Gait and Pivot Turn 3  Step Over Obstacle 0  Gait with Narrow Base of Support 2  Gait with Eyes Closed 1  Ambulating Backwards 2  Steps 1  Total Score 16/30    [facility] Adult PT Treatment:                                                 DATE: 09/22/21 Therapeutic Exercise: Nustep L4 seat 4, no arms, 8 min R ankle circles, 30 CW/CCW with 3# weight on forefoot PF stretch in long sit 30s x3 INV/EV stretch 30s x3   Therapeutic Activity: Fwd step ups 4" step, 10x B, lateral step ups 4" step, 10x B   [facility] Adult PT Treatment:                                                DATE: 09/18/21 Therapeutic Exercise: Nustep L3 seat 4, no arms, 8 min R ankle circles, 30 CW/CCW with 3# weight on forefoot PF stretch in long sit 30s x3 Marching on airex alternating, 30x  Therapeutic Activity: Rocker board A/P, 30x with UE support Step ups into rocker board, up R, down L, 15x Rocker board M/L 30x with UE support mCTSIB holding all 4 positions for 30s but WS onto LLE to accomodate R foot pain  [facility] Adult PT Treatment:                                                DATE: 09/14/21 Therapeutic Exercise: Nustep L2 seat 4, no arms, 7 min R ankle circles, 30 CW/CCW with 2# weight on forefoot PF stretch in long sit 30s x3  Therapeutic Activity: Rocker board A/P, 30x with UE support Rocker board M/L 30x with UE support Marching on Airex 30x with UE support     PATIENT EDUCATION:  Education details: instructed in HEP as well as desentization techniques Person educated: Patient Education method: Explanation and Handouts Education comprehension: verbalized understanding and needs further education     HOME EXERCISE PROGRAM: Access Code: [email or web address] URL: [email or web address] Date: 08/29/2021 Prepared by: Sharlynn Oliphant   Exercises Long Sitting Plantar Fascia Stretch with Towel - 2 x daily - 7 x weekly - 1 sets - 3 reps - 30s hold Towel Scrunches - 2 x daily - 7 x weekly - 1 sets -  1 reps - 60s hold Long Sitting Calf Stretch with Strap - 2 x daily - 7 x weekly - 1 sets - 3 reps - 30s hold     ASSESSMENT:   CLINICAL IMPRESSION: No change in pain levels/symptoms or improvement in perceived function.   Expresses frustration at lack of progress and inability to receive spinal injection for pain control due to insurance denial(per patient report).  Rates R ankle/foot functio at 50%.  Patient has regained functional ROM and strength but persistent pain levels limit function and ability to improve high level balance scores.  No further PT recommended at this time,patient has regained maximum benefit.  REHAB POTENTIAL: fair   CLINICAL DECISION MAKING: Evolving/moderate complexity   EVALUATION COMPLEXITY: Moderate     GOALS: Goals reviewed with patient? Yes   SHORT TERM GOALS:   STG Name Target Date Goal status  1 STGs=LTGs Baseline:  09/21/2021 MET    LTG Name Target Date Goal status  1 Patient to demonstrate independence in HEP    Baseline:Access Code: [email or web address] URL: [email or web address] Date: 08/29/2021 Prepared by: Sharlynn Oliphant   Exercises Long Sitting Plantar Fascia Stretch with Towel - 2 x daily - 7 x weekly - 1 sets - 3 reps - 30s hold Towel Scrunches - 2 x daily - 7 x weekly - 1 sets - 1 reps - 60s hold Long Sitting Calf Stretch with Strap - 2 x daily - 7 x weekly - 1 sets - 3 reps - 30s hold   09/21/2021 09/22/21 Demos independence in [place], goal met  2 Increase AROM DF to 7d Baseline: 4d AROM DF; 09/22/21 9/18d A/PROM 09/21/2021 09/22/21 Goal met  3 Increase R ankle strength to 4/5 Baseline:3/5; 09/22/21 4/5 in DF/PF 09/21/2021 10/03/21 Goal met  4 Patient to demo 30s SLS on R Baseline:Unable to tolerate 09/21/2021 10/03/21 4s maximum hold, not met    PT FREQUENCY: 2x/week   PT DURATION: 4 weeks   PLANNED INTERVENTIONS: Therapeutic exercises, Therapeutic activity, Neuro Muscular re-education, Balance training, Gait training, Patient/Family education, Joint mobilization, Stair training, and desentization techniques   PLAN FOR NEXT SESSION:  DC to MD care    Lanice Shirts PT 10/03/2021, 1:09 PM

## 2021-10-05 DIAGNOSIS — R29898 Other symptoms and signs involving the musculoskeletal system: Secondary | ICD-10-CM | POA: Diagnosis not present

## 2021-10-05 DIAGNOSIS — S6981XD Other specified injuries of right wrist, hand and finger(s), subsequent encounter: Secondary | ICD-10-CM | POA: Diagnosis not present

## 2021-10-05 DIAGNOSIS — M25631 Stiffness of right wrist, not elsewhere classified: Secondary | ICD-10-CM | POA: Diagnosis not present

## 2021-10-08 DIAGNOSIS — E7849 Other hyperlipidemia: Secondary | ICD-10-CM | POA: Diagnosis not present

## 2021-10-08 DIAGNOSIS — I10 Essential (primary) hypertension: Secondary | ICD-10-CM | POA: Diagnosis not present

## 2021-10-08 DIAGNOSIS — G43009 Migraine without aura, not intractable, without status migrainosus: Secondary | ICD-10-CM | POA: Diagnosis not present

## 2021-10-10 DIAGNOSIS — M25631 Stiffness of right wrist, not elsewhere classified: Secondary | ICD-10-CM | POA: Diagnosis not present

## 2021-10-10 DIAGNOSIS — R29898 Other symptoms and signs involving the musculoskeletal system: Secondary | ICD-10-CM | POA: Diagnosis not present

## 2021-10-10 DIAGNOSIS — M25531 Pain in right wrist: Secondary | ICD-10-CM | POA: Diagnosis not present

## 2021-10-10 DIAGNOSIS — S6981XD Other specified injuries of right wrist, hand and finger(s), subsequent encounter: Secondary | ICD-10-CM | POA: Diagnosis not present

## 2021-10-11 DIAGNOSIS — M65831 Other synovitis and tenosynovitis, right forearm: Secondary | ICD-10-CM | POA: Diagnosis not present

## 2021-10-12 DIAGNOSIS — R29898 Other symptoms and signs involving the musculoskeletal system: Secondary | ICD-10-CM | POA: Diagnosis not present

## 2021-10-12 DIAGNOSIS — M25531 Pain in right wrist: Secondary | ICD-10-CM | POA: Diagnosis not present

## 2021-10-12 DIAGNOSIS — S6981XD Other specified injuries of right wrist, hand and finger(s), subsequent encounter: Secondary | ICD-10-CM | POA: Diagnosis not present

## 2021-10-12 DIAGNOSIS — M25631 Stiffness of right wrist, not elsewhere classified: Secondary | ICD-10-CM | POA: Diagnosis not present

## 2021-10-17 DIAGNOSIS — R29898 Other symptoms and signs involving the musculoskeletal system: Secondary | ICD-10-CM | POA: Diagnosis not present

## 2021-10-17 DIAGNOSIS — M25531 Pain in right wrist: Secondary | ICD-10-CM | POA: Diagnosis not present

## 2021-10-17 DIAGNOSIS — S6981XD Other specified injuries of right wrist, hand and finger(s), subsequent encounter: Secondary | ICD-10-CM | POA: Diagnosis not present

## 2021-10-17 DIAGNOSIS — M25631 Stiffness of right wrist, not elsewhere classified: Secondary | ICD-10-CM | POA: Diagnosis not present

## 2021-10-30 DIAGNOSIS — M545 Low back pain, unspecified: Secondary | ICD-10-CM | POA: Diagnosis not present

## 2021-10-30 DIAGNOSIS — Z21 Asymptomatic human immunodeficiency virus [HIV] infection status: Secondary | ICD-10-CM | POA: Diagnosis not present

## 2021-10-30 DIAGNOSIS — M25539 Pain in unspecified wrist: Secondary | ICD-10-CM | POA: Diagnosis not present

## 2021-10-30 DIAGNOSIS — M25531 Pain in right wrist: Secondary | ICD-10-CM | POA: Diagnosis not present

## 2021-10-30 DIAGNOSIS — M5451 Vertebrogenic low back pain: Secondary | ICD-10-CM | POA: Diagnosis not present

## 2021-10-30 DIAGNOSIS — Z6832 Body mass index (BMI) 32.0-32.9, adult: Secondary | ICD-10-CM | POA: Diagnosis not present

## 2021-10-30 DIAGNOSIS — M79671 Pain in right foot: Secondary | ICD-10-CM | POA: Diagnosis not present

## 2021-11-10 DIAGNOSIS — R350 Frequency of micturition: Secondary | ICD-10-CM | POA: Diagnosis not present

## 2021-11-10 DIAGNOSIS — N3941 Urge incontinence: Secondary | ICD-10-CM | POA: Diagnosis not present

## 2021-11-21 DIAGNOSIS — Z79891 Long term (current) use of opiate analgesic: Secondary | ICD-10-CM | POA: Diagnosis not present

## 2021-11-21 DIAGNOSIS — M545 Low back pain, unspecified: Secondary | ICD-10-CM | POA: Diagnosis not present

## 2021-11-27 ENCOUNTER — Other Ambulatory Visit: Payer: Self-pay | Admitting: Infectious Diseases

## 2021-11-27 DIAGNOSIS — Z1231 Encounter for screening mammogram for malignant neoplasm of breast: Secondary | ICD-10-CM

## 2021-12-14 ENCOUNTER — Ambulatory Visit
Admission: RE | Admit: 2021-12-14 | Discharge: 2021-12-14 | Disposition: A | Discharge status: Home / Self Care | Payer: Medicare HMO | Source: Ambulatory Visit | Attending: Infectious Diseases | Admitting: Infectious Diseases

## 2021-12-14 DIAGNOSIS — Z1231 Encounter for screening mammogram for malignant neoplasm of breast: Secondary | ICD-10-CM

## 2021-12-19 DIAGNOSIS — Z79891 Long term (current) use of opiate analgesic: Secondary | ICD-10-CM | POA: Diagnosis not present

## 2021-12-19 DIAGNOSIS — M545 Low back pain, unspecified: Secondary | ICD-10-CM | POA: Diagnosis not present

## 2021-12-19 DIAGNOSIS — M792 Neuralgia and neuritis, unspecified: Secondary | ICD-10-CM | POA: Diagnosis not present

## 2021-12-29 ENCOUNTER — Ambulatory Visit (INDEPENDENT_AMBULATORY_CARE_PROVIDER_SITE_OTHER): Payer: Medicare HMO | Admitting: Infectious Diseases

## 2021-12-29 ENCOUNTER — Other Ambulatory Visit: Payer: Self-pay

## 2021-12-29 ENCOUNTER — Encounter: Payer: Self-pay | Admitting: Infectious Diseases

## 2021-12-29 VITALS — BP 127/86 | HR 78 | Temp 99.1°F | Wt 175.0 lb

## 2021-12-29 DIAGNOSIS — E669 Obesity, unspecified: Secondary | ICD-10-CM

## 2021-12-29 DIAGNOSIS — S93411A Sprain of calcaneofibular ligament of right ankle, initial encounter: Secondary | ICD-10-CM | POA: Diagnosis not present

## 2021-12-29 DIAGNOSIS — S6991XS Unspecified injury of right wrist, hand and finger(s), sequela: Secondary | ICD-10-CM | POA: Diagnosis not present

## 2021-12-29 DIAGNOSIS — D249 Benign neoplasm of unspecified breast: Secondary | ICD-10-CM

## 2021-12-29 DIAGNOSIS — Z79899 Other long term (current) drug therapy: Secondary | ICD-10-CM

## 2021-12-29 DIAGNOSIS — Z113 Encounter for screening for infections with a predominantly sexual mode of transmission: Secondary | ICD-10-CM | POA: Diagnosis not present

## 2021-12-29 DIAGNOSIS — B20 Human immunodeficiency virus [HIV] disease: Secondary | ICD-10-CM | POA: Diagnosis not present
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
## 2021-12-29 NOTE — Assessment & Plan Note (Addendum)
She is doing well ?No change in biktarvy ?Will check labs today ?rtc in 9 months ?Pap this fall ?Husband on prep.  ?

## 2021-12-29 NOTE — Assessment & Plan Note (Signed)
Most recent mammo (-) ?

## 2021-12-29 NOTE — Addendum Note (Signed)
Addended by: Adelfa Koh on: 12/29/2021 09:42 AM ? ? Modules accepted: Orders ? ?

## 2021-12-29 NOTE — Assessment & Plan Note (Signed)
Encouraged her to watch diet.  ?Not able to exercise due to injuries.  ?

## 2021-12-29 NOTE — Assessment & Plan Note (Signed)
Appreciate Dr Debroah Loop f/u, PT f/u.  ?

## 2021-12-29 NOTE — Assessment & Plan Note (Signed)
She will continue to f/u with ortho regarding this. ?

## 2022-01-01 ENCOUNTER — Other Ambulatory Visit: Payer: Self-pay | Admitting: Infectious Diseases

## 2022-01-01 DIAGNOSIS — B009 Herpesviral infection, unspecified: Secondary | ICD-10-CM

## 2022-01-01 NOTE — Telephone Encounter (Signed)
Ok to refill 

## 2022-01-03 LAB — COMPREHENSIVE METABOLIC PANEL
AG Ratio: 1.7 (calc) (ref 1.0–2.5)
ALT: 14 U/L (ref 6–29)
AST: 13 U/L (ref 10–35)
Albumin: 4.4 g/dL (ref 3.6–5.1)
Alkaline phosphatase (APISO): 118 U/L (ref 31–125)
BUN/Creatinine Ratio: 14 (calc) (ref 6–22)
BUN: 15 mg/dL (ref 7–25)
CO2: 30 mmol/L (ref 20–32)
Calcium: 9.3 mg/dL (ref 8.6–10.2)
Chloride: 107 mmol/L (ref 98–110)
Creat: 1.07 mg/dL — ABNORMAL HIGH (ref 0.50–0.99)
Globulin: 2.6 g/dL (calc) (ref 1.9–3.7)
Glucose, Bld: 99 mg/dL (ref 65–99)
Potassium: 3.8 mmol/L (ref 3.5–5.3)
Sodium: 141 mmol/L (ref 135–146)
Total Bilirubin: 0.4 mg/dL (ref 0.2–1.2)
Total Protein: 7 g/dL (ref 6.1–8.1)

## 2022-01-03 LAB — T-HELPER CELLS (CD4) COUNT (NOT AT ARMC)
Absolute CD4: 862 cells/uL (ref 490–1740)
CD4 T Helper %: 40 % (ref 30–61)
Total lymphocyte count: 2172 cells/uL (ref 850–3900)

## 2022-01-03 LAB — CBC
HCT: 40.6 % (ref 35.0–45.0)
Hemoglobin: 13.1 g/dL (ref 11.7–15.5)
MCH: 29.6 pg (ref 27.0–33.0)
MCHC: 32.3 g/dL (ref 32.0–36.0)
MCV: 91.9 fL (ref 80.0–100.0)
MPV: 11.4 fL (ref 7.5–12.5)
Platelets: 212 10*3/uL (ref 140–400)
RBC: 4.42 10*6/uL (ref 3.80–5.10)
RDW: 13.3 % (ref 11.0–15.0)
WBC: 4.3 10*3/uL (ref 3.8–10.8)

## 2022-01-03 LAB — HIV-1 RNA QUANT-NO REFLEX-BLD
HIV 1 RNA Quant: NOT DETECTED copies/mL
HIV-1 RNA Quant, Log: NOT DETECTED Log copies/mL

## 2022-01-03 LAB — RPR: RPR Ser Ql: NONREACTIVE

## 2022-01-07 ENCOUNTER — Other Ambulatory Visit: Payer: Self-pay | Admitting: Infectious Diseases

## 2022-01-07 DIAGNOSIS — B20 Human immunodeficiency virus [HIV] disease: Secondary | ICD-10-CM

## 2022-01-08 DIAGNOSIS — G5771 Causalgia of right lower limb: Secondary | ICD-10-CM | POA: Diagnosis not present

## 2022-01-09 ENCOUNTER — Other Ambulatory Visit (HOSPITAL_COMMUNITY)
Admission: RE | Admit: 2022-01-09 | Discharge: 2022-01-09 | Disposition: A | Discharge status: Home / Self Care | Payer: Medicare HMO | Source: Ambulatory Visit | Attending: Infectious Diseases | Admitting: Infectious Diseases

## 2022-01-09 ENCOUNTER — Ambulatory Visit (INDEPENDENT_AMBULATORY_CARE_PROVIDER_SITE_OTHER): Payer: Medicare HMO | Admitting: Infectious Diseases

## 2022-01-09 ENCOUNTER — Other Ambulatory Visit: Payer: Self-pay

## 2022-01-09 ENCOUNTER — Encounter: Payer: Self-pay | Admitting: Infectious Diseases

## 2022-01-09 VITALS — BP 129/85 | HR 89 | Temp 98.6°F | Wt 176.0 lb

## 2022-01-09 DIAGNOSIS — A630 Anogenital (venereal) warts: Secondary | ICD-10-CM | POA: Diagnosis not present

## 2022-01-09 DIAGNOSIS — B3731 Acute candidiasis of vulva and vagina: Secondary | ICD-10-CM | POA: Insufficient documentation

## 2022-01-09 DIAGNOSIS — Z124 Encounter for screening for malignant neoplasm of cervix: Secondary | ICD-10-CM | POA: Insufficient documentation

## 2022-01-09 DIAGNOSIS — Z1272 Encounter for screening for malignant neoplasm of vagina: Secondary | ICD-10-CM | POA: Diagnosis not present

## 2022-01-09 MED ORDER — FLUCONAZOLE 150 MG PO TABS: 150.0000 mg | ORAL_TABLET | Freq: Every day | ORAL | 0 refills | Status: DC

## 2022-01-09 NOTE — Assessment & Plan Note (Signed)
Mounded appearing condyloma with variable pigmentation. Not clear there is actually a hemorrhoid here or not. Non-bleeding, non-tender.  ?We discussed referral to general surgery for consideration of excision and further investigation regarding HRA. Referral to Dr. Marcello Moores made.  ?

## 2022-01-09 NOTE — Assessment & Plan Note (Signed)
Treat with diflucan 150 mg daily x 3 doses.  ?Follow up GC/CT, Trich swab.  ?

## 2022-01-09 NOTE — Patient Instructions (Addendum)
I will place a referral for you to see General Surgery to evaluate you and see if anything can be done surgically for the problem we discussed.  ? ?Will treat a likely yeast infection with diflucan 1 pink pill daily for 3 days.  ? ?Try to reach out in August about setting up a follow up with Dr. Johnnye Sima at his new clinic.  ? ?
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
## 2022-01-09 NOTE — Assessment & Plan Note (Signed)
She is s/p complete hysterectomy and cervix is surgically absent.  ?She had normal vaginal pap in 2020.  ?We discussed performing another vaginal pap today and if normal she can forego further testing in the future based on DHHS guidelines for screening in HIV+ women s/p hysterectomy.  ? ?Will need gen surgery follow up regarding peri-rectal HPV.  ?

## 2022-01-10 LAB — CYTOLOGY - PAP
Adequacy: ABSENT
Diagnosis: NEGATIVE

## 2022-01-23 DIAGNOSIS — Z79891 Long term (current) use of opiate analgesic: Secondary | ICD-10-CM | POA: Diagnosis not present

## 2022-01-23 DIAGNOSIS — M792 Neuralgia and neuritis, unspecified: Secondary | ICD-10-CM | POA: Diagnosis not present

## 2022-01-23 DIAGNOSIS — M47817 Spondylosis without myelopathy or radiculopathy, lumbosacral region: Secondary | ICD-10-CM | POA: Diagnosis not present

## 2022-01-29 DIAGNOSIS — B20 Human immunodeficiency virus [HIV] disease: Secondary | ICD-10-CM | POA: Diagnosis not present

## 2022-02-01 ENCOUNTER — Encounter: Payer: Self-pay | Admitting: Infectious Diseases

## 2022-02-21 DIAGNOSIS — M47816 Spondylosis without myelopathy or radiculopathy, lumbar region: Secondary | ICD-10-CM | POA: Diagnosis not present

## 2022-03-07 DIAGNOSIS — M47817 Spondylosis without myelopathy or radiculopathy, lumbosacral region: Secondary | ICD-10-CM | POA: Diagnosis not present

## 2022-03-07 DIAGNOSIS — G9050 Complex regional pain syndrome I, unspecified: Secondary | ICD-10-CM | POA: Diagnosis not present

## 2022-03-07 DIAGNOSIS — Z79891 Long term (current) use of opiate analgesic: Secondary | ICD-10-CM | POA: Diagnosis not present

## 2022-03-09 DIAGNOSIS — I10 Essential (primary) hypertension: Secondary | ICD-10-CM | POA: Diagnosis not present

## 2022-03-09 DIAGNOSIS — E7849 Other hyperlipidemia: Secondary | ICD-10-CM | POA: Diagnosis not present

## 2022-03-19 DIAGNOSIS — R7303 Prediabetes: Secondary | ICD-10-CM | POA: Diagnosis not present

## 2022-03-19 DIAGNOSIS — Z6833 Body mass index (BMI) 33.0-33.9, adult: Secondary | ICD-10-CM | POA: Diagnosis not present

## 2022-03-19 DIAGNOSIS — B20 Human immunodeficiency virus [HIV] disease: Secondary | ICD-10-CM | POA: Diagnosis not present

## 2022-03-19 DIAGNOSIS — E661 Drug-induced obesity: Secondary | ICD-10-CM | POA: Diagnosis not present

## 2022-03-19 DIAGNOSIS — I10 Essential (primary) hypertension: Secondary | ICD-10-CM | POA: Diagnosis not present

## 2022-03-19 DIAGNOSIS — G43101 Migraine with aura, not intractable, with status migrainosus: Secondary | ICD-10-CM | POA: Diagnosis not present

## 2022-03-19 DIAGNOSIS — E6609 Other obesity due to excess calories: Secondary | ICD-10-CM | POA: Diagnosis not present

## 2022-03-19 DIAGNOSIS — Z0000 Encounter for general adult medical examination without abnormal findings: Secondary | ICD-10-CM | POA: Diagnosis not present

## 2022-04-02 DIAGNOSIS — M79671 Pain in right foot: Secondary | ICD-10-CM | POA: Diagnosis not present

## 2022-04-02 DIAGNOSIS — R7303 Prediabetes: Secondary | ICD-10-CM | POA: Diagnosis not present

## 2022-04-02 DIAGNOSIS — M545 Low back pain, unspecified: Secondary | ICD-10-CM | POA: Diagnosis not present

## 2022-04-02 DIAGNOSIS — M25531 Pain in right wrist: Secondary | ICD-10-CM | POA: Diagnosis not present

## 2022-04-06 DIAGNOSIS — M545 Low back pain, unspecified: Secondary | ICD-10-CM | POA: Diagnosis not present

## 2022-04-06 DIAGNOSIS — M47817 Spondylosis without myelopathy or radiculopathy, lumbosacral region: Secondary | ICD-10-CM | POA: Diagnosis not present

## 2022-04-06 DIAGNOSIS — Z79891 Long term (current) use of opiate analgesic: Secondary | ICD-10-CM | POA: Diagnosis not present

## 2022-04-09 DIAGNOSIS — I10 Essential (primary) hypertension: Secondary | ICD-10-CM | POA: Diagnosis not present

## 2022-04-09 DIAGNOSIS — G43009 Migraine without aura, not intractable, without status migrainosus: Secondary | ICD-10-CM | POA: Diagnosis not present

## 2022-04-09 DIAGNOSIS — E7849 Other hyperlipidemia: Secondary | ICD-10-CM | POA: Diagnosis not present

## 2022-04-16 IMAGING — CR DG HIP (WITH OR WITHOUT PELVIS) 2-3V*R*
2 series · 2 of 2 positions shown · non-contrast
Comparison: None.

CLINICAL DATA: AVN.  Pain.

EXAM:
DG HIP (WITH OR WITHOUT PELVIS) 2-3V RIGHT

[t hip ap right]
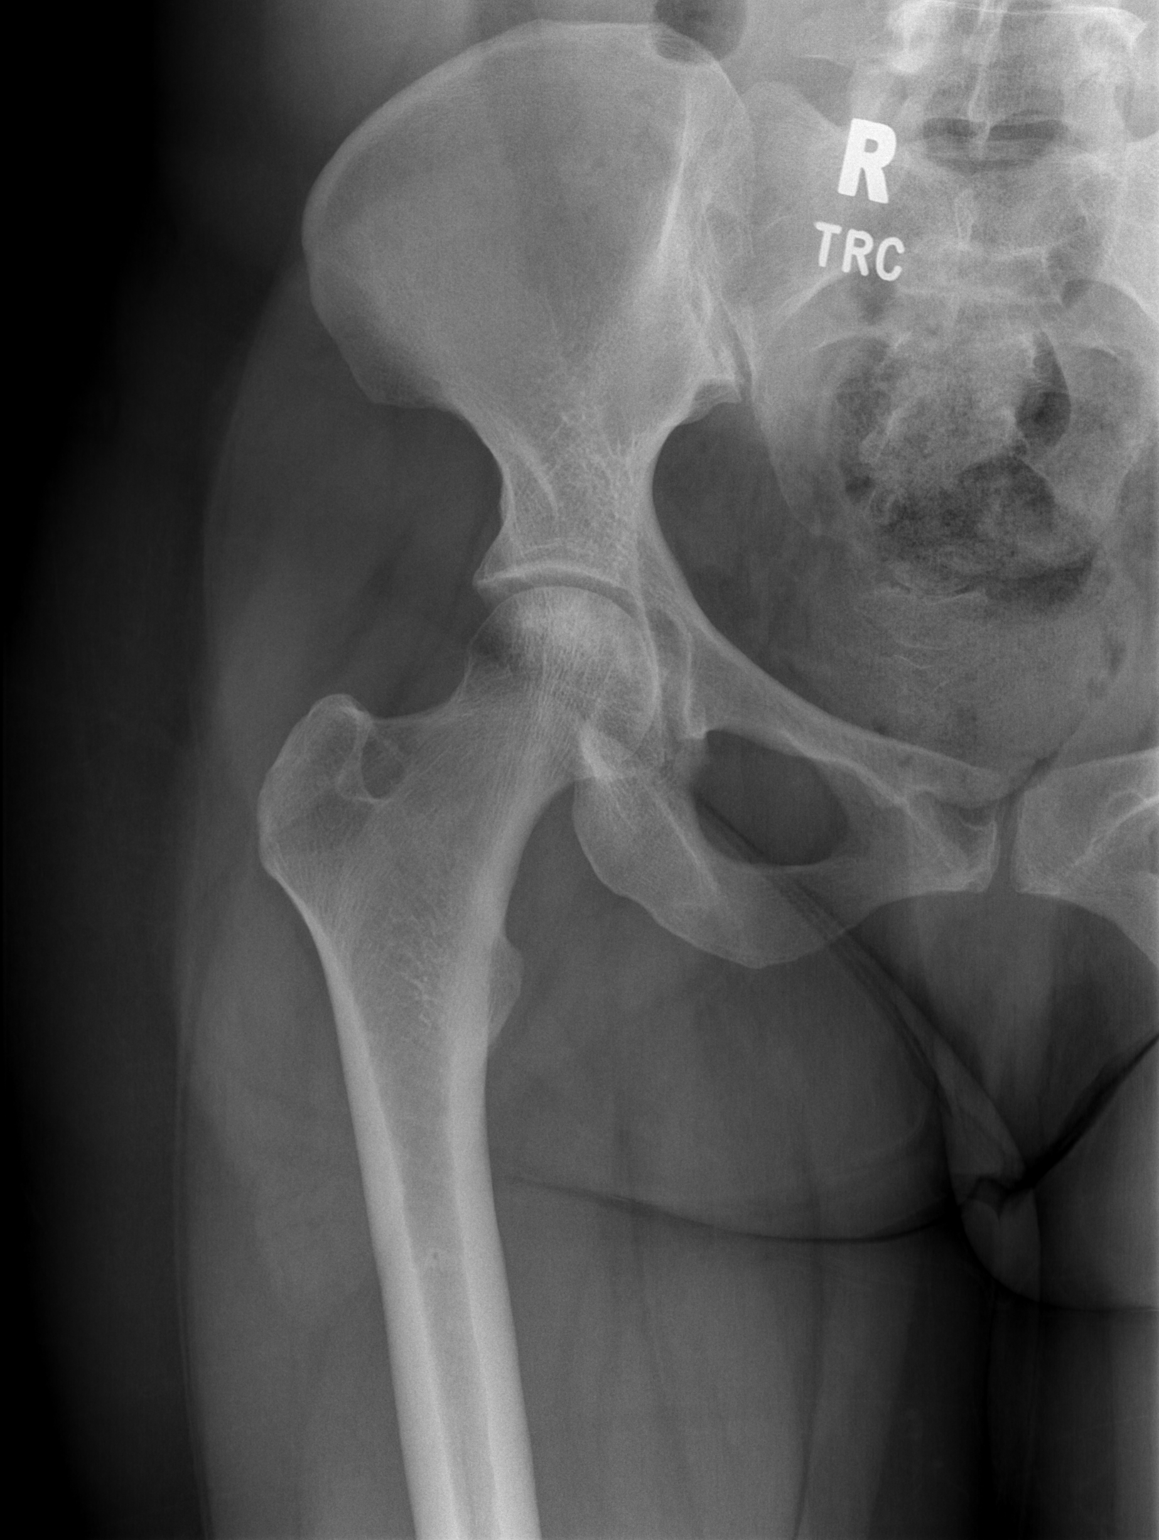

[t hip frog leg right]
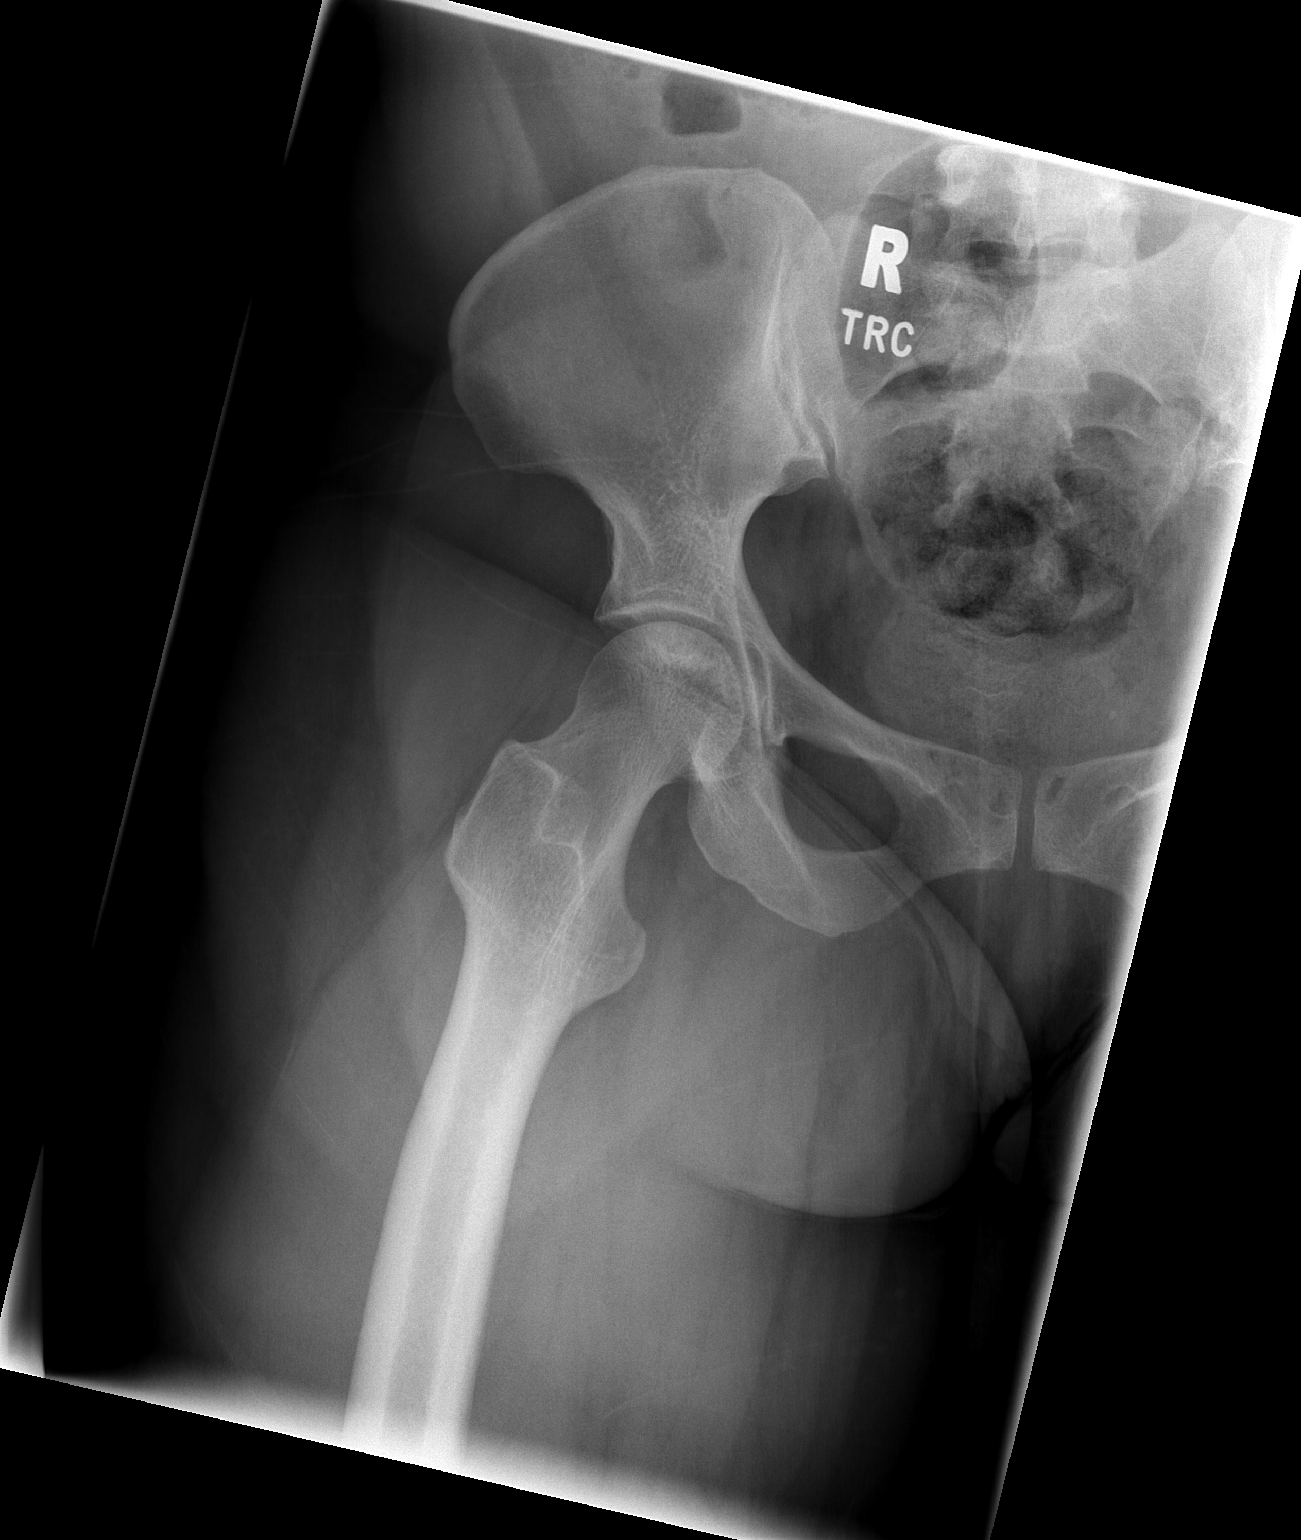

[2 of 2 positions shown; findings below may reference images not displayed]

FINDINGS: Sclerosis is seen in the right femoral head with subtle associated
subchondral lucency but no collapse.
IMPRESSION: Findings consistent with avascular necrosis in the right femoral
head with mild subchondral lucency but no collapse. No other acute
abnormalities. No fracture.

## 2022-04-16 IMAGING — CR DG HIP (WITH OR WITHOUT PELVIS) 2-3V*L*
3 series · 3 of 3 positions shown · non-contrast
Comparison: CT scan June 13, 2018

CLINICAL DATA: Secondary osteonecrosis. Low back and hip pain for 2
weeks.

EXAM:
DG HIP (WITH OR WITHOUT PELVIS) 2-3V LEFT

[t pelvis a.p.]
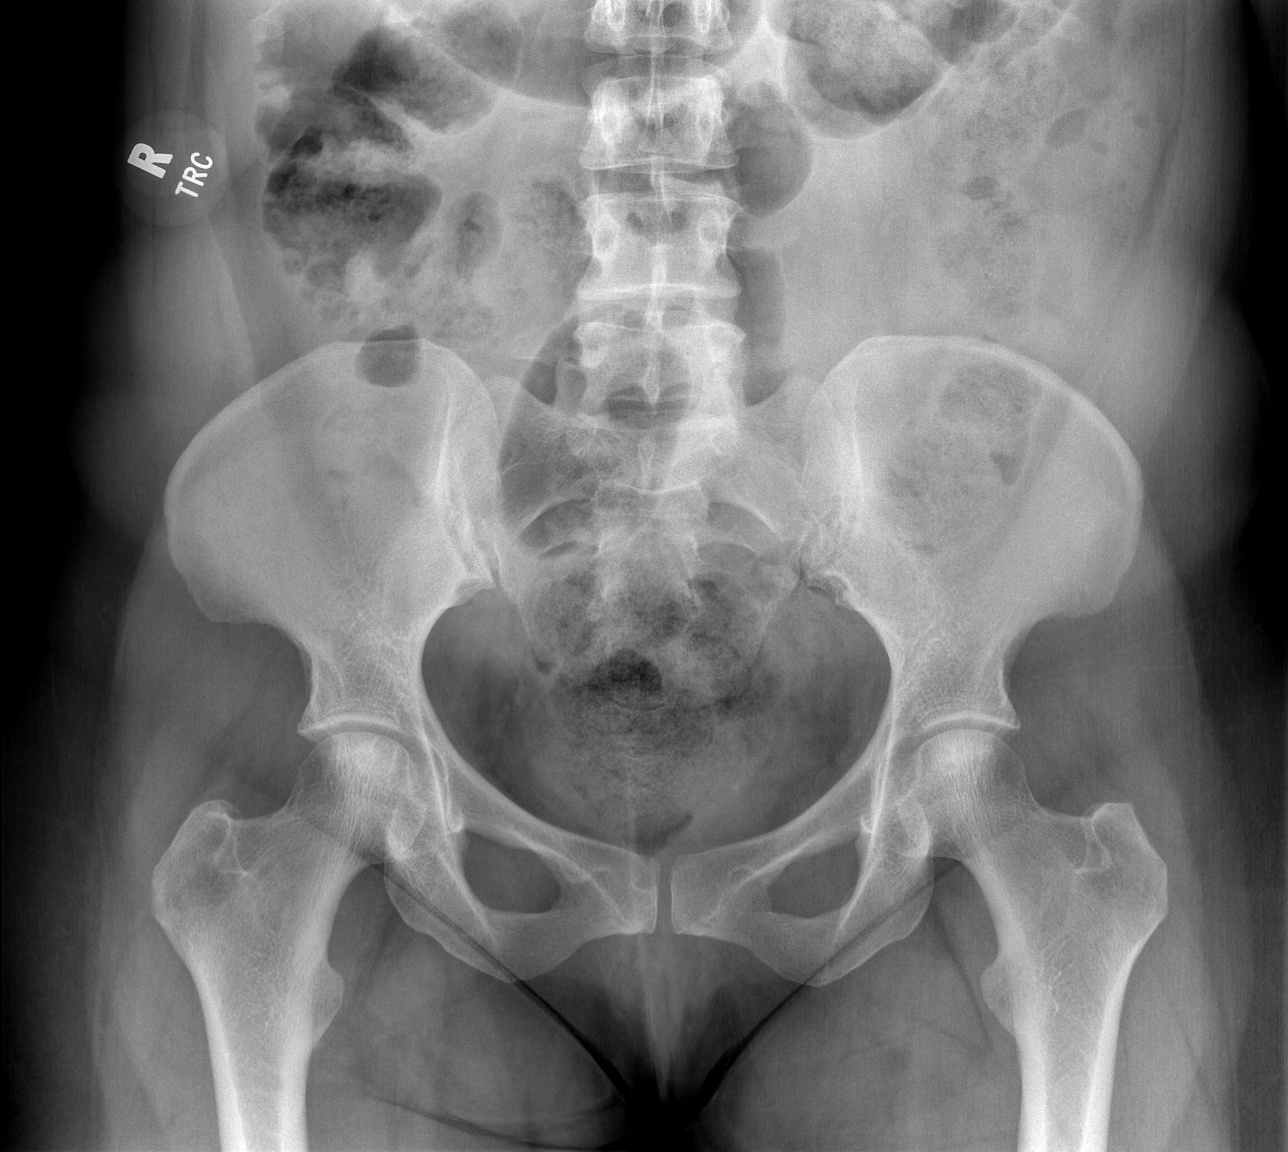

[t hip ap left]
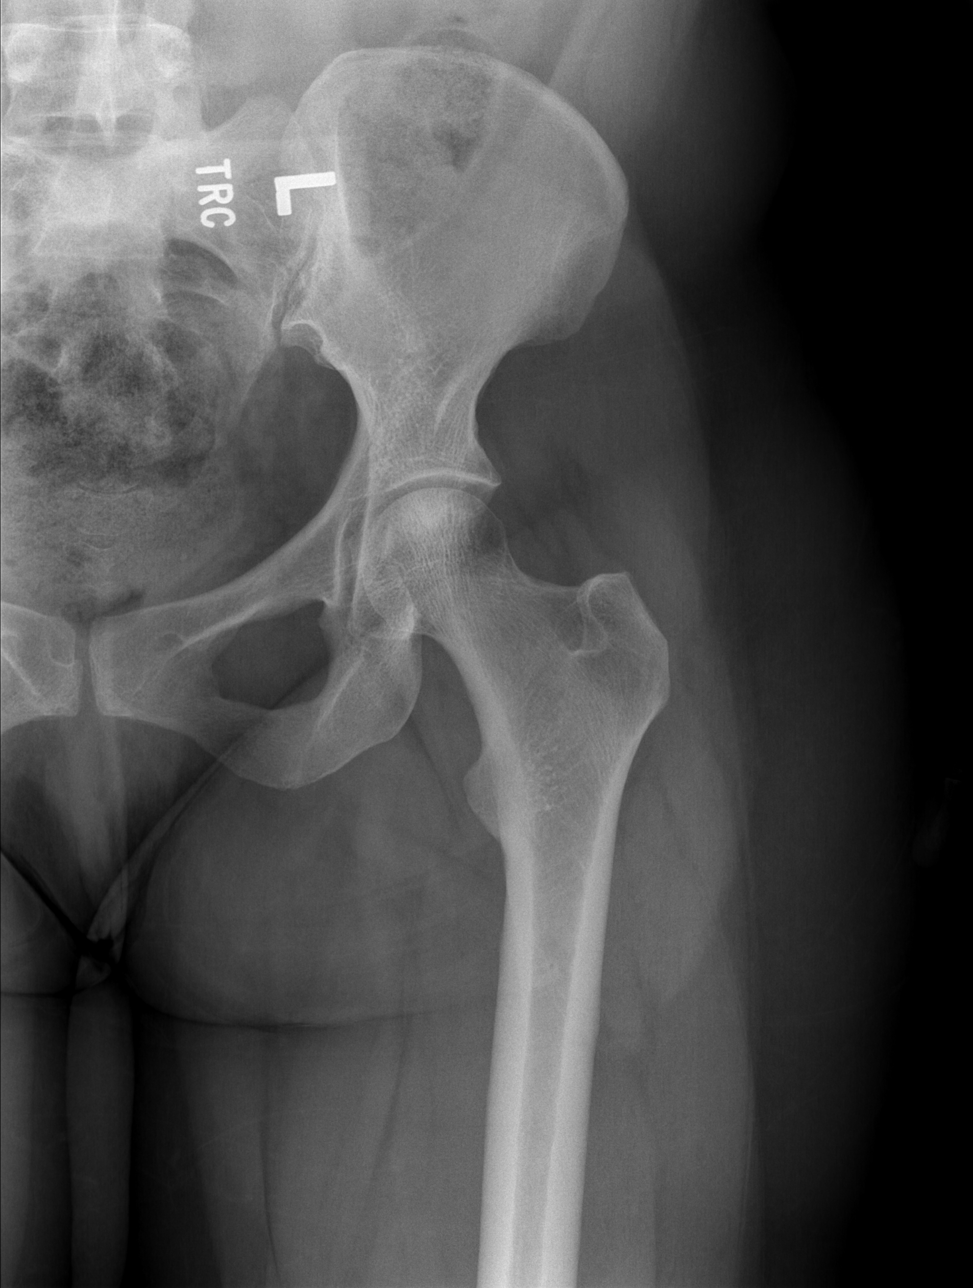

[t hip frog leg left]
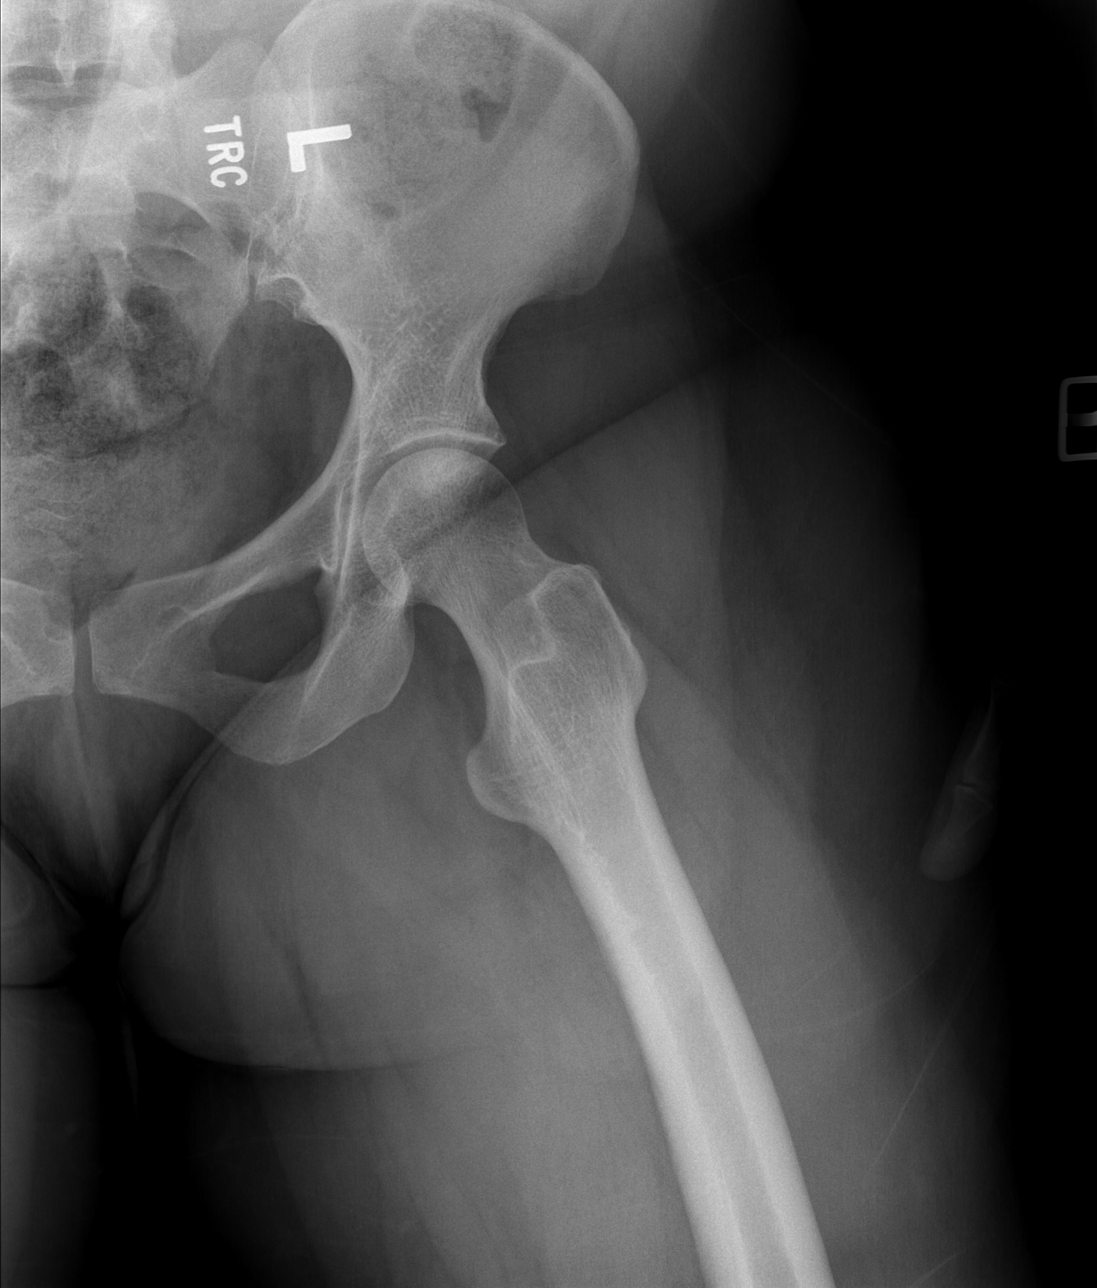

[3 of 3 positions shown; findings below may reference images not displayed]

FINDINGS: Mild sclerosis in the left femoral head is similar compared to the
previous CT scan. No associated collapse identified.

Mild sclerosis of the right femoral head is identified. There is
subtle subchondral lucency associated with the sclerosis. These
findings also appear to be unchanged since the June 13, 2018 CT
scan. There is no collapse of the right femoral head. No other bony
abnormalities.
IMPRESSION: 1. Bilateral avascular necrosis without collapse of the femoral
heads. Subchondral lucency on the right is identified, also present
on the comparison CT scan.
2. No acute fracture identified.

## 2022-04-24 ENCOUNTER — Ambulatory Visit (INDEPENDENT_AMBULATORY_CARE_PROVIDER_SITE_OTHER): Payer: Medicare HMO | Admitting: Infectious Diseases

## 2022-04-24 ENCOUNTER — Other Ambulatory Visit: Payer: Self-pay

## 2022-04-24 ENCOUNTER — Encounter: Payer: Self-pay | Admitting: Infectious Diseases

## 2022-04-24 VITALS — BP 121/73 | HR 80 | Temp 98.6°F | Wt 177.3 lb

## 2022-04-24 DIAGNOSIS — M25571 Pain in right ankle and joints of right foot: Secondary | ICD-10-CM | POA: Diagnosis not present

## 2022-04-24 DIAGNOSIS — Z79899 Other long term (current) drug therapy: Secondary | ICD-10-CM

## 2022-04-24 DIAGNOSIS — M549 Dorsalgia, unspecified: Secondary | ICD-10-CM | POA: Diagnosis not present

## 2022-04-24 DIAGNOSIS — Z6833 Body mass index (BMI) 33.0-33.9, adult: Secondary | ICD-10-CM

## 2022-04-24 DIAGNOSIS — E669 Obesity, unspecified: Secondary | ICD-10-CM

## 2022-04-24 DIAGNOSIS — B20 Human immunodeficiency virus [HIV] disease: Secondary | ICD-10-CM

## 2022-04-24 DIAGNOSIS — Z113 Encounter for screening for infections with a predominantly sexual mode of transmission: Secondary | ICD-10-CM | POA: Diagnosis not present

## 2022-04-24 DIAGNOSIS — G8929 Other chronic pain: Secondary | ICD-10-CM

## 2022-04-24 DIAGNOSIS — A630 Anogenital (venereal) warts: Secondary | ICD-10-CM

## 2022-04-24 NOTE — Assessment & Plan Note (Signed)
She is doing well Multiple concerns about her back and foot  Husband has been (-), on PREP. I encouraged her that he get tested every year.  rtc in 6 months.  Labs then.

## 2022-04-24 NOTE — Addendum Note (Signed)
Addended by: Vianey Caniglia C on: 04/24/2022 02:53 PM   Modules accepted: Orders
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
## 2022-04-24 NOTE — Assessment & Plan Note (Signed)
She is going to f/u with Dr Sharol Given.

## 2022-04-24 NOTE — Assessment & Plan Note (Signed)
Ask her to f/u with her pain. Her PCP is going to set her up with spine doctor.

## 2022-04-24 NOTE — Assessment & Plan Note (Signed)
Encouraged her to lose wt.  She does not want injections, has seen Dr Trixie Rude

## 2022-04-30 ENCOUNTER — Ambulatory Visit (INDEPENDENT_AMBULATORY_CARE_PROVIDER_SITE_OTHER): Payer: Medicare HMO | Admitting: Orthopedic Surgery

## 2022-04-30 ENCOUNTER — Ambulatory Visit (INDEPENDENT_AMBULATORY_CARE_PROVIDER_SITE_OTHER): Payer: Medicare HMO

## 2022-04-30 DIAGNOSIS — M545 Low back pain, unspecified: Secondary | ICD-10-CM | POA: Diagnosis not present

## 2022-04-30 DIAGNOSIS — S93324A Dislocation of tarsometatarsal joint of right foot, initial encounter: Secondary | ICD-10-CM

## 2022-04-30 DIAGNOSIS — M47817 Spondylosis without myelopathy or radiculopathy, lumbosacral region: Secondary | ICD-10-CM | POA: Diagnosis not present

## 2022-04-30 DIAGNOSIS — S93324S Dislocation of tarsometatarsal joint of right foot, sequela: Secondary | ICD-10-CM

## 2022-04-30 DIAGNOSIS — Z79891 Long term (current) use of opiate analgesic: Secondary | ICD-10-CM | POA: Diagnosis not present

## 2022-05-01 ENCOUNTER — Encounter: Payer: Self-pay | Admitting: Orthopedic Surgery
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
## 2022-05-10 DIAGNOSIS — I10 Essential (primary) hypertension: Secondary | ICD-10-CM | POA: Diagnosis not present

## 2022-05-10 DIAGNOSIS — E7849 Other hyperlipidemia: Secondary | ICD-10-CM | POA: Diagnosis not present

## 2022-05-10 DIAGNOSIS — G43009 Migraine without aura, not intractable, without status migrainosus: Secondary | ICD-10-CM | POA: Diagnosis not present

## 2022-05-24 ENCOUNTER — Encounter (HOSPITAL_COMMUNITY): Payer: Self-pay | Admitting: Orthopedic Surgery

## 2022-05-24 ENCOUNTER — Other Ambulatory Visit: Payer: Self-pay

## 2022-05-24 DIAGNOSIS — Z79891 Long term (current) use of opiate analgesic: Secondary | ICD-10-CM | POA: Diagnosis not present

## 2022-05-24 DIAGNOSIS — M545 Low back pain, unspecified: Secondary | ICD-10-CM | POA: Diagnosis not present

## 2022-05-24 DIAGNOSIS — M47817 Spondylosis without myelopathy or radiculopathy, lumbosacral region: Secondary | ICD-10-CM | POA: Diagnosis not present

## 2022-05-24 NOTE — Progress Notes (Signed)
Mrs Danielle [name] denies chest pain or shortness of breath.   Patient denies having any s/s of Covid in her household, also denies any known exposure to Covid.   Mrs Danielle [name] PCP is Jordan Hawks, FNP, Dr, Bobby Rumpf is patient 's infection disease MD.

## 2022-05-25 ENCOUNTER — Ambulatory Visit (HOSPITAL_BASED_OUTPATIENT_CLINIC_OR_DEPARTMENT_OTHER): Payer: Medicare HMO | Admitting: Registered Nurse

## 2022-05-25 ENCOUNTER — Encounter (HOSPITAL_COMMUNITY): Payer: Self-pay | Admitting: Orthopedic Surgery

## 2022-05-25 ENCOUNTER — Ambulatory Visit (HOSPITAL_COMMUNITY): Payer: Medicare HMO | Admitting: Registered Nurse

## 2022-05-25 ENCOUNTER — Encounter (HOSPITAL_COMMUNITY)
Admission: RE | Disposition: A | Discharge status: Home / Self Care | Payer: Self-pay | Source: Ambulatory Visit | Attending: Orthopedic Surgery

## 2022-05-25 ENCOUNTER — Other Ambulatory Visit: Payer: Self-pay

## 2022-05-25 ENCOUNTER — Ambulatory Visit (HOSPITAL_COMMUNITY)
Admission: RE | Admit: 2022-05-25 | Discharge: 2022-05-25 | Disposition: A | Discharge status: Home / Self Care | Payer: Medicare HMO | Source: Ambulatory Visit | Attending: Orthopedic Surgery | Admitting: Orthopedic Surgery

## 2022-05-25 ENCOUNTER — Ambulatory Visit (HOSPITAL_COMMUNITY): Payer: Medicare HMO

## 2022-05-25 DIAGNOSIS — S93324S Dislocation of tarsometatarsal joint of right foot, sequela: Secondary | ICD-10-CM

## 2022-05-25 DIAGNOSIS — Z21 Asymptomatic human immunodeficiency virus [HIV] infection status: Secondary | ICD-10-CM | POA: Insufficient documentation

## 2022-05-25 DIAGNOSIS — Z8619 Personal history of other infectious and parasitic diseases: Secondary | ICD-10-CM | POA: Diagnosis not present

## 2022-05-25 DIAGNOSIS — S93621A Sprain of tarsometatarsal ligament of right foot, initial encounter: Secondary | ICD-10-CM | POA: Insufficient documentation

## 2022-05-25 DIAGNOSIS — J45909 Unspecified asthma, uncomplicated: Secondary | ICD-10-CM | POA: Insufficient documentation

## 2022-05-25 DIAGNOSIS — S93622A Sprain of tarsometatarsal ligament of left foot, initial encounter: Secondary | ICD-10-CM | POA: Diagnosis not present

## 2022-05-25 DIAGNOSIS — S93324A Dislocation of tarsometatarsal joint of right foot, initial encounter: Secondary | ICD-10-CM

## 2022-05-25 DIAGNOSIS — K219 Gastro-esophageal reflux disease without esophagitis: Secondary | ICD-10-CM | POA: Diagnosis not present

## 2022-05-25 DIAGNOSIS — S93621S Sprain of tarsometatarsal ligament of right foot, sequela: Secondary | ICD-10-CM | POA: Diagnosis not present

## 2022-05-25 HISTORY — PX: FOOT ARTHRODESIS: SHX1655

## 2022-05-25 LAB — COMPREHENSIVE METABOLIC PANEL
ALT: 11 U/L (ref 0–44)
AST: 15 U/L (ref 15–41)
Albumin: 3.9 g/dL (ref 3.5–5.0)
Alkaline Phosphatase: 97 U/L (ref 38–126)
Anion gap: 7 (ref 5–15)
BUN: 12 mg/dL (ref 6–20)
CO2: 27 mmol/L (ref 22–32)
Calcium: 9.1 mg/dL (ref 8.9–10.3)
Chloride: 106 mmol/L (ref 98–111)
Creatinine, Ser: 1.16 mg/dL — ABNORMAL HIGH (ref 0.44–1.00)
GFR, Estimated: 58 mL/min — ABNORMAL LOW (ref >60)
Glucose, Bld: 109 mg/dL — ABNORMAL HIGH (ref 70–99)
Potassium: 3.6 mmol/L (ref 3.5–5.1)
Sodium: 140 mmol/L (ref 135–145)
Total Bilirubin: <0.1 mg/dL — ABNORMAL LOW (ref 0.3–1.2)
Total Protein: 6.6 g/dL (ref 6.5–8.1)

## 2022-05-25 LAB — CBC
HCT: 37.7 % (ref 36.0–46.0)
Hemoglobin: 12.4 g/dL (ref 12.0–15.0)
MCH: 30.5 pg (ref 26.0–34.0)
MCHC: 32.9 g/dL (ref 30.0–36.0)
MCV: 92.6 fL (ref 80.0–100.0)
Platelets: 187 10*3/uL (ref 150–400)
RBC: 4.07 MIL/uL (ref 3.87–5.11)
RDW: 13.3 % (ref 11.5–15.5)
WBC: 5.1 10*3/uL (ref 4.0–10.5)
nRBC: 0 % (ref 0.0–0.2)

## 2022-05-25 SURGERY — FUSION, JOINT, FOOT
Anesthesia: General | Site: Foot | Laterality: Right

## 2022-05-25 MED ORDER — PROPOFOL 10 MG/ML IV BOLUS
INTRAVENOUS | Status: AC
  Filled 2022-05-25: qty 20

## 2022-05-25 MED ORDER — ONDANSETRON HCL 4 MG/2ML IJ SOLN
INTRAMUSCULAR | Status: DC | PRN
  Administered 2022-05-25: 4 mg via INTRAVENOUS

## 2022-05-25 MED ORDER — LIDOCAINE 2% (20 MG/ML) 5 ML SYRINGE
INTRAMUSCULAR | Status: DC | PRN
  Administered 2022-05-25: 60 mg via INTRAVENOUS

## 2022-05-25 MED ORDER — DEXAMETHASONE SODIUM PHOSPHATE 10 MG/ML IJ SOLN
INTRAMUSCULAR | Status: DC | PRN
  Administered 2022-05-25: 10 mg via INTRAVENOUS

## 2022-05-25 MED ORDER — CHLORHEXIDINE GLUCONATE 0.12 % MT SOLN
15.0000 mL | Freq: Once | OROMUCOSAL | Status: AC
  Administered 2022-05-25: 15 mL via OROMUCOSAL
  Filled 2022-05-25: qty 15

## 2022-05-25 MED ORDER — LACTATED RINGERS IV SOLN: INTRAVENOUS | Status: DC

## 2022-05-25 MED ORDER — FENTANYL CITRATE (PF) 250 MCG/5ML IJ SOLN
INTRAMUSCULAR | Status: DC | PRN
  Administered 2022-05-25: 100 ug via INTRAVENOUS

## 2022-05-25 MED ORDER — FENTANYL CITRATE (PF) 100 MCG/2ML IJ SOLN
25.0000 ug | INTRAMUSCULAR | Status: DC | PRN
  Administered 2022-05-25 (×2): 50 ug via INTRAVENOUS

## 2022-05-25 MED ORDER — FENTANYL CITRATE (PF) 100 MCG/2ML IJ SOLN
INTRAMUSCULAR | Status: AC
  Filled 2022-05-25: qty 2

## 2022-05-25 MED ORDER — 0.9 % SODIUM CHLORIDE (POUR BTL) OPTIME
TOPICAL | Status: DC | PRN
  Administered 2022-05-25: 1000 mL

## 2022-05-25 MED ORDER — OXYCODONE HCL 5 MG/5ML PO SOLN: 5.0000 mg | Freq: Once | ORAL | Status: AC | PRN

## 2022-05-25 MED ORDER — OXYCODONE-ACETAMINOPHEN 5-325 MG PO TABS: 1.0000 | ORAL_TABLET | ORAL | 0 refills | Status: DC | PRN

## 2022-05-25 MED ORDER — CEFAZOLIN SODIUM-DEXTROSE 2-4 GM/100ML-% IV SOLN
2.0000 g | INTRAVENOUS | Status: AC
  Administered 2022-05-25: 2 g via INTRAVENOUS
  Filled 2022-05-25: qty 100

## 2022-05-25 MED ORDER — ORAL CARE MOUTH RINSE: 15.0000 mL | Freq: Once | OROMUCOSAL | Status: AC

## 2022-05-25 MED ORDER — MIDAZOLAM HCL 2 MG/2ML IJ SOLN
INTRAMUSCULAR | Status: DC | PRN
  Administered 2022-05-25: 2 mg via INTRAVENOUS

## 2022-05-25 MED ORDER — FENTANYL CITRATE (PF) 250 MCG/5ML IJ SOLN
INTRAMUSCULAR | Status: AC
  Filled 2022-05-25: qty 5

## 2022-05-25 MED ORDER — OXYCODONE HCL 5 MG PO TABS
ORAL_TABLET | ORAL | Status: AC
  Filled 2022-05-25: qty 1

## 2022-05-25 MED ORDER — MIDAZOLAM HCL 2 MG/2ML IJ SOLN
INTRAMUSCULAR | Status: AC
  Filled 2022-05-25: qty 2

## 2022-05-25 MED ORDER — ONDANSETRON HCL 4 MG/2ML IJ SOLN: 4.0000 mg | Freq: Four times a day (QID) | INTRAMUSCULAR | Status: DC | PRN

## 2022-05-25 MED ORDER — OXYCODONE HCL 5 MG PO TABS
5.0000 mg | ORAL_TABLET | Freq: Once | ORAL | Status: AC | PRN
  Administered 2022-05-25: 5 mg via ORAL

## 2022-05-25 MED ORDER — PROPOFOL 10 MG/ML IV BOLUS
INTRAVENOUS | Status: DC | PRN
  Administered 2022-05-25: 250 mg via INTRAVENOUS

## 2022-05-25 SURGICAL SUPPLY — 49 items
BAG COUNTER SPONGE SURGICOUNT (BAG) ×2 IMPLANT
BAG SPNG CNTER NS LX DISP (BAG) ×1
BANDAGE ESMARK 6X9 LF (GAUZE/BANDAGES/DRESSINGS) IMPLANT
BIT DRILL 3.0 6IN (DRILL) IMPLANT
BLADE LONG MED 31X9 (MISCELLANEOUS) IMPLANT
BLADE SAW SGTL HD 18.5X60.5X1. (BLADE) ×2 IMPLANT
BLADE SURG 10 STRL SS (BLADE) IMPLANT
BNDG CMPR 9X6 STRL LF SNTH (GAUZE/BANDAGES/DRESSINGS)
BNDG COHESIVE 4X5 TAN ST LF (GAUZE/BANDAGES/DRESSINGS) IMPLANT
BNDG COHESIVE 4X5 TAN STRL (GAUZE/BANDAGES/DRESSINGS) ×2 IMPLANT
BNDG ESMARK 6X9 LF (GAUZE/BANDAGES/DRESSINGS)
BNDG GAUZE DERMACEA FLUFF 4 (GAUZE/BANDAGES/DRESSINGS) ×4 IMPLANT
BNDG GZE DERMACEA 4 6PLY (GAUZE/BANDAGES/DRESSINGS) ×1
COTTON STERILE ROLL (GAUZE/BANDAGES/DRESSINGS) ×2 IMPLANT
COVER MAYO STAND STRL (DRAPES) IMPLANT
COVER SURGICAL LIGHT HANDLE (MISCELLANEOUS) ×4 IMPLANT
DRAPE INCISE IOBAN 66X45 STRL (DRAPES) ×2 IMPLANT
DRAPE OEC MINIVIEW 54X84 (DRAPES) IMPLANT
DRAPE U-SHAPE 47X51 STRL (DRAPES) ×2 IMPLANT
DRILL 3.0 6IN (DRILL) ×1
DRSG ADAPTIC 3X8 NADH LF (GAUZE/BANDAGES/DRESSINGS) ×2 IMPLANT
DURAPREP 26ML APPLICATOR (WOUND CARE) ×2 IMPLANT
ELECT REM PT RETURN 9FT ADLT (ELECTROSURGICAL) ×1
ELECTRODE REM PT RTRN 9FT ADLT (ELECTROSURGICAL) ×2 IMPLANT
GAUZE PAD ABD 8X10 STRL (GAUZE/BANDAGES/DRESSINGS) IMPLANT
GAUZE SPONGE 4X4 12PLY STRL (GAUZE/BANDAGES/DRESSINGS) ×2 IMPLANT
GLOVE BIOGEL PI IND STRL 9 (GLOVE) ×2 IMPLANT
GLOVE SURG ORTHO 9.0 STRL STRW (GLOVE) ×2 IMPLANT
GOWN STRL REUS W/ TWL XL LVL3 (GOWN DISPOSABLE) ×6 IMPLANT
GOWN STRL REUS W/TWL XL LVL3 (GOWN DISPOSABLE) ×3
GUIDEWIRE 1.6 6IN (WIRE) IMPLANT
KIT BASIN OR (CUSTOM PROCEDURE TRAY) ×2 IMPLANT
KIT TURNOVER KIT B (KITS) ×2 IMPLANT
MANIFOLD NEPTUNE II (INSTRUMENTS) ×2 IMPLANT
NS IRRIG 1000ML POUR BTL (IV SOLUTION) ×2 IMPLANT
PACK ORTHO EXTREMITY (CUSTOM PROCEDURE TRAY) ×2 IMPLANT
PAD ARMBOARD 7.5X6 YLW CONV (MISCELLANEOUS) ×4 IMPLANT
PAD CAST 4YDX4 CTTN HI CHSV (CAST SUPPLIES) ×2 IMPLANT
PADDING CAST COTTON 4X4 STRL (CAST SUPPLIES)
SCREW CANN SHT HDLS 4X32 (Screw) IMPLANT
SCREW CANN SHT HDLS 4X34 (Screw) IMPLANT
SPONGE T-LAP 18X18 ~~LOC~~+RFID (SPONGE) ×2 IMPLANT
SUCTION FRAZIER HANDLE 10FR (MISCELLANEOUS) ×1
SUCTION TUBE FRAZIER 10FR DISP (MISCELLANEOUS) ×2 IMPLANT
SUT ETHILON 2 0 PSLX (SUTURE) ×6 IMPLANT
TOWEL GREEN STERILE (TOWEL DISPOSABLE) ×2 IMPLANT
TOWEL GREEN STERILE FF (TOWEL DISPOSABLE) ×2 IMPLANT
TUBE CONNECTING 12X1/4 (SUCTIONS) ×2 IMPLANT
WATER STERILE IRR 1000ML POUR (IV SOLUTION) ×2 IMPLANT
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
## 2022-05-25 NOTE — Anesthesia Preprocedure Evaluation (Signed)
Anesthesia Evaluation  Patient identified by MRN, date of birth, ID band Patient awake    Reviewed: Allergy & Precautions, H&P , NPO status , Patient's Chart, lab work & pertinent test results  Airway Mallampati: II   Neck ROM: full    Dental   Pulmonary asthma ,    breath sounds clear to auscultation       Cardiovascular negative cardio ROS   Rhythm:regular Rate:Normal     Neuro/Psych  Headaches, PSYCHIATRIC DISORDERS Anxiety Depression Bipolar Disorder    GI/Hepatic GERD  ,(+) Hepatitis -  Endo/Other    Renal/GU      Musculoskeletal   Abdominal   Peds  Hematology  (+) HIV,   Anesthesia Other Findings   Reproductive/Obstetrics                             Anesthesia Physical Anesthesia Plan  ASA: 2  Anesthesia Plan: General   Post-op Pain Management:    Induction: Intravenous  PONV Risk Score and Plan: 3 and Ondansetron, Dexamethasone, Midazolam and Treatment may vary due to age or medical condition  Airway Management Planned: LMA  Additional Equipment:   Intra-op Plan:   Post-operative Plan: Extubation in OR  Informed Consent: I have reviewed the patients History and Physical, chart, labs and discussed the procedure including the risks, benefits and alternatives for the proposed anesthesia with the patient or authorized representative who has indicated his/her understanding and acceptance.     Dental advisory given  Plan Discussed with: CRNA, Anesthesiologist and Surgeon  Anesthesia Plan Comments:         Anesthesia Quick Evaluation

## 2022-05-25 NOTE — Progress Notes (Signed)
Orthopedic Tech Progress Note Patient Details:  TEIGAN MANNER [birth year] [id number]  Ortho Devices Type of Ortho Device: CAM walker, Crutches Ortho Device/Splint Location: RLE Ortho Device/Splint Interventions: Ordered, Application, Adjustment   Post Interventions Patient Tolerated: Well Instructions Provided: Adjustment of device  Alonie Gazzola A Clea Dubach 05/25/2022, 9:57 AM

## 2022-05-25 NOTE — Transfer of Care (Signed)
Immediate Anesthesia Transfer of Care Note  Patient: Danielle [name]  Procedure(s) Performed: RIGHT FOOT FUSION BASE FIRST  AND SECOND METATARSAL (Right: Foot)  Patient Location: PACU  Anesthesia Type:General  Level of Consciousness: awake, alert  and oriented  Airway & Oxygen Therapy: Patient Spontanous Breathing and Patient connected to nasal cannula oxygen  Post-op Assessment: Report given to RN and Patient moving all extremities X 4  Post vital signs: Reviewed and stable  Last Vitals:  Vitals Value Taken Time  BP 148/97 05/25/22 0834  Temp 37.1 C 05/25/22 0834  Pulse 89 05/25/22 0834  Resp 10 05/25/22 0834  SpO2 98 % 05/25/22 0834    Last Pain:  Vitals:   05/25/22 0834  TempSrc: Tympanic  PainSc:       Patients Stated Pain Goal: 0 (39/68/86 4847)  Complications: No notable events documented.

## 2022-05-25 NOTE — Op Note (Signed)
05/25/2022  8:27 AM  PATIENT:  Danielle [name]    PRE-OPERATIVE DIAGNOSIS:  Lisfranc Sprain Right Foot  POST-OPERATIVE DIAGNOSIS:  Same  PROCEDURE:  RIGHT FOOT FUSION BASE FIRST  AND SECOND METATARSAL  SURGEON:  Newt Minion, MD  PHYSICIAN ASSISTANT:None ANESTHESIA:   General  PREOPERATIVE INDICATIONS:  [name] is a  51 y.o. female with a diagnosis of Lisfranc Sprain Right Foot who failed conservative measures and elected for surgical management.    The risks benefits and alternatives were discussed with the patient preoperatively including but not limited to the risks of infection, bleeding, nerve injury, cardiopulmonary complications, the need for revision surgery, among others, and the patient was willing to proceed.  OPERATIVE IMPLANTS: 4.0 headless cannulated screws x2.  '@ENCIMAGES'$ @  OPERATIVE FINDINGS: C-arm fluoroscopy verified reduction of the Lisfranc dislocation.  OPERATIVE PROCEDURE: Patient brought the operating room and underwent LMA anesthetic.  After adequate levels anesthesia were obtained patient's right lower extremity was prepped using DuraPrep draped into a sterile field a timeout was called.  A dorsal incision was made over the first webspace.  This was carried down to the EHL tendon which was retracted laterally.  Subperiosteal dissection was carried down between the interval of the anterior tibial tendon and the EHL tendon.  The capsule around the base of the first metatarsal was completely disrupted.  The joint was further debrided with a periosteal elevator.  Retractors were placed and an oscillating saw was used to remove the articular cartilage from the base of the first metatarsal and the medial cuneiform.  The joint was reduced after debridement and stabilized with a K wire.  The Lisfranc ligament was then reinforced with a K wire.  See arthroscopy verified alignment of the K wires.  2 compression screws were placed to stabilize the base of the first  metatarsal medial cuneiform and to stabilize the Lisfranc complex.  See arthroscopy verified reduction.  The wound was irrigated normal saline incision closed using 2-0 nylon a sterile dressing was applied patient was extubated taken the PACU in stable condition.   DISCHARGE PLANNING:  Antibiotic duration: Preoperative antibiotics  Weightbearing: Nonweightbearing on the right  Pain medication: Prescription for Percocet  Dressing care/ Wound VAC: Follow-up in the office 1 week to change the dressing  Ambulatory devices: Crutches walker or wheelchair  Discharge to: Home.  Follow-up: In the office 1 week post operative.

## 2022-05-25 NOTE — Anesthesia Procedure Notes (Signed)
Procedure Name: LMA Insertion Date/Time: 05/25/2022 7:48 AM  Performed by: Sharee Pimple, CRNAPre-anesthesia Checklist: Patient identified, Emergency Drugs available, Suction available, Patient being monitored and Timeout performed Patient Re-evaluated:Patient Re-evaluated prior to induction Oxygen Delivery Method: Circle system utilized Preoxygenation: Pre-oxygenation with 100% oxygen Induction Type: IV induction LMA: LMA inserted LMA Size: 4.0

## 2022-05-27 NOTE — Anesthesia Postprocedure Evaluation (Signed)
Anesthesia Post Note  Patient: Danielle [name]  Procedure(s) Performed: RIGHT FOOT FUSION BASE FIRST  AND SECOND METATARSAL (Right: Foot)     Patient location during evaluation: PACU Anesthesia Type: General Level of consciousness: awake Pain management: pain level controlled Vital Signs Assessment: post-procedure vital signs reviewed and stable Respiratory status: spontaneous breathing, nonlabored ventilation, respiratory function stable and patient connected to nasal cannula oxygen Cardiovascular status: blood pressure returned to baseline and stable Postop Assessment: no apparent nausea or vomiting Anesthetic complications: no   No notable events documented.  Last Vitals:  Vitals:   05/25/22 0834 05/25/22 0845  BP: (!) 148/97 (!) 160/90  Pulse: 89 88  Resp: 10 16  Temp: 37.1 C   SpO2: 98% 97%    Last Pain:  Vitals:   05/25/22 0855  TempSrc:   PainSc: 5                  Danielle [name]

## 2022-05-28 ENCOUNTER — Other Ambulatory Visit: Payer: Self-pay | Admitting: Infectious Diseases

## 2022-05-28 DIAGNOSIS — B009 Herpesviral infection, unspecified: Secondary | ICD-10-CM

## 2022-05-29 ENCOUNTER — Encounter (HOSPITAL_COMMUNITY): Payer: Self-pay | Admitting: Orthopedic Surgery

## 2022-05-31 ENCOUNTER — Telehealth: Payer: Self-pay | Admitting: Orthopedic Surgery

## 2022-05-31 NOTE — Telephone Encounter (Signed)
Patient wants to know when she should do her follow up appt her surgery was on Sep  15 [phone]

## 2022-06-01 NOTE — Telephone Encounter (Signed)
SW pt, got her scheduled for next Wednesday for post op. She says that she is in a Fracture boot. Asked if she was wearing a wound vac, she didn't know what that was and I explained to her the machine and she said she did not have one.

## 2022-06-06 ENCOUNTER — Ambulatory Visit (INDEPENDENT_AMBULATORY_CARE_PROVIDER_SITE_OTHER): Payer: Medicare HMO | Admitting: Family

## 2022-06-06 DIAGNOSIS — S93324S Dislocation of tarsometatarsal joint of right foot, sequela: Secondary | ICD-10-CM

## 2022-06-13 ENCOUNTER — Ambulatory Visit (INDEPENDENT_AMBULATORY_CARE_PROVIDER_SITE_OTHER): Payer: Medicare HMO | Admitting: Family

## 2022-06-13 ENCOUNTER — Ambulatory Visit: Payer: Self-pay

## 2022-06-13 ENCOUNTER — Encounter: Payer: Self-pay | Admitting: Family

## 2022-06-13 DIAGNOSIS — S93324S Dislocation of tarsometatarsal joint of right foot, sequela: Secondary | ICD-10-CM

## 2022-06-13 MED ORDER — NALOXONE HCL 4 MG/0.1ML NA LIQD: NASAL | 2 refills | Status: AC

## 2022-06-13 NOTE — Progress Notes (Signed)
Post-Op Visit Note   Patient: Danielle [name]           Date of Birth: 1971-08-04           MRN: [id number] Visit Date: 06/13/2022 PCP: Sandria Bales, FNP  Chief Complaint:  Chief Complaint  Patient presents with   Right Foot - Routine Post Op    05/25/2022 right foot fusion base 1st and 2nd MT     HPI:  HPI The patient is a 51 year old woman who presents status post right first and second metatarsal fusion.  Has been nonweightbearing in a cam walker Ortho Exam On examination of the right foot the incision is well approximated sutures there is no gaping no drainage no erythema this is well-healed.  She has dorsiflexion to neutral.  Visit Diagnoses:  1. Lisfranc dislocation, right, sequela     Plan: May begin weightbearing as tolerated in her cam walker.  Follow-up in the office in 2 more weeks with repeat radiographs.  Follow-Up Instructions: Return in about 2 weeks (around 06/27/2022).   Imaging: No results found.  Orders:  Orders Placed This Encounter  Procedures   XR Foot Complete Right   No orders of the defined types were placed in this encounter.    PMFS History: Patient Active Problem List   Diagnosis Date Noted   Lisfranc dislocation, right, sequela    Back pain due to injury 04/24/2022   Candidal vaginitis 01/09/2022   Screening for cervical cancer 01/09/2022   Wrist injury, right, sequela 12/29/2021   Obesity (BMI 30-39.9) 02/02/2020   Hallux rigidus, right foot 03/11/2018   Achilles tendon contracture, right 03/11/2018   Impingement syndrome of right ankle    Sprain of calcaneofibular ligament of right ankle 07/04/2017   Ankle pain, right 06/24/2017   Hyperglycemia 02/18/2017   Hepatitis B immune 02/18/2017   Fibroadenoma of breast 02/18/2017   S/P total abdominal hysterectomy 06/28/2015   Condyloma acuminatum 02/23/2015   Urinary incontinence 01/05/2015   Abnormal finding on GI tract imaging 10/12/2013   Anemia-chronic 10/09/2013    Achalasia s/p laparoscopic Heller myotomy and Dor fundoplication 29/24/4628   Ganglion cyst of finger of right hand 07/06/2013   GERD (gastroesophageal reflux disease) 03/16/2013   Depression 02/10/2009   Human immunodeficiency virus (HIV) disease ([place]) 03/24/2007   Anxiety state 03/24/2007   ASTHMA 03/24/2007   Past Medical History:  Diagnosis Date   Abnormal uterine bleeding (AUB)    Achalasia    Type 2   Anemia    Anxiety    Asthma    Bipolar disorder (HCC)    Mixed   Bulging lumbar disc    Chronic back pain    Condyloma acuminata    Depression    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   Ganglion cyst    Right hand   Genital HSV    GERD (gastroesophageal reflux disease)    Hepatitis    type B treated   HIV infection ([place])    Migraines    Pneumonia    Pre-diabetes    Trichimoniasis    Urinary incontinence    Uterine fibroid    Wears glasses     Family History  Problem Relation Age of Onset   Hyperlipidemia Mother    Diabetes Mother    Stroke Mother    Diabetes Brother    Colon cancer Father    Esophageal cancer Neg Hx    Rectal cancer Neg Hx    Stomach cancer Neg Hx  Past Surgical History:  Procedure Laterality Date   ABDOMINAL HYSTERECTOMY N/A 06/28/2015   Procedure: HYSTERECTOMY ABDOMINAL;  Surgeon: Osborne Oman, MD;  Location: [facility];  Service: Gynecology;  Laterality: N/A;  Requested 06/28/15 @ 7:30a   ANKLE ARTHROSCOPY Right 10/16/2017   Procedure: RIGHT ANKLE ARTHROSCOPIC DEBRIDEMENT;  Surgeon: Newt Minion, MD;  Location: [place];  Service: Orthopedics;  Laterality: Right;   ARTHRODESIS METATARSALPHALANGEAL JOINT (MTPJ) Right 07/23/2018   Procedure: RIGHT GREAT TOE METATARSOPHALANGEAL JOINT FUSION;  Surgeon: Newt Minion, MD;  Location: [place];  Service: Orthopedics;  Laterality: Right;   BILATERAL SALPINGECTOMY Bilateral 06/28/2015   Procedure: BILATERAL SALPINGECTOMY;  Surgeon: Osborne Oman, MD;  Location: [facility];  Service: Gynecology;   Laterality: Bilateral;   CESAREAN SECTION     twice;  vertical incision x 2   COLONOSCOPY N/A 10/12/2013   Procedure: COLONOSCOPY;  Surgeon: Jerene Bears, MD;  [facility];  Service: Endoscopy;  Laterality: N/A;   CYSTO WITH HYDRODISTENSION N/A 05/27/2018   Procedure: CYSTOSCOPY/HYDRODISTENSION AND INSTILLATION;  Surgeon: Bjorn Loser, MD;  Location[facility];  Service: Urology;  Laterality: N/A;   ESOPHAGEAL MANOMETRY N/A 06/29/2013   Procedure: ESOPHAGEAL MANOMETRY (EM);  Surgeon: Jerene Bears, MD;  [facility];  Service: Gastroenterology;  Laterality: N/A;   FOOT ARTHRODESIS Right 05/25/2022   Procedure: RIGHT FOOT FUSION BASE FIRST  AND SECOND METATARSAL;  Surgeon: Newt Minion, MD;  Location: [place];  Service: Orthopedics;  Laterality: Right;   FOREIGN BODY REMOVAL Left 2018   fiberglass, left thumb   HELLER MYOTOMY N/A 10/08/2013   Procedure: LAPAROSCOPIC HELLER MYOTOMY, DOR FUNDIPLICATION, UPPER ENDOSCOPY;  Surgeon: Odis Hollingshead, MD;  [facility];  Service: General;  Laterality: N/A;   HERNIA REPAIR     Umbilical   HYSTEROSCOPY WITH RESECTOSCOPE N/A 05/03/2015   Procedure: Diagnostic Hysteroscopy;  Surgeon: Lavonia Drafts, MD;  Location: [facility];  Service: Gynecology;  Laterality: N/A;  wants Novasure and myosure.   LYSIS OF ADHESION  06/28/2015   Procedure: LYSIS OF ADHESION;  Surgeon: Osborne Oman, MD;  Location: [facility];  Service: Gynecology;;   OTHER SURGICAL HISTORY     biopsy axillary ,right arm   right arm biopsy     UPPER GI ENDOSCOPY N/A 10/08/2013   Procedure: UPPER GI ENDOSCOPY;  Surgeon: Odis Hollingshead, MD;  [facility];  Service: General;  Laterality: N/A;   WISDOM TOOTH EXTRACTION     WRIST ARTHROSCOPY WITH DEBRIDEMENT Right 06/06/2021   Procedure: ARTHROSCOPY WRIST, DEBRIDEMENT TFCC, SYNOVECTOMY AND  EXTENSO CARPI ULNARIS  REPAIR;  Surgeon: Renette Butters, MD;  Location: [place];   Service: Orthopedics;  Laterality: Right;   Social History   Occupational History   Occupation: House wife  Tobacco Use   Smoking status: Never   Smokeless tobacco: Never  Vaping Use   Vaping Use: Never used  Substance and Sexual Activity   Alcohol use: No   Drug use: No   Sexual activity: Not Currently    Partners: Male    Birth control/protection: Surgical

## 2022-07-03 ENCOUNTER — Encounter: Payer: Self-pay | Admitting: Family

## 2022-07-03 ENCOUNTER — Ambulatory Visit (INDEPENDENT_AMBULATORY_CARE_PROVIDER_SITE_OTHER): Payer: Medicare HMO | Admitting: Family

## 2022-07-03 ENCOUNTER — Ambulatory Visit: Payer: Medicare HMO

## 2022-07-03 DIAGNOSIS — S93324S Dislocation of tarsometatarsal joint of right foot, sequela: Secondary | ICD-10-CM

## 2022-07-03 NOTE — Progress Notes (Signed)
Post-Op Visit Note   Patient: Danielle [name]           Date of Birth: December 03, 1970           MRN: [id number] Visit Date: 07/03/2022 PCP: Sandria Bales, FNP  Chief Complaint: No chief complaint on file.   HPI:  HPI The patient is a 51 year old woman who is seen status post Lisfranc repair about 6 weeks ago she has been full weightbearing in a cam walker without any issues.  No concerns. Ortho Exam On examination of the right foot her incision is well-healed there is minimal edema no erythema no warmth  Visit Diagnoses:  1. Lisfranc dislocation, right, sequela     Plan: Advance weightbearing in regular shoewear discussed using stiff shoe wear.  She will follow-up in 4 weeks if she fails to improve as expected.  Follow-Up Instructions: No follow-ups on file.   Imaging: No results found.  Orders:  Orders Placed This Encounter  Procedures   XR Foot Complete Right   No orders of the defined types were placed in this encounter.    PMFS History: Patient Active Problem List   Diagnosis Date Noted   Lisfranc dislocation, right, sequela    Back pain due to injury 04/24/2022   Candidal vaginitis 01/09/2022   Screening for cervical cancer 01/09/2022   Wrist injury, right, sequela 12/29/2021   Obesity (BMI 30-39.9) 02/02/2020   Hallux rigidus, right foot 03/11/2018   Achilles tendon contracture, right 03/11/2018   Impingement syndrome of right ankle    Sprain of calcaneofibular ligament of right ankle 07/04/2017   Ankle pain, right 06/24/2017   Hyperglycemia 02/18/2017   Hepatitis B immune 02/18/2017   Fibroadenoma of breast 02/18/2017   S/P total abdominal hysterectomy 06/28/2015   Condyloma acuminatum 02/23/2015   Urinary incontinence 01/05/2015   Abnormal finding on GI tract imaging 10/12/2013   Anemia-chronic 10/09/2013   Achalasia s/p laparoscopic Heller myotomy and Dor fundoplication 26/83/4196   Ganglion cyst of finger of right hand 07/06/2013   GERD  (gastroesophageal reflux disease) 03/16/2013   Depression 02/10/2009   Human immunodeficiency virus (HIV) disease ([place]) 03/24/2007   Anxiety state 03/24/2007   ASTHMA 03/24/2007   Past Medical History:  Diagnosis Date   Abnormal uterine bleeding (AUB)    Achalasia    Type 2   Anemia    Anxiety    Asthma    Bipolar disorder (HCC)    Mixed   Bulging lumbar disc    Chronic back pain    Condyloma acuminata    Depression    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   Ganglion cyst    Right hand   Genital HSV    GERD (gastroesophageal reflux disease)    Hepatitis    type B treated   HIV infection ([place])    Migraines    Pneumonia    Pre-diabetes    Trichimoniasis    Urinary incontinence    Uterine fibroid    Wears glasses     Family History  Problem Relation Age of Onset   Hyperlipidemia Mother    Diabetes Mother    Stroke Mother    Diabetes Brother    Colon cancer Father    Esophageal cancer Neg Hx    Rectal cancer Neg Hx    Stomach cancer Neg Hx     Past Surgical History:  Procedure Laterality Date   ABDOMINAL HYSTERECTOMY N/A 06/28/2015   Procedure: HYSTERECTOMY ABDOMINAL;  Surgeon: Osborne Oman,  MD;  Location: [facility];  Service: Gynecology;  Laterality: N/A;  Requested 06/28/15 @ 7:30a   ANKLE ARTHROSCOPY Right 10/16/2017   Procedure: RIGHT ANKLE ARTHROSCOPIC DEBRIDEMENT;  Surgeon: Newt Minion, MD;  Location: [place];  Service: Orthopedics;  Laterality: Right;   ARTHRODESIS METATARSALPHALANGEAL JOINT (MTPJ) Right 07/23/2018   Procedure: RIGHT GREAT TOE METATARSOPHALANGEAL JOINT FUSION;  Surgeon: Newt Minion, MD;  Location: [place];  Service: Orthopedics;  Laterality: Right;   BILATERAL SALPINGECTOMY Bilateral 06/28/2015   Procedure: BILATERAL SALPINGECTOMY;  Surgeon: Osborne Oman, MD;  Location: [facility];  Service: Gynecology;  Laterality: Bilateral;   CESAREAN SECTION     twice;  vertical incision x 2   COLONOSCOPY N/A 10/12/2013   Procedure: COLONOSCOPY;   Surgeon: Jerene Bears, MD;  [facility];  Service: Endoscopy;  Laterality: N/A;   CYSTO WITH HYDRODISTENSION N/A 05/27/2018   Procedure: CYSTOSCOPY/HYDRODISTENSION AND INSTILLATION;  Surgeon: Bjorn Loser, MD;  [facility];  Service: Urology;  Laterality: N/A;   ESOPHAGEAL MANOMETRY N/A 06/29/2013   Procedure: ESOPHAGEAL MANOMETRY (EM);  Surgeon: Jerene Bears, MD;  [facility];  Service: Gastroenterology;  Laterality: N/A;   FOOT ARTHRODESIS Right 05/25/2022   Procedure: RIGHT FOOT FUSION BASE FIRST  AND SECOND METATARSAL;  Surgeon: Newt Minion, MD;  Location: [place];  Service: Orthopedics;  Laterality: Right;   FOREIGN BODY REMOVAL Left 2018   fiberglass, left thumb   HELLER MYOTOMY N/A 10/08/2013   Procedure: LAPAROSCOPIC HELLER MYOTOMY, DOR FUNDIPLICATION, UPPER ENDOSCOPY;  Surgeon: Odis Hollingshead, MD;  [facility];  Service: General;  Laterality: N/A;   HERNIA REPAIR     Umbilical   HYSTEROSCOPY WITH RESECTOSCOPE N/A 05/03/2015   Procedure: Diagnostic Hysteroscopy;  Surgeon: Lavonia Drafts, MD;  Location: [facility];  Service: Gynecology;  Laterality: N/A;  wants Novasure and myosure.   LYSIS OF ADHESION  06/28/2015   Procedure: LYSIS OF ADHESION;  Surgeon: Osborne Oman, MD;  Location: [facility];  Service: Gynecology;;   OTHER SURGICAL HISTORY     biopsy axillary ,right arm   right arm biopsy     UPPER GI ENDOSCOPY N/A 10/08/2013   Procedure: UPPER GI ENDOSCOPY;  Surgeon: Odis Hollingshead, MD;  [facility];  Service: General;  Laterality: N/A;   WISDOM TOOTH EXTRACTION     WRIST ARTHROSCOPY WITH DEBRIDEMENT Right 06/06/2021   Procedure: ARTHROSCOPY WRIST, DEBRIDEMENT TFCC, SYNOVECTOMY AND  EXTENSO CARPI ULNARIS  REPAIR;  Surgeon: Renette Butters, MD;  Location: [place];  Service: Orthopedics;  Laterality: Right;   Social History   Occupational History   Occupation: House wife  Tobacco Use    Smoking status: Never   Smokeless tobacco: Never  Vaping Use   Vaping Use: Never used  Substance and Sexual Activity   Alcohol use: No   Drug use: No   Sexual activity: Not Currently    Partners: Male    Birth control/protection: Surgical

## 2022-07-25 ENCOUNTER — Encounter: Payer: Self-pay | Admitting: Family

## 2022-07-25 NOTE — Progress Notes (Signed)
Post-Op Visit Note   Patient: Danielle [name]           Date of Birth: May 25, 1971           MRN: [id number] Visit Date: 06/06/2022 PCP: Sandria Bales, FNP  Chief Complaint:  Chief Complaint  Patient presents with   Right Foot - Routine Post Op    05/25/2022 right foot fusion base 1st and 2nd MT    HPI:  HPI The patient is a 51 year old woman seen status post right foot fusion base of the first and second metatarsals on September 15 Ortho Exam On examination of the right foot incision well approximated sutures there is no gaping drainage no erythema minimal edema.  Visit Diagnoses: No diagnosis found.  Plan: Begin daily Dial soap cleansing.  Dry dressings.  Nonweightbearing.  She will follow-up in 2 weeks with radiographs.  Anticipate suture removal.  Follow-Up Instructions: No follow-ups on file.   Imaging: No results found.  Orders:  No orders of the defined types were placed in this encounter.  No orders of the defined types were placed in this encounter.    PMFS History: Patient Active Problem List   Diagnosis Date Noted   Lisfranc dislocation, right, sequela    Back pain due to injury 04/24/2022   Candidal vaginitis 01/09/2022   Screening for cervical cancer 01/09/2022   Wrist injury, right, sequela 12/29/2021   Obesity (BMI 30-39.9) 02/02/2020   Hallux rigidus, right foot 03/11/2018   Achilles tendon contracture, right 03/11/2018   Impingement syndrome of right ankle    Sprain of calcaneofibular ligament of right ankle 07/04/2017   Ankle pain, right 06/24/2017   Hyperglycemia 02/18/2017   Hepatitis B immune 02/18/2017   Fibroadenoma of breast 02/18/2017   S/P total abdominal hysterectomy 06/28/2015   Condyloma acuminatum 02/23/2015   Urinary incontinence 01/05/2015   Abnormal finding on GI tract imaging 10/12/2013   Anemia-chronic 10/09/2013   Achalasia s/p laparoscopic Heller myotomy and Dor fundoplication 73/53/2992   Ganglion cyst of  finger of right hand 07/06/2013   GERD (gastroesophageal reflux disease) 03/16/2013   Depression 02/10/2009   Human immunodeficiency virus (HIV) disease ([place]) 03/24/2007   Anxiety state 03/24/2007   ASTHMA 03/24/2007   Past Medical History:  Diagnosis Date   Abnormal uterine bleeding (AUB)    Achalasia    Type 2   Anemia    Anxiety    Asthma    Bipolar disorder (HCC)    Mixed   Bulging lumbar disc    Chronic back pain    Condyloma acuminata    Depression    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   Ganglion cyst    Right hand   Genital HSV    GERD (gastroesophageal reflux disease)    Hepatitis    type B treated   HIV infection ([place])    Migraines    Pneumonia    Pre-diabetes    Trichimoniasis    Urinary incontinence    Uterine fibroid    Wears glasses     Family History  Problem Relation Age of Onset   Hyperlipidemia Mother    Diabetes Mother    Stroke Mother    Diabetes Brother    Colon cancer Father    Esophageal cancer Neg Hx    Rectal cancer Neg Hx    Stomach cancer Neg Hx     Past Surgical History:  Procedure Laterality Date   ABDOMINAL HYSTERECTOMY N/A 06/28/2015   Procedure: HYSTERECTOMY ABDOMINAL;  Surgeon: Osborne Oman, MD;  Location: [facility];  Service: Gynecology;  Laterality: N/A;  Requested 06/28/15 @ 7:30a   ANKLE ARTHROSCOPY Right 10/16/2017   Procedure: RIGHT ANKLE ARTHROSCOPIC DEBRIDEMENT;  Surgeon: Newt Minion, MD;  Location: [place];  Service: Orthopedics;  Laterality: Right;   ARTHRODESIS METATARSALPHALANGEAL JOINT (MTPJ) Right 07/23/2018   Procedure: RIGHT GREAT TOE METATARSOPHALANGEAL JOINT FUSION;  Surgeon: Newt Minion, MD;  Location: [place];  Service: Orthopedics;  Laterality: Right;   BILATERAL SALPINGECTOMY Bilateral 06/28/2015   Procedure: BILATERAL SALPINGECTOMY;  Surgeon: Osborne Oman, MD;  Location: [facility];  Service: Gynecology;  Laterality: Bilateral;   CESAREAN SECTION     twice;  vertical incision x 2   COLONOSCOPY N/A  10/12/2013   Procedure: COLONOSCOPY;  Surgeon: Jerene Bears, MD;  [facility];  Service: Endoscopy;  Laterality: N/A;   CYSTO WITH HYDRODISTENSION N/A 05/27/2018   Procedure: CYSTOSCOPY/HYDRODISTENSION AND INSTILLATION;  Surgeon: Bjorn Loser, MD;  Location: [facility];  Service: Urology;  Laterality: N/A;   ESOPHAGEAL MANOMETRY N/A 06/29/2013   Procedure: ESOPHAGEAL MANOMETRY (EM);  Surgeon: Jerene Bears, MD;  [facility];  Service: Gastroenterology;  Laterality: N/A;   FOOT ARTHRODESIS Right 05/25/2022   Procedure: RIGHT FOOT FUSION BASE FIRST  AND SECOND METATARSAL;  Surgeon: Newt Minion, MD;  Location: [place];  Service: Orthopedics;  Laterality: Right;   FOREIGN BODY REMOVAL Left 2018   fiberglass, left thumb   HELLER MYOTOMY N/A 10/08/2013   Procedure: LAPAROSCOPIC HELLER MYOTOMY, DOR FUNDIPLICATION, UPPER ENDOSCOPY;  Surgeon: Odis Hollingshead, MD;  [facility];  Service: General;  Laterality: N/A;   HERNIA REPAIR     Umbilical   HYSTEROSCOPY WITH RESECTOSCOPE N/A 05/03/2015   Procedure: Diagnostic Hysteroscopy;  Surgeon: Lavonia Drafts, MD;  Location: [facility];  Service: Gynecology;  Laterality: N/A;  wants Novasure and myosure.   LYSIS OF ADHESION  06/28/2015   Procedure: LYSIS OF ADHESION;  Surgeon: Osborne Oman, MD;  Location: [facility];  Service: Gynecology;;   OTHER SURGICAL HISTORY     biopsy axillary ,right arm   right arm biopsy     UPPER GI ENDOSCOPY N/A 10/08/2013   Procedure: UPPER GI ENDOSCOPY;  Surgeon: Odis Hollingshead, MD;  [facility];  Service: General;  Laterality: N/A;   WISDOM TOOTH EXTRACTION     WRIST ARTHROSCOPY WITH DEBRIDEMENT Right 06/06/2021   Procedure: ARTHROSCOPY WRIST, DEBRIDEMENT TFCC, SYNOVECTOMY AND  EXTENSO CARPI ULNARIS  REPAIR;  Surgeon: Renette Butters, MD;  Location: [place];  Service: Orthopedics;  Laterality: Right;   Social History   Occupational History    Occupation: House wife  Tobacco Use   Smoking status: Never   Smokeless tobacco: Never  Vaping Use   Vaping Use: Never used  Substance and Sexual Activity   Alcohol use: No   Drug use: No   Sexual activity: Not Currently    Partners: Male    Birth control/protection: Surgical

## 2022-08-01 ENCOUNTER — Encounter: Payer: Self-pay | Admitting: Family

## 2022-08-01 ENCOUNTER — Ambulatory Visit (INDEPENDENT_AMBULATORY_CARE_PROVIDER_SITE_OTHER): Payer: Medicare HMO

## 2022-08-01 ENCOUNTER — Telehealth: Payer: Self-pay | Admitting: Family

## 2022-08-01 ENCOUNTER — Other Ambulatory Visit: Payer: Self-pay | Admitting: Orthopedic Surgery

## 2022-08-01 ENCOUNTER — Ambulatory Visit (INDEPENDENT_AMBULATORY_CARE_PROVIDER_SITE_OTHER): Payer: Medicare HMO | Admitting: Family

## 2022-08-01 DIAGNOSIS — S93324D Dislocation of tarsometatarsal joint of right foot, subsequent encounter: Secondary | ICD-10-CM

## 2022-08-01 DIAGNOSIS — S93324S Dislocation of tarsometatarsal joint of right foot, sequela: Secondary | ICD-10-CM

## 2022-08-01 MED ORDER — OXYCODONE-ACETAMINOPHEN 5-325 MG PO TABS: 1.0000 | ORAL_TABLET | ORAL | 0 refills | Status: DC | PRN

## 2022-08-01 NOTE — Progress Notes (Signed)
Post-Op Visit Note   Patient: Danielle [name]           Date of Birth: 09-24-1970           MRN: [id number] Visit Date: 08/01/2022 PCP: Sandria Bales, FNP  Chief Complaint: No chief complaint on file.   HPI:  HPI The patient is a 51 year old woman who presents today status post right Lisfranc dislocation repair.  She has been in stiff walking shoes full weightbearing since last visit today she comes in limping complaining of pain she states she cannot stand for more than 5 minutes without having to take a break. Ortho Exam On examination of the right foot her incision is well-healed there is no significant edema or erythema or warmth.  Mild tenderness over the dorsum of the foot no hypersensitivity to light touch.  No dystrophic changes.  Visit Diagnoses: No diagnosis found.  Plan: Recommended she get back into her cam walker for another 4 weeks she may weight-bear as tolerated in the cam walker follow-up in the office in 4 weeks  Follow-Up Instructions: No follow-ups on file.   Imaging: No results found.  Orders:  No orders of the defined types were placed in this encounter.  No orders of the defined types were placed in this encounter.    PMFS History: Patient Active Problem List   Diagnosis Date Noted   Lisfranc dislocation, right, sequela    Back pain due to injury 04/24/2022   Candidal vaginitis 01/09/2022   Screening for cervical cancer 01/09/2022   Wrist injury, right, sequela 12/29/2021   Obesity (BMI 30-39.9) 02/02/2020   Hallux rigidus, right foot 03/11/2018   Achilles tendon contracture, right 03/11/2018   Impingement syndrome of right ankle    Sprain of calcaneofibular ligament of right ankle 07/04/2017   Ankle pain, right 06/24/2017   Hyperglycemia 02/18/2017   Hepatitis B immune 02/18/2017   Fibroadenoma of breast 02/18/2017   S/P total abdominal hysterectomy 06/28/2015   Condyloma acuminatum 02/23/2015   Urinary incontinence 01/05/2015    Abnormal finding on GI tract imaging 10/12/2013   Anemia-chronic 10/09/2013   Achalasia s/p laparoscopic Heller myotomy and Dor fundoplication 33/35/4562   Ganglion cyst of finger of right hand 07/06/2013   GERD (gastroesophageal reflux disease) 03/16/2013   Depression 02/10/2009   Human immunodeficiency virus (HIV) disease ([place]) 03/24/2007   Anxiety state 03/24/2007   ASTHMA 03/24/2007   Past Medical History:  Diagnosis Date   Abnormal uterine bleeding (AUB)    Achalasia    Type 2   Anemia    Anxiety    Asthma    Bipolar disorder (HCC)    Mixed   Bulging lumbar disc    Chronic back pain    Condyloma acuminata    Depression    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   Ganglion cyst    Right hand   Genital HSV    GERD (gastroesophageal reflux disease)    Hepatitis    type B treated   HIV infection ([place])    Migraines    Pneumonia    Pre-diabetes    Trichimoniasis    Urinary incontinence    Uterine fibroid    Wears glasses     Family History  Problem Relation Age of Onset   Hyperlipidemia Mother    Diabetes Mother    Stroke Mother    Diabetes Brother    Colon cancer Father    Esophageal cancer Neg Hx    Rectal cancer Neg  Hx    Stomach cancer Neg Hx     Past Surgical History:  Procedure Laterality Date   ABDOMINAL HYSTERECTOMY N/A 06/28/2015   Procedure: HYSTERECTOMY ABDOMINAL;  Surgeon: Osborne Oman, MD;  Location: [facility];  Service: Gynecology;  Laterality: N/A;  Requested 06/28/15 @ 7:30a   ANKLE ARTHROSCOPY Right 10/16/2017   Procedure: RIGHT ANKLE ARTHROSCOPIC DEBRIDEMENT;  Surgeon: Newt Minion, MD;  Location: [facility];  Service: Orthopedics;  Laterality: Right;   ARTHRODESIS METATARSALPHALANGEAL JOINT (MTPJ) Right 07/23/2018   Procedure: RIGHT GREAT TOE METATARSOPHALANGEAL JOINT FUSION;  Surgeon: Newt Minion, MD;  Location: [facility];  Service: Orthopedics;  Laterality: Right;   BILATERAL SALPINGECTOMY Bilateral 06/28/2015   Procedure: BILATERAL  SALPINGECTOMY;  Surgeon: Osborne Oman, MD;  Location: [facility];  Service: Gynecology;  Laterality: Bilateral;   CESAREAN SECTION     twice;  vertical incision x 2   COLONOSCOPY N/A 10/12/2013   Procedure: COLONOSCOPY;  Surgeon: Jerene Bears, MD;  [facility];  Service: Endoscopy;  Laterality: N/A;   CYSTO WITH HYDRODISTENSION N/A 05/27/2018   Procedure: CYSTOSCOPY/HYDRODISTENSION AND INSTILLATION;  Surgeon: Bjorn Loser, MD;  Location: [facility];  Service: Urology;  Laterality: N/A;   ESOPHAGEAL MANOMETRY N/A 06/29/2013   Procedure: ESOPHAGEAL MANOMETRY (EM);  Surgeon: Jerene Bears, MD;  [facility];  Service: Gastroenterology;  Laterality: N/A;   FOOT ARTHRODESIS Right 05/25/2022   Procedure: RIGHT FOOT FUSION BASE FIRST  AND SECOND METATARSAL;  Surgeon: Newt Minion, MD;  Location: [facility];  Service: Orthopedics;  Laterality: Right;   FOREIGN BODY REMOVAL Left 2018   fiberglass, left thumb   HELLER MYOTOMY N/A 10/08/2013   Procedure: LAPAROSCOPIC HELLER MYOTOMY, DOR FUNDIPLICATION, UPPER ENDOSCOPY;  Surgeon: Odis Hollingshead, MD;  Location[facility];  Service: General;  Laterality: N/A;   HERNIA REPAIR     Umbilical   HYSTEROSCOPY WITH RESECTOSCOPE N/A 05/03/2015   Procedure: Diagnostic Hysteroscopy;  Surgeon: Lavonia Drafts, MD;  Location: [facility];  Service: Gynecology;  Laterality: N/A;  wants Novasure and myosure.   LYSIS OF ADHESION  06/28/2015   Procedure: LYSIS OF ADHESION;  Surgeon: Osborne Oman, MD;  Location: [facility];  Service: Gynecology;;   OTHER SURGICAL HISTORY     biopsy axillary ,right arm   right arm biopsy     UPPER GI ENDOSCOPY N/A 10/08/2013   Procedure: UPPER GI ENDOSCOPY;  Surgeon: Odis Hollingshead, MD;  [facility];  Service: General;  Laterality: N/A;   WISDOM TOOTH EXTRACTION     WRIST ARTHROSCOPY WITH DEBRIDEMENT Right 06/06/2021   Procedure: ARTHROSCOPY WRIST, DEBRIDEMENT TFCC, SYNOVECTOMY AND  EXTENSO CARPI  ULNARIS  REPAIR;  Surgeon: Renette Butters, MD;  Location: [facility];  Service: Orthopedics;  Laterality: Right;   Social History   Occupational History   Occupation: House wife  Tobacco Use   Smoking status: Never   Smokeless tobacco: Never  Vaping Use   Vaping Use: Never used  Substance and Sexual Activity   Alcohol use: No   Drug use: No   Sexual activity: Not Currently    Partners: Male    Birth control/protection: Surgical

## 2022-08-01 NOTE — Telephone Encounter (Signed)
Pt called requesting pain medication. Please send to pharmacy on file. Pt phone number is [phone]

## 2022-08-01 NOTE — Telephone Encounter (Signed)
Pt s/p right foot fusion s/p Lisfranc fx 05/2022. Was in the office today and did not ask for refill of Oxycodone 5/325 last filled 05/25/22 #30 please advise.

## 2022-08-01 NOTE — Telephone Encounter (Signed)
Called and lm on vm to advise pt rx has been sent to pharm.

## 2022-08-31 DIAGNOSIS — E782 Mixed hyperlipidemia: Secondary | ICD-10-CM | POA: Diagnosis not present

## 2022-08-31 DIAGNOSIS — S29001A Unspecified injury of muscle and tendon of front wall of thorax, initial encounter: Secondary | ICD-10-CM | POA: Diagnosis not present

## 2022-08-31 DIAGNOSIS — R7303 Prediabetes: Secondary | ICD-10-CM | POA: Diagnosis not present

## 2022-08-31 DIAGNOSIS — Z6833 Body mass index (BMI) 33.0-33.9, adult: Secondary | ICD-10-CM | POA: Diagnosis not present

## 2022-09-05 ENCOUNTER — Ambulatory Visit: Payer: Medicare HMO | Admitting: Family

## 2022-09-08 DIAGNOSIS — G43009 Migraine without aura, not intractable, without status migrainosus: Secondary | ICD-10-CM | POA: Diagnosis not present

## 2022-09-08 DIAGNOSIS — E7849 Other hyperlipidemia: Secondary | ICD-10-CM | POA: Diagnosis not present

## 2022-09-08 DIAGNOSIS — I10 Essential (primary) hypertension: Secondary | ICD-10-CM | POA: Diagnosis not present

## 2022-09-11 ENCOUNTER — Ambulatory Visit (INDEPENDENT_AMBULATORY_CARE_PROVIDER_SITE_OTHER): Payer: Medicare HMO | Admitting: Orthopedic Surgery

## 2022-09-11 DIAGNOSIS — S93324S Dislocation of tarsometatarsal joint of right foot, sequela: Secondary | ICD-10-CM

## 2022-09-12 ENCOUNTER — Encounter: Payer: Self-pay | Admitting: Orthopedic Surgery

## 2022-09-12 NOTE — Progress Notes (Signed)
Office Visit Note   Patient: Danielle [name]           Date of Birth: 11-17-1970           MRN: [id number] Visit Date: 09/11/2022              Requested by: Sandria Bales, FNP [address],  [place] [address] PCP: Sandria Bales, FNP  Chief Complaint  Patient presents with   Right Foot - Follow-up    05/25/22 right foot fusion base of 1st and 2nd MT      HPI: Patient is a 52 year old woman who is status post midfoot fusion and hallux rigidus fusion.  She is currently in crocs weightbearing as tolerated.  Patient is 3-1/2 months out from surgery.  Assessment & Plan: Visit Diagnoses:  1. Lisfranc dislocation, right, sequela     Plan: We will provide her a postoperative shoe to provide her foot more support.  Follow-Up Instructions: Return if symptoms worsen or fail to improve.   Ortho Exam  Patient is alert, oriented, no adenopathy, well-dressed, normal affect, normal respiratory effort. Examination patient is a good dorsiflexion of the ankle.  She has good pulses the incisions are well-healed no keloiding there is no redness or swelling.  Her foot is plantigrade.  Imaging: No results found. No images are attached to the encounter.  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 04/27/2015     Lab Results  Component Value Date   ALBUMIN 3.9 05/25/2022   ALBUMIN 3.6 07/23/2018   ALBUMIN 4.1 02/19/2018    No results found for: "MG" No results found for: "VD25OH"  No results found for: "PREALBUMIN"    Latest Ref Rng & Units 05/25/2022    6:16 AM 12/29/2021    9:43 AM 09/27/2020   10:41 AM  CBC EXTENDED  WBC 4.0 - 10.5 K/uL 5.1  4.3  5.1   RBC 3.87 - 5.11 MIL/uL 4.07  4.42  4.32   Hemoglobin 12.0 - 15.0 g/dL 12.4  13.1  12.8   HCT  36.0 - 46.0 % 37.7  40.6  39.0   Platelets 150 - 400 K/uL 187  212  244      There is no height or weight on file to calculate BMI.  Orders:  No orders of the defined types were placed in this encounter.  No orders of the defined types were placed in this encounter.    Procedures: No procedures performed  Clinical Data: No additional findings.  ROS:  All other systems negative, except as noted in the HPI. Review of Systems  Objective: Vital Signs: LMP 04/11/2015 (Approximate) Comment: unknown  Specialty Comments:  No specialty comments available.  PMFS History: Patient Active Problem List   Diagnosis Date Noted   Lisfranc dislocation, right, sequela    Back pain due to injury 04/24/2022   Candidal vaginitis 01/09/2022   Screening for cervical cancer 01/09/2022   Wrist injury, right, sequela 12/29/2021   Obesity (BMI 30-39.9) 02/02/2020   Hallux rigidus, right foot 03/11/2018   Achilles tendon contracture, right 03/11/2018   Impingement syndrome of right ankle    Sprain of calcaneofibular ligament of right ankle 07/04/2017   Ankle pain, right 06/24/2017   Hyperglycemia 02/18/2017   Hepatitis B  immune 02/18/2017   Fibroadenoma of breast 02/18/2017   S/P total abdominal hysterectomy 06/28/2015   Condyloma acuminatum 02/23/2015   Urinary incontinence 01/05/2015   Abnormal finding on GI tract imaging 10/12/2013   Anemia-chronic 10/09/2013   Achalasia s/p laparoscopic Heller myotomy and Dor fundoplication 97/67/3419   Ganglion cyst of finger of right hand 07/06/2013   GERD (gastroesophageal reflux disease) 03/16/2013   Depression 02/10/2009   Human immunodeficiency virus (HIV) disease ([place]) 03/24/2007   Anxiety state 03/24/2007   ASTHMA 03/24/2007   Past Medical History:  Diagnosis Date   Abnormal uterine bleeding (AUB)    Achalasia    Type 2   Anemia    Anxiety    Asthma    Bipolar disorder (HCC)    Mixed   Bulging lumbar disc    Chronic back pain     Condyloma acuminata    Depression    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   Ganglion cyst    Right hand   Genital HSV    GERD (gastroesophageal reflux disease)    Hepatitis    type B treated   HIV infection ([place])    Migraines    Pneumonia    Pre-diabetes    Trichimoniasis    Urinary incontinence    Uterine fibroid    Wears glasses     Family History  Problem Relation Age of Onset   Hyperlipidemia Mother    Diabetes Mother    Stroke Mother    Diabetes Brother    Colon cancer Father    Esophageal cancer Neg Hx    Rectal cancer Neg Hx    Stomach cancer Neg Hx     Past Surgical History:  Procedure Laterality Date   ABDOMINAL HYSTERECTOMY N/A 06/28/2015   Procedure: HYSTERECTOMY ABDOMINAL;  Surgeon: Osborne Oman, MD;  Location: [facility];  Service: Gynecology;  Laterality: N/A;  Requested 06/28/15 @ 7:30a   ANKLE ARTHROSCOPY Right 10/16/2017   Procedure: RIGHT ANKLE ARTHROSCOPIC DEBRIDEMENT;  Surgeon: Newt Minion, MD;  Location: [facility];  Service: Orthopedics;  Laterality: Right;   ARTHRODESIS METATARSALPHALANGEAL JOINT (MTPJ) Right 07/23/2018   Procedure: RIGHT GREAT TOE METATARSOPHALANGEAL JOINT FUSION;  Surgeon: Newt Minion, MD;  Location: [facility];  Service: Orthopedics;  Laterality: Right;   BILATERAL SALPINGECTOMY Bilateral 06/28/2015   Procedure: BILATERAL SALPINGECTOMY;  Surgeon: Osborne Oman, MD;  Location: [facility];  Service: Gynecology;  Laterality: Bilateral;   CESAREAN SECTION     twice;  vertical incision x 2   COLONOSCOPY N/A 10/12/2013   Procedure: COLONOSCOPY;  Surgeon: Jerene Bears, MD;  [facility];  Service: Endoscopy;  Laterality: N/A;   CYSTO WITH HYDRODISTENSION N/A 05/27/2018   Procedure: CYSTOSCOPY/HYDRODISTENSION AND INSTILLATION;  Surgeon: Bjorn Loser, MD;  Location: [facility];  Service: Urology;  Laterality: N/A;   ESOPHAGEAL MANOMETRY N/A 06/29/2013   Procedure: ESOPHAGEAL MANOMETRY (EM);  Surgeon: Jerene Bears,  MD;  [facility];  Service: Gastroenterology;  Laterality: N/A;   FOOT ARTHRODESIS Right 05/25/2022   Procedure: RIGHT FOOT FUSION BASE FIRST  AND SECOND METATARSAL;  Surgeon: Newt Minion, MD;  Location: [facility];  Service: Orthopedics;  Laterality: Right;   FOREIGN BODY REMOVAL Left 2018   fiberglass, left thumb   HELLER MYOTOMY N/A 10/08/2013   Procedure: LAPAROSCOPIC HELLER MYOTOMY, DOR FUNDIPLICATION, UPPER ENDOSCOPY;  Surgeon: Odis Hollingshead, MD;  [facility];  Service: General;  Laterality: N/A;   HERNIA REPAIR  Umbilical   HYSTEROSCOPY WITH RESECTOSCOPE N/A 05/03/2015   Procedure: Diagnostic Hysteroscopy;  Surgeon: Lavonia Drafts, MD;  Location: [facility];  Service: Gynecology;  Laterality: N/A;  wants Novasure and myosure.   LYSIS OF ADHESION  06/28/2015   Procedure: LYSIS OF ADHESION;  Surgeon: Osborne Oman, MD;  Location: [facility];  Service: Gynecology;;   OTHER SURGICAL HISTORY     biopsy axillary ,right arm   right arm biopsy     UPPER GI ENDOSCOPY N/A 10/08/2013   Procedure: UPPER GI ENDOSCOPY;  Surgeon: Odis Hollingshead, MD;  [facility];  Service: General;  Laterality: N/A;   WISDOM TOOTH EXTRACTION     WRIST ARTHROSCOPY WITH DEBRIDEMENT Right 06/06/2021   Procedure: ARTHROSCOPY WRIST, DEBRIDEMENT TFCC, SYNOVECTOMY AND  EXTENSO CARPI ULNARIS  REPAIR;  Surgeon: Renette Butters, MD;  Location: [facility];  Service: Orthopedics;  Laterality: Right;   Social History   Occupational History   Occupation: House wife  Tobacco Use   Smoking status: Never   Smokeless tobacco: Never  Vaping Use   Vaping Use: Never used  Substance and Sexual Activity   Alcohol use: No   Drug use: No   Sexual activity: Not Currently    Partners: Male    Birth control/protection: Surgical

## 2022-10-01 ENCOUNTER — Other Ambulatory Visit: Payer: Self-pay | Admitting: Infectious Diseases

## 2022-10-01 DIAGNOSIS — B20 Human immunodeficiency virus [HIV] disease: Secondary | ICD-10-CM

## 2022-11-06 ENCOUNTER — Other Ambulatory Visit (INDEPENDENT_AMBULATORY_CARE_PROVIDER_SITE_OTHER): Payer: 59

## 2022-11-06 ENCOUNTER — Other Ambulatory Visit (HOSPITAL_COMMUNITY)
Admission: RE | Admit: 2022-11-06 | Discharge: 2022-11-06 | Disposition: A | Discharge status: Home / Self Care | Payer: 59 | Source: Ambulatory Visit | Attending: Infectious Diseases | Admitting: Infectious Diseases

## 2022-11-06 DIAGNOSIS — Z113 Encounter for screening for infections with a predominantly sexual mode of transmission: Secondary | ICD-10-CM | POA: Insufficient documentation

## 2022-11-06 DIAGNOSIS — B20 Human immunodeficiency virus [HIV] disease: Secondary | ICD-10-CM

## 2022-11-06 DIAGNOSIS — Z79899 Other long term (current) drug therapy: Secondary | ICD-10-CM | POA: Diagnosis not present

## 2022-11-07 LAB — COMPREHENSIVE METABOLIC PANEL
ALT: 12 IU/L (ref 0–32)
AST: 12 IU/L (ref 0–40)
Albumin/Globulin Ratio: 1.7 (ref 1.2–2.2)
Albumin: 4.5 g/dL (ref 3.9–4.9)
Alkaline Phosphatase: 127 IU/L — ABNORMAL HIGH (ref 44–121)
BUN/Creatinine Ratio: 11 (ref 9–23)
BUN: 12 mg/dL (ref 6–24)
Bilirubin Total: 0.3 mg/dL (ref 0.0–1.2)
CO2: 24 mmol/L (ref 20–29)
Calcium: 9.8 mg/dL (ref 8.7–10.2)
Chloride: 105 mmol/L (ref 96–106)
Creatinine, Ser: 1.13 mg/dL — ABNORMAL HIGH (ref 0.57–1.00)
Globulin, Total: 2.6 g/dL (ref 1.5–4.5)
Glucose: 89 mg/dL (ref 70–99)
Potassium: 4.3 mmol/L (ref 3.5–5.2)
Sodium: 144 mmol/L (ref 134–144)
Total Protein: 7.1 g/dL (ref 6.0–8.5)
eGFR: 60 mL/min/{1.73_m2} (ref >59)

## 2022-11-07 LAB — LIPID PANEL
Chol/HDL Ratio: 4 ratio (ref 0.0–4.4)
Cholesterol, Total: 201 mg/dL — ABNORMAL HIGH (ref 100–199)
HDL: 50 mg/dL (ref >39)
LDL Chol Calc (NIH): 120 mg/dL — ABNORMAL HIGH (ref 0–99)
Triglycerides: 174 mg/dL — ABNORMAL HIGH (ref 0–149)
VLDL Cholesterol Cal: 31 mg/dL (ref 5–40)

## 2022-11-07 LAB — URINE CYTOLOGY ANCILLARY ONLY
Chlamydia: NEGATIVE
Comment: NEGATIVE
Comment: NORMAL
Neisseria Gonorrhea: NEGATIVE

## 2022-11-07 LAB — CBC
Hematocrit: 41.9 % (ref 34.0–46.6)
Hemoglobin: 13.8 g/dL (ref 11.1–15.9)
MCH: 29.8 pg (ref 26.6–33.0)
MCHC: 32.9 g/dL (ref 31.5–35.7)
MCV: 91 fL (ref 79–97)
Platelets: 240 10*3/uL (ref 150–450)
RBC: 4.63 x10E6/uL (ref 3.77–5.28)
RDW: 13.1 % (ref 11.7–15.4)
WBC: 3.9 10*3/uL (ref 3.4–10.8)

## 2022-11-07 LAB — HIV-1 RNA QUANT-NO REFLEX-BLD: HIV-1 RNA Viral Load: <20 copies/mL

## 2022-11-07 LAB — RPR: RPR Ser Ql: NONREACTIVE

## 2022-11-07 LAB — T-HELPER CELL (CD4) - (RCID CLINIC ONLY)
CD4 % Helper T Cell: 38 % (ref 33–65)
CD4 T Cell Abs: 705 /uL (ref 400–1790)

## 2022-11-13 ENCOUNTER — Ambulatory Visit (INDEPENDENT_AMBULATORY_CARE_PROVIDER_SITE_OTHER): Payer: 59 | Admitting: Infectious Diseases

## 2022-11-13 ENCOUNTER — Encounter: Payer: Self-pay | Admitting: Infectious Diseases

## 2022-11-13 VITALS — BP 138/76 | HR 81 | Temp 99.6°F | Ht 61.0 in | Wt 182.4 lb

## 2022-11-13 DIAGNOSIS — Z79899 Other long term (current) drug therapy: Secondary | ICD-10-CM

## 2022-11-13 DIAGNOSIS — S93324S Dislocation of tarsometatarsal joint of right foot, sequela: Secondary | ICD-10-CM

## 2022-11-13 DIAGNOSIS — Z113 Encounter for screening for infections with a predominantly sexual mode of transmission: Secondary | ICD-10-CM

## 2022-11-13 DIAGNOSIS — S93324D Dislocation of tarsometatarsal joint of right foot, subsequent encounter: Secondary | ICD-10-CM | POA: Diagnosis not present

## 2022-11-13 DIAGNOSIS — N3942 Incontinence without sensory awareness: Secondary | ICD-10-CM

## 2022-11-13 DIAGNOSIS — E669 Obesity, unspecified: Secondary | ICD-10-CM

## 2022-11-13 DIAGNOSIS — B20 Human immunodeficiency virus [HIV] disease: Secondary | ICD-10-CM

## 2022-11-13 MED ORDER — GEMTESA 75 MG PO TABS: 75.0000 mg | ORAL_TABLET | Freq: Every day | ORAL | 3 refills | Status: AC

## 2022-11-13 NOTE — Assessment & Plan Note (Addendum)
She is doing well No change in ART No Flu or COVID vax this year. She will get next fall.... Husband not taking his descovey that she is aware of ... Will see her back in 9 months. Labs prior.  Needs PAP (Ms Doren Custard), mammo (got letter in mail, she will call) this year.
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
## 2022-11-13 NOTE — Assessment & Plan Note (Signed)
She is doing well I appreciate Dr Jess Barters f/u.
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
## 2023-01-15 ENCOUNTER — Ambulatory Visit (INDEPENDENT_AMBULATORY_CARE_PROVIDER_SITE_OTHER): Payer: 59

## 2023-01-15 ENCOUNTER — Other Ambulatory Visit: Payer: Self-pay | Admitting: Infectious Diseases

## 2023-01-15 ENCOUNTER — Ambulatory Visit (HOSPITAL_COMMUNITY)
Admission: EM | Admit: 2023-01-15 | Discharge: 2023-01-15 | Disposition: A | Discharge status: Home / Self Care | Payer: 59 | Attending: Family Medicine | Admitting: Family Medicine

## 2023-01-15 ENCOUNTER — Encounter (HOSPITAL_COMMUNITY): Payer: Self-pay | Admitting: Emergency Medicine

## 2023-01-15 DIAGNOSIS — M25511 Pain in right shoulder: Secondary | ICD-10-CM

## 2023-01-15 DIAGNOSIS — R21 Rash and other nonspecific skin eruption: Secondary | ICD-10-CM

## 2023-01-15 DIAGNOSIS — Z0000 Encounter for general adult medical examination without abnormal findings: Secondary | ICD-10-CM

## 2023-01-15 MED ORDER — PREDNISONE 20 MG PO TABS: 40.0000 mg | ORAL_TABLET | Freq: Every day | ORAL | 0 refills | Status: AC

## 2023-01-15 MED ORDER — TRIAMCINOLONE ACETONIDE 0.1 % EX CREA: 1.0000 | TOPICAL_CREAM | Freq: Two times a day (BID) | CUTANEOUS | 0 refills | Status: AC

## 2023-01-15 NOTE — ED Triage Notes (Signed)
Pt reports a possible insect bite on her rt hand. States she noticed one small raised bump yesterday and woke up and noticed two more appeared.  Also reports rt shoulder pain x 1 month. Denies any obvious injury.

## 2023-01-15 NOTE — Discharge Instructions (Signed)
Your x-ray did not show any bony problem   Take prednisone 20 mg--2 daily for 5 days  Apply triamcinolone cream to the rash 2 times daily until improved.

## 2023-01-15 NOTE — ED Provider Notes (Signed)
[facility]    CSN: [id number] Arrival date & time: 01/15/23  0932      History   Chief Complaint Chief Complaint  Patient presents with   Shoulder Pain   Insect Bite    HPI Danielle [name] is a 52 y.o. female.    Shoulder Pain  Here for right shoulder pain has been bothering her in the last month.  No known trauma or injury.  No numbness or tingling in the hand and no radiation of pain.  The pain is on the right anterior shoulder.  No rash or fever  She also notes a couple of little small blisters on her right middle finger.  She noticed 1 yesterday after being outside and then another 1 this morning.  It is not itching and is not really hurting.    Past Medical History:  Diagnosis Date   Abnormal uterine bleeding (AUB)    Achalasia    Type 2   Anemia    Anxiety    Asthma    Bipolar disorder (HCC)    Mixed   Bulging lumbar disc    Chronic back pain    Condyloma acuminata    Depression    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   Ganglion cyst    Right hand   Genital HSV    GERD (gastroesophageal reflux disease)    Hepatitis    type B treated   HIV infection (HCC)    Migraines    Pneumonia    Pre-diabetes    Trichimoniasis    Urinary incontinence    Uterine fibroid    Wears glasses     Patient Active Problem List   Diagnosis Date Noted   Lisfranc dislocation, right, sequela    Back pain due to injury 04/24/2022   Candidal vaginitis 01/09/2022   Screening for cervical cancer 01/09/2022   Wrist injury, right, sequela 12/29/2021   Obesity (BMI 30-39.9) 02/02/2020   Hallux rigidus, right foot 03/11/2018   Achilles tendon contracture, right 03/11/2018   Impingement syndrome of right ankle    Sprain of calcaneofibular ligament of right ankle 07/04/2017   Ankle pain, right 06/24/2017   Hyperglycemia 02/18/2017   Hepatitis B immune 02/18/2017   Fibroadenoma of breast 02/18/2017   S/P total abdominal hysterectomy 06/28/2015   Condyloma  acuminatum 02/23/2015   Urinary incontinence 01/05/2015   Abnormal finding on GI tract imaging 10/12/2013   Anemia-chronic 10/09/2013   Achalasia s/p laparoscopic Heller myotomy and Dor fundoplication 07/29/2013   Ganglion cyst of finger of right hand 07/06/2013   GERD (gastroesophageal reflux disease) 03/16/2013   Depression 02/10/2009   Human immunodeficiency virus (HIV) disease (HCC) 03/24/2007   Anxiety state 03/24/2007   ASTHMA 03/24/2007    Past Surgical History:  Procedure Laterality Date   ABDOMINAL HYSTERECTOMY N/A 06/28/2015   Procedure: HYSTERECTOMY ABDOMINAL;  Surgeon: Tereso Newcomer, MD;  Location: [facility];  Service: Gynecology;  Laterality: N/A;  Requested 06/28/15 @ 7:30a   ANKLE ARTHROSCOPY Right 10/16/2017   Procedure: RIGHT ANKLE ARTHROSCOPIC DEBRIDEMENT;  Surgeon: Nadara Mustard, MD;  Location: [facility];  Service: Orthopedics;  Laterality: Right;   ARTHRODESIS METATARSALPHALANGEAL JOINT (MTPJ) Right 07/23/2018   Procedure: RIGHT GREAT TOE METATARSOPHALANGEAL JOINT FUSION;  Surgeon: Nadara Mustard, MD;  Location: [facility] OR;  Service: Orthopedics;  Laterality: Right;   BILATERAL SALPINGECTOMY Bilateral 06/28/2015   Procedure: BILATERAL SALPINGECTOMY;  Surgeon: Tereso Newcomer, MD;  Location: [facility];  Service: Gynecology;  Laterality: Bilateral;  CESAREAN SECTION     twice;  vertical incision x 2   COLONOSCOPY N/A 10/12/2013   Procedure: COLONOSCOPY;  Surgeon: Beverley Fiedler, MD;  Location: [facility];  Service: Endoscopy;  Laterality: N/A;   CYSTO WITH HYDRODISTENSION N/A 05/27/2018   Procedure: CYSTOSCOPY/HYDRODISTENSION AND INSTILLATION;  Surgeon: Alfredo Martinez, MD;  Location: [facility];  Service: Urology;  Laterality: N/A;   ESOPHAGEAL MANOMETRY N/A 06/29/2013   Procedure: ESOPHAGEAL MANOMETRY (EM);  Surgeon: Beverley Fiedler, MD;  Location: [facility];  Service: Gastroenterology;  Laterality: N/A;   FOOT ARTHRODESIS Right 05/25/2022   Procedure: RIGHT  FOOT FUSION BASE FIRST  AND SECOND METATARSAL;  Surgeon: Nadara Mustard, MD;  Location: [facility];  Service: Orthopedics;  Laterality: Right;   FOREIGN BODY REMOVAL Left 2018   fiberglass, left thumb   HELLER MYOTOMY N/A 10/08/2013   Procedure: LAPAROSCOPIC HELLER MYOTOMY, DOR FUNDIPLICATION, UPPER ENDOSCOPY;  Surgeon: Adolph Pollack, MD;  Location: [facility];  Service: General;  Laterality: N/A;   HERNIA REPAIR     Umbilical   HYSTEROSCOPY WITH RESECTOSCOPE N/A 05/03/2015   Procedure: Diagnostic Hysteroscopy;  Surgeon: Willodean Rosenthal, MD;  Location: [facility];  Service: Gynecology;  Laterality: N/A;  wants Novasure and myosure.   LYSIS OF ADHESION  06/28/2015   Procedure: LYSIS OF ADHESION;  Surgeon: Tereso Newcomer, MD;  Location: [facility];  Service: Gynecology;;   OTHER SURGICAL HISTORY     biopsy axillary ,right arm   right arm biopsy     UPPER GI ENDOSCOPY N/A 10/08/2013   Procedure: UPPER GI ENDOSCOPY;  Surgeon: Adolph Pollack, MD;  Location: [facility];  Service: General;  Laterality: N/A;   WISDOM TOOTH EXTRACTION     WRIST ARTHROSCOPY WITH DEBRIDEMENT Right 06/06/2021   Procedure: ARTHROSCOPY WRIST, DEBRIDEMENT TFCC, SYNOVECTOMY AND  EXTENSO CARPI ULNARIS  REPAIR;  Surgeon: Sheral Apley, MD;  Location: [facility];  Service: Orthopedics;  Laterality: Right;    OB History     Gravida  5   Para  3   Term  3   Preterm      AB  2   Living  3      SAB  2   IAB      Ectopic      Multiple      Live Births               Home Medications    Prior to Admission medications   Medication Sig Start Date End Date Taking? Authorizing Provider  predniSONE (DELTASONE) 20 MG tablet Take 2 tablets (40 mg total) by mouth daily with breakfast for 5 days. 01/15/23 01/20/23 Yes Maryuri Warnke, Janace Aris, MD  triamcinolone cream (KENALOG) 0.1 % Apply 1 Application topically 2 (two) times daily. To affected area till better 01/15/23  Yes Gaynell Eggleton, Janace Aris, MD   atorvastatin (LIPITOR) 10 MG tablet Take 10 mg by mouth daily. 04/09/19   [provider]  baclofen (LIORESAL) 10 MG tablet Take 10 mg by mouth every 8 (eight) hours as needed for muscle spasms. 04/16/22   [provider]  BIKTARVY 50-200-25 MG TABS tablet TAKE 1 TABLET BY MOUTH EVERY DAY 10/01/22   Ginnie Smart, MD  GEMTESA 75 MG TABS Take 1 tablet (75 mg total) by mouth daily. 11/13/22   Ginnie Smart, MD  naloxone [facility]) nasal spray 4 mg/0.1 mL Use prn 06/13/22   Adonis Huguenin, NP  PROAIR HFA 108 (  90 BASE) MCG/ACT inhaler Inhale 2 puffs into the lungs every 4 (four) hours as needed for wheezing or shortness of breath.  12/31/14   [provider]  valACYclovir (VALTREX) 500 MG tablet TAKE 1 TABLET EVERY DAY 01/02/22   Ginnie Smart, MD    Family History Family History  Problem Relation Age of Onset   Hyperlipidemia Mother    Diabetes Mother    Stroke Mother    Diabetes Brother    Colon cancer Father    Esophageal cancer Neg Hx    Rectal cancer Neg Hx    Stomach cancer Neg Hx     Social History Social History   Tobacco Use   Smoking status: Never   Smokeless tobacco: Never  Vaping Use   Vaping Use: Never used  Substance Use Topics   Alcohol use: No   Drug use: No     Allergies   Latex   Review of Systems Review of Systems   Physical Exam Triage Vital Signs ED Triage Vitals  Enc Vitals Group     BP 01/15/23 1044 136/83     Pulse Rate 01/15/23 1044 71     Resp 01/15/23 1044 17     Temp 01/15/23 1044 98.5 F (36.9 C)     Temp Source 01/15/23 1044 Oral     SpO2 01/15/23 1044 97 %     Weight --      Height --      Head Circumference --      Peak Flow --      Pain Score 01/15/23 1042 7     Pain Loc --      Pain Edu? --      Excl. in GC? --    No data found.  Updated Vital Signs BP 136/83 (BP Location: Left Arm)   Pulse 71   Temp 98.5 F (36.9 C) (Oral)   Resp 17   LMP 04/11/2015 (Approximate) Comment: unknown   SpO2 97%   Visual Acuity Right Eye Distance:   Left Eye Distance:   Bilateral Distance:    Right Eye Near:   Left Eye Near:    Bilateral Near:     Physical Exam Vitals reviewed.  Constitutional:      General: She is not in acute distress.    Appearance: She is not ill-appearing, toxic-appearing or diaphoretic.  HENT:     Mouth/Throat:     Mouth: Mucous membranes are moist.  Musculoskeletal:     Comments: There is some mild tenderness of the anterior right shoulder.  There is no spasm or tenderness of the trapezius, and there is good range of motion about the right shoulder.  There is an well-healed scar on the ulnar aspect of the right wrist.  Skin:    Coloration: Skin is not pale.     Comments: There are a couple of round 1 mm blisters on the radial aspect of her right middle finger at the PIP.  There is no erythema or ulceration or induration or tenderness  Neurological:     General: No focal deficit present.     Mental Status: She is alert and oriented to person, place, and time.  Psychiatric:        Behavior: Behavior normal.      UC Treatments / Results  Labs (all labs ordered are listed, but only abnormal results are displayed) Labs Reviewed - No data to display  EKG   Radiology DG Shoulder Right  Result Date:  01/15/2023 CLINICAL DATA:  Right shoulder pain over the last month EXAM: RIGHT SHOULDER - 2+ VIEW COMPARISON:  None Available. FINDINGS: There is no evidence of fracture or dislocation. There is no evidence of arthropathy or other focal bone abnormality. Soft tissues are unremarkable. IMPRESSION: Normal examination. No degenerative or traumatic findings. No finding that would predispose to impingement. Electronically Signed   By: Paulina Fusi M.D.   On: 01/15/2023 11:32    Procedures Procedures (including critical care time)  Medications Ordered in UC Medications - No data to display  Initial Impression / Assessment and Plan / UC Course  I have  reviewed the triage vital signs and the nursing notes.  Pertinent labs & imaging results that were available during my care of the patient were reviewed by me and considered in my medical decision making (see chart for details).        Shoulder x-ray is negative.  Prednisone for 5 days is sent in and triamcinolone sent in for the rash on her finger.  Have asked her to follow-up with orthopedics at the shoulder is not improving. Final Clinical Impressions(s) / UC Diagnoses   Final diagnoses:  Acute pain of right shoulder  Rash of hand     Discharge Instructions      Your x-ray did not show any bony problem   Take prednisone 20 mg--2 daily for 5 days  Apply triamcinolone cream to the rash 2 times daily until improved.     ED Prescriptions     Medication Sig Dispense Auth. Provider   triamcinolone cream (KENALOG) 0.1 % Apply 1 Application topically 2 (two) times daily. To affected area till better 80 g Zenia Resides, MD   predniSONE (DELTASONE) 20 MG tablet Take 2 tablets (40 mg total) by mouth daily with breakfast for 5 days. 10 tablet Marlinda Mike Janace Aris, MD      I have reviewed the PDMP during this encounter.   Zenia Resides, MD 01/15/23 [phone]

## 2023-01-23 ENCOUNTER — Ambulatory Visit
Admission: RE | Admit: 2023-01-23 | Discharge: 2023-01-23 | Disposition: A | Discharge status: Home / Self Care | Payer: 59 | Source: Ambulatory Visit | Attending: Infectious Diseases | Admitting: Infectious Diseases

## 2023-01-23 DIAGNOSIS — Z0000 Encounter for general adult medical examination without abnormal findings: Secondary | ICD-10-CM

## 2023-01-23 DIAGNOSIS — Z1231 Encounter for screening mammogram for malignant neoplasm of breast: Secondary | ICD-10-CM | POA: Diagnosis not present

## 2023-01-23 DIAGNOSIS — M25511 Pain in right shoulder: Secondary | ICD-10-CM | POA: Diagnosis not present

## 2023-01-30 DIAGNOSIS — M7551 Bursitis of right shoulder: Secondary | ICD-10-CM | POA: Diagnosis not present

## 2023-02-06 DIAGNOSIS — M7551 Bursitis of right shoulder: Secondary | ICD-10-CM | POA: Diagnosis not present

## 2023-02-18 ENCOUNTER — Other Ambulatory Visit: Payer: Self-pay | Admitting: Infectious Diseases

## 2023-02-18 DIAGNOSIS — B009 Herpesviral infection, unspecified: Secondary | ICD-10-CM

## 2023-02-18 NOTE — Telephone Encounter (Signed)
valACYclovir valACYclovir (VALTREX) 500 MG tablet   [id number] - [place], [place] - [address] AT [facility] OF [address] (Ph: [phone])

## 2023-02-19 MED ORDER — VALACYCLOVIR HCL 500 MG PO TABS: 500.0000 mg | ORAL_TABLET | Freq: Every day | ORAL | 3 refills | Status: DC

## 2023-02-20 DIAGNOSIS — M7551 Bursitis of right shoulder: Secondary | ICD-10-CM | POA: Diagnosis not present

## 2023-03-04 DIAGNOSIS — I10 Essential (primary) hypertension: Secondary | ICD-10-CM | POA: Diagnosis not present

## 2023-03-04 DIAGNOSIS — M79671 Pain in right foot: Secondary | ICD-10-CM | POA: Diagnosis not present

## 2023-03-04 DIAGNOSIS — J209 Acute bronchitis, unspecified: Secondary | ICD-10-CM | POA: Diagnosis not present

## 2023-03-04 DIAGNOSIS — R7303 Prediabetes: Secondary | ICD-10-CM | POA: Diagnosis not present

## 2023-03-04 DIAGNOSIS — E782 Mixed hyperlipidemia: Secondary | ICD-10-CM | POA: Diagnosis not present

## 2023-03-06 DIAGNOSIS — M7551 Bursitis of right shoulder: Secondary | ICD-10-CM | POA: Diagnosis not present

## 2023-03-09 DIAGNOSIS — M25511 Pain in right shoulder: Secondary | ICD-10-CM | POA: Diagnosis not present

## 2023-03-18 DIAGNOSIS — M25511 Pain in right shoulder: Secondary | ICD-10-CM | POA: Diagnosis not present

## 2023-03-28 DIAGNOSIS — S46211D Strain of muscle, fascia and tendon of other parts of biceps, right arm, subsequent encounter: Secondary | ICD-10-CM | POA: Diagnosis not present

## 2023-04-01 DIAGNOSIS — M25511 Pain in right shoulder: Secondary | ICD-10-CM | POA: Diagnosis not present

## 2023-04-11 DIAGNOSIS — S46211D Strain of muscle, fascia and tendon of other parts of biceps, right arm, subsequent encounter: Secondary | ICD-10-CM | POA: Diagnosis not present

## 2023-04-15 DIAGNOSIS — S46211D Strain of muscle, fascia and tendon of other parts of biceps, right arm, subsequent encounter: Secondary | ICD-10-CM | POA: Diagnosis not present

## 2023-04-21 ENCOUNTER — Other Ambulatory Visit: Payer: Self-pay | Admitting: Infectious Diseases

## 2023-04-21 DIAGNOSIS — B20 Human immunodeficiency virus [HIV] disease: Secondary | ICD-10-CM

## 2023-04-29 DIAGNOSIS — S46211D Strain of muscle, fascia and tendon of other parts of biceps, right arm, subsequent encounter: Secondary | ICD-10-CM | POA: Diagnosis not present

## 2023-04-29 DIAGNOSIS — M25511 Pain in right shoulder: Secondary | ICD-10-CM | POA: Diagnosis not present

## 2023-05-01 DIAGNOSIS — S46211D Strain of muscle, fascia and tendon of other parts of biceps, right arm, subsequent encounter: Secondary | ICD-10-CM | POA: Diagnosis not present

## 2023-05-27 DIAGNOSIS — I10 Essential (primary) hypertension: Secondary | ICD-10-CM | POA: Diagnosis not present

## 2023-05-27 DIAGNOSIS — E782 Mixed hyperlipidemia: Secondary | ICD-10-CM | POA: Diagnosis not present

## 2023-05-27 DIAGNOSIS — R7303 Prediabetes: Secondary | ICD-10-CM | POA: Diagnosis not present

## 2023-08-14 ENCOUNTER — Other Ambulatory Visit: Payer: Self-pay

## 2023-08-14 DIAGNOSIS — B009 Herpesviral infection, unspecified: Secondary | ICD-10-CM

## 2023-08-14 MED ORDER — VALACYCLOVIR HCL 500 MG PO TABS: 500.0000 mg | ORAL_TABLET | Freq: Every day | ORAL | 3 refills | Status: DC

## 2023-08-26 ENCOUNTER — Other Ambulatory Visit: Payer: Self-pay

## 2023-08-26 DIAGNOSIS — R7303 Prediabetes: Secondary | ICD-10-CM | POA: Diagnosis not present

## 2023-08-26 DIAGNOSIS — I10 Essential (primary) hypertension: Secondary | ICD-10-CM | POA: Diagnosis not present

## 2023-08-26 DIAGNOSIS — E782 Mixed hyperlipidemia: Secondary | ICD-10-CM | POA: Diagnosis not present

## 2023-08-26 DIAGNOSIS — E559 Vitamin D deficiency, unspecified: Secondary | ICD-10-CM | POA: Diagnosis not present

## 2023-08-26 MED ORDER — WEGOVY 0.25 MG/0.5ML ~~LOC~~ SOAJ
0.2500 mg | SUBCUTANEOUS | 0 refills | Status: AC
  Filled 2023-08-26: qty 2, 28d supply, fill #0

## 2023-08-27 ENCOUNTER — Other Ambulatory Visit: Payer: Self-pay

## 2023-08-29 ENCOUNTER — Other Ambulatory Visit: Payer: Self-pay

## 2023-09-02 ENCOUNTER — Other Ambulatory Visit: Payer: Self-pay

## 2023-09-13 ENCOUNTER — Other Ambulatory Visit (INDEPENDENT_AMBULATORY_CARE_PROVIDER_SITE_OTHER): Payer: 59

## 2023-09-13 DIAGNOSIS — Z79899 Other long term (current) drug therapy: Secondary | ICD-10-CM | POA: Diagnosis not present

## 2023-09-13 DIAGNOSIS — Z113 Encounter for screening for infections with a predominantly sexual mode of transmission: Secondary | ICD-10-CM

## 2023-09-13 DIAGNOSIS — B20 Human immunodeficiency virus [HIV] disease: Secondary | ICD-10-CM | POA: Diagnosis not present

## 2023-09-13 LAB — T-HELPER CELL (CD4) - (RCID CLINIC ONLY)
CD4 % Helper T Cell: 37 % (ref 33–65)
CD4 T Cell Abs: 690 /uL (ref 400–1790)

## 2023-09-14 LAB — COMPREHENSIVE METABOLIC PANEL
ALT: 13 [IU]/L (ref 0–32)
AST: 15 [IU]/L (ref 0–40)
Albumin: 4.6 g/dL (ref 3.9–4.9)
Alkaline Phosphatase: 193 [IU]/L — ABNORMAL HIGH (ref 44–121)
BUN/Creatinine Ratio: 14 (ref 9–23)
BUN: 16 mg/dL (ref 6–24)
Bilirubin Total: 0.2 mg/dL (ref 0.0–1.2)
CO2: 25 mmol/L (ref 20–29)
Calcium: 9.8 mg/dL (ref 8.7–10.2)
Chloride: 104 mmol/L (ref 96–106)
Creatinine, Ser: 1.16 mg/dL — ABNORMAL HIGH (ref 0.57–1.00)
Globulin, Total: 2.7 g/dL (ref 1.5–4.5)
Glucose: 99 mg/dL (ref 70–99)
Potassium: 3.8 mmol/L (ref 3.5–5.2)
Sodium: 143 mmol/L (ref 134–144)
Total Protein: 7.3 g/dL (ref 6.0–8.5)
eGFR: 57 mL/min/{1.73_m2} — ABNORMAL LOW (ref >59)

## 2023-09-14 LAB — LIPID PANEL
Chol/HDL Ratio: 2.9 {ratio} (ref 0.0–4.4)
Cholesterol, Total: 172 mg/dL (ref 100–199)
HDL: 60 mg/dL (ref >39)
LDL Chol Calc (NIH): 91 mg/dL (ref 0–99)
Triglycerides: 116 mg/dL (ref 0–149)
VLDL Cholesterol Cal: 21 mg/dL (ref 5–40)

## 2023-09-14 LAB — RPR: RPR Ser Ql: NONREACTIVE

## 2023-09-14 LAB — CBC
Hematocrit: 39.6 % (ref 34.0–46.6)
Hemoglobin: 13.1 g/dL (ref 11.1–15.9)
MCH: 29.8 pg (ref 26.6–33.0)
MCHC: 33.1 g/dL (ref 31.5–35.7)
MCV: 90 fL (ref 79–97)
Platelets: 212 10*3/uL (ref 150–450)
RBC: 4.39 x10E6/uL (ref 3.77–5.28)
RDW: 12.3 % (ref 11.7–15.4)
WBC: 4.1 10*3/uL (ref 3.4–10.8)

## 2023-09-14 LAB — HIV-1 RNA QUANT-NO REFLEX-BLD
HIV-1 RNA Viral Load Log: 2.255 {Log_copies}/mL
HIV-1 RNA Viral Load: 180 {copies}/mL

## 2023-09-24 ENCOUNTER — Other Ambulatory Visit: Payer: Self-pay

## 2023-09-24 ENCOUNTER — Other Ambulatory Visit (HOSPITAL_COMMUNITY)
Admission: RE | Admit: 2023-09-24 | Discharge: 2023-09-24 | Disposition: A | Discharge status: Home / Self Care | Payer: 59 | Source: Ambulatory Visit | Attending: Infectious Diseases | Admitting: Infectious Diseases

## 2023-09-24 ENCOUNTER — Encounter: Payer: Self-pay | Admitting: Infectious Diseases

## 2023-09-24 ENCOUNTER — Encounter: Payer: 59 | Admitting: Infectious Diseases

## 2023-09-24 ENCOUNTER — Ambulatory Visit: Payer: 59 | Admitting: Infectious Diseases

## 2023-09-24 VITALS — BP 117/78 | HR 80 | Temp 99.2°F | Resp 32 | Ht 61.0 in | Wt 179.4 lb

## 2023-09-24 DIAGNOSIS — R32 Unspecified urinary incontinence: Secondary | ICD-10-CM | POA: Diagnosis not present

## 2023-09-24 DIAGNOSIS — E785 Hyperlipidemia, unspecified: Secondary | ICD-10-CM | POA: Diagnosis not present

## 2023-09-24 DIAGNOSIS — Z113 Encounter for screening for infections with a predominantly sexual mode of transmission: Secondary | ICD-10-CM

## 2023-09-24 DIAGNOSIS — E669 Obesity, unspecified: Secondary | ICD-10-CM | POA: Diagnosis not present

## 2023-09-24 DIAGNOSIS — J45909 Unspecified asthma, uncomplicated: Secondary | ICD-10-CM | POA: Diagnosis not present

## 2023-09-24 DIAGNOSIS — N3942 Incontinence without sensory awareness: Secondary | ICD-10-CM

## 2023-09-24 DIAGNOSIS — B20 Human immunodeficiency virus [HIV] disease: Secondary | ICD-10-CM

## 2023-09-24 DIAGNOSIS — Z79899 Other long term (current) drug therapy: Secondary | ICD-10-CM

## 2023-09-24 DIAGNOSIS — J4520 Mild intermittent asthma, uncomplicated: Secondary | ICD-10-CM

## 2023-09-24 DIAGNOSIS — Z6833 Body mass index (BMI) 33.0-33.9, adult: Secondary | ICD-10-CM

## 2023-09-24 NOTE — Assessment & Plan Note (Addendum)
 She is doing well Has a blip Does not want covid vax or PCV20 today.  Discussed cabanuva, she does not want.  Husband is off prep.  No change in meds Rtc in 3-6 months.

## 2023-09-24 NOTE — Assessment & Plan Note (Signed)
 She is doing well on statin, lipids at goal.  Lfts normal.  Lab Results  Component Value Date   CHOL 172 09/13/2023   HDL 60 09/13/2023   LDLCALC 91 09/13/2023   TRIG 116 09/13/2023   CHOLHDL 2.9 09/13/2023

## 2023-09-24 NOTE — Assessment & Plan Note (Signed)
 Appreciate PCP f/u and trying to get wt loss rx.
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
## 2023-09-24 NOTE — Addendum Note (Signed)
 Addended by: Bufford Spikes on: 09/24/2023 02:06 PM   Modules accepted: Orders

## 2023-09-24 NOTE — Assessment & Plan Note (Signed)
 She has inhalers. Denies sob/cough.

## 2023-09-24 NOTE — Assessment & Plan Note (Signed)
 She describes need for stimulator, she is hesitant to have this as she does not want to be cut on any more.

## 2023-09-25 LAB — URINE CYTOLOGY ANCILLARY ONLY
Chlamydia: NEGATIVE
Comment: NEGATIVE
Comment: NORMAL
Neisseria Gonorrhea: NEGATIVE

## 2023-10-04 DIAGNOSIS — M545 Low back pain, unspecified: Secondary | ICD-10-CM | POA: Diagnosis not present

## 2023-10-08 ENCOUNTER — Encounter: Payer: 59 | Admitting: Infectious Diseases

## 2023-10-14 DIAGNOSIS — M545 Low back pain, unspecified: Secondary | ICD-10-CM | POA: Diagnosis not present

## 2023-10-30 ENCOUNTER — Ambulatory Visit: Payer: 59 | Admitting: Orthopedic Surgery

## 2023-11-05 ENCOUNTER — Ambulatory Visit (INDEPENDENT_AMBULATORY_CARE_PROVIDER_SITE_OTHER): Payer: 59 | Admitting: Orthopedic Surgery

## 2023-11-05 ENCOUNTER — Other Ambulatory Visit (INDEPENDENT_AMBULATORY_CARE_PROVIDER_SITE_OTHER): Payer: 59

## 2023-11-05 DIAGNOSIS — M5441 Lumbago with sciatica, right side: Secondary | ICD-10-CM

## 2023-11-05 DIAGNOSIS — G8929 Other chronic pain: Secondary | ICD-10-CM

## 2023-11-05 DIAGNOSIS — M5442 Lumbago with sciatica, left side: Secondary | ICD-10-CM

## 2023-11-05 MED ORDER — TRAMADOL HCL 50 MG PO TABS: 50.0000 mg | ORAL_TABLET | Freq: Four times a day (QID) | ORAL | 0 refills | Status: AC | PRN

## 2023-11-05 MED ORDER — CYCLOBENZAPRINE HCL 10 MG PO TABS: 10.0000 mg | ORAL_TABLET | Freq: Three times a day (TID) | ORAL | 0 refills | Status: AC | PRN
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
## 2023-11-18 DIAGNOSIS — M48061 Spinal stenosis, lumbar region without neurogenic claudication: Secondary | ICD-10-CM | POA: Diagnosis not present

## 2023-11-18 DIAGNOSIS — Z79899 Other long term (current) drug therapy: Secondary | ICD-10-CM | POA: Diagnosis not present

## 2023-11-18 DIAGNOSIS — R7303 Prediabetes: Secondary | ICD-10-CM | POA: Diagnosis not present

## 2023-11-18 DIAGNOSIS — M129 Arthropathy, unspecified: Secondary | ICD-10-CM | POA: Diagnosis not present

## 2023-11-18 DIAGNOSIS — M549 Dorsalgia, unspecified: Secondary | ICD-10-CM | POA: Diagnosis not present

## 2023-11-18 DIAGNOSIS — G8929 Other chronic pain: Secondary | ICD-10-CM | POA: Diagnosis not present

## 2023-11-25 DIAGNOSIS — G8929 Other chronic pain: Secondary | ICD-10-CM | POA: Diagnosis not present

## 2023-11-25 DIAGNOSIS — Z79899 Other long term (current) drug therapy: Secondary | ICD-10-CM | POA: Diagnosis not present

## 2023-11-25 DIAGNOSIS — M48061 Spinal stenosis, lumbar region without neurogenic claudication: Secondary | ICD-10-CM | POA: Diagnosis not present

## 2023-11-25 DIAGNOSIS — M549 Dorsalgia, unspecified: Secondary | ICD-10-CM | POA: Diagnosis not present

## 2023-12-19 DIAGNOSIS — M549 Dorsalgia, unspecified: Secondary | ICD-10-CM | POA: Diagnosis not present

## 2023-12-19 DIAGNOSIS — M48061 Spinal stenosis, lumbar region without neurogenic claudication: Secondary | ICD-10-CM | POA: Diagnosis not present

## 2023-12-19 DIAGNOSIS — Z79899 Other long term (current) drug therapy: Secondary | ICD-10-CM | POA: Diagnosis not present

## 2023-12-19 DIAGNOSIS — G8929 Other chronic pain: Secondary | ICD-10-CM | POA: Diagnosis not present

## 2023-12-25 DIAGNOSIS — Z79899 Other long term (current) drug therapy: Secondary | ICD-10-CM | POA: Diagnosis not present

## 2023-12-31 ENCOUNTER — Other Ambulatory Visit: Payer: Self-pay | Admitting: Infectious Diseases

## 2023-12-31 DIAGNOSIS — B20 Human immunodeficiency virus [HIV] disease: Secondary | ICD-10-CM

## 2024-01-06 DIAGNOSIS — R7303 Prediabetes: Secondary | ICD-10-CM | POA: Diagnosis not present

## 2024-01-06 DIAGNOSIS — E559 Vitamin D deficiency, unspecified: Secondary | ICD-10-CM | POA: Diagnosis not present

## 2024-01-06 DIAGNOSIS — I10 Essential (primary) hypertension: Secondary | ICD-10-CM | POA: Diagnosis not present

## 2024-01-06 DIAGNOSIS — E782 Mixed hyperlipidemia: Secondary | ICD-10-CM | POA: Diagnosis not present

## 2024-01-24 DIAGNOSIS — Z79899 Other long term (current) drug therapy: Secondary | ICD-10-CM | POA: Diagnosis not present

## 2024-01-24 DIAGNOSIS — R030 Elevated blood-pressure reading, without diagnosis of hypertension: Secondary | ICD-10-CM | POA: Diagnosis not present

## 2024-01-24 DIAGNOSIS — M549 Dorsalgia, unspecified: Secondary | ICD-10-CM | POA: Diagnosis not present

## 2024-01-24 DIAGNOSIS — G8929 Other chronic pain: Secondary | ICD-10-CM | POA: Diagnosis not present

## 2024-01-24 DIAGNOSIS — M48061 Spinal stenosis, lumbar region without neurogenic claudication: Secondary | ICD-10-CM | POA: Diagnosis not present

## 2024-01-24 DIAGNOSIS — Z0000 Encounter for general adult medical examination without abnormal findings: Secondary | ICD-10-CM | POA: Diagnosis not present

## 2024-01-28 DIAGNOSIS — Z79899 Other long term (current) drug therapy: Secondary | ICD-10-CM | POA: Diagnosis not present

## 2024-02-05 DIAGNOSIS — M654 Radial styloid tenosynovitis [de Quervain]: Secondary | ICD-10-CM | POA: Diagnosis not present

## 2024-02-10 DIAGNOSIS — M654 Radial styloid tenosynovitis [de Quervain]: Secondary | ICD-10-CM | POA: Diagnosis not present

## 2024-02-24 DIAGNOSIS — M48061 Spinal stenosis, lumbar region without neurogenic claudication: Secondary | ICD-10-CM | POA: Diagnosis not present

## 2024-02-24 DIAGNOSIS — R030 Elevated blood-pressure reading, without diagnosis of hypertension: Secondary | ICD-10-CM | POA: Diagnosis not present

## 2024-02-24 DIAGNOSIS — Z79899 Other long term (current) drug therapy: Secondary | ICD-10-CM | POA: Diagnosis not present

## 2024-02-24 DIAGNOSIS — R29818 Other symptoms and signs involving the nervous system: Secondary | ICD-10-CM | POA: Diagnosis not present

## 2024-02-24 DIAGNOSIS — Z1211 Encounter for screening for malignant neoplasm of colon: Secondary | ICD-10-CM | POA: Diagnosis not present

## 2024-03-02 ENCOUNTER — Other Ambulatory Visit: Payer: Self-pay

## 2024-03-02 DIAGNOSIS — B009 Herpesviral infection, unspecified: Secondary | ICD-10-CM

## 2024-03-03 MED ORDER — VALACYCLOVIR HCL 500 MG PO TABS: 500.0000 mg | ORAL_TABLET | Freq: Every day | ORAL | 3 refills | Status: AC

## 2024-03-25 DIAGNOSIS — R29818 Other symptoms and signs involving the nervous system: Secondary | ICD-10-CM | POA: Diagnosis not present

## 2024-03-25 DIAGNOSIS — Z780 Asymptomatic menopausal state: Secondary | ICD-10-CM | POA: Diagnosis not present

## 2024-03-25 DIAGNOSIS — M48061 Spinal stenosis, lumbar region without neurogenic claudication: Secondary | ICD-10-CM | POA: Diagnosis not present

## 2024-03-25 DIAGNOSIS — Z79899 Other long term (current) drug therapy: Secondary | ICD-10-CM | POA: Diagnosis not present

## 2024-03-31 DIAGNOSIS — Z780 Asymptomatic menopausal state: Secondary | ICD-10-CM | POA: Diagnosis not present

## 2024-04-10 DIAGNOSIS — N3281 Overactive bladder: Secondary | ICD-10-CM | POA: Diagnosis not present

## 2024-04-10 DIAGNOSIS — N183 Chronic kidney disease, stage 3 unspecified: Secondary | ICD-10-CM | POA: Diagnosis not present

## 2024-04-10 DIAGNOSIS — R7303 Prediabetes: Secondary | ICD-10-CM | POA: Diagnosis not present

## 2024-04-10 DIAGNOSIS — G5601 Carpal tunnel syndrome, right upper limb: Secondary | ICD-10-CM | POA: Diagnosis not present

## 2024-04-10 DIAGNOSIS — I129 Hypertensive chronic kidney disease with stage 1 through stage 4 chronic kidney disease, or unspecified chronic kidney disease: Secondary | ICD-10-CM | POA: Diagnosis not present

## 2024-04-24 DIAGNOSIS — Z1211 Encounter for screening for malignant neoplasm of colon: Secondary | ICD-10-CM | POA: Diagnosis not present

## 2024-04-24 DIAGNOSIS — I10 Essential (primary) hypertension: Secondary | ICD-10-CM | POA: Diagnosis not present

## 2024-04-24 DIAGNOSIS — R030 Elevated blood-pressure reading, without diagnosis of hypertension: Secondary | ICD-10-CM | POA: Diagnosis not present

## 2024-04-24 DIAGNOSIS — Z79899 Other long term (current) drug therapy: Secondary | ICD-10-CM | POA: Diagnosis not present

## 2024-04-24 DIAGNOSIS — M48061 Spinal stenosis, lumbar region without neurogenic claudication: Secondary | ICD-10-CM | POA: Diagnosis not present

## 2024-04-24 DIAGNOSIS — E7800 Pure hypercholesterolemia, unspecified: Secondary | ICD-10-CM | POA: Diagnosis not present

## 2024-04-28 ENCOUNTER — Other Ambulatory Visit: Payer: Self-pay | Admitting: Family Medicine

## 2024-04-28 DIAGNOSIS — Z1231 Encounter for screening mammogram for malignant neoplasm of breast: Secondary | ICD-10-CM

## 2024-05-07 ENCOUNTER — Ambulatory Visit
Admission: RE | Admit: 2024-05-07 | Discharge: 2024-05-07 | Disposition: A | Discharge status: Home / Self Care | Source: Ambulatory Visit | Attending: Family Medicine | Admitting: Family Medicine

## 2024-05-07 DIAGNOSIS — Z1231 Encounter for screening mammogram for malignant neoplasm of breast: Secondary | ICD-10-CM

## 2024-05-12 LAB — COLOGUARD

## 2024-05-19 DIAGNOSIS — Z1211 Encounter for screening for malignant neoplasm of colon: Secondary | ICD-10-CM | POA: Diagnosis not present

## 2024-05-19 DIAGNOSIS — Z1212 Encounter for screening for malignant neoplasm of rectum: Secondary | ICD-10-CM | POA: Diagnosis not present

## 2024-05-21 DIAGNOSIS — R030 Elevated blood-pressure reading, without diagnosis of hypertension: Secondary | ICD-10-CM | POA: Diagnosis not present

## 2024-05-21 DIAGNOSIS — G4710 Hypersomnia, unspecified: Secondary | ICD-10-CM | POA: Diagnosis not present

## 2024-05-21 DIAGNOSIS — M545 Low back pain, unspecified: Secondary | ICD-10-CM | POA: Diagnosis not present

## 2024-05-21 DIAGNOSIS — K219 Gastro-esophageal reflux disease without esophagitis: Secondary | ICD-10-CM | POA: Diagnosis not present

## 2024-05-25 DIAGNOSIS — I10 Essential (primary) hypertension: Secondary | ICD-10-CM | POA: Diagnosis not present

## 2024-05-25 DIAGNOSIS — Z79899 Other long term (current) drug therapy: Secondary | ICD-10-CM | POA: Diagnosis not present

## 2024-05-25 DIAGNOSIS — R030 Elevated blood-pressure reading, without diagnosis of hypertension: Secondary | ICD-10-CM | POA: Diagnosis not present

## 2024-05-25 DIAGNOSIS — M48061 Spinal stenosis, lumbar region without neurogenic claudication: Secondary | ICD-10-CM | POA: Diagnosis not present

## 2024-05-25 DIAGNOSIS — E7800 Pure hypercholesterolemia, unspecified: Secondary | ICD-10-CM | POA: Diagnosis not present

## 2024-05-25 LAB — COLOGUARD: COLOGUARD: NEGATIVE

## 2024-06-16 ENCOUNTER — Other Ambulatory Visit: Payer: Self-pay | Admitting: Infectious Diseases

## 2024-06-16 ENCOUNTER — Telehealth: Payer: Self-pay | Admitting: *Deleted

## 2024-06-16 DIAGNOSIS — B20 Human immunodeficiency virus [HIV] disease: Secondary | ICD-10-CM

## 2024-06-16 DIAGNOSIS — Z113 Encounter for screening for infections with a predominantly sexual mode of transmission: Secondary | ICD-10-CM

## 2024-06-16 DIAGNOSIS — Z79899 Other long term (current) drug therapy: Secondary | ICD-10-CM

## 2024-06-16 NOTE — Telephone Encounter (Signed)
 Called pt about labs- no answer; left message of office's return call. She's a pt of Dr Eben.

## 2024-06-16 NOTE — Telephone Encounter (Signed)
 Called pt - no answer; left message on vm to call the office to schedule a lab appt

## 2024-06-16 NOTE — Telephone Encounter (Signed)
 Copied from CRM [id number]. Topic: Clinical - Request for Lab/Test Order >> Jun 16, 2024  9:12 AM Merlynn LABOR wrote: Reason for CRM: Patient called in requesting order to be placed for lab work. Please place order for patient.

## 2024-06-24 DIAGNOSIS — M48061 Spinal stenosis, lumbar region without neurogenic claudication: Secondary | ICD-10-CM | POA: Diagnosis not present

## 2024-06-24 DIAGNOSIS — Z79899 Other long term (current) drug therapy: Secondary | ICD-10-CM | POA: Diagnosis not present

## 2024-07-09 ENCOUNTER — Other Ambulatory Visit (INDEPENDENT_AMBULATORY_CARE_PROVIDER_SITE_OTHER)

## 2024-07-09 ENCOUNTER — Other Ambulatory Visit (HOSPITAL_COMMUNITY)
Admission: RE | Admit: 2024-07-09 | Discharge: 2024-07-09 | Disposition: A | Discharge status: Home / Self Care | Source: Ambulatory Visit | Attending: Infectious Diseases | Admitting: Infectious Diseases

## 2024-07-09 DIAGNOSIS — Z113 Encounter for screening for infections with a predominantly sexual mode of transmission: Secondary | ICD-10-CM | POA: Diagnosis present

## 2024-07-09 DIAGNOSIS — Z79899 Other long term (current) drug therapy: Secondary | ICD-10-CM

## 2024-07-09 DIAGNOSIS — B20 Human immunodeficiency virus [HIV] disease: Secondary | ICD-10-CM

## 2024-07-10 LAB — LIPID PANEL
Chol/HDL Ratio: 2.7 ratio (ref 0.0–4.4)
Cholesterol, Total: 138 mg/dL (ref 100–199)
HDL: 52 mg/dL (ref >39)
LDL Chol Calc (NIH): 66 mg/dL (ref 0–99)
Triglycerides: 111 mg/dL (ref 0–149)
VLDL Cholesterol Cal: 20 mg/dL (ref 5–40)

## 2024-07-10 LAB — COMPREHENSIVE METABOLIC PANEL WITH GFR
ALT: 14 IU/L (ref 0–32)
AST: 14 IU/L (ref 0–40)
Albumin: 4.2 g/dL (ref 3.8–4.9)
Alkaline Phosphatase: 148 IU/L — ABNORMAL HIGH (ref 49–135)
BUN/Creatinine Ratio: 10 (ref 9–23)
BUN: 11 mg/dL (ref 6–24)
Bilirubin Total: 0.3 mg/dL (ref 0.0–1.2)
CO2: 26 mmol/L (ref 20–29)
Calcium: 10.1 mg/dL (ref 8.7–10.2)
Chloride: 102 mmol/L (ref 96–106)
Creatinine, Ser: 1.08 mg/dL — ABNORMAL HIGH (ref 0.57–1.00)
Globulin, Total: 2.9 g/dL (ref 1.5–4.5)
Glucose: 85 mg/dL (ref 70–99)
Potassium: 3.3 mmol/L — ABNORMAL LOW (ref 3.5–5.2)
Sodium: 143 mmol/L (ref 134–144)
Total Protein: 7.1 g/dL (ref 6.0–8.5)
eGFR: 62 mL/min/1.73 (ref >59)

## 2024-07-10 LAB — CBC
Hematocrit: 39.1 % (ref 34.0–46.6)
Hemoglobin: 12.4 g/dL (ref 11.1–15.9)
MCH: 29.1 pg (ref 26.6–33.0)
MCHC: 31.7 g/dL (ref 31.5–35.7)
MCV: 92 fL (ref 79–97)
Platelets: 223 x10E3/uL (ref 150–450)
RBC: 4.26 x10E6/uL (ref 3.77–5.28)
RDW: 13.7 % (ref 11.7–15.4)
WBC: 5.5 x10E3/uL (ref 3.4–10.8)

## 2024-07-10 LAB — URINE CYTOLOGY ANCILLARY ONLY
Chlamydia: NEGATIVE
Comment: NEGATIVE
Comment: NORMAL
Neisseria Gonorrhea: NEGATIVE

## 2024-07-10 LAB — RPR: RPR Ser Ql: NONREACTIVE

## 2024-07-11 LAB — HIV-1 RNA QUANT-NO REFLEX-BLD: HIV-1 RNA Viral Load: <20 {copies}/mL

## 2024-07-13 ENCOUNTER — Encounter: Payer: Self-pay | Admitting: Radiology

## 2024-07-13 LAB — T-HELPER CELL (CD4) - (RCID CLINIC ONLY)
CD4 % Helper T Cell: 41 % (ref 33–65)
CD4 T Cell Abs: 994 /uL (ref 400–1790)

## 2024-08-11 ENCOUNTER — Ambulatory Visit: Admitting: Infectious Diseases

## 2024-08-11 VITALS — BP 126/82 | HR 76 | Temp 98.8°F | Ht 61.0 in | Wt 182.0 lb

## 2024-08-11 DIAGNOSIS — R238 Other skin changes: Secondary | ICD-10-CM | POA: Diagnosis not present

## 2024-08-11 DIAGNOSIS — Z83438 Family history of other disorder of lipoprotein metabolism and other lipidemia: Secondary | ICD-10-CM

## 2024-08-11 DIAGNOSIS — Z23 Encounter for immunization: Secondary | ICD-10-CM

## 2024-08-11 DIAGNOSIS — B20 Human immunodeficiency virus [HIV] disease: Secondary | ICD-10-CM | POA: Diagnosis not present

## 2024-08-11 DIAGNOSIS — E785 Hyperlipidemia, unspecified: Secondary | ICD-10-CM | POA: Diagnosis not present

## 2024-08-11 DIAGNOSIS — Z79899 Other long term (current) drug therapy: Secondary | ICD-10-CM | POA: Diagnosis not present

## 2024-08-11 DIAGNOSIS — Z113 Encounter for screening for infections with a predominantly sexual mode of transmission: Secondary | ICD-10-CM

## 2024-08-11 NOTE — Assessment & Plan Note (Addendum)
 She is doing well Will recheck her K+ Prev undetectable Does not want COVID/flu vax this year.  She will get PAP Needs PCV 20 Rtc in 9 months

## 2024-08-11 NOTE — Assessment & Plan Note (Signed)
 Will send her to derm Granular tumor?
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
## 2024-08-11 NOTE — Assessment & Plan Note (Signed)
 Doing well with lipitor     Latest Ref Rng & Units 07/09/2024   10:01 AM 09/13/2023   10:22 AM 11/06/2022    9:18 AM  Hepatic Function  Total Protein 6.0 - 8.5 g/dL 7.1  7.3  7.1   Albumin 3.8 - 4.9 g/dL 4.2  4.6  4.5   AST 0 - 40 IU/L 14  15  12    ALT 0 - 32 IU/L 14  13  12    Alk Phosphatase 49 - 135 IU/L 148  193  127   Total Bilirubin 0.0 - 1.2 mg/dL 0.3  0.2  0.3    Lipid Panel     Component Value Date/Time   CHOL 138 07/09/2024 1001   TRIG 111 07/09/2024 1001   HDL 52 07/09/2024 1001   CHOLHDL 2.7 07/09/2024 1001   CHOLHDL 2.7 09/27/2020 1041   VLDL 21 06/13/2016 1100   LDLCALC 66 07/09/2024 1001   LDLCALC 66 09/27/2020 1041   LABVLDL 20 07/09/2024 1001

## 2024-08-12 LAB — BASIC METABOLIC PANEL WITH GFR
BUN/Creatinine Ratio: 14 (ref 9–23)
BUN: 14 mg/dL (ref 6–24)
CO2: 23 mmol/L (ref 20–29)
Calcium: 9.7 mg/dL (ref 8.7–10.2)
Chloride: 106 mmol/L (ref 96–106)
Creatinine, Ser: 1.02 mg/dL — ABNORMAL HIGH (ref 0.57–1.00)
Glucose: 110 mg/dL — ABNORMAL HIGH (ref 70–99)
Potassium: 4.2 mmol/L (ref 3.5–5.2)
Sodium: 145 mmol/L — ABNORMAL HIGH (ref 134–144)
eGFR: 67 mL/min/1.73 (ref >59)

## 2024-08-24 ENCOUNTER — Telehealth: Payer: Self-pay | Admitting: *Deleted

## 2024-08-24 NOTE — Telephone Encounter (Signed)
 Called pt about her concerns about medication.  Saw a new back doctor who told her that she has really bad kidneys. 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 However this is unusual.  I reassured her that this is not the case for her- her Cr has been stable for 10 years.  No change in her ART.  She read me her plain film of her [facility]spine from [facility]- spondylsis, loss of height, osteophytes.  Thoracic spine film (-) Lumbar (-) Suggested she does not need surgery unless she has a change in her symptoms or any progression.

## 2024-08-24 NOTE — Telephone Encounter (Signed)
 Copied from CRM #[id number]. Topic: Clinical - Medication Question >> Aug 21, 2024  1:59 PM Danielle [name] wrote: Reason for CRM: Patient is calling in requesting a call back due to her back doctor telling the patient the medication Dr. Eben has her on is causing her kidney levels to be abnormal. Patient would like to discuss with the doctor.

## 2024-09-08 ENCOUNTER — Other Ambulatory Visit: Payer: Self-pay | Admitting: Infectious Diseases

## 2024-09-08 DIAGNOSIS — B20 Human immunodeficiency virus [HIV] disease: Secondary | ICD-10-CM

## 2025-03-31 ENCOUNTER — Ambulatory Visit: Admitting: Physician Assistant
